# Patient Record
Sex: Male | Born: 1995 | Race: Black or African American | Marital: Single | Smoking: Never smoker
Health system: Northeastern US, Academic
[De-identification: ages and names within clinical notes are randomized; demographics above are authoritative.]

## PROBLEM LIST (undated history)

## (undated) DIAGNOSIS — K8020 Calculus of gallbladder without cholecystitis without obstruction: Secondary | ICD-10-CM

---
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
## 2016-01-17 ENCOUNTER — Other Ambulatory Visit: Payer: Self-pay

## 2016-01-17 MED ORDER — OXYCODONE HCL 10 MG PO TABS *I*
10.0000 mg | ORAL_TABLET | Freq: Two times a day (BID) | ORAL | 0 refills | Status: DC | PRN
Start: 2016-01-17 — End: 2016-02-16

## 2016-02-16 ENCOUNTER — Other Ambulatory Visit: Payer: Self-pay

## 2016-02-16 DIAGNOSIS — D571 Sickle-cell disease without crisis: Secondary | ICD-10-CM

## 2016-02-16 DIAGNOSIS — D564 Hereditary persistence of fetal hemoglobin [HPFH]: Secondary | ICD-10-CM

## 2016-02-16 MED ORDER — OXYCODONE HCL 10 MG PO TABS *I*
10.0000 mg | ORAL_TABLET | Freq: Two times a day (BID) | ORAL | 0 refills | Status: DC | PRN
Start: 2016-02-16 — End: 2016-03-14

## 2016-02-29 ENCOUNTER — Ambulatory Visit: Payer: Self-pay | Admitting: Pediatric Hematology and Oncology

## 2016-03-14 ENCOUNTER — Ambulatory Visit: Payer: Self-pay | Admitting: Pediatric Hematology and Oncology

## 2016-03-14 ENCOUNTER — Encounter: Payer: Self-pay | Admitting: Pediatric Hematology and Oncology

## 2016-03-14 VITALS — BP 116/75 | HR 60 | Temp 98.4°F | Resp 16 | Ht 73.07 in | Wt 196.4 lb

## 2016-03-14 DIAGNOSIS — D571 Sickle-cell disease without crisis: Secondary | ICD-10-CM

## 2016-03-14 MED ORDER — OXYCODONE HCL 10 MG PO TABS *I*
10.0000 mg | ORAL_TABLET | Freq: Two times a day (BID) | ORAL | 0 refills | Status: DC | PRN
Start: 2016-03-14 — End: 2016-04-11

## 2016-03-14 NOTE — Progress Notes (Addendum)
Pediatric Hematology/Oncology Clinic Visit     CC: 20 y.o. man with HbS-high F Sickle Cell Disease here for follow up of disease    HPI: Samwise III [name] is a 20 y.o. male with SCD (HbS-high F disease) who underwent cholecystectomy in 2014. He continues to have intermittent knee pain after activities for which that he has been taking ibuprofen and using hot packs first to see if it helps relieve the pain then resorts to oxycodone if those interventions do not work. He has been trying this new pain regimen the last 3 months and he states that it is working and helping his intermittent knee pain.  No constipation, fevers, or priapism.    ROS:  Const- no fevers  HEENT- no URI symptoms.   CV- no chest pain  Pulm- no cough or SOB  GI- No nausea, vomiting, diarrhea. 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  He just graduated from [employer]. He wants become an Art gallery managerengineer although he is not fond of math. Family history notable for brothers and sister with sickle cell disease.     Medication Sig Start Date   ibuprofen (ADVIL,MOTRIN) 600 MG tablet Take 1 tablet (600 mg total) by mouth 4 times daily as needed for Pain 04/18/15   oxyCODONE (ROXICODONE) 10 MG immediate release tablet Take 1 tablet (10 mg total) by mouth every 12 hours as needed for Pain   Max daily dose: 20 mg 04/18/15   folic acid (FOLVITE) 1 MG tablet Take 1 tablet (1 mg total) by mouth daily 03/17/15   clindamycin-benzoyl peroxide (BENZACLIN) gel Apply topically 2 times daily 08/16/14     Objective:  Vitals:   BP 116/75  Pulse 60  Temp 36.9 C (98.4 F) (Temporal)   Resp 16  Ht 185.6 cm  (6' 1.07")  Wt 89.1 kg (196 lb 6.9 oz)  SpO2 100%  BMI 25.87 kg/m2    ZOX:WRUEBMI:Body mass index is 25.87 kg/(m^2).  BMI Percentile:84 %ile (Z= 0.97) based on CDC 2-20 Years BMI-for-age data using vitals from 03/14/2016.  Weight Percentile: 92 %ile (Z= 1.42) based on CDC 2-20 Years weight-for-age data using vitals from 03/14/2016.  GENERAL: well appearing in no acute distress  HEENT: Normocephalic, atraumatic, extraocular movements intact, anicteric sclera, mucous membranes moist, no oral lesions  Neck: supple  CV: S1, S2 regular rate and rhythm without murmur  Resp: clear to auscultation bilaterally without wheezes, rales, rhonchi.  GI: Soft, nontender, nondistended, without organomegaly or masses.  GU: Tanner V male with descended testes bilaterally  Skin: no rash  Extremities warm, well perfused  MSK: normal bulk and tone    Laboratory Results: none today    Assessment/Plan: 20 y.o. boy with HbSS with high F with stable frequency of pain episodes. Pain may or may not be exacerbated by sickle cell disease. [place] to visit family.     1. Recurrent pain: has been using more NSAIDs for his knee pain which is helping with the  pain. He states he has been applying warm packs to his knee as well. Only uses the oxycodone if other two interventions do not work.     2. Sickle cell maintenance:   Received flu shot in September 2016 and immunizations are otherwise up to date.   Needs ophthalmology evaluation. Mom is agreeable to him being seen August/ September 2017.  Stressed that sickle retinopathy can lead to blindness unless caught early.    Discussed with mom for signs and symptoms to call hem/onc for.     Follow up in clinic in 3 months or sooner as needed.     Godfrey Pick, NP   Pediatric Hematology Oncology     Attending addendum:  I saw and examined the patient.  I agree with the findings as documented above.  I discussed the proposed plan with the FNP and the  family.   My additions are as follows: Raun is an 20yo man with HbSS with high fetal hemoglobin who presents in follow up. Mild symptoms with oxycodone use no more than 1 per day. Trip for [place] this summer after graduation. Hasn't decided where he wants to go for college. Still hasn't seen ophthalmology. Exam is reassuring. Discussed need for ophtho appt. Also discussed some differences in patient care in ED in pediatrics versus adult medicine. Mom to make appt for ophtho after their return from [place]. Refill for oxycodone provided. Stressed that if he goes to college out of town, I'd like to help him know where to access medical care if necessary. RTC 3 months at which time labs will be due.    Vista Deck, MD

## 2016-04-11 ENCOUNTER — Other Ambulatory Visit: Payer: Self-pay

## 2016-04-11 MED ORDER — OXYCODONE HCL 10 MG PO TABS *I*
10.0000 mg | ORAL_TABLET | Freq: Two times a day (BID) | ORAL | 0 refills | Status: DC | PRN
Start: 2016-04-11 — End: 2016-05-09

## 2016-04-18 ENCOUNTER — Ambulatory Visit: Payer: Self-pay | Admitting: Ophthalmology

## 2016-04-30 ENCOUNTER — Encounter: Payer: Self-pay | Admitting: Pediatric Endocrinology

## 2016-04-30 ENCOUNTER — Emergency Department
Admission: EM | Admit: 2016-04-30 | Disposition: A | Discharge status: Home / Self Care | Payer: Self-pay | Source: Ambulatory Visit | Attending: Pediatrics | Admitting: Pediatrics

## 2016-04-30 LAB — BASIC METABOLIC PANEL
Anion Gap: 13 (ref 7–16)
CO2: 25 mmol/L (ref 20–28)
Calcium: 9.4 mg/dL (ref 9.3–10.5)
Chloride: 101 mmol/L (ref 96–108)
Creatinine: 0.87 mg/dL (ref 0.50–1.00)
GFR,Black: 145 *
GFR,Caucasian: 125 *
Glucose: 105 mg/dL — ABNORMAL HIGH (ref 60–99)
Lab: 9 mg/dL (ref 6–20)
Potassium: 4 mmol/L (ref 3.3–5.1)
Sodium: 139 mmol/L (ref 133–145)

## 2016-04-30 LAB — CBC AND DIFFERENTIAL
Baso # K/uL: 0.1 10*3/uL (ref 0.0–0.1)
Basophil %: 0.7 %
Eos # K/uL: 0.3 10*3/uL (ref 0.0–0.5)
Eosinophil %: 3.5 %
Hematocrit: 31 % — ABNORMAL LOW (ref 40–51)
Hemoglobin: 10.9 g/dL — ABNORMAL LOW (ref 13.7–17.5)
IMM Granulocytes #: 0 10*3/uL (ref 0.0–0.1)
IMM Granulocytes: 0.2 %
Lymph # K/uL: 2.4 10*3/uL (ref 1.3–3.6)
Lymphocyte %: 28.5 %
MCH: 31 pg/cell (ref 26–32)
MCHC: 36 g/dL (ref 32–37)
MCV: 88 fL (ref 79–92)
Mono # K/uL: 0.7 10*3/uL (ref 0.3–0.8)
Monocyte %: 7.8 %
Neut # K/uL: 5 10*3/uL (ref 1.8–5.4)
Nucl RBC # K/uL: 0 10*3/uL (ref 0.0–0.0)
Nucl RBC %: 0 /100 WBC (ref 0.0–0.2)
Platelets: 233 10*3/uL (ref 150–330)
RBC: 3.5 MIL/uL — ABNORMAL LOW (ref 4.6–6.1)
RDW: 16.1 % — ABNORMAL HIGH (ref 11.6–14.4)
Seg Neut %: 59.3 %
WBC: 8.4 10*3/uL (ref 4.2–9.1)

## 2016-04-30 LAB — RUQ PANEL (ED ONLY)
ALT: 38 U/L (ref 0–50)
AST: 38 U/L (ref 0–50)
Albumin: 4.3 g/dL (ref 3.5–5.2)
Alk Phos: 75 U/L (ref 65–260)
Amylase: 91 U/L (ref 28–100)
Bili,Indirect: 1.8 mg/dL — ABNORMAL HIGH (ref 0.1–1.0)
Bilirubin,Direct: 0.4 mg/dL — ABNORMAL HIGH (ref 0.0–0.3)
Bilirubin,Total: 2.2 mg/dL — ABNORMAL HIGH (ref 0.0–1.2)
Lipase: 17 U/L (ref 13–60)
Total Protein: 8 g/dL — ABNORMAL HIGH (ref 6.3–7.7)

## 2016-04-30 LAB — RBC MORPHOLOGY

## 2016-04-30 LAB — BLOOD BANK HOLD LAVENDER

## 2016-04-30 LAB — RETICULOCYTES
Retic %: 5.5 % — ABNORMAL HIGH (ref 0.9–1.5)
Retic Abs: 193 10*3/uL — ABNORMAL HIGH (ref 41.6–65.1)

## 2016-04-30 LAB — BLOOD BANK HOLD RED

## 2016-04-30 MED ORDER — IBUPROFEN 200 MG PO TABS *I*
ORAL_TABLET | ORAL | Status: AC
Start: 2016-04-30 — End: 2016-04-30
  Administered 2016-04-30: 600 mg via ORAL
  Filled 2016-04-30: qty 3

## 2016-04-30 MED ORDER — IBUPROFEN 200 MG PO TABS *I*
600.0000 mg | ORAL_TABLET | Freq: Once | ORAL | Status: AC
Start: 2016-04-30 — End: 2016-04-30

## 2016-04-30 MED ORDER — HYDROMORPHONE HCL PF 1 MG/ML IJ SOLN *WRAPPED*
INTRAMUSCULAR | Status: AC
Start: 2016-04-30 — End: 2016-04-30
  Administered 2016-04-30: 1 mg via INTRAVENOUS
  Filled 2016-04-30: qty 1

## 2016-04-30 MED ORDER — SODIUM CHLORIDE 0.9 % IV BOLUS *I*
1000.0000 mL | Freq: Once | Status: AC
Start: 2016-04-30 — End: 2016-04-30
  Administered 2016-04-30: 1000 mL via INTRAVENOUS

## 2016-04-30 MED ORDER — D5W & 0.45% NACL IV SOLN *I*
100.0000 mL/h | INTRAVENOUS | Status: DC
Start: 2016-04-30 — End: 2016-04-30
  Administered 2016-04-30: 100 mL/h via INTRAVENOUS

## 2016-04-30 MED ORDER — HYDROMORPHONE HCL PF 1 MG/ML IJ SOLN *WRAPPED*
1.0000 mg | INTRAMUSCULAR | Status: DC | PRN
Start: 2016-04-30 — End: 2016-04-30
  Administered 2016-04-30: 1 mg via INTRAVENOUS
  Filled 2016-04-30: qty 1

## 2016-04-30 NOTE — ED Triage Notes (Signed)
Patient arrived to ED with mom with complaints of chest pain for past couple hours.Patient has PMH of sickle cell per mom.Patient took 10mg  oxycodone without relief.Patient remains tolerating PO well at home. Patient states he may have had fever but has not taken temperature. Patient in 8/10chest pain currently.  Patient alert and oriented x4, holding chest with chest pain, VSS, MMM  Patient placed on full cardiac monitoring-NSR  SPO2- 99%RA       Triage Note   Flonnie OvermanNicholas Saahas Hidrogo, RN

## 2016-04-30 NOTE — Discharge Instructions (Signed)
Your child was seen in the emergency department for chest pain. His pain was controlled with IV pain medication and his labs were reassuring that he did not require hospitalization. He was discharged to home.    Please contact his hematology/oncology clinic to inquire about whether he needs to be seen for a follow up appointment.    He can take his oxycodone as prescribed if his pain returns. You can also take ibuprofen  every 6 hours.    Return to the ED for evaluation if your pain is not controlled by the above medications, if you have fever, or if you have any shortness of breath.

## 2016-04-30 NOTE — ED Provider Notes (Addendum)
History     Chief Complaint   Patient presents with    Chest Pain    Sickle Cell Pain Crisis     HPI Comments: Cameron [name] is a 20 y.o. male with history of sickle cell disease with HPFH, who presents with chest pain.  He states that he has had increasing chest pain over the past 2 days, which is currently 8/10 in severity, sharp in character, located over his mid chest and present also in the middle of his back.  His pain is increased with moving and twisting, he has taken oxycodone 10 mg at home without improvement.  He denies shortness of breath, lightheadedness, abdominal pain.  He denies recent fever, cough, nasal congestion, rhinorrhea, sore throat, ear pain, headache, shortness of breath, nausea, vomiting, diarrhea, new rash.  He has been sedentary yesterday and today due to his pain, previously he was not engaged in any particularly strenuous activity and has been eating and drinking normally.       History provided by:  Patient, parent and medical records  Language interpreter used: No    Is this ED visit related to civilian activity for income:  Not work related    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        Past Surgical History:   Procedure Laterality Date    CHOLECYSTECTOMY       Family History   Problem Relation Age of Onset    Other Mother     Sickle cell anemia Sister     Sickle cell anemia Brother     Sickle cell anemia Brother        Social History    reports that he has never smoked. He has never used smokeless tobacco. He reports that he does not drink alcohol or use illicit drugs. His sexual activity history is not on file.    Living Situation     Questions Responses    Patient lives with Family    Homeless No    Caregiver for other family member     External Services     Employment     Domestic Violence Risk           Problem List     Patient Active Problem List   Diagnosis Code    Sickle cell disease with HPFH  D57.80    Back pain M54.9       Review of Systems   Review of Systems   Constitutional: Negative for fever.   HENT: Negative for congestion and sore throat.    Eyes: Negative for visual disturbance.   Respiratory: Negative for cough and shortness of breath.    Cardiovascular: Positive for chest pain.   Gastrointestinal: Negative for abdominal pain, diarrhea, nausea and vomiting.   Genitourinary: Negative for dysuria and hematuria.   Musculoskeletal: Negative for neck pain and neck stiffness.   Skin: Negative for rash.   Allergic/Immunologic: Positive for immunocompromised state (sickle cell disease).   Neurological: Negative for syncope.       Physical Exam     ED Triage Vitals   BP Heart Rate Heart Rate (via Pulse Ox) Resp Temp Temp src SpO2 O2 Device O2 Flow Rate   04/30/16 0018 04/30/16 0018 04/30/16 0040 04/30/16 0018 04/30/16 0018 04/30/16 0018 04/30/16 0018 04/30/16 0018 --   133/76 87 83 18 36.5 C (97.7 F) Oral 99 % None (Room  air)       Weight           04/30/16 0018           92.8 kg (204 lb 9.4 oz)                    Physical Exam   Constitutional: He is oriented to person, place, and time. He appears well-developed and well-nourished. No distress.   HENT:   Head: Normocephalic.   Mouth/Throat: Oropharynx is clear and moist.   Eyes: Conjunctivae are normal. No scleral icterus.   Neck: Normal range of motion.   Cardiovascular: Normal rate, regular rhythm, normal heart sounds and intact distal pulses.  Exam reveals no gallop and no friction rub.    No murmur heard.  Pulmonary/Chest: Effort normal and breath sounds normal. No respiratory distress. He has no wheezes. He has no rales. He exhibits tenderness (diffuse).   Abdominal: Soft. Bowel sounds are normal. There is no tenderness.   Musculoskeletal: Normal range of motion.   Neurological: He is alert and oriented to person, place, and time.   Skin: Skin is warm and dry. He is not diaphoretic.   Psychiatric: He has a normal mood and affect. His behavior  is normal.   Nursing note and vitals reviewed.      Medical Decision Making        Initial Evaluation:  ED First Provider Contact     Date/Time Event User Comments    04/30/16 0022 ED Provider First Contact Raj Janus Initial Face to Face Provider Contact          Patient seen by me on arrival date of 04/30/2016 at at time of arrival  12:10 AM.  Initial face to face evaluation time noted above may be discrepant due to patient acuity and delay in documentation.    Assessment:  19 y.o.male comes to the ED with chest pain. He  is afebrile, hemodynamically stable and in no acute distress. He is not hypoxic, has no dyspnea. Likelihood of acute chest syndrome is very low. His pain reproduces with palpation of the chest wall and is likely due to vaso-occlusion. He has no identifiable precipitating cause. We will obtain labs and CXR, and treat his pain. Disposition will be determined depending on his laboratory and imaging workup as well as his response to therapy.    Differential Diagnosis includes vaso-occlusive crisis, acute chest syndrome, pneumonia, pneumothorax, musculoskeletal pain, trauma    Plan:   Orders Placed This Encounter   Procedures    *Chest standard frontal and lateral views    CBC and differential    Reticulocytes    Blood bank hold red    Blood bank hold lavender    Basic metabolic panel    RUQ panel (ED only)    RBC morphology    [facility] telemetry continuous until discontinued    Insert peripheral IV   Dispo pending results of workup and response to therapy    Henderson Baltimore, MD    Henderson Baltimore, MD  Resident  04/30/16 0246  Resident Attestation:     Patient seen by me today, 04/30/2016 at 00:29am.    History:   I reviewed this patient, reviewed the resident's note and agree.  Exam:   I examined this patient, reviewed the resident's note and agree.    Decision Making:   I discussed with the resident his/her documented decision making  and agree.    Will provide IV  hydration, IV pain  control, also Ibuprofen.        Author Markus DaftElizabeth Melyna Huron, DO       Markus DaftMurray, Jadien Lehigh, [place]DO  04/30/16 [phone]

## 2016-04-30 NOTE — ED Notes (Addendum)
Pt presents to ED with mom for chest pain that began around 1500. Pt has history of sickle cell and was last hospitalized 2 years ago. Pt states when the pain began he took a nap and woke up and the pain was worse. Pt mother gave him 10mg  of oxycodone with no effect he was able to eat and drink regularly. Pt states he has headache and had 8/10 chest pain before the dilaudid, and now has chest pain of 5/10. Pt denies any difficulty breathing, nausea or vomiting. Pt appears to be in pain and fatigued while sitting in bed. Lung sounds are CTA and abdomen is flat, non-distended and non-tender. See nursing assessment for full review of systems.     I, Vladimir CroftsAlyssa Marshella Tello RN, agree with the above documentation, pt on continuous telemetry and oxymetry.   Plan: medications, labs, imaging per orders, education as needed, comfort measures, writer will continue to treat per provider orders.

## 2016-04-30 NOTE — ED Provider Progress Notes (Signed)
ED Provider Progress Note    ED Course   Comment By Time   Patient reassessed, endorses no respiratory problems.  He states that his pain had improved after his first Dilaudid dose, but has since increased back to 8/10.  We will provide another dose of Dilaudid and provide ibuprofen as well, reassess. Henderson BaltimoreJesson, Raylyn Speckman Ronald, MD 08/07 0247   Pain currently 4/10, no dyspnea. Will start maintenance fluids and observe, allow patient to rest. Henderson BaltimoreJesson, Fredrika Canby Ronald, MD 08/07 [phone]     Patient signed out to overnight team pending reassessment.       Henderson BaltimoreAndrew Ronald Taelyn Nemes, MD, 04/30/2016, 2:46 AM         Henderson BaltimoreJesson, Karley Pho Ronald, MD  Resident  04/30/16 [phone]

## 2016-05-09 ENCOUNTER — Other Ambulatory Visit: Payer: Self-pay

## 2016-05-09 MED ORDER — OXYCODONE HCL 10 MG PO TABS *I*
10.0000 mg | ORAL_TABLET | Freq: Two times a day (BID) | ORAL | 0 refills | Status: DC | PRN
Start: 2016-05-09 — End: 2016-06-08

## 2016-05-10 ENCOUNTER — Other Ambulatory Visit: Payer: Self-pay

## 2016-05-10 MED ORDER — IBUPROFEN 600 MG PO TABS *I*
600.0000 mg | ORAL_TABLET | Freq: Four times a day (QID) | ORAL | 3 refills | Status: DC | PRN
Start: 2016-05-10 — End: 2017-01-02

## 2016-06-08 ENCOUNTER — Other Ambulatory Visit: Payer: Self-pay

## 2016-06-08 MED ORDER — OXYCODONE HCL 10 MG PO TABS *I*
10.0000 mg | ORAL_TABLET | Freq: Two times a day (BID) | ORAL | 0 refills | Status: DC | PRN
Start: 2016-06-08 — End: 2016-07-11

## 2016-06-13 ENCOUNTER — Ambulatory Visit: Payer: Self-pay | Admitting: Pediatric Hematology and Oncology

## 2016-07-11 ENCOUNTER — Other Ambulatory Visit: Payer: Self-pay

## 2016-07-11 MED ORDER — OXYCODONE HCL 10 MG PO TABS *I*
10.0000 mg | ORAL_TABLET | Freq: Two times a day (BID) | ORAL | 0 refills | Status: DC | PRN
Start: 2016-07-11 — End: 2016-08-03

## 2016-07-25 ENCOUNTER — Ambulatory Visit: Payer: Self-pay | Admitting: Pediatric Hematology and Oncology

## 2016-08-03 ENCOUNTER — Other Ambulatory Visit: Payer: Self-pay

## 2016-08-03 MED ORDER — OXYCODONE HCL 10 MG PO TABS *I*
10.0000 mg | ORAL_TABLET | Freq: Two times a day (BID) | ORAL | 0 refills | Status: DC | PRN
Start: 2016-08-03 — End: 2016-09-07

## 2016-08-08 ENCOUNTER — Ambulatory Visit: Payer: Self-pay | Admitting: Pediatric Hematology and Oncology

## 2016-09-07 ENCOUNTER — Encounter: Payer: Self-pay | Admitting: Pediatric Hematology and Oncology

## 2016-09-07 ENCOUNTER — Ambulatory Visit: Payer: Self-pay | Admitting: Pediatric Hematology and Oncology

## 2016-09-07 VITALS — BP 129/81 | HR 112 | Temp 97.9°F | Resp 18 | Ht 72.52 in | Wt 186.9 lb

## 2016-09-07 DIAGNOSIS — D571 Sickle-cell disease without crisis: Secondary | ICD-10-CM

## 2016-09-07 DIAGNOSIS — D564 Hereditary persistence of fetal hemoglobin [HPFH]: Secondary | ICD-10-CM

## 2016-09-07 MED ORDER — OXYCODONE HCL 10 MG PO TABS *I*
10.0000 mg | ORAL_TABLET | Freq: Two times a day (BID) | ORAL | 0 refills | Status: DC | PRN
Start: 2016-09-07 — End: 2016-10-02

## 2016-09-07 MED ORDER — DIGITAL THERMOMETER MISC
0 refills | Status: DC
Start: 2016-09-07 — End: 2018-04-29

## 2016-09-07 MED ORDER — FOLIC ACID 1 MG PO TABS *I*
1.0000 mg | ORAL_TABLET | Freq: Every day | ORAL | 5 refills | Status: DC
Start: 2016-09-07 — End: 2017-02-11

## 2016-09-07 MED ORDER — DEXTROMETHORPHAN-GUAIFENESIN 30-600 MG PO TB12 *I*
1.0000 | ORAL_TABLET | Freq: Two times a day (BID) | ORAL | 0 refills | Status: DC | PRN
Start: 2016-09-07 — End: 2017-02-11

## 2016-09-07 NOTE — Progress Notes (Signed)
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 male with SCD (HbS-high F disease) who underwent cholecystectomy in 2014. He is recovering from a febrile illness with cough, vomiting, diarrhea. He no longer has fever or GI symptoms but has a lingering cough. He is drinking water and ginger ale. No recent pain crises.    ROS:  Const- no fevers  HEENT- no URI symptoms.   CV- no chest pain  Pulm- +cough but no SOB   GI- No nausea, vomiting, diarrhea. No abd pain at this time  GU- no change in UOP or dark urine, no priapism  Skin- no rashes or lesions  Msk- no muscle pain  Neuro- no headaches  Heme- no bruising or bleeding    Updated PMHx:  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     Past medical and surgical history, family and social history were reviewed and updated.  Currently working at [employer]. Family history notable for brothers and sister with sickle cell disease.     Medication Sig Start Date   folic acid (FOLVITE) 1 MG tablet Take 1 tablet (1 mg total) by mouth daily 09/07/16   oxyCODONE (ROXICODONE) 10 MG immediate release tablet Take 1 tablet (10 mg total) by mouth every 12 hours as needed for Pain   Max daily dose: 20 mg 09/07/16   ibuprofen (ADVIL,MOTRIN) 600 MG tablet Take 1 tablet (600 mg total) by mouth 4 times daily as needed for Pain 05/10/16   clindamycin-benzoyl peroxide (BENZACLIN) gel Apply topically 2 times daily 06/09/15       Objective:  Vitals:   BP 129/81   Pulse (!) 112   Temp 36.6 C (97.9 F) (Temporal)    Resp 18   Ht 184.2 cm (6' 0.52")   Wt 84.8 kg (186 lb 15.2 oz)   SpO2 100%   BMI 24.99 kg/m2    VQQ:VZDG mass index is 24.99 kg/(m^2).  BMI Percentile:75 %ile (Z= 0.69) based on CDC 2-20 Years BMI-for-age data using vitals from 09/07/2016.  Weight Percentile: 87 %ile (Z= 1.13) based on CDC  2-20 Years weight-for-age data using vitals from 09/07/2016.  GENERAL: well appearing in no acute distress  HEENT: Normocephalic, atraumatic, extraocular movements intact, anicteric sclera, mucous membranes moist, no oral lesions  Neck: supple  CV: S1, S2 regular rate and rhythm without murmur  Resp: clear to auscultation bilaterally without wheezes, rales, rhonchi.  GI: Soft, nontender, nondistended, without organomegaly or masses.  Skin: no rash  Extremities warm, well perfused  MSK: normal bulk and tone    Laboratory Results:       Lab results: 04/30/16  0041   WBC 8.4   Hemoglobin 10.9*   Hematocrit 31*   RBC 3.5*   Platelets 233   Seg Neut % 59.3   Lymphocyte % 28.5   Monocyte % 7.8   Eosinophil % 3.5   Basophil % 0.7     Lab Results   Component Value Date    RETAP 5.5 (H) 04/30/2016         Lab results: 04/30/16  0041   Sodium 139   Potassium 4.0   Chloride 101   CO2 25   UN 9   Creatinine 0.87   GFR,Caucasian 125  GFR,Black 145   Glucose 105*   Calcium 9.4   Total Protein 8.0*   Albumin 4.3   ALT 38   AST 38   Alk Phos 75   Bilirubin,Total 2.2*     Assessment/Plan: 20 y.o. boy with HbSS with high F here in follow up. URI. Tachycardia.  1. Intermittent VOC: Refilled oxycodone. Encouraged use of ibuprofen first for mild pain.  2. Transition: Discussed sickle cell inheritance, reviewed his baseline hemoglobin and type of sickle cell disease he has, as well as reviewed differences between adult and pediatric ED sickle cell care. He feels ready to transition to [facility] and will await a call from [facility] staff. In the interim, I will refill his meds and deal with acute sickle cell issues as they arise.  3. Sickle cell: Refilled folic acid. Very overdue for ophthalmology exam. [facility] staff may have better success with coordinating ophthalmology than I have. Flu shot given today.   4. Viral illness: May have mild dehydration from illness earlier this week - recommended fluids. Prescribed Mucinex but cautioned  him to call us if he has fevers. Prescribed thermometer as well.     Orlena Sheldon, MD

## 2016-09-19 ENCOUNTER — Telehealth: Payer: Self-pay | Admitting: Primary Care

## 2016-09-19 NOTE — Telephone Encounter (Signed)
Please call Jagjit to schedule a NPV in the next few months - transitioninng from peds heme onc - with me around spring

## 2016-09-25 NOTE — Telephone Encounter (Signed)
Patient scheduled on 4/10 at 2:30

## 2016-10-02 ENCOUNTER — Other Ambulatory Visit: Payer: Self-pay

## 2016-10-02 MED ORDER — OXYCODONE HCL 10 MG PO TABS *I*
10.0000 mg | ORAL_TABLET | Freq: Two times a day (BID) | ORAL | 0 refills | Status: DC | PRN
Start: 2016-10-02 — End: 2016-11-05

## 2016-10-05 ENCOUNTER — Telehealth: Payer: Self-pay | Admitting: Pediatrics

## 2016-10-05 NOTE — Telephone Encounter (Signed)
Pt scheduled for NPV with Dr. Concha NorwayPulcino on 4/10 at [facility].

## 2016-11-05 ENCOUNTER — Other Ambulatory Visit: Payer: Self-pay

## 2016-11-05 MED ORDER — OXYCODONE HCL 10 MG PO TABS *I*
10.0000 mg | ORAL_TABLET | Freq: Two times a day (BID) | ORAL | 0 refills | Status: DC | PRN
Start: 2016-11-05 — End: 2016-12-04

## 2016-12-04 ENCOUNTER — Other Ambulatory Visit: Payer: Self-pay

## 2016-12-04 MED ORDER — OXYCODONE HCL 10 MG PO TABS *I*
10.0000 mg | ORAL_TABLET | Freq: Two times a day (BID) | ORAL | 0 refills | Status: DC | PRN
Start: 2016-12-04 — End: 2017-01-01

## 2016-12-04 NOTE — Telephone Encounter (Signed)
This report was requested by: Lenell AntuJennifer Leigh Riki Gehring   Reference #: [id number]   Others' Prescriptions  Patient Name: Cameron BachelorLucius Inocencio Birth Date: 03-31-1996   Address: [address] [place], [place] [id number] Sex: Male   Rx Written Rx Dispensed Drug Quantity Days Supply Prescriber Name Payment Method Dispenser   11/05/2016 11/06/2016 oxycodone hcl 10 mg tablet  30 15 Diannia RuderNoronha, Suzie A (MD) [facility]   10/02/2016 10/02/2016 oxycodone hcl 10 mg tablet  30 15 Diannia RuderNoronha, Suzie A (MD) [facility]   09/07/2016 09/09/2016 oxycodone hcl 10 mg tablet  30 15 Diannia RuderNoronha, Suzie A (MD) [facility]

## 2016-12-07 ENCOUNTER — Ambulatory Visit: Payer: Self-pay | Admitting: Pediatric Hematology and Oncology

## 2017-01-01 ENCOUNTER — Other Ambulatory Visit: Payer: Self-pay

## 2017-01-01 ENCOUNTER — Ambulatory Visit: Payer: Medicaid Other | Attending: Primary Care | Admitting: Primary Care

## 2017-01-01 ENCOUNTER — Encounter: Payer: Self-pay | Admitting: Primary Care

## 2017-01-01 ENCOUNTER — Telehealth: Payer: Self-pay

## 2017-01-01 VITALS — BP 120/78 | HR 107 | Ht 72.52 in | Wt 190.3 lb

## 2017-01-01 DIAGNOSIS — D571 Sickle-cell disease without crisis: Secondary | ICD-10-CM

## 2017-01-01 DIAGNOSIS — M549 Dorsalgia, unspecified: Secondary | ICD-10-CM

## 2017-01-01 MED ORDER — OXYCODONE HCL 10 MG PO TABS *I*
10.0000 mg | ORAL_TABLET | Freq: Every day | ORAL | 0 refills | Status: DC
Start: 2017-01-01 — End: 2017-01-23

## 2017-01-01 NOTE — Progress Notes (Signed)
Family:  3 brothers 20 yo ([place]), 7 yo Salah Nakamura), 20 yo

## 2017-01-01 NOTE — Telephone Encounter (Signed)
Writer set-up transportation for patient via [employer].   Invoice #[id number].   Patient advised of pick up scheduled for 4/10 at 1:30 PM.

## 2017-01-02 ENCOUNTER — Telehealth: Payer: Self-pay | Admitting: Primary Care

## 2017-01-02 ENCOUNTER — Other Ambulatory Visit: Payer: Self-pay

## 2017-01-02 ENCOUNTER — Telehealth: Payer: Self-pay

## 2017-01-02 MED ORDER — IBUPROFEN 600 MG PO TABS *I*
600.0000 mg | ORAL_TABLET | Freq: Four times a day (QID) | ORAL | 3 refills | Status: DC | PRN
Start: 2017-01-02 — End: 2017-02-11

## 2017-01-02 MED ORDER — CLINDAMYCIN PHOS-BENZOYL PEROX 1-5 % EX GEL *A*
Freq: Two times a day (BID) | CUTANEOUS | 5 refills | Status: DC
Start: 2017-01-02 — End: 2017-04-30

## 2017-01-02 NOTE — Telephone Encounter (Signed)
PA for Oxycodone 10 mg tablets approved beginning 01/02/2017 through 04/03/2017. Pharmacy notified and script run successfully/ with a $0 co-pay.

## 2017-01-02 NOTE — Telephone Encounter (Signed)
Writer called and spoke with pt to let him know that he will need to have Dr Concha Norway order his medications from now on per Dr Valla Leaver. Pt mom called and requested medication refill yesterday from Dr Valla Leaver but pt saw Dr Concha Norway yesterday.      Pete Pelt, RN

## 2017-01-06 ENCOUNTER — Encounter: Payer: Self-pay | Admitting: Primary Care

## 2017-01-06 NOTE — Progress Notes (Signed)
[facility]  [address]  [place] [address]  Phone: [phone]  Fax: [phone]    REASON FOR VISIT   Sickle Cell Health Maintenance Visit     PRIMARY DIAGNOSIS   Sickle Cell disease, type HgSS    SUBJECTIVE   Current concerns:  None - Cameron [name] describes himself as generally healthy with 1-2 admissions per year and most recently underwent cholecystectomy   He is taking oxy IR for management of back and leg pain although this has not bothered him in some time    He has never been on HU and has not heard of the medication  Enjoys time with his borthers and family in free time - likes being with them     Social Determinants of Health:  Cameron [name] lives with his mother, sister and brother (nephew age 70 (sister's son) and 30 yo and 64 yo bothers ) in single family home. When feeling sick, he relies on his  Mother and brother for support. [name] accesses his pharmacy by walking and transport. 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      Current home pain management plan:  Non pharmacologic interventions: ibuprofen he identifies as taking prn for headaches only   Non opioid pain modifying medications: none  Short acting Opioid pain medications: oxy IR 10 mg Q 12 hr - he reports that was what he was told to take but has had no pain in back or legs in several weeks - feels he is taking them routinely "just in case"  Long acting Opioid pain medications: none    Current Pain score:   Pain    01/01/17 1426   PainSc:   0 - No pain     Emergency Room visits in last 6 month: 0  Hospitalizations in last 6 mo: 0    Current disease modifying therapy:none - high HgF witout medication tx    Medications Reviewed and changes     Current Outpatient  Prescriptions:     oxyCODONE (ROXICODONE) 10 MG immediate release tablet, Take 1 tablet (10 mg total) by mouth daily   Max daily dose: 10 mg, Disp: 14 tablet, Rfl: 0    folic acid (FOLVITE) 1 MG tablet, Take 1 tablet (1 mg total) by mouth daily, Disp: 30 tablet, Rfl: 5    dextromethorphan-guaifenesin (MUCINEX DM) 30-600 MG per 12 hr tablet, Take 1 tablet by mouth 2 times daily as needed for Congestion or Cough, Disp: 15 tablet, Rfl: 0    Electronic Thermometer (DIGITAL THERMOMETER) MISC, Use as directed, Disp: 1 each, Rfl: 0    ibuprofen (ADVIL,MOTRIN) 600 MG tablet, Take 1 tablet (600 mg total) by mouth 4 times daily as needed for Pain, Disp: 100 tablet, Rfl: 3    clindamycin-benzoyl peroxide (BENZACLIN) gel, Apply topically 2 times daily, Disp: 25 g, Rfl: 5  Review of Systems   Constitutional: Negative for activity change, appetite change, fatigue and fever.   Respiratory: Negative for cough, chest tightness and shortness of breath.    Gastrointestinal: Negative for constipation, nausea and vomiting.   Neurological: Negative for dizziness.   Psychiatric/Behavioral: Negative for dysphoric mood and sleep disturbance.     OBJECTIVE     BP 120/78  Pulse 107   Ht 1.842 m (6' 0.52")   Wt 86.3 kg (190 lb 4.8 oz)   SpO2 100%   BMI 25.44 kg/m2  Physical Exam   Constitutional: He appears well-developed and well-nourished. No distress.   Psychiatric: He has a normal mood and affect. His behavior is normal. Judgment and thought content normal.     ASSESSMENT / DIAGNOSIS   Augusta was seen today for new patient visit. To establish care with underlying dx of sickle cell disease with unique genetics of Hg SS with high HgF likely resulting in his fairly mild clinical course to date.  Good social supports and engaged in school, sports, and employment    Disease modifying therapy:  No inidcation for HU given underlying elevated HgF at 31% IN 2014.  Reviewed this with Elon and explained the impact of this elevation on  clinical sx     Optho:  Never has had eye screening, referral placed and epxained impact of SCD on eye    Iron overload: no history of blood transfusion in lifetime  start monitoring if required blood transfusion    AVN:  Current joint sx: none, some intermittant joinnt pain but denies hip, knee shoulder pain - discussed need for screening with x ray if finding recurrent crisis pain in these areas and discussed what AVN is and implications    Renal screening:  Last urine protein: No results found for: UTPR    Pulmonary screening: none, asymtpomatic     Cardiac Screening:  Current symptoms present of CHF: no  Last Echocardiogram:  none    Infection management plan:  - patient is functionally asplenic yes  -patient reminded of risk of infection given asplenia  -reminded to seek medical assistance immediately for fever (>38.3C or 100.39F )  -advised to call our clinic, PCP or other urgent medical care  -should be evaluated within 4 hours of fever    Immunizations:   Sickle Cell Immunizations     Name Date    MENINGOCOCCAL (Bexsero) Oct 26, 16    MENINGOCOCCAL (Bexsero) Sep 15, 16    MENINGOCOCCAL [facility]) Aug 19, 11 12:00 AM    MENINGOCOCCAL [facility]) Aug 12, 10 12:00 AM    MENINGOCOCCAL CONJUGATE MCV4P Jan 29, 16    MENINGOCOCCAL Polysaccharide Jul 25, 01 12:00 AM    PNEUMOCOCCAL 13(PREVNAR 13) Nov 23, 15    PNEUMOCOCCAL(PNEUMOVAX) Sep 15, 16    PNEUMOCOCCAL(PNEUMOVAX) Nov 28, 01          Oral Health:  Dental Exam every 6 months: last exam  Wisdom teeth status:    Ocular screening:  Last eye exam:   Findings:    Reproductive health:  Male:  HIV screening age 16-65   HIV 1&2 ANTIGEN/ANTIBODY   Date/Time Value Ref Range Status   06/09/2015 04:18 PM Nonreactive  Final     Comment:     Test Method: CMIA     Last CT screen:   Chlamydia Plasmid DNA Amplification   Date/Time Value Ref Range Status   06/09/2015 04:18 PM .  Final   No results found for: CLAMYDCU  Last GC screen: No results found for: G      Mental  Health:  Current BH screening:  Recent Review Flowsheet Data     PHQ-2/9 Scores 01/01/2017    PHQ9 Calculated Score 0        Referral made to behavioral health: No: no inidcation at this point, reviewed availability at [facility]    Pain  management plan of care:  Benefit outweighs Risk of opioid pain medications: no    Opportunity to wean opioid: yes  Agreed upon Treatment goals include:  Utilize when having pain only to avoid toleratence as able, taper Oxy IR to daily and then off and continue prn only      Urine Toxicology most recently collected on: none    No Patient Care Coordination Note on file.        Return in about 2 weeks (around 01/15/2017) for f/u pain med taper .    Electronically signed by Benay Pillow, MD 01/01/2017 1:57 PM  [facility], Phone: [phone]

## 2017-01-11 ENCOUNTER — Telehealth: Payer: Self-pay

## 2017-01-11 NOTE — Telephone Encounter (Signed)
[facility] has left two vm for pt to call them to schedule an appt.    [facility] [phone].

## 2017-01-23 ENCOUNTER — Telehealth: Payer: Self-pay | Admitting: Primary Care

## 2017-01-23 ENCOUNTER — Encounter: Payer: Self-pay | Admitting: Primary Care

## 2017-01-23 ENCOUNTER — Ambulatory Visit: Payer: Medicaid Other | Attending: Primary Care | Admitting: Primary Care

## 2017-01-23 VITALS — BP 132/66 | HR 120 | Ht 72.52 in | Wt 190.3 lb

## 2017-01-23 DIAGNOSIS — D564 Hereditary persistence of fetal hemoglobin [HPFH]: Secondary | ICD-10-CM

## 2017-01-23 DIAGNOSIS — D5700 Hb-SS disease with crisis, unspecified: Secondary | ICD-10-CM

## 2017-01-23 DIAGNOSIS — M549 Dorsalgia, unspecified: Secondary | ICD-10-CM

## 2017-01-23 MED ORDER — NALOXONE HCL 4 MG/0.1ML NA LIQD *I*
NASAL | 5 refills | Status: DC
Start: 2017-01-23 — End: 2023-10-07

## 2017-01-23 MED ORDER — OXYCODONE HCL 10 MG PO TABS *I*
10.0000 mg | ORAL_TABLET | ORAL | 0 refills | Status: DC | PRN
Start: 2017-01-23 — End: 2017-02-22

## 2017-01-23 NOTE — Progress Notes (Deleted)
[place]  [place] [address]  Phone: [phone]  Fax: [phone]    REASON FOR VISIT   Sickle Cell Health Maintenance Visit     PRIMARY DIAGNOSIS   Sickle Cell disease, type HgSS    SUBJECTIVE   Current concerns:  Last week was taking twice a day   Social Determinants of Health:  Social Determinants of Care    Current home pain management plan:  Non pharmacologic interventions: ***   Non opioid pain modifying medications: *** mg  Q *** hr  Short acting Opioid pain medications: oxy IR 10 mg QAM  Long acting Opioid pain medications: *** mg Q *** hr    PEG:  Prior (PEG) Pain Score:   Current Pain score:   Pain    01/23/17 1350   PainSc:   0 - No pain     Pain on average the last week: {gen number 0-27:253664}  Degree of interference with Enjoyment of life in last week: {gen number 4-03:474259}  How has pain interfered with your General activity in the last week: {gen number 5-63:875643}  PEG SCORE ***    Emergency Room visits since last visit: {gen number 3-29:518841}  Hospitalizations since last visit: {gen number 6-60:630160}    Current disease modifying therapy:{CCC Sickle Cell Dis Mod Therapy:28945}    Medications Reviewed and changes     Current Outpatient Prescriptions:     ibuprofen (ADVIL,MOTRIN) 600 MG tablet, Take 1 tablet (600 mg total) by mouth 4 times daily as needed for Pain, Disp: 100 tablet, Rfl: 3    clindamycin-benzoyl peroxide (BENZACLIN) gel, Apply topically 2 times daily, Disp: 25 g, Rfl: 5    oxyCODONE (ROXICODONE) 10 MG immediate release tablet, Take 1 tablet (10 mg total) by mouth daily   Max daily dose: 10 mg, Disp: 14 tablet, Rfl: 0    folic acid (FOLVITE) 1 MG tablet, Take 1 tablet (1 mg total) by mouth daily, Disp: 30 tablet, Rfl: 5    dextromethorphan-guaifenesin (MUCINEX DM) 30-600 MG per 12 hr tablet, Take 1 tablet by mouth 2 times daily as needed for Congestion or Cough, Disp: 15 tablet, Rfl: 0    Electronic Thermometer (DIGITAL  THERMOMETER) MISC, Use as directed, Disp: 1 each, Rfl: 0  Review of Systems  OBJECTIVE     BP 132/66   Pulse (!) 120   Ht 1.842 m (6' 0.52")   Wt 86.3 kg (190 lb 4.8 oz)   SpO2 93%   BMI 25.44 kg/m2  Physical Exam  ASSESSMENT / DIAGNOSIS   There are no diagnoses linked to this encounter.  Disease modifying therapy:  {CCC Sickle Cell Disease Modifying Therapy:29087}  {Hydroxyurea Discontinuation:29519}    Iron overload:       {CCC SC Iron/Ferriton Monitoring:29088}  Last echo:    Last LFT:    Lab results: 04/30/16  0041   Total Protein 8.0*   Albumin 4.3   ALT 38   AST 38   Alk Phos 75   Bilirubin,Total 2.2*   Bilirubin,Direct 0.4*       Last hepatic MRI: No procedure found.    AVN:  Current joint sx:  Joint involvement on imaging:{Location; Avascular Necrosis:29510}  Last imaging evaluation of involved joints:    Renal screening:  Last urine protein: No results found for: UTPR    Pulmonary screening:     Supplemental O2?  SpO2: 93 %  Heart Rate: (!) 120  Baseline FEV1:  Baseline FVC:    Current spirometry results:   Current respiratory status:     Cardiac Screening:  Current symptoms present of CHF: {yes no:314532}  Last Echocardiogram:   Findings:{DESC; Sickle Cell Cardiac:29516}      Infection management plan:  - patient is functionally asplenic {yes no:314532}  -patient reminded of risk of infection given asplenia  -reminded to seek medical assistance immediately for fever (>38.3C or 100.43F )  -advised to call our clinic, PCP or other urgent medical care  -should be evaluated within 4 hours of fever    Immunizations:   Sickle Cell Immunizations     Name Date    MENINGOCOCCAL (Bexsero) Oct 26, 16    MENINGOCOCCAL (Bexsero) Sep 15, 16    MENINGOCOCCAL [facility]) Aug 19, 11 12:00 AM    MENINGOCOCCAL [facility]) Aug 12, 10 12:00 AM    MENINGOCOCCAL CONJUGATE MCV4P Jan 29, 16    MENINGOCOCCAL Polysaccharide Jul 25, 01 12:00 AM    PNEUMOCOCCAL 13(PREVNAR 13) Nov 23, 15    PNEUMOCOCCAL(PNEUMOVAX) Sep 15, 16     PNEUMOCOCCAL(PNEUMOVAX) Nov 28, 01          Oral Health:  Dental Exam every 6 months: last exam  Wisdom teeth status:    Ocular screening:  Last eye exam:   Findings:    Reproductive health:  Male:  HIV screening age 63-65   HIV 1&2 ANTIGEN/ANTIBODY   Date/Time Value Ref Range Status   06/09/2015 04:18 PM Nonreactive  Final     Comment:     Test Method: CMIA     Last CT screen:   Chlamydia Plasmid DNA Amplification   Date/Time Value Ref Range Status   06/09/2015 04:18 PM .  Final   No results found for: CLAMYDCU  Last GC screen: No results found for: G      Mental Health:  Current BH screening:  Recent Review Flowsheet Data     PHQ-2/9 Scores 01/01/2017    PHQ9 Calculated Score 0        _0 @  Referral made to behavioral health: {yes no:315493::"Yes"}    Pain management plan of care:  Benefit outweighs Risk of opioid pain medications: ***   Opportunity to wean opioid: ***  Agreed upon Treatment goals include:  ***     Urine Toxicology most recently collected on: ***  Urine toxicology results:      Comment:  ***  Collected today: {yes no:314532}    No Patient Care Coordination Note on file.      Patient Instructions    Find a pharmacy in [place] close to school when you go back in July     Make contact with the office for people with disabilities at [facility] to let them know about your sickle cell disease and pick up forms for me to fill out     Bring a physical form for [facility] to have Dr. Wilhelmenia Blase fill out at your next appointment     Take you medication with you out of the house in a perscription bottle     Buy a lock box for your dorm room at [facility] for mediction     Take your Oxy IR only if you need it for pain     Your Eye appointment is May 25 at 8:30 am at [place] (stand alone white building)        No Follow-up on file.    Electronically signed by Maree Erie, MD 01/23/2017 2:35 PM  [place], Phone: [phone]

## 2017-01-23 NOTE — Telephone Encounter (Signed)
Writer set-up transportation for patient via [employer] production [employer].   Invoice #[phone].   Patient advised of pick up scheduled for 5/2 at 1 PM.

## 2017-01-23 NOTE — Patient Instructions (Addendum)
·   Find a pharmacy in [place] close to school when you go back in July     Make contact with the office for people with disabilities at [facility] to let them know about your sickle cell disease and pick up forms for me to fill out     Bring a physical form for [facility] to have Dr. Concha [place] fill out at your next appointment     Take you medication with you out of the house in a perscription bottle     Buy a lock box for your dorm room at [facility] for mediction     Take your Oxy IR only if you need it for pain     Your Eye appointment is May 25 at 8:30 am at [address] [place] (stand alone white building)

## 2017-01-23 NOTE — Progress Notes (Signed)
Cm met with Dashun and discussed the health home program. Dorr lives with his mother and siblings. He will be starting a new job at [employer] on Monday- working in one of [employer], in [employer]. Tildon is currently  enrolled at [facility]  in a 2+2 program; he will be transitioning to [employer] in the Fall- he was awarded a Patent attorney and has accessed financial aid to cover the expenses associated with school. He reported he has his own car. Care management was declined. Saylor was informed to call the office if a need should arise.

## 2017-02-07 ENCOUNTER — Inpatient Hospital Stay
Admission: EM | Admit: 2017-02-07 | Discharge: 2017-02-11 | DRG: 241 | Disposition: A | Discharge status: Home / Self Care | Payer: Medicaid Other | Source: Ambulatory Visit | Attending: Internal Medicine | Admitting: Internal Medicine

## 2017-02-07 ENCOUNTER — Encounter: Payer: Self-pay | Admitting: Emergency Medicine

## 2017-02-07 ENCOUNTER — Emergency Department: Payer: Medicaid Other

## 2017-02-07 DIAGNOSIS — K625 Hemorrhage of anus and rectum: Secondary | ICD-10-CM

## 2017-02-07 DIAGNOSIS — Z9889 Other specified postprocedural states: Secondary | ICD-10-CM

## 2017-02-07 DIAGNOSIS — R195 Other fecal abnormalities: Secondary | ICD-10-CM

## 2017-02-07 DIAGNOSIS — X58XXXA Exposure to other specified factors, initial encounter: Secondary | ICD-10-CM | POA: Diagnosis present

## 2017-02-07 DIAGNOSIS — D649 Anemia, unspecified: Secondary | ICD-10-CM | POA: Diagnosis present

## 2017-02-07 DIAGNOSIS — R109 Unspecified abdominal pain: Secondary | ICD-10-CM

## 2017-02-07 DIAGNOSIS — K2961 Other gastritis with bleeding: Principal | ICD-10-CM | POA: Diagnosis present

## 2017-02-07 DIAGNOSIS — D571 Sickle-cell disease without crisis: Secondary | ICD-10-CM

## 2017-02-07 DIAGNOSIS — T39395A Adverse effect of other nonsteroidal anti-inflammatory drugs [NSAID], initial encounter: Secondary | ICD-10-CM | POA: Diagnosis present

## 2017-02-07 DIAGNOSIS — K838 Other specified diseases of biliary tract: Secondary | ICD-10-CM

## 2017-02-07 DIAGNOSIS — Y929 Unspecified place or not applicable: Secondary | ICD-10-CM

## 2017-02-07 DIAGNOSIS — Z9049 Acquired absence of other specified parts of digestive tract: Secondary | ICD-10-CM

## 2017-02-07 DIAGNOSIS — D6489 Other specified anemias: Secondary | ICD-10-CM | POA: Diagnosis present

## 2017-02-07 DIAGNOSIS — K922 Gastrointestinal hemorrhage, unspecified: Secondary | ICD-10-CM

## 2017-02-07 DIAGNOSIS — R1084 Generalized abdominal pain: Secondary | ICD-10-CM

## 2017-02-07 DIAGNOSIS — M549 Dorsalgia, unspecified: Secondary | ICD-10-CM | POA: Diagnosis present

## 2017-02-07 DIAGNOSIS — K648 Other hemorrhoids: Secondary | ICD-10-CM | POA: Diagnosis present

## 2017-02-07 LAB — CBC AND DIFFERENTIAL
Baso # K/uL: 0 10*3/uL (ref 0.0–0.1)
Basophil %: 0.1 %
Eos # K/uL: 0.2 10*3/uL (ref 0.0–0.5)
Eosinophil %: 3.2 %
Hematocrit: 20 % — ABNORMAL LOW (ref 40–51)
Hemoglobin: 5.9 g/dL — ABNORMAL LOW (ref 13.7–17.5)
IMM Granulocytes #: 0 10*3/uL (ref 0.0–0.1)
IMM Granulocytes: 0.1 %
Lymph # K/uL: 2.1 10*3/uL (ref 1.3–3.6)
Lymphocyte %: 31 %
MCH: 17 pg/cell — ABNORMAL LOW (ref 26–32)
MCHC: 29 g/dL — ABNORMAL LOW (ref 32–37)
MCV: 59 fL — ABNORMAL LOW (ref 79–92)
Mono # K/uL: 0.5 10*3/uL (ref 0.3–0.8)
Monocyte %: 7.6 %
Neut # K/uL: 4 10*3/uL (ref 1.8–5.4)
Nucl RBC # K/uL: 0 10*3/uL (ref 0.0–0.0)
Nucl RBC %: 0.1 /100 WBC (ref 0.0–0.2)
Platelets: 418 10*3/uL — ABNORMAL HIGH (ref 150–330)
RBC: 3.5 MIL/uL — ABNORMAL LOW (ref 4.6–6.1)
RDW: 20 % — ABNORMAL HIGH (ref 11.6–14.4)
Seg Neut %: 58 %
WBC: 6.8 10*3/uL (ref 4.2–9.1)

## 2017-02-07 LAB — RUQ PANEL (ED ONLY)
ALT: 21 U/L (ref 0–50)
AST: 13 U/L (ref 0–50)
Albumin: 3.7 g/dL (ref 3.5–5.2)
Alk Phos: 91 U/L (ref 65–260)
Amylase: 80 U/L (ref 28–100)
Bilirubin,Direct: <0.2 mg/dL (ref 0.0–0.3)
Bilirubin,Total: 0.7 mg/dL (ref 0.0–1.2)
Lipase: 15 U/L (ref 13–60)
Total Protein: 8.3 g/dL — ABNORMAL HIGH (ref 6.3–7.7)

## 2017-02-07 LAB — PLASMA PROF 7 (ED ONLY)
Anion Gap,PL: 10 (ref 7–16)
CO2,Plasma: 24 mmol/L (ref 20–28)
Chloride,Plasma: 97 mmol/L (ref 96–108)
Creatinine: 0.87 mg/dL (ref 0.50–1.00)
GFR,Black: 144 *
GFR,Caucasian: 125 *
Glucose,Plasma: 88 mg/dL (ref 60–99)
Potassium,Plasma: 3.9 mmol/L (ref 3.4–4.7)
Sodium,Plasma: 131 mmol/L — ABNORMAL LOW (ref 133–145)
UN,Plasma: 7 mg/dL (ref 6–20)

## 2017-02-07 LAB — LACTATE DEHYDROGENASE: LD: 322 U/L — ABNORMAL HIGH (ref 118–225)

## 2017-02-07 LAB — TYPE AND SCREEN
ABO RH Blood Type: O POS
Antibody Screen: NEGATIVE

## 2017-02-07 LAB — RBC MORPHOLOGY

## 2017-02-07 LAB — HOLD BLUE

## 2017-02-07 LAB — RETICULOCYTES
Retic %: 1.8 % (ref 0.7–2.3)
Retic Abs: 60.4 10*3/uL (ref 33.8–124.0)

## 2017-02-07 LAB — CRP: CRP: 13 mg/L — ABNORMAL HIGH (ref 0–10)

## 2017-02-07 LAB — SEDIMENTATION RATE, AUTOMATED: Sedimentation Rate: 49 mm/hr — ABNORMAL HIGH (ref 0–15)

## 2017-02-07 MED ORDER — STERILE WATER FOR IRRIGATION IR SOLN *I*
900.0000 mL | Freq: Once | Status: AC
Start: 2017-02-07 — End: 2017-02-07
  Administered 2017-02-07: 900 mL via ORAL

## 2017-02-07 MED ORDER — IOHEXOL 350 MG/ML (OMNIPAQUE) IV SOLN *I*
100.1000 mL | Freq: Once | INTRAVENOUS | Status: AC
Start: 2017-02-07 — End: 2017-02-07
  Administered 2017-02-07: 131 mL via INTRAVENOUS

## 2017-02-07 MED ORDER — SODIUM CHLORIDE 0.9 % IV BOLUS *I*
1000.0000 mL | Freq: Once | Status: AC
Start: 2017-02-07 — End: 2017-02-07
  Administered 2017-02-07: 1000 mL via INTRAVENOUS

## 2017-02-07 MED ORDER — SODIUM CHLORIDE 0.9 % IV SOLN WRAPPED *I*
3.0000 mL/h | Status: DC
Start: 2017-02-07 — End: 2017-02-08
  Administered 2017-02-07: 3 mL/h via INTRAVENOUS

## 2017-02-07 NOTE — H&P (Addendum)
Department of Medicine History and Physical    Chief Complaint: Hematochezia    HPI: Cameron [name] is a 21 y.o. male with a PMH significant for sickle cell disease presenting for concern of abdominal pain and bloody stools.  Patient reports that has experienced mid-epigastric abdominal pain for the last month. It comes in waves and is more associated with eating. He denies any nausea or vomiting.  States the pain is sharp and without radiation.  He has been having about 3 bowel movements per day that are loose. States will have some streaks of red and blood clots in addition to dark stools. Denies any new contacts or new foods.  Has been taking his ibuprofen daily. No history of having this happen before. No history of IBD.     In the ED, hb of 5.9 and given one unit of PRBC. CTAP performed which did not show evidence of bleeding or IBD and extrahepatic duct in segment 2 of the liver that is new compared to prior MRCP.       Review of Systems:     Review of Systems   Constitutional: Negative for chills, fever and weight loss.   HENT: Negative for congestion and sinus pain.    Respiratory: Negative for cough and hemoptysis.    Cardiovascular: Negative for chest pain and leg swelling.   Gastrointestinal: Positive for abdominal pain, blood in stool, diarrhea and melena. Negative for nausea and vomiting.   Genitourinary: Negative for dysuria.   Musculoskeletal: Negative for back pain.       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       Past Surgical History:  Past Surgical History:   Procedure Laterality Date    CHOLECYSTECTOMY  2014       Allergies:  No Known Allergies (drug, envir, food or latex)    Physical Exam:    Intake/Output Summary (Last 24 hours) at 02/08/17 0041  Last data filed at 02/07/17 2244   Gross per 24  hour   Intake             1600 ml   Output                0 ml   Net             1600 ml     Vitals:    02/07/17 1531 02/07/17 1754 02/07/17 2237 02/07/17 2300   BP: 135/78 105/65 126/57 125/58   BP Location:  Right arm Right arm    Pulse: 101 88 88 82   Resp: 20  20 16    Temp: 37.1 C (98.8 F) 37.1 C (98.8 F) 36.8 C (98.2 F) 36.7 C (98.1 F)   TempSrc: Temporal Temporal Oral Oral   SpO2: 100% 100% 100% 99%   Weight: 87.1 kg (192 lb)      Height: 1.854 m (6\' 1" )        General: NAD, seated in bed  HEENT: MMM  Cardiac: Normal rate. Regular rhythm, S1S2, no M/G/R.  Lungs: Clear to auscultation bilaterally.  No wheezes.  Abdomen: soft, nontender, nondistended, normal bowel sounds  Extremities: no LEE, good distal pulses  Neuro: Moving all extremities.  No focal deficits. Speech fluent and appropriate  Labs:   No results for input(s): NA, K, CL, CO2, GLU, MG in the last 72 hours.    No components found with this basename: BUN, CREATININE, CALCIUM, PHOS   Recent Labs      02/07/17   2014   WBC  6.8   Hemoglobin  5.9*   Hematocrit  20*   MCV  59*   Platelets  418*      Recent Labs      02/07/17   2014   AST  13   ALT  21   Albumin  3.7     No components found with this basename: ALKPHOS, BILITOT, BILIDIR, PROT   No results for input(s): INR, PTT in the last 72 hours.    No components found with this basename: PROTIME   No results for input(s): GLU in the last 72 hours.      Radiology: Ct Abdomen And Pelvis Without And With Contrast    Result Date: 02/07/2017  1. No CT evidence of active GI bleeding. No findings to suggest inflammatory bowel disease. 2. Status post cholecystectomy. Focally dilated duct in segment 2 of the liver, new compared to the prior MRCP. Findings may relate to stricture. The extrahepatic duct is normal in caliber and note is made of normal serum bilirubin. 3. Decreased attenuation of the cardiac blood pool compatible with anemia. END OF IMPRESSION I have personally reviewed the image(s) and  the resident's interpretation and agree with or edited the findings. [facility] submits this DICOM format image data and final report to the [facility], an independent secure electronic health information exchange, on a reciprocally searchable basis (with patient authorization) for a minimum of 12 months after exam date.       Assessment and Plan:Cameron [name] is a 21 y.o. male with a PMH significant for sickle cell disease presenting for blood in stools and abdominal pain.     Abdominal Pain: Given dark stools and NSAID use, concern for NSAID gastritis. Will start IV PPI. Possible LGIB as well, but no findings on CTAP to suggest IBD.  His CBC could be dilutional as many cell lines are very different from his priors.   -CBC daily  -H/H every 6 for now  -PPI BID IV  -NPO for now. GI in the AM  -Every 72 T/S    Focal Duct Dilation: No elevation in LFTs. Will trend. May need MRCP tomorrow. Will hold for now.     SSD  -Hold NSAID  -Home folic acid  -Oxycodone as need for pain    Fluids: NS 150 cc/hour  Electrolytes: Daily  Nutrition: NPO  DVT Prophylaxis: IPC given bleed  Med Rec: Completed    Code status: Full Code    Pricilla RiffleBrian Malavika Lira, MD  PGY 3 - Internal Medicine  Pager: [phone]

## 2017-02-07 NOTE — ED Triage Notes (Signed)
C/o abd pain and BRBPR x3 weeks. Hx of SCC, reports last crisis was over 3 years ago.       Triage Note   Jamse Belfastachel Timoth Schara, RN

## 2017-02-07 NOTE — Progress Notes (Signed)
Patient DOB:  [birth year]    Patient Address:  [address]  [place] [address]    Patient Phone:  [phone] (home)     Patient Insurance:      Patient PCP:  Corinne Ports, MD    Patient Appointment Calendar  _0 @    Intervention Status:  DSRIP Intervention status: Initial intervention  DSRIP Patient?: Yes  Patient Barriers: coaching needed  Patient address confirmation: Yes  Patient phone confirmation: Yes  Patient insurance confirmation: Yes  Patient FCM referred?: No  Appt type: Follow up appt-PCP  Appointment center/address:: [place], [place]  Patient followup provider: Corinne Ports, MD  DSRIP Patient followup appointment date: 02/26/17  Patient followup appointment time: 1330  Patient transport plan: self  [facility] Patient application complete?: No  PAM completed?: Yes  PAM level: 4  Time Spent with Patient (min): 28        CHW met with pt who reports has an established PCP and a future appointment already in place.  Please see above flow sheet for details.          Chw met with pt pt stated he would like a sooner appt if possible chw explained to pt we can try to set up a new appt according to pcp availability.        FOLLOW UP PLAN:  Preferred Provider:[name], Senaida Lange, MD  Preferred date/time:Monday/10am  Preferred Transport:self  Referrals [place] Worker  [phone]

## 2017-02-07 NOTE — ED Provider Notes (Addendum)
History     Chief Complaint   Patient presents with    Abdominal Pain     HPI Comments: 21 year old male with a history of sickle cell disease presents for abdominal pain.  He states that he has had diffuse abdominal pain for the last month.  It is cramping in nature.  Denies nausea, vomiting, fevers, urinary symptoms.  States that he has had intermittent diarrhea.  It has had blood in it.  States he also has passed clots.  States today, he had an episode where he could not hold the diarrhea, and had stool incontinence.  Hasn't taken anything prior to arrival.  Has never had this problem prior to one month ago.      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 He has never used smokeless tobacco. He reports that he does not drink alcohol or use illicit drugs. His sexual activity history is not on file.    Living Situation     Questions Responses    Patient lives with Family    Homeless No    Caregiver for other family member     External Services     Employment     Domestic Violence Risk           Problem List     Patient Active Problem List   Diagnosis Code    Sickle cell disease with HPFH D57.1, D56.4    Back pain M54.9       Review of Systems   Review of Systems   Constitutional: Negative for fever.   HENT: Negative for trouble swallowing.    Eyes: Negative for visual disturbance.   Respiratory: Negative for shortness of breath.    Cardiovascular: Negative for chest pain.   Gastrointestinal: Positive for abdominal pain and blood in stool.   Genitourinary: Negative for dysuria.   Musculoskeletal: Negative for gait  problem.   Skin: Negative for color change.   Neurological: Negative for headaches.   Hematological: Does not bruise/bleed easily.   Psychiatric/Behavioral: Negative for agitation.       Physical Exam     ED Triage Vitals   BP Heart Rate Heart Rate (via Pulse Ox) Resp Temp Temp src SpO2 (Retired) O2 Device O2 Flow Rate   02/07/17 1531 02/07/17 1531 -- 02/07/17 1531 02/07/17 1531 02/07/17 1531 02/07/17 1531 -- --   135/78 101  20 37.1 C (98.8 F) TEMPORAL 100 %        Weight           02/07/17 1531           87.1 kg (192 lb)                    Physical Exam   Constitutional: He is oriented to person, place, and time. He appears well-developed and well-nourished.   Does not appear acutely toxic   HENT:  Head: Normocephalic and atraumatic.   Mouth/Throat: Oropharynx is clear and moist.   Eyes: EOM are normal.   Neck: Neck supple. No JVD present.   Cardiovascular: Normal rate and regular rhythm.    Pulmonary/Chest: Effort normal and breath sounds normal. No stridor. No respiratory distress. He has no wheezes. He has no rales.   Abdominal: Soft. Bowel sounds are normal. He exhibits no distension. There is no tenderness. There is no guarding.   Genitourinary: Rectal exam shows guaiac negative stool.   Musculoskeletal: He exhibits no edema.   Neurological: He is alert and oriented to person, place, and time.   Skin: Skin is warm and dry. He is not diaphoretic.   Psychiatric: He has a normal mood and affect. His behavior is normal. Thought content normal.   Nursing note and vitals reviewed.      Medical Decision Making        Initial Evaluation:  ED First Provider Contact     Date/Time Event User Comments    02/07/17 1542 ED First Provider Contact Roselie Skinner Initial Face to Face Provider Contact          Patient seen by me on arrival date of 02/07/2017 at 1923    Assessment:  21 y.o.male comes to the ED with diffuse abdominal pain and bright red blood per rectum in a patient with sickle cell disease, concerning for  lower GI bleeding from angiodysplasia vs new inflammatory bowel disease. Will obtain labs including a hemoglobin/hematocrit and type and screen, as well as a CT.  He is well-appearing and hemodynamically stable with a benign abdomen.    Differential Diagnosis includes GI bleed, angiodysplasia, inflammatory bowel disease, colon mass, malignancy, colitis, anemia                      Plan: CBC, Chem 7, RUQ panel, ESR, CRP, LDH, Retic count, T&S  CT abd/pel  IVF  Toy Care, MD    Update: Labs remarkable for hemoglobin 5.9 and hematocrit of 20.  CRP is 13 and ESR is 49.  His reticulocyte is not appropriately elevated, as it is only 1.8%.  Patient is pending CT scan.  1 unit PRBC ordered to be transfused for anemia.  Toy Care, MD  9:50 PM         Toy Care, MD  Resident  02/07/17 2150  Resident Attestation:     Patient seen by me today, 02/07/2017 at 1950    History:   I reviewed this patient, reviewed the resident's note and agree.  Exam:   I examined this patient, reviewed the resident's note and agree, with edits as above.    Decision Making:   I discussed with the resident his/her documented decision making  and agree.      Author Nandan Willems Hiram Gash, MD       Mckinley Jewel, MD  02/07/17 2215

## 2017-02-07 NOTE — ED Provider Progress Notes (Signed)
ED Provider Progress Note      Labs were remarkable for an acute drop in hematocrit from a baseline of 31 to 20.  Hemoglobin is 5.9.  Patient received 1 unit of packed red blood cells.  CT scan is unremarkable.  Admitted to hospital medicine for further care.     Dario GuardianAngela Author Hatlestad, MD, 02/07/2017, 11:03 PM         Dario GuardianBarskaya, Bates Collington, MD  Resident  02/07/17 [phone]

## 2017-02-07 NOTE — First Provider Contact (Signed)
ED First Provider Contact Note    Initial provider evaluation performed by   ED First Provider Contact     Date/Time Event User Comments    02/07/17 1542 ED First Provider Contact Vincenza HewsGOTIE, Jaidon Sponsel Initial Face to Face Provider Contact          Vital signs reviewed.    21yo male p/w low abdominal pain with BRBPR x 1 week. He has noted BRB with every other BM. He has noted clots with bleeding. Hx Sickle cell disease, has no chest pain or SOB at this time.     Orders placed:  LABS     Patient requires further evaluation.     Azucena FallenAMANDA R Pryor Guettler, NP, 02/07/2017, 3:46 PM    Collaborating physician Dr. Danie BinderBryce Yerman was immediately available     Azucena FallenGotie, Lynda Capistran R, NP  02/07/17 1546

## 2017-02-08 ENCOUNTER — Encounter: Payer: Medicaid Other | Admitting: Gastroenterology

## 2017-02-08 DIAGNOSIS — D6189 Other specified aplastic anemias and other bone marrow failure syndromes: Secondary | ICD-10-CM

## 2017-02-08 LAB — CBC AND DIFFERENTIAL
Baso # K/uL: 0 10*3/uL (ref 0.0–0.1)
Basophil %: 0.3 %
Eos # K/uL: 0.2 10*3/uL (ref 0.0–0.5)
Eosinophil %: 2.4 %
Hematocrit: 23 % — ABNORMAL LOW (ref 40–51)
Hemoglobin: 6.9 g/dL — ABNORMAL LOW (ref 13.7–17.5)
IMM Granulocytes #: 0 10*3/uL (ref 0.0–0.1)
IMM Granulocytes: 0.3 %
Lymph # K/uL: 1.2 10*3/uL — ABNORMAL LOW (ref 1.3–3.6)
Lymphocyte %: 15.9 %
MCH: 18 pg/cell — ABNORMAL LOW (ref 26–32)
MCHC: 30 g/dL — ABNORMAL LOW (ref 32–37)
MCV: 61 fL — ABNORMAL LOW (ref 79–92)
Mono # K/uL: 0.6 10*3/uL (ref 0.3–0.8)
Monocyte %: 8.3 %
Neut # K/uL: 5.5 10*3/uL — ABNORMAL HIGH (ref 1.8–5.4)
Nucl RBC # K/uL: 0 10*3/uL (ref 0.0–0.0)
Nucl RBC %: 0 /100 WBC (ref 0.0–0.2)
Platelets: 389 10*3/uL — ABNORMAL HIGH (ref 150–330)
RBC: 3.8 MIL/uL — ABNORMAL LOW (ref 4.6–6.1)
RDW: 22 % — ABNORMAL HIGH (ref 11.6–14.4)
Seg Neut %: 72.8 %
WBC: 7.6 10*3/uL (ref 4.2–9.1)

## 2017-02-08 LAB — S. PNEUMONIAE ANTIGEN: S. pneumoniae Antigen: 0

## 2017-02-08 LAB — RBC MORPHOLOGY

## 2017-02-08 LAB — BASIC METABOLIC PANEL
Anion Gap: 12 (ref 7–16)
CO2: 23 mmol/L (ref 20–28)
Calcium: 8.8 mg/dL — ABNORMAL LOW (ref 9.3–10.5)
Chloride: 101 mmol/L (ref 96–108)
Creatinine: 0.87 mg/dL (ref 0.50–1.00)
GFR,Black: 144 *
GFR,Caucasian: 125 *
Glucose: 84 mg/dL (ref 60–99)
Lab: 5 mg/dL — ABNORMAL LOW (ref 6–20)
Potassium: 4.1 mmol/L (ref 3.3–5.1)
Sodium: 136 mmol/L (ref 133–145)

## 2017-02-08 LAB — HAPTOGLOBIN: Haptoglobin: 57 mg/dL (ref 30–200)

## 2017-02-08 LAB — FOLATE: Folate: 3.7 ng/mL — AB (ref >=4.6)

## 2017-02-08 LAB — HCT AND HGB
Hematocrit: 19 % — CL (ref 40–51)
Hematocrit: 21 % — ABNORMAL LOW (ref 40–51)
Hematocrit: 24 % — ABNORMAL LOW (ref 40–51)
Hemoglobin: 5.6 g/dL — ABNORMAL LOW (ref 13.7–17.5)
Hemoglobin: 6.4 g/dL — ABNORMAL LOW (ref 13.7–17.5)
Hemoglobin: 7.1 g/dL — ABNORMAL LOW (ref 13.7–17.5)

## 2017-02-08 LAB — TIBC
Iron: 77 ug/dL (ref 45–170)
TIBC: 329 ug/dL (ref 250–450)
Transferrin Saturation: 23 % (ref 20–55)

## 2017-02-08 LAB — MCHC
MCHC: 30 g/dL — ABNORMAL LOW (ref 32–37)
MCHC: 31 g/dL — ABNORMAL LOW (ref 32–37)
MCHC: 30 g/dL — ABNORMAL LOW (ref 32–37)

## 2017-02-08 LAB — RETICULOCYTES
Retic %: 1.4 % (ref 0.7–2.3)
Retic Abs: 44.2 10*3/uL (ref 33.8–124.0)

## 2017-02-08 LAB — DUPLICATE SLIDE: Slide Sent: 9:10 {titer}

## 2017-02-08 LAB — VITAMIN B12: Vitamin B12: 317 pg/mL (ref 232–1245)

## 2017-02-08 LAB — TRANSFERRIN: Transferrin: 254 mg/dL (ref 200–360)

## 2017-02-08 MED ORDER — SODIUM CHLORIDE 0.9 % IV SOLN WRAPPED *I*
100.0000 mL/h | Status: AC
Start: 2017-02-08 — End: 2017-02-09
  Administered 2017-02-08 – 2017-02-09 (×4): 100 mL/h via INTRAVENOUS

## 2017-02-08 MED ORDER — FENTANYL CITRATE 50 MCG/ML IJ SOLN *WRAPPED*
INTRAMUSCULAR | Status: AC | PRN
Start: 2017-02-08 — End: 2017-02-08
  Administered 2017-02-08 (×4): 25 ug via INTRAVENOUS

## 2017-02-08 MED ORDER — SODIUM CHLORIDE 0.9 % IV SOLN WRAPPED *I*
3.0000 mL/h | Status: DC
Start: 2017-02-08 — End: 2017-02-08
  Administered 2017-02-08: 3 mL/h via INTRAVENOUS

## 2017-02-08 MED ORDER — FOLIC ACID 1 MG PO TABS *I*
1.0000 mg | ORAL_TABLET | Freq: Every day | ORAL | Status: DC
Start: 2017-02-08 — End: 2017-02-08
  Administered 2017-02-08: 1 mg via ORAL
  Filled 2017-02-08: qty 1

## 2017-02-08 MED ORDER — KCL IN DEXTROSE-NACL 10-5-0.45 MEQ/L-%-% IV SOLN *I*
100.0000 mL/h | INTRAVENOUS | Status: DC
Start: 2017-02-08 — End: 2017-02-08
  Administered 2017-02-08 (×2): 100 mL/h via INTRAVENOUS

## 2017-02-08 MED ORDER — SODIUM CHLORIDE 0.9 % IV SOLN WRAPPED *I*
150.0000 mL/h | Status: DC
Start: 2017-02-08 — End: 2017-02-08
  Administered 2017-02-08 (×7): 150 mL/h via INTRAVENOUS

## 2017-02-08 MED ORDER — FENTANYL CITRATE 50 MCG/ML IJ SOLN *WRAPPED*
INTRAMUSCULAR | Status: AC
Start: 2017-02-08 — End: 2017-02-08
  Filled 2017-02-08: qty 4

## 2017-02-08 MED ORDER — PANTOPRAZOLE SODIUM 40 MG IV SOLR *I*
40.0000 mg | Freq: Two times a day (BID) | INTRAVENOUS | Status: DC
Start: 2017-02-08 — End: 2017-02-11
  Administered 2017-02-08 – 2017-02-11 (×7): 40 mg via INTRAVENOUS
  Filled 2017-02-08 (×7): qty 10

## 2017-02-08 MED ORDER — FOLIC ACID 5 MG/ML IJ SOLN *I*
1.0000 mg | Freq: Every day | INTRAMUSCULAR | Status: DC
Start: 2017-02-08 — End: 2017-02-11
  Administered 2017-02-08 – 2017-02-11 (×4): 1 mg via INTRAVENOUS
  Filled 2017-02-08 (×4): qty 0.2

## 2017-02-08 MED ORDER — MIDAZOLAM HCL 5 MG/5ML IJ SOLN *I*
INTRAMUSCULAR | Status: AC | PRN
Start: 2017-02-08 — End: 2017-02-08
  Administered 2017-02-08 (×4): 2 mg via INTRAVENOUS

## 2017-02-08 MED ORDER — MIDAZOLAM HCL 5 MG/5ML IJ SOLN *I*
INTRAMUSCULAR | Status: AC
Start: 2017-02-08 — End: 2017-02-08
  Filled 2017-02-08: qty 15

## 2017-02-08 MED ORDER — OXYCODONE HCL 5 MG PO TABS *I*
10.0000 mg | ORAL_TABLET | ORAL | Status: DC | PRN
Start: 2017-02-08 — End: 2017-02-11

## 2017-02-08 NOTE — Progress Notes (Signed)
02/08/17 1500   [facility] Patient Class Review   Patient Class Review Inpatient       Patient class effective:  02/07/2017       Rosalee KaufmanAlyssa Odai Wimmer, RN, BSN  Utilization Management  Phone:  [phone]

## 2017-02-08 NOTE — Preop H&P (Signed)
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     Past Surgical History:   Procedure Laterality Date    CHOLECYSTECTOMY  2014     Family History   Problem Relation Age of Onset    Other Mother     Sickle cell anemia Sister     Sickle cell anemia Brother     Sickle cell anemia Brother      Social History     Social History    Marital status: Single     Spouse name: N/A    Number of children: N/A    Years of education: N/A     Social History Main Topics    Smoking status: Never Smoker    Smokeless tobacco: Never Used    Alcohol use No    Drug use: No    Sexual activity: Not Asked     Other Topics Concern    None     Social History Narrative    06/09/15- Lives at home with mom, brother (32 yrs, 21 yrs), sister (58 yrs) and nephew (4 yrs).        Allergies:  No Known Allergies (drug, envir, food or latex)    Medications:    (Not in a hospital admission)   Current Facility-Administered Medications   Medication Dose Route Frequency    pantoprazole (PROTONIX) 4 mg/ml injection 40 mg  40 mg Intravenous Q12H    sodium chloride 0.9 % IV  150 mL/hr Intravenous Continuous    oxyCODONE (ROXICODONE) IR tablet 10 mg  10 mg Oral Q4H PRN    sodium chloride 0.9 % IV  3 mL/hr Intravenous Continuous    folic acid injection 1 mg  1 mg Intravenous Daily    sodium chloride 0.9 % IV  100 mL/hr Intravenous Continuous     Current Outpatient Prescriptions   Medication    oxyCODONE (ROXICODONE) 10 MG immediate release tablet    ibuprofen (ADVIL,MOTRIN) 884 MG tablet    folic acid (FOLVITE) 1 MG tablet    naloxone (NARCAN) 4 mg/0.1 mL nasal spray    clindamycin-benzoyl peroxide (BENZACLIN) gel    dextromethorphan-guaifenesin (MUCINEX DM) 30-600 MG per 12 hr tablet    Electronic Thermometer (DIGITAL THERMOMETER) MISC     Vitals:    02/08/17 1626    BP: 129/64   Pulse: 77   Resp: 17   Temp: 37.1 C (98.8 F)   Weight:    Height:        ROS:  GI:See HPI.    Physical Examination:  Head/Nose/Throat:negative  Lungs:Lungs clear  Cardiovascular:normal S1 and S2  Abdomen: abdomen soft, non-tender, nondistended, normal active bowel sounds, no masses or organomegaly      Lab Results:   All labs in the last 24 hours:   Recent Results (from the past 24 hour(s))   CBC and differential    Collection Time: 02/07/17  8:14 PM   Result Value Ref Range    WBC 6.8 4.2 - 9.1 THOU/uL    RBC 3.5 (L) 4.6 - 6.1 MIL/uL    Hemoglobin 5.9 (L) 13.7 - 17.5 g/dL    Hematocrit 20 (L) 40 - 51 %    MCV 59 (L) 79 - 92 fL    MCH 17 (L)  26 - 32 pg/cell    MCHC 29 (L) 32 - 37 g/dL    RDW 20.0 (H) 11.6 - 14.4 %    Platelets 418 (H) 150 - 330 THOU/uL    Seg Neut % 58.0 %    Lymphocyte % 31.0 %    Monocyte % 7.6 %    Eosinophil % 3.2 %    Basophil % 0.1 %    Neut # K/uL 4.0 1.8 - 5.4 THOU/uL    Lymph # K/uL 2.1 1.3 - 3.6 THOU/uL    Mono # K/uL 0.5 0.3 - 0.8 THOU/uL    Eos # K/uL 0.2 0.0 - 0.5 THOU/uL    Baso # K/uL 0.0 0.0 - 0.1 THOU/uL    Nucl RBC % 0.1 0.0 - 0.2 /100 WBC    Nucl RBC # K/uL 0.0 0.0 - 0.0 THOU/uL    IMM Granulocytes # 0.0 0.0 - 0.1 THOU/uL    IMM Granulocytes 0.1 %   Plasma profile 7 [facility] ED only)    Collection Time: 02/07/17  8:14 PM   Result Value Ref Range    Chloride,Plasma 97 96 - 108 mmol/L    CO2,Plasma 24 20 - 28 mmol/L    Potassium,Plasma 3.9 3.4 - 4.7 mmol/L    Sodium,Plasma 131 (L) 133 - 145 mmol/L    Anion Gap,PL 10 7 - 16    UN,Plasma 7 6 - 20 mg/dL    Creatinine 0.87 0.50 - 1.00 mg/dL    GFR,Caucasian 125 *    GFR,Black 144 *    Glucose,Plasma 88 60 - 99 mg/dL   Type and screen    Collection Time: 02/07/17  8:14 PM   Result Value Ref Range    ABO RH Blood Type O RH POS     Antibody Screen Negative    Hold blue    Collection Time: 02/07/17  8:14 PM   Result Value Ref Range    Hold Blue HOLD TUBE    Reticulocytes    Collection Time: 02/07/17  8:14 PM   Result Value Ref  Range    Retic % 1.8 0.7 - 2.3 %    Retic Abs 60.4 33.8 - 124.0 THOU/uL   Lactate dehydrogenase    Collection Time: 02/07/17  8:14 PM   Result Value Ref Range    LD 322 (H) 118 - 225 U/L   Sedimentation rate, automated    Collection Time: 02/07/17  8:14 PM   Result Value Ref Range    Sedimentation Rate 49 (H) 0 - 15 mm/hr   C reactive protein    Collection Time: 02/07/17  8:14 PM   Result Value Ref Range    CRP 13 (H) 0 - 10 mg/L   RUQ panel (ED only)    Collection Time: 02/07/17  8:14 PM   Result Value Ref Range    Amylase 80 28 - 100 U/L    Lipase 15 13 - 60 U/L    Total Protein 8.3 (H) 6.3 - 7.7 g/dL    Albumin 3.7 3.5 - 5.2 g/dL    Bilirubin,Total 0.7 0.0 - 1.2 mg/dL    Bilirubin,Direct <0.2 0.0 - 0.3 mg/dL    Alk Phos 91 65 - 260 U/L    AST 13 0 - 50 U/L    ALT 21 0 - 50 U/L   RBC morphology    Collection Time: 02/07/17  8:14 PM   Result Value Ref Range    RBC Morphology RESULTS  Anisocytosis Present (!)     Microcytes Present (!)     Hypochromia Present (!)     Schistocytes Present (!)     Ovalocytes Present (!)     Target Cells Present (!)    Haptoglobin    Collection Time: 02/07/17  8:14 PM   Result Value Ref Range    Haptoglobin 57 30 - 200 mg/dL   Red blood cells    Collection Time: 02/07/17  9:16 PM   Result Value Ref Range    Blood product type Red Blood Cells     Unit Number [id number]     Dispense status Issued     Product code [id number]     Component blood type O Pos     Coding system [id number]     Coded Blood type 5100    HCT and Hgb    Collection Time: 02/08/17  2:45 AM   Result Value Ref Range    Hemoglobin 5.6 (L) 13.7 - 17.5 g/dL    Hematocrit 19 (LL) 40 - 51 %   MCHC    Collection Time: 02/08/17  2:45 AM   Result Value Ref Range    MCHC 30 (L) 32 - 37 g/dL   Duplicate slide    Collection Time: 02/08/17  2:45 AM   Result Value Ref Range    Slide Sent 9:10     Slide Sent To 7-14    Reticulocytes    Collection Time: 02/08/17  2:45 AM   Result Value Ref Range    Retic % 1.4 0.7 - 2.3 %    Retic  Abs 44.2 33.8 - 124.0 THOU/uL   Red blood cells    Collection Time: 02/08/17  3:31 AM   Result Value Ref Range    Blood product type Red Blood Cells     Unit Number [id number]     Dispense status Issued     Product code [id number]     Component blood type O Neg     Coding system [id number]     Coded Blood type 4132    Basic metabolic panel    Collection Time: 02/08/17  7:49 AM   Result Value Ref Range    Glucose 84 60 - 99 mg/dL    Sodium 136 133 - 145 mmol/L    Potassium 4.1 3.3 - 5.1 mmol/L    Chloride 101 96 - 108 mmol/L    CO2 23 20 - 28 mmol/L    Anion Gap 12 7 - 16    UN 5 (L) 6 - 20 mg/dL    Creatinine 0.87 0.50 - 1.00 mg/dL    GFR,Caucasian 125 *    GFR,Black 144 *    Calcium 8.8 (L) 9.3 - 10.5 mg/dL   CBC and differential    Collection Time: 02/08/17  7:49 AM   Result Value Ref Range    WBC 7.6 4.2 - 9.1 THOU/uL    RBC 3.8 (L) 4.6 - 6.1 MIL/uL    Hemoglobin 6.9 (L) 13.7 - 17.5 g/dL    Hematocrit 23 (L) 40 - 51 %    MCV 61 (L) 79 - 92 fL    MCH 18 (L) 26 - 32 pg/cell    MCHC 30 (L) 32 - 37 g/dL    RDW 22.0 (H) 11.6 - 14.4 %    Platelets 389 (H) 150 - 330 THOU/uL    Seg Neut % 72.8 %    Lymphocyte % 15.9 %  Monocyte % 8.3 %    Eosinophil % 2.4 %    Basophil % 0.3 %    Neut # K/uL 5.5 (H) 1.8 - 5.4 THOU/uL    Lymph # K/uL 1.2 (L) 1.3 - 3.6 THOU/uL    Mono # K/uL 0.6 0.3 - 0.8 THOU/uL    Eos # K/uL 0.2 0.0 - 0.5 THOU/uL    Baso # K/uL 0.0 0.0 - 0.1 THOU/uL    Nucl RBC % 0.0 0.0 - 0.2 /100 WBC    Nucl RBC # K/uL 0.0 0.0 - 0.0 THOU/uL    IMM Granulocytes # 0.0 0.0 - 0.1 THOU/uL    IMM Granulocytes 0.3 %     *Note: Due to a large number of results and/or encounters for the requested time period, some results have not been displayed. A complete set of results can be found in Results Review.       Radiology impressions (last 30 days):  Ct Abdomen And Pelvis Without And With Contrast    Result Date: 02/07/2017  02/07/2017 10:16 PM  CT ABDOMEN AND PELVIS WITHOUT AND WITH CONTRAST REASON FOR STUDY:  Brbpr, Abd Pain.  Sickle Cell Disease. ? New Inflammatory Bowel Disease ;  DEMOGRAPHICS:  Cameron [name] is a 21 years Male. ADDITIONAL CLINICAL INFORMATION:   None.  COMPARISON:  MRCP dated 09/26/2012. Right upper quadrant ultrasound dated 10/22/2012. PROCEDURE:  Contiguous images were obtained through the abdomen and pelvis without and with intravenous contrast.  Automated exposure control, adjustment of the mA and/or kV according to patient size, and/or iterative reconstruction techniques were utilized for radiation dose optimization.  Amount and type of contrast that was injected and/or discarded is recorded in the electronic medical record. FINDINGS ABDOMEN: Chest Base: Diffuse decreased attenuation of the cardiac blood pool compatible with anemia. Liver/Biliary Tract: No focal liver lesions. Status post cholecystectomy. Periportal edema. Focally dilated duct in segment 2. Pancreas: Unremarkable. Spleen: Borderline splenomegaly. Adrenals: Unremarkable. Kidneys and Collecting Systems: Unremarkable. Lymph Nodes: There are several mildly prominent mesenteric lymph nodes. For example anterior mesenteric lymph node measures 1.0 cm on series 202 image 82. Vessels: Unremarkable. Abdominal GI Tract/Mesentery and Peritoneal Cavity: No bowel wall thickening or abnormal distention. No free air or free fluid. Normal appendix. Soft Tissues/Musculoskeletal: No acute skeletal disease. FINDINGS PELVIS: Prostate Glands/Seminal Vesicles: Unremarkable. Bladder: Unremarkable. Lymph Nodes: Unremarkable. Vessels: Unremarkable. Pelvic GI Tract/Mesentery and Peritoneal Cavity: No bowel wall thickening or abnormal distention. No free fluid in the pelvis. Normal appendix. No evidence of GI bleeding. Soft Tissues/Musculoskeletal: No acute soft tissue or osseous abnormalities.     1. No CT evidence of active GI bleeding. No findings to suggest inflammatory bowel disease. 2. Status post cholecystectomy. Focally dilated duct in segment 2 of the liver, new  compared to the prior MRCP. Findings may relate to stricture. The extrahepatic duct is normal in caliber and note is made of normal serum bilirubin. 3. Decreased attenuation of the cardiac blood pool compatible with anemia. END OF IMPRESSION I have personally reviewed the image(s) and the resident's interpretation and agree with or edited the findings. [facility] submits this DICOM format image data and final report to the [facility], an independent secure electronic health information exchange, on a reciprocally searchable basis (with patient authorization) for a minimum of 12 months after exam date.       Currently Active Problems:  Patient Active Problem List   Diagnosis Code    Sickle cell disease with HPFH D57.1, D56.4  Back pain M54.9    GI bleeding K92.2            UPDATES TO PATIENT'S CONDITION on the DAY OF SURGERY/PROCEDURE    I. Updates to Patient's Condition (to be completed by a provider privileged to complete a H&P, following reassessment of the patient by the provider):    Full H&P done today; no updates needed.    II. Procedure Readiness   I have reviewed the patient's H&P and updated condition. By completing and signing this form, I attest that this patient is ready for surgery/procedure.      III. Attestation   I have reviewed the updated information regarding the patient's condition and it is appropriate to proceed with the planned surgery/procedure.    Izora Gala, MD as of 4:42 PM 02/08/2017

## 2017-02-08 NOTE — ED Notes (Signed)
Pt at GI clinic. Will assume care upon return to the department.

## 2017-02-08 NOTE — Progress Notes (Signed)
Brief GI Fellow Note    EGD without explanation for GI bleeding.     - Continue to monitor CBC, transfuse per primary team  - Please give Bisacodyl 20 mg po at 2PM on Sunday followed by Golytely 4 liters at 4 PM  - Clear diet on Sunday. NPO after MN on Sunday.  - Colonoscopy on Monday to evaluate for lower GI bleeding source    Angelica Ranaitlin Foor-Pessin, MD  Gastroenterology Fellow

## 2017-02-08 NOTE — ED Notes (Signed)
ED RN INTERN ATTESTATION       I Corliss Skainsess Daivik Overley, RN (RN) reviewed the following charting information by the RN intern: Cameron MaterJacob Reid, RN    Nursing Assessments  Medications  Plan of Care  Teaching   Notes    In the chart of Cameron [name] [age] y.o. male) and attest to the charting being accurate.

## 2017-02-08 NOTE — ED Notes (Signed)
[facility] Not ordered for tele monitoring at this time. Blood infusing, tolerating well. VSS. Pt A+Ox3, denies any pain/discomfort.

## 2017-02-08 NOTE — Consults (Addendum)
Division of Gastroenterology and Hepatology Initial Consult    Admit Date:  02/07/2017  Attending Provider:  Chinita Greenland, MD                                  Admitting Diagnosis: GI bleed  Primary Care Physician:  Corinne Ports, MD     Consult reason: GI bleed    Subjective: Chart reviewed. History obtained from patient and chart review    Cameron [name] is a 21 y.o. male with a past medical history notable for sickle cell, who is admitted with hematochezia and abdominal pain.    Cameron [name]. Pain is worse with eating. Associated with looser than normal stools 2-3 times per day. Occasional blood mixed with stool and on toilet paper with wiping. No nausea/vomiting. He had larger episode of red bloody stool on day prior to presentation. No bowel movement since admission. He has been using Ibuprofen 400 mg daily. No GI symptoms at baseline. No prior GI bleed or endoscopies. No prior blood transfusions.     On arrival to ED, VS: T 37.1, HR 101, BP 135/78. Initial labs notable for Hct 20 (compared to 31 in 04/2016), reticulocyte index 0.43, CRP 13, normal LFTs. Patient was given 1 unit with follow-up Hct 19. Additional 1 unit given with follow-up Hct 23 this morning. Repeat Hct 21 and 3rd unit of blood has been ordered. A CT abdomen/pelvis did not demonstrate any active bleeding or evidence of IBD. Note was made of focally dilated duct in segment 2 of liver (not seen on prior MRCP). 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 GI consulted given concern for possible concurrent GI bleeding.    Review of Systems  Constitutional: No fevers/chills, weight loss.  CV: No chest pain, palpitations, orthopnea, PND, edema.  Pulm: No shortness of breath, cough.  GI: See HPI.  GU: No dysuria or hematuria.  Skin: No jaundice or rashes.  Hem: No easy bruising or  bleeding.  Neuro: No numbness/tingling, weakness, headaches.  MSK: No joint pains or muscle pains.  Endo: No polyuria, polydipsia, polyphagia  Psych: No depression or anxiety.    Medications:  Home Medications:  Prior to Admission medications    Medication Sig Start Date End Date Taking? Authorizing Provider   oxyCODONE (ROXICODONE) 10 MG immediate release tablet Take 1 tablet (10 mg total) by mouth every 4 hours as needed for Pain   Max daily dose: 60 mg 01/23/17  Yes [name], Senaida Lange, MD   ibuprofen (ADVIL,MOTRIN) 600 MG tablet Take 1 tablet (600 mg total) by mouth 4 times daily as needed for Pain 01/02/17  Yes [name], Senaida Lange, MD   folic acid (FOLVITE) 1 MG tablet Take 1 tablet (1 mg total) by mouth daily 09/07/16  Yes Orlena Sheldon, MD   naloxone [facility]) 4 mg/0.1 mL nasal spray Instill 1 spray in 1 nostril once for opioid reversal. Repeat in alternating nostrils with new package every 2-3 minutes until response 01/23/17   [name], Senaida Lange, MD   clindamycin-benzoyl peroxide [facility]) gel Apply topically 2 times daily 01/02/17   [name], Senaida Lange, MD   dextromethorphan-guaifenesin [facility] DM) 30-600 MG per 12 hr tablet Take 1 tablet by mouth 2 times daily as needed for Congestion or Cough 09/07/16  Orlena Sheldon, MD   Electronic Thermometer (DIGITAL THERMOMETER) MISC Use as directed 09/07/16   Orlena Sheldon, MD     Scheduled Meds   pantoprazole  40 mg Intravenous K16W    folic acid  1 mg Oral Daily     Continuous Infusions   sodium chloride 150 mL/hr (02/08/17 0951)    sodium chloride Stopped (02/08/17 0700)     PRN Meds  oxyCODONE    Allergies/Sensitivities:   Allergies as of 02/07/2017    (No Known Allergies (drug, envir, food or latex))     Past Medical Hx:   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 He has never used smokeless tobacco.   reports that he does not drink alcohol.   reports that he does not use illicit drugs.    Family Hx: family history includes Other in his mother; Sickle cell anemia in his brother, brother, and sister.    PHYSICAL EXAM:  Patient Vitals for the past 72 hrs:   BP Temp Temp src Pulse Resp SpO2 Height Weight   02/08/17 0957 121/61 36.9 C (98.4 F) Oral - 16 100 % - -   02/08/17 0713 123/68 36.7 C (98.1 F) Oral - 16 100 % - -   02/08/17 0630 119/76 36.8 C (98.2 F) Oral - - 100 % - -   02/08/17 0432 115/68 36.5 C (97.7 F) Oral - 16 99 % - -   02/08/17 0417 110/55 36.6 C (97.8 F) - 83 16 - - -   02/08/17 0223 124/54 36.4 C (97.5 F) TEMPORAL 72 16 100 % - -   02/08/17 0048 117/71 36.6 C (97.9 F) Oral 89 19 100 % - -   02/07/17 2300 125/58 36.7 C (98.1 F) Oral 82 16 99 % - -   02/07/17 2237 126/57 36.8 C (98.2 F) Oral 88 20 100 % - -   02/07/17 1754 105/65 37.1 C (98.8 F) TEMPORAL 88 - 100 % - -   02/07/17 1531 135/78 37.1 C (98.8 F) TEMPORAL 101 20 100 % 185.4 cm ('6\' 1"' ) 87.1 kg (192 lb)     Wt Readings from Last 3 Encounters:   02/07/17 87.1 kg (192 lb) (89 %, Z= 1.22)*   02/07/17 88.5 kg (195 lb) (90 %, Z= 1.30)*   01/23/17 86.3 kg (190 lb 4.8 oz) (88 %, Z= 1.18)*     * Growth percentiles are based on CDC 2-20 Years data.     I/O last 3 completed shifts:  05/17 0700 - 05/18 0659  In: 1600 (18.4 mL/kg) [I.V.:1000 (0.5 mL/kg/hr); Other:600]  Out: - (0 mL/kg)   Net: 1600  Weight: 87.1 kg     General appearance: Young well-appearing African American gentleman. NAD.  Eye: anicteric   Ears, Nose, Mouth and throat: MMM  Cardiovascular: regular rate and rhythm, S1, S2 normal  Respiratory: clear to auscultation bilaterally.  No c/w   Gastrointestinal: +BS. Soft, non-distended. Epigastric tenderness.  Skin: no jaundice  Neurological: grossly normal, no asterixis  Psychiatric: normal affect   Extremities: extremities warm and dry. No edema    LAB  DATA:      Lab results: 02/08/17  1093 02/08/17  0245  02/07/17  2014 04/30/16  0041   WBC 7.6  --  6.8 8.4   Hemoglobin 6.9* 5.6* 5.9* 10.9*   Hematocrit 23* 19* 20* 31*   RBC 3.8*  --  3.5* 3.5*   Platelets 389*  --  418* 233         Lab results: 02/08/17  0749 02/07/17  2014   Sodium 136  --    Potassium 4.1  --    Chloride 101  --    CO2 23  --    UN 5*  --    Creatinine 0.87 0.87   GFR,Caucasian 125  --    GFR,Black 144  --    Glucose 84  --    Calcium 8.8*  --    Total Protein  --  8.3*   Albumin  --  3.7   ALT  --  21   AST  --  13   Alk Phos  --  91   Bilirubin,Total  --  0.7     Iron studies  Iron 77  Tsat 23%    IMAGING:  CT Abdomen/pelvis, 02/08/2017  1. No CT evidence of active GI bleeding. No findings to suggest inflammatory bowel disease.  2. Status post cholecystectomy. Focally dilated duct in segment 2 of the liver, new compared to the prior MRCP. Findings may relate to stricture. The extrahepatic duct is normal in caliber and note is made of normal serum bilirubin.  3. Decreased attenuation of the cardiac blood pool compatible with anemia.    Assessment/Recommendations  Cameron Cameron [name] is a 21 y.o. male with a past medical history notable for sickle cell, who is admitted with hematochezia and abdominal pain x 1 [name].    Patient reports epigastric abdominal pain worse after eating. He also reports increase in loose stools with some mixed blood. No further bowel movements since admission. CT abdomen/pelvis without GI bleeding explanation. Labs demonstrate acute anemia (nadir 19), hemolysis, and low retic index. Patient is receiving 3rd unit of blood today.     Presentation raises concern for hematologic condition related to sickle cell +/- GI bleed. If significant GI bleeding is present, it is not entirely clear if it is from an upper or lower GI tract in origin. Favor upper source given concurrent abdominal pain in setting of NSAID use (although relatively low dose Ibuprofen 400 mg/day).  However, patient is describing more dark red blood than melena, which is more typical of lower source. At this point, we would recommend beginning with an upper endoscopy to evaluate for source of bleeding. Can discuss colonoscopy depending on EGD findings and clinical presentation going forward.    - Continue to monitor CBC, transfuse per primary team  - NPO  - IV PPI BID  - Plan for EGD today.  - Avoid NSAIDs    Case discussed with consult attending.    Thank you for asking the GI Consult Service to participate in your patient's care. Please update [place] with any change in clinical status. We will continue to follow along with you.    Biagio Borg, MD  PGY-6, Gastroenterology & Hepatology Fellow      I saw and evaluated the patient. I agree with the resident's/fellow's findings and plan of care as documented above.    Izora Gala, MD

## 2017-02-08 NOTE — ED Notes (Signed)
[facility] to be [phone]. Pt will go from AC4 to the floor after procedure.

## 2017-02-08 NOTE — Progress Notes (Signed)
Hospital Medicine Progress Note  for Cameron [name]  02/08/2017   LOS: 1 day     Interval History     - 2 units of PRBC given     Subjective     Patient seen and examined this AM. Per patient his pain is under control. He had one BM today which was not  Blood stained. Provider talked to the patient's mother over the phone and updated her    Objective     Current Vitals Vitals Range (24 Hours)   BP 123/68 (BP Location: Right arm)   Pulse 83   Temp 36.7 C (98.1 F) (Oral)    Resp 16   Ht 1.854 m (6\' 1" )   Wt 87.1 kg (192 lb)   SpO2 100%   BMI 25.33 kg/m2 BP: (105-135)/(54-78)   Temp:  [36.4 C (97.5 F)-37.1 C (98.8 F)]   Temp src: Oral (05/18 0713)  Heart Rate:  [72-101]   Resp:  [16-20]   SpO2:  [99 %-100 %]   Height:  [185.4 cm (6\' 1" )]   Weight:  [87.1 kg (192 lb)-88.5 kg (195 lb)]      Physical Exam  General: Awake, NAD  Cardiac: RRR, no m/r/g  Resp: CTAB, no wheezes, ronchi, or rales  Abd: Soft, epigastric tenderness, non-distended. Normoactive BS.  Ext: Warm and well-perfused. No edema  Skin: No rashes noted  Neuro: CN II-XII grossly intact, no focal deficit      Intake/Output Summary (Last 24 hours) at 02/08/17 0727  Last data filed at 02/07/17 2244   Gross per 24 hour   Intake             1600 ml   Output                0 ml   Net             1600 ml       Labs  Reviewed, and notable for:  LD 322 [high]  Na 131  LFT - WNL  CRP 13, ESR49  Hb 5.9-->5.6  HCT 20 --> 19  Platelets 418   retic- 1.8--> 1.4   Absolute retic count of 0.7 and retic index of 25  Anisocytosis, schistocytes and target cells +    Radiology:  Ct Abdomen And Pelvis Without And With Contrast    Result Date: 02/07/2017  1. No CT evidence of active GI bleeding. No findings to suggest inflammatory bowel disease. 2. Status post cholecystectomy. Focally dilated duct in segment 2 of the liver, new compared to the prior MRCP. Findings may relate to stricture. The extrahepatic duct is normal in caliber and note is made of normal serum  bilirubin. 3. Decreased attenuation of the cardiac blood pool compatible with anemia. END OF IMPRESSION I have personally reviewed the image(s) and the resident's interpretation and agree with or edited the findings. [facility] submits this DICOM format image data and final report to the [facility], an independent secure electronic health information exchange, on a reciprocally searchable basis (with patient authorization) for a minimum of 12 months after exam date.         Assessment/Plan   Cameron [name] is a 21 y.o. 21 y.o. male with a PMH significant for sickle cell disease presenting for blood in stools and abdominal pain.     NSAID gastritis/upper GI bleed: Given dark stools and NSAID use, concern for NSAID gastritis. Marland Kitchen Possible LGIB as well, but no findings on CTAP  to suggest IBD.   - GI consulted, will follow  -CBC daily  -H/H every 6 for now  -PPI BID IV  -NPO for now.    Acute Anemia with inadequate retic response: D/t chronic GI bleed as proven by history and presentation, however would like to rule out hemolysis and aplastic crisis as well. Inadequate retic response as evidenced by poor retic index is concerning.  - curbsided heme, if not improvement till Monday--> formal consuly  - follow up parvovirus virus results to rule out aplastic crisis  - follow up Bili fractionation, retic count, haptoglobin and blood slide examination   - follow iron and B123 studies as well  - would also work up EBV and strep, as they can precipitate aplastic crisis  - recheck retic index tomorrow  - H/H q6h    Focal Duct Dilation: hold off further work up for now   No elevation in LFTs. Will trend.     SSD  -Hold NSAID  -Home folic acid  -Oxycodone as need for pain    Fluids: NS 150 cc/hour  Electrolytes: Daily  Nutrition: NPO  DVT Prophylaxis: IPC given bleed  Med Rec: Completed    Code Status: Full Code     Dispo:pending further work up and improvement    Signed by Burnett KanarisSonia Yoseph Haile, MBBS, PGY-1 PM&R on 02/08/2017 at  7:27 AM  Please page [phone] with any questions

## 2017-02-08 NOTE — Procedures (Signed)
Procedure Report    EGD Procedure Note   Date of Procedure: 02/08/2017    Referring Physician: No ref. provider found   Primary Care Provider: Delrae AlfredPulcino, Tiffany Lynn, MD   Attending Physician: Hurshel KeysShivangi Jaeden Messer, MD  Fellow: None  Indication(s): Blood in stool, anemia, h/o sickle cell anemia     Medications: Fentanyl 100 mcg IV and Midazolam 8 mg IV were administered incrementally over the course of the procedure to achieve an adequate level of moderate sedation.   Moderate Sedation Face Times  Start Time: 1645  End Time: 1657  Duration (minutes): 12 Minutes        Endoscope: [id number]  Accessories:None    Procedure Description: Full disclosure of risks were reviewed with patient as detailed on the consent form. The patient was placed in the left lateral decubitus position and monitored with continuous pulse oximetry, interval blood pressure monitoring and direct observations.   After adequate sedation, the endoscope was carefully introduced into the oropharynx and passed in to the esophagus under direct visualization. The esophagus and GE junction were carefully examined, including a measurement of the Z-line at 41 cm, GEJ at 41 cm and hiatus at 41 cm from the incisors. After advancement of the endoscope into the stomach, a careful examination was performed, including views of the antrum, incisura angularis, corpus and retroflexed views of the cardia and fundus.   The pylorus was then intubated without any difficulty and the endoscope was advanced to the second portion of the duodenum. Careful examination of the second portion of the duodenum and the bulb was then performed.  The stomach was then decompressed and the endoscope withdrawn.   Findings and intervention(s) are detailed below. The patient was recovered in the GI recovery area    Findings:     Esophagus:   Normal      Stomach:   Normal      Duodenum/small bowel:   Normal    Normal major ampulla      Intervention(s):   None    Complication(s):   none    Impression(s):  1. Normal examination   2. No ulcers, erosions or bleeding stigmata seen   3. Bile seen in the duodenum    Recommendation(s):   Follow-up with PCP    Monitor H/H and transfuse as needed.    Plan for colonoscopy on Monday. Prep on Sunday.    Ok to give clear liquid diet     Histopathologic Diagnosis:   none    Hurshel KeysShivangi Errik Mitchelle, MD     Moderate Sedation Face Times  Start Time: 1645  End Time: 1657  Duration (minutes): 12 Minutes

## 2017-02-08 NOTE — Student Note (Signed)
HOSPITAL MEDICINE H&P    CC: Patient is a 21 year old male with a history of sickle cell disease SS presenting to the hospital with a 36-monthhistory of abdominal pain and bloody stools.     HPI: Patient started noticing some crampy abdominal pain about a month ago. He had never experienced this sort of pain before. He tried taking tylenol and advil for the pain with no effect. During this time he started experiencing intermittent diarrhea with blood and clots in his stool. He denies any fatigue during this time. He has been active, playing sports, etc and attests to not feeling out of the ordinary other than the stomach pain. He does attest to a history of lightheadedness, most notably when standing up too quickly. After about a month of pain and bloody stools with no improvement, patient presented to the ED. He was noted to be severely anemic and received two units of PRBCs.     Review of systems:   General: Negative for fevers, chills, night sweats  Cardiac: Negative for chest pain.  Pulmonary: Negative for shortness of breath, cough  Abdomen: Positive for abdominal pain, hematochezia   Endocrine: Negative for heat/cold intolerance  GU: Negative for dysuria, hematuria    Vitals:  BP: (105-135)/(54-78)   Temp:  [36.4 C (97.5 F)-37.3 C (99.1 F)]   Heart Rate:  [71-101]   Resp:  [16-20]   SpO2:  [99 %-100 %] on room air  Height:  [185.4 cm ('6\' 1"' )]   Weight:  [87.1 kg (192 lb)-88.5 kg (195 lb)]     Exam:  General: NAD, pleasant  Cardiac: RRR, S1 + S2 observed, no murmurs  Pulmonary: Lungs bilaterally clear to auscultation  Abdomen: Soft, non-tender, normoactive bowel sounds, no pain on palpation in all four quadrants, no hepatomegaly or splenomegaly  HEENT: Mucous membranes moist but pale, inner eyelids pale.   Extremities: No edema  Skin: No bruising/discoloration  Neuro: Alert + oriented x3    Labs: Notable for severe anemia, normal WBC, platelets, elevated ESR, CRP    Lab 02/08/17  0749   Sodium 136    Potassium 4.1   Chloride 101   CO2 23   UN 5*   Creatinine 0.87   Glucose 84   Calcium 8.8*     Lab 02/08/17  1251 02/08/17  0749 02/08/17  0245 02/07/17  2014   WBC  --  7.6  --  6.8   Hemoglobin 6.4* 6.9* 5.6* 5.9*   Hematocrit 21* 23* 19* 20*   Platelets  --  389*  --  418*     Lab 02/07/17  2014   AST 13   ALT 21   Bilirubin,Total 0.7   Bilirubin,Direct <0.2   Alk Phos 91   Total Protein 8.3*   Albumin 3.7     Lab 02/07/17  2014   LD 322*     Lab 02/07/17  2014   Amylase 80   Lipase 15     Lab 02/07/17  2014   CRP 13*   Sedimentation Rate 49*     Lab 02/08/17  0749   GFR,Caucasian 125      Ref. Range 04/30/2016 00:41 02/07/2017 20:14 02/08/2017 02:45   Retic % Latest Ref Range: 0.7 - 2.3 % 5.5 (H) 1.8 1.4       Meds:   pantoprazole  40 mg Intravenous QV40J   folic acid  1 mg Intravenous Daily     Continuous Infusions:  sodium chloride 150 mL/hr (02/08/17 1403)    sodium chloride 3 mL/hr (02/08/17 1422)    sodium chloride 100 mL/hr (02/08/17 1527)     PRN Meds:.oxyCODONE    Imaging:  5.17.18 CT Abdomen/Pelvis: No evidence of active GI bleeding. No evidence of IBD. Focally dilated duct in segment 2 of the liver.      Assessment/Plan: Patient is a 21 year old male with a history of sickle cell disease SS presenting to the hospital with severe anemia secondary to a 101-monthhistory of abdominal pain and bloody stools.     GI Bleed: Patient attests to a 114-monthistory of blood and clots in stool. He has been taking ibuprofen for abdominal pain during this time. CT scan showed no active bleeding and no evidence of IBD. Patient is now s/p 3 units of PRBCs.   DDx: Gastritis caused by NSAIDs vs IBD vs infectious colitis, most likely NSAID-provoked gastritis. Infectious colitis would likely be visualized on CT scan and there would be more systemic symptoms. Likewise it would be unlikely for a first presentation of IBD with no real changes in bowel habits.   Plan:   - Daily CBCs, H/H Q6H.   - 40 mg pantoprazole Q12H  -  HOLD NSAIDs   - NS 150 ml/hr  - Transfuse 1 unit PRBCs for H/H < 7/21   - consult GI for upper endoscopy to localize source of bleeding, potentially colonoscopy if EGD finds nothing    Sickle cell disease: Patient has a high % of fetal hemoglobin and attests to no pain crises in the last 3 years. He is functionally asplenic. His reticulocyte count is much lower than would be expected in the context of hemolytic anemia and an active bleed. Suspicion would be for aplastic crisis secondary to sickle cell disease.   - test for parvovirus B19  - test for strep. pneumo antigen, EBV  - continue blood transfusions as needed until blood counts recover, treat symptomatically  - oxycodone 10 mg Q4H PRN in case of pain crisis    This is a meCareers information officerote. Please refer to resident/attending note for the complete plan.    JuCarmie End

## 2017-02-08 NOTE — ED Notes (Signed)
ED RN INTERN ATTESTATION       I Cameron Dettore, RN (RN) reviewed the following charting information by the RN intern: Cameron Reid, RN    Nursing Assessments  Medications  Plan of Care  Teaching   Notes    In the chart of Cameron [name] (20 y.o. male) and attest to the charting being accurate.

## 2017-02-08 NOTE — Progress Notes (Signed)
Pt. Admitted to unit at 1740, VSS. Oriented to unit and call bell.   2 RN Skin assessment completed with Nate, RN notable for Hives around the elbows bilaterally. Provider paged. Patient asymptomatic.   Dahlia BailiffMegan Armeda Plumb, RN

## 2017-02-08 NOTE — ED Notes (Signed)
Report Given To  Meg, RN      Descriptive Sentence / Reason for Admission   21 yo male with hx of sickle cell anemia presents with about a month of blood in stool      Active Issues / Relevant Events   GI bleed  Hematocrit 20-->19-->23-->21  Hemoglobin 5.9-->5.6-->6.9-->6.4  AOx4  Ambulatory  3 transfusion complete      To Do List  Vitals per policy  Assess  NPO  Full code  Q6 H&H      Anticipatory Guidance / Discharge Planning  Admit for GI bleed

## 2017-02-08 NOTE — ED Notes (Signed)
Report Given To  Gerilyn PilgrimJacob RN      Descriptive Sentence / Reason for Admission   21 yo male with hx of sickle cell anemia presents with about a month of blood in stool      Active Issues / Relevant Events   GI bleed  Hematocrit 20-->19  Hemoglobin 5.9-->5.6  AOx4  Ambulatory  1 transfusion complete, on second      To Do List  Vitals per policy  Assess  NPO  Full code  Q6 H&H      Anticipatory Guidance / Discharge Planning  Admit for GI bleed

## 2017-02-09 DIAGNOSIS — D5700 Hb-SS disease with crisis, unspecified: Secondary | ICD-10-CM

## 2017-02-09 DIAGNOSIS — D564 Hereditary persistence of fetal hemoglobin [HPFH]: Secondary | ICD-10-CM

## 2017-02-09 DIAGNOSIS — K922 Gastrointestinal hemorrhage, unspecified: Secondary | ICD-10-CM

## 2017-02-09 LAB — BASIC METABOLIC PANEL
Anion Gap: 11 (ref 7–16)
CO2: 23 mmol/L (ref 20–28)
Calcium: 8.5 mg/dL — ABNORMAL LOW (ref 9.3–10.5)
Chloride: 106 mmol/L (ref 96–108)
Creatinine: 0.84 mg/dL (ref 0.50–1.00)
GFR,Black: 146 *
GFR,Caucasian: 126 *
Glucose: 71 mg/dL (ref 60–99)
Lab: 5 mg/dL — ABNORMAL LOW (ref 6–20)
Potassium: 4 mmol/L (ref 3.3–5.1)
Sodium: 140 mmol/L (ref 133–145)

## 2017-02-09 LAB — RED BLOOD CELLS
Coded Blood type: 5100
Coded Blood type: 9500
Component blood type: O POS
Component blood type: O NEG
Dispense status: TRANSFUSED
Dispense status: TRANSFUSED

## 2017-02-09 LAB — CBC AND DIFFERENTIAL
Baso # K/uL: 0 10*3/uL (ref 0.0–0.1)
Basophil %: 0.5 %
Eos # K/uL: 0.3 10*3/uL (ref 0.0–0.5)
Eosinophil %: 4 %
Hematocrit: 23 % — ABNORMAL LOW (ref 40–51)
Hemoglobin: 7 g/dL — ABNORMAL LOW (ref 13.7–17.5)
IMM Granulocytes #: 0 10*3/uL (ref 0.0–0.1)
IMM Granulocytes: 0.2 %
Lymph # K/uL: 1.5 10*3/uL (ref 1.3–3.6)
Lymphocyte %: 24.6 %
MCH: 19 pg/cell — ABNORMAL LOW (ref 26–32)
MCHC: 30 g/dL — ABNORMAL LOW (ref 32–37)
MCV: 64 fL — ABNORMAL LOW (ref 79–92)
Mono # K/uL: 0.7 10*3/uL (ref 0.3–0.8)
Monocyte %: 10.5 %
Neut # K/uL: 3.7 10*3/uL (ref 1.8–5.4)
Nucl RBC # K/uL: 0 10*3/uL (ref 0.0–0.0)
Nucl RBC %: 0 /100 WBC (ref 0.0–0.2)
Platelets: 302 10*3/uL (ref 150–330)
RBC: 3.7 MIL/uL — ABNORMAL LOW (ref 4.6–6.1)
RDW: 23.9 % — ABNORMAL HIGH (ref 11.6–14.4)
Seg Neut %: 60.2 %
WBC: 6.2 10*3/uL (ref 4.2–9.1)

## 2017-02-09 LAB — RETICULOCYTES
Retic %: 1.3 % (ref 0.7–2.3)
Retic Abs: 45.8 10*3/uL (ref 33.8–124.0)

## 2017-02-09 LAB — HCT AND HGB
Hematocrit: 23 % — ABNORMAL LOW (ref 40–51)
Hematocrit: 27 % — ABNORMAL LOW (ref 40–51)
Hemoglobin: 7 g/dL — ABNORMAL LOW (ref 13.7–17.5)
Hemoglobin: 7.8 g/dL — ABNORMAL LOW (ref 13.7–17.5)

## 2017-02-09 LAB — MCHC
MCHC: 30 g/dL — ABNORMAL LOW (ref 32–37)
MCHC: 29 g/dL — ABNORMAL LOW (ref 32–37)

## 2017-02-09 MED ORDER — BISACODYL EC 5 MG PO TBEC *I*
20.0000 mg | DELAYED_RELEASE_TABLET | Freq: Once | ORAL | Status: AC
Start: 2017-02-10 — End: 2017-02-10
  Administered 2017-02-10: 20 mg via ORAL
  Filled 2017-02-09: qty 4

## 2017-02-09 MED ORDER — PEG 3350-KCL-NABCB-NACL-NASULF 236 GM PO SOLR *I*
4000.0000 mL | Freq: Once | ORAL | Status: AC
Start: 2017-02-10 — End: 2017-02-10
  Administered 2017-02-10: 4000 mL via ORAL
  Filled 2017-02-09: qty 4000

## 2017-02-09 NOTE — Progress Notes (Addendum)
Medicine Progress Note  Patient Name: Cameron [name]   Admission date: 02/07/2017  Length of stay: 2    Interval History     H/H is stable.  He feels OK.  A little weak, still having abdominal pain.  He had dark bloody bowel movements.    No dizziness, weakness.  He had EGD yesterday, tolerated well.  He is eating clear liquid today and tomorrow, plan for C scope Monday.    Objective      Physical Exam  Recent vital signs reviewed and notable for:  Afebrile, normotensive. Nontachy.      General: NAD  HEENT: MMM, sclera anicteric, no oropharyngeal lesions  Neck: No JVD  Cardiac: RRR, S1S2, no murmurs, gallops, or rubs  Lungs: clear to auscultation bilaterally  Abdomen: soft, diffusely tender, non-distended  Extremities: no LEE, W/WP  Neurologic: Grossly intact     Relevant Lab, Micro, and Imaging Studies   Reviewed, H/H stable.  BMP wnl.    EGD  Impression(s):  1. Normal examination   2. No ulcers, erosions or bleeding stigmata seen   3. Bile seen in the duodenum      Assessment     Cameron [name] is a 21 y.o. male with PMH significant for sickle cell disease who was admitted on 02/07/17 with 2 months of diffuse abdominal pain and BRBPR that started after taking Advil and Tylenol. 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 S/p 3 units pRBC. H/H is stable following transfusion    He is s/p normal EGD and will have colonoscopy planned for Mon.    Plan       Dark, bloody stools, abdominal pain.  Now s/p EGD without evidence of ulcer, gastritis in upper GI tract.   -Space out H/H to BID  - continue PPI for now  - transfuse for Hgb / Hct < 7 / 21  -Per GI:    - Bisacodyl 20 mg po at 2PM on Sunday followed by Golytely 4 liters at 4 PM   - Clear diet on Sunday. NPO after MN on Sunday.   - Colonoscopy on Monday to evaluate for lower GI bleeding source         Hgb SS disease   - IV folate  given low folate on serum studies   - iron studies, b12 normal   - S pneumo negative. pB19 in process. EBV was never collected, will reorder   - Heme aware of patient and will see if retics remain depressed over weekend   - retics today and every 48hr      DVT Ppx: IPCs given bleeding    Discharge barriers: medical stability    Full Code    Hermenia BersMatthew Harrison Taylor, MD  Internal Medicine, PGY-3  02/09/2017 at [facility]6:45 AM     HOSPITAL MEDICINE ATTENDING ADDENDUM    I have seen and evaluated the patient, I have reviewed the medical history as well as the lab, imaging, and micro data.  I agree with the findings and plan of care as documented by Dr. Ladona Ridgelaylor.     Will follow reticulocyte count.  Very low UN and low folate, consider ordering PT-INR and prealbumin as I suspect poor baseline nutritional status.  Has had some elevation in INR in the past.     Kristopher GleeMary E Wladyslaw Henrichs, MD  1:59 PM  02/09/2017

## 2017-02-09 NOTE — Student Note (Signed)
HOSPITAL MEDICINE PROGRESS NOTE:    CC: Patient is a 21 year old male with a history of sickle cell disease SS presenting to the hospital with severe anemia and reticulocytopenia secondary to 49-monthhistory of abdominal pain and bloody stools.     O/N: Got EGD, no events, no transfusions.     Subjective: Patient is feeling well this morning. He denies any chest pain, abdominal pain, pain in hands/feet/upper/lower extremities. He has not been ambulating around the unit. He attests to 1x bowel movement last night at 3 AM with dark blood mixed in with the stool. He denies SOB, nausea, vomiting, fatigue, lightheadedness.     Objective:    Vitals: all stable  BP: (116-136)/(55-101)   Temp:  [36.1 C (97 F)-37.7 C (99.9 F)]   Heart Rate:  [65-103]   Resp:  [16-18]   SpO2:  [97 %-100 %]     Exam:  General: NAD, pleasant  Cardiac: RRR, S1 + S2 observed, no murmur  Pulmonary: Lungs bilaterally clear to auscultation  Abdomen: Soft + non-distended, normoactive bowel sounds, no pain on palpation in all four quadrants  Skin: No bruising/discoloration  HEENT: pale mucous membranes    Labs: Notable for severe anemia with reticulocytopenia. Elevated ESR/CRP    Lab 02/09/17  0047 02/08/17  0749   Sodium 140 136   Potassium 4.0 4.1   Chloride 106 101   CO2 23 23   UN 5* 5*   Creatinine 0.84 0.87   Glucose 71 84   Calcium 8.5* 8.8*     Lab 02/09/17  0610 02/09/17  0049 02/08/17  1845 02/07/17  2014   WBC  --  6.2  --  6.8   Hemoglobin 7.0* 7.0* 7.1* 5.9*   Hematocrit 23* 23* 24* 20*   Platelets  --  302  --  418*     Lab 02/07/17  2014   CRP 13*   Sedimentation Rate 49*     Lab 02/09/17  0047 02/08/17  0749   GFR,Black 146 144      Ref. Range 04/30/2016 00:41 02/07/2017 20:14 02/08/2017 02:45 02/09/2017 06:10   Retic % Latest Ref Range: 0.7 - 2.3 % 5.5 (H) 1.8 1.4 1.3     Strep. pneumo urine antigen: negative    Meds:   [START ON 02/10/2017] bisacodyl  20 mg Oral Once    [START ON 02/10/2017] polyethylene glycol  4,000 mL Oral Once     pantoprazole  40 mg Intravenous QU93A   folic acid  1 mg Intravenous Daily     Continuous Infusions:   sodium chloride 100 mL/hr (02/09/17 0608)     PRN Meds:.oxyCODONE    Imaging: 5.18.18 EGD:  Impression(s):  1. Normal examination   2. No ulcers, erosions or bleeding stigmata seen   3. Bile seen in the duodenum    Assessment/Plan:  Patient is a 21year old male with a history of sickle cell disease SS presenting to the hospital with severe anemia and reticulocytopenia secondary to 164-monthistory of abdominal pain and bloody stools.     Anemia: Presumably a GI bleed superimposed on sickle cell disease. EGD showed no evidence of an UGIB. Patient attests to 1x bloody stool yesterday, however, H/H has been relatively stable. Reticulocyte counts remain massively depressed, suggesting aplastic anemia, although we have no evidence yet about an infection that preciptated this. CT showed no evidence of IBD, however, this could only be definitely diagnosed or ruled out based on colonoscopy.  Colonoscopy would also identify an LGIB with potential for intervention.   - colonscopy Monday, clears Sunday, NPO at midnight before the procedure  - 20 mg bisacodyl + 4L golytely Sunday   - H/Hs every 12 hours, CBC + retic count every day to assess for bone marrow recovery    This is a Careers information officer note. Please refer to resident/attending note for the complete plan.    Carmie End

## 2017-02-10 LAB — CBC AND DIFFERENTIAL
Baso # K/uL: 0 10*3/uL (ref 0.0–0.1)
Basophil %: 0.2 %
Eos # K/uL: 0.2 10*3/uL (ref 0.0–0.5)
Eosinophil %: 4.6 %
Hematocrit: 23 % — ABNORMAL LOW (ref 40–51)
Hemoglobin: 7.1 g/dL — ABNORMAL LOW (ref 13.7–17.5)
IMM Granulocytes #: 0 10*3/uL (ref 0.0–0.1)
IMM Granulocytes: 0.2 %
Lymph # K/uL: 1.3 10*3/uL (ref 1.3–3.6)
Lymphocyte %: 24.8 %
MCH: 19 pg/cell — ABNORMAL LOW (ref 26–32)
MCHC: 31 g/dL — ABNORMAL LOW (ref 32–37)
MCV: 63 fL — ABNORMAL LOW (ref 79–92)
Mono # K/uL: 0.5 10*3/uL (ref 0.3–0.8)
Monocyte %: 9.1 %
Neut # K/uL: 3.1 10*3/uL (ref 1.8–5.4)
Nucl RBC # K/uL: 0 10*3/uL (ref 0.0–0.0)
Nucl RBC %: 0 /100 WBC (ref 0.0–0.2)
Platelets: 317 10*3/uL (ref 150–330)
RBC: 3.7 MIL/uL — ABNORMAL LOW (ref 4.6–6.1)
RDW: 24.5 % — ABNORMAL HIGH (ref 11.6–14.4)
Seg Neut %: 61.1 %
WBC: 5 10*3/uL (ref 4.2–9.1)

## 2017-02-10 LAB — HCT AND HGB
Hematocrit: 27 % — ABNORMAL LOW (ref 40–51)
Hemoglobin: 8 g/dL — ABNORMAL LOW (ref 13.7–17.5)

## 2017-02-10 LAB — RED BLOOD CELLS
Coded Blood type: 9500
Component blood type: O NEG
Dispense status: TRANSFUSED

## 2017-02-10 LAB — BASIC METABOLIC PANEL
Anion Gap: 10 (ref 7–16)
CO2: 25 mmol/L (ref 20–28)
Calcium: 8.8 mg/dL — ABNORMAL LOW (ref 9.3–10.5)
Chloride: 102 mmol/L (ref 96–108)
Creatinine: 0.93 mg/dL (ref 0.50–1.00)
GFR,Black: 136 *
GFR,Caucasian: 118 *
Glucose: 80 mg/dL (ref 60–99)
Lab: 4 mg/dL — ABNORMAL LOW (ref 6–20)
Potassium: 3.7 mmol/L (ref 3.3–5.1)
Sodium: 137 mmol/L (ref 133–145)

## 2017-02-10 LAB — TYPE AND SCREEN
ABO RH Blood Type: O POS
Antibody Screen: NEGATIVE

## 2017-02-10 LAB — PROTIME-INR
INR: 1.3 — ABNORMAL HIGH (ref 0.9–1.1)
Protime: 15.5 s — ABNORMAL HIGH (ref 10.0–12.9)

## 2017-02-10 LAB — MCHC: MCHC: 30 g/dL — ABNORMAL LOW (ref 32–37)

## 2017-02-10 NOTE — Progress Notes (Signed)
Medicine Progress Note  Patient Name: Cameron [name]   Admission date: 02/07/2017  Length of stay: 3    Interval History     H/H is stable.  Pt He feels OK, no epigastric or abdominal pain. Had one dark, loose BM without fresh blood last night.     On clear liquid diet today , plan for C scope Monday.    Objective      Physical Exam  Recent vital signs reviewed and notable for:  Afebrile, normotensive. Nontachy.      General: NAD  HEENT: MMM, sclera anicteric, no oropharyngeal lesions  Neck: No JVD  Cardiac: RRR, S1S2, no murmurs, gallops, or rubs  Lungs: clear to auscultation bilaterally  Abdomen: soft, diffusely tender, non-distended  Extremities: no LEE, W/WP  Neurologic: Grossly intact     Relevant Lab, Micro, and Imaging Studies   Reviewed, H/H stable.  BMP wnl.  Hb 7.1 HCT 23  R abs 45.8   retic 5/19 1.3  INR 1.3  EBV in process  PB19, IG G and M, in process   Haptoglobin wnl    EGD  Impression(s):  1. Normal examination   2. No ulcers, erosions or bleeding stigmata seen   3. Bile seen in the duodenum      Assessment     Cameron [name] is a 21 y.o. male with PMH significant for sickle cell disease who was admitted on 02/07/17 with 2 months of diffuse abdominal pain and BRBPR that started after taking Advil and Tylenol. 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 S/p 3 units pRBC. H/H is stable following transfusion    He is s/p normal EGD and will have colonoscopy planned for Mon.    Plan       Dark, bloody stools, abdominal pain.  Now s/p EGD without evidence of ulcer, gastritis in upper GI tract.   -cont H/H to BID  - continue PPI for now  - transfuse for Hgb / Hct < 7 / 21  -Per GI:    - [facility]yl [facility] followed by Golytely 4 liters at 4 PM   - Clear diet on Sunday. NPO after MN today.   - Colonoscopy on Monday to evaluate for lower GI bleeding source           Hgb SS disease   - IV folate given low folate on serum studies   - iron studies, b12 normal   - S pneumo negative. pB19 in process. EBV in process as well   - Heme aware of patient and will see if retics remain depressed over weekend   - retics today and every 48hr      DVT Ppx: IPCs given bleeding    Discharge barriers: medical stability    Full Code    Burnett KanarisSonia Ethal Gotay, MBBS  PM&R PGY1  02/10/2017 at 5:55 AM

## 2017-02-11 ENCOUNTER — Encounter: Payer: Self-pay | Admitting: Gastroenterology

## 2017-02-11 ENCOUNTER — Inpatient Hospital Stay: Payer: Medicaid Other | Admitting: Gastroenterology

## 2017-02-11 ENCOUNTER — Telehealth: Payer: Self-pay

## 2017-02-11 ENCOUNTER — Other Ambulatory Visit: Payer: Self-pay | Admitting: Hematology and Oncology

## 2017-02-11 DIAGNOSIS — D649 Anemia, unspecified: Secondary | ICD-10-CM

## 2017-02-11 DIAGNOSIS — Z9889 Other specified postprocedural states: Secondary | ICD-10-CM

## 2017-02-11 LAB — BASIC METABOLIC PANEL
Anion Gap: 15 (ref 7–16)
CO2: 22 mmol/L (ref 20–28)
Calcium: 9.4 mg/dL (ref 9.3–10.5)
Chloride: 100 mmol/L (ref 96–108)
Creatinine: 0.91 mg/dL (ref 0.50–1.00)
GFR,Black: 140 *
GFR,Caucasian: 121 *
Glucose: 73 mg/dL (ref 60–99)
Lab: 6 mg/dL (ref 6–20)
Potassium: 3.8 mmol/L (ref 3.3–5.1)
Sodium: 137 mmol/L (ref 133–145)

## 2017-02-11 LAB — CBC AND DIFFERENTIAL
Baso # K/uL: 0 10*3/uL (ref 0.0–0.1)
Basophil %: 0.3 %
Eos # K/uL: 0.3 10*3/uL (ref 0.0–0.5)
Eosinophil %: 4.7 %
Hematocrit: 28 % — ABNORMAL LOW (ref 40–51)
Hemoglobin: 8.4 g/dL — ABNORMAL LOW (ref 13.7–17.5)
IMM Granulocytes #: 0 10*3/uL (ref 0.0–0.1)
IMM Granulocytes: 0.5 %
Lymph # K/uL: 1.5 10*3/uL (ref 1.3–3.6)
Lymphocyte %: 24.5 %
MCH: 19 pg/cell — ABNORMAL LOW (ref 26–32)
MCHC: 30 g/dL — ABNORMAL LOW (ref 32–37)
MCV: 65 fL — ABNORMAL LOW (ref 79–92)
Mono # K/uL: 0.6 10*3/uL (ref 0.3–0.8)
Monocyte %: 9.1 %
Neut # K/uL: 3.8 10*3/uL (ref 1.8–5.4)
Nucl RBC # K/uL: 0 10*3/uL (ref 0.0–0.0)
Nucl RBC %: 0 /100 WBC (ref 0.0–0.2)
Platelets: 341 10*3/uL — ABNORMAL HIGH (ref 150–330)
RBC: 4.4 MIL/uL — ABNORMAL LOW (ref 4.6–6.1)
RDW: 25.3 % — ABNORMAL HIGH (ref 11.6–14.4)
Seg Neut %: 60.9 %
WBC: 6.2 10*3/uL (ref 4.2–9.1)

## 2017-02-11 LAB — DUPLICATE SLIDE: Slide Sent To: 114

## 2017-02-11 LAB — EPSTEIN-BARR VIRUS IGG&IGM
EBV IgG: POSITIVE
EBV IgM: NEGATIVE

## 2017-02-11 LAB — RETICULOCYTES
Retic %: 0.7 % (ref 0.7–2.3)
Retic Abs: 32.5 10*3/uL — ABNORMAL LOW (ref 33.8–124.0)

## 2017-02-11 MED ORDER — MIDAZOLAM HCL 5 MG/5ML IJ SOLN *I*
INTRAMUSCULAR | Status: AC | PRN
Start: 2017-02-11 — End: 2017-02-11
  Administered 2017-02-11 (×3): 2 mg via INTRAVENOUS

## 2017-02-11 MED ORDER — LACTATED RINGERS IV SOLN *I*
100.0000 mL/h | INTRAVENOUS | Status: DC
Start: 2017-02-11 — End: 2017-02-11

## 2017-02-11 MED ORDER — DIPHENHYDRAMINE HCL 50 MG/ML IJ SOLN *I*
INTRAMUSCULAR | Status: AC | PRN
Start: 2017-02-11 — End: 2017-02-11
  Administered 2017-02-11: 50 mg via INTRAVENOUS

## 2017-02-11 MED ORDER — FENTANYL CITRATE 50 MCG/ML IJ SOLN *WRAPPED*
INTRAMUSCULAR | Status: AC | PRN
Start: 2017-02-11 — End: 2017-02-11
  Administered 2017-02-11: 25 ug via INTRAVENOUS
  Administered 2017-02-11: 50 ug via INTRAVENOUS
  Administered 2017-02-11: 25 ug via INTRAVENOUS

## 2017-02-11 MED ORDER — MIDAZOLAM HCL 5 MG/5ML IJ SOLN *I*
INTRAMUSCULAR | Status: AC
Start: 2017-02-11 — End: 2017-02-11
  Filled 2017-02-11: qty 10

## 2017-02-11 MED ORDER — FENTANYL CITRATE 50 MCG/ML IJ SOLN *WRAPPED*
INTRAMUSCULAR | Status: AC
Start: 2017-02-11 — End: 2017-02-11
  Filled 2017-02-11: qty 4

## 2017-02-11 MED ORDER — DIPHENHYDRAMINE HCL 50 MG/ML IJ SOLN *I*
INTRAMUSCULAR | Status: AC
Start: 2017-02-11 — End: 2017-02-11
  Filled 2017-02-11: qty 1

## 2017-02-11 MED ORDER — FOLIC ACID 1 MG PO TABS *I*
1.0000 mg | ORAL_TABLET | Freq: Every day | ORAL | 2 refills | Status: AC
Start: 2017-02-11 — End: 2017-05-12

## 2017-02-11 NOTE — Progress Notes (Signed)
AVS reviewed with pt., verbalized understanding. Meds delivered to pt., all belongings with pt. At bedside awaiting mother to come pick him up. Pt. Aware to f/u with hematology and GI per instructions.

## 2017-02-11 NOTE — Plan of Care (Signed)
Mobility    • Patient's functional status is maintained or improved Maintaining        Pain/Comfort    • Patient's pain or discomfort is manageable Maintaining          Nutrition    • Patient's nutritional status is maintained or improved Progressing towards goal

## 2017-02-11 NOTE — Consults (Addendum)
Hematology Consult Note    Consult attending: Dr. Hermelinda Dellen    Reason for consult: sickle cell patient with anemia, low reticulocyte count    History of Present Illness:  HPI  Cameron [name] is a 21 y.o. 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    He has a history of sickle cell disease.  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  He takes minimal opiates and NSAIDs, mostly for some back pain.  He denies having a pain crisis out side of when he had gallstones, for which he underwent cholecystectomy.      Hgb electropheresis in 2011 and 2015 show ~64% hgbS and 34% HgbF; he is not on hydroxyurea.      He reports he has two parents with sickle cell trait.  He has 3 siblings; one older sister does have trouble with pain crises requiring hospitalization.  He denies personal history of stroke or other sickle cell complication.  He and his immediate family are all born in the [place]; he denies ancestors from the [place], [place], or [place] areas.      On admission he was quite anemic, h/h 5.9/20, down from prior baseline around 10.5/31 (last checked 04/30/16).  His reticulocyte count was also inappropriately normal, and actually has become low during this hospitalization.  (again, baseline is a high retic count as generally seen in sickle cell disease).  Iron, b12, folate with rather low folate level.  He has received 3u pRBC with improvement and stability in H/H.  He was on IV PPI BID.  He has undergone and EGD colonoscopy without clear lesions.  His pain has improved and he has not experienced further bleeding.    Retics remain low, but H/H is good.  He would like to go  home.        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       diphenhydrAMINE (BENADRYL) 50 mg/mL injection     Date Action Dose Route User    Discharged on 02/11/2017    02/11/2017 1607 Given 50 mg Intravenous Gigi Gin, RN      fentaNYL (SUBLIMAZE) 50 mcg/mL injection     Date Action Dose Route User    Discharged on 02/11/2017    02/11/2017 3710 Given 25 mcg Intravenous Gigi Gin, RN    02/11/2017 6269 Given 25 mcg Intravenous Gigi Gin, RN    02/11/2017 4854 Given 50 mcg Intravenous Gigi Gin, RN      folic acid injection 1 mg     Date Action Dose Route User    Discharged on 02/11/2017    02/11/2017 0757 Given 1 mg Intravenous Lavera Guise, RN      midazolam (VERSED) injection     Date Action Dose Route User    Discharged on 02/11/2017    02/11/2017 0928 Given 2 mg Intravenous Gigi Gin, RN    02/11/2017 6270 Given 2 mg Intravenous Gigi Gin, RN    02/11/2017 3500 Given 2 mg Intravenous Gigi Gin, RN  pantoprazole (PROTONIX) 4 mg/ml injection 40 mg     Date Action Dose Route User    Discharged on 02/11/2017    02/11/2017 0255 Given 40 mg Intravenous Kostakis, Margreta Journey, RN          Review of Systems:  General: denies fevers/chills, fatigue, night sweats, unexpected weight change,infections  HEENT: denies vision/hearing changes, mouth sores  Pulm: denies cough, wheezing, shortness of breath  CV: denies chest pain, palpitations  GI: +abdominal pain, blood in stool prior to admission.  No vomiting.  Extremities: denies swelling, muscle pain, joint pain +occ back pain   Heme: denies easy bruising, bleeding, lumps/lymphandeopathy  Skin: denies rashes, changes in hair/skin/nails    Physical Examination:  Vitals:    02/11/17 1202   BP: 129/79   Pulse: 91   Resp: 16   Temp: 36.2 C (97.2 F)   Weight:    Height:      Gen: No acute distress, nontoxic appearance, alert and oriented   HEENT: normocephalic, no scleral  icterus, extraocular movements intact   CV: regular rate and rhythm, no murmurs/gallops/rubs  Pulm: clear to ascultation bilaterally, unlabored.  No wheezes or ronchi.  GI: Abdomen soft, nontender, nondistended, bowel sounds positive.  No organomegally.  Extremities: no lower extremity edema, no cyanosis or clubbing  Onc: no lymphadenoapthy, no bruising  Skin: no rashes    Lab Results:         Lab results: 02/11/17  0400 02/10/17  1621 02/10/17  0435  02/09/17  0049   WBC 6.2  --  5.0  --  6.2   Hemoglobin 8.4* 8.0* 7.1*  < > 7.0*   Hematocrit 28* 27* 23*  < > 23*   RBC 4.4*  --  3.7*  --  3.7*   Platelets 341*  --  317  --  302   Neut # K/uL 3.8  --  3.1  --  3.7   Lymph # K/uL 1.5  --  1.3  --  1.5   Mono # K/uL 0.6  --  0.5  --  0.7   Eos # K/uL 0.3  --  0.2  --  0.3   Baso # K/uL 0.0  --  0.0  --  0.0   Seg Neut % 60.9  --  61.1  --  60.2   Lymphocyte % 24.5  --  24.8  --  24.6   Monocyte % 9.1  --  9.1  --  10.5   Eosinophil % 4.7  --  4.6  --  4.0   Basophil % 0.3  --  0.2  --  0.5   < > = values in this interval not displayed.              Lab results: 02/11/17  0400 02/10/17  0435 02/09/17  0047  02/07/17  2014 04/30/16  0041   Sodium 137 137 140  < >  --  139   Potassium 3.8 3.7 4.0  < >  --  4.0   Chloride 100 102 106  < >  --  101   CO2 '22 25 23  ' < >  --  25   UN 6 4* 5*  < >  --  9   Creatinine 0.91 0.93 0.84  < > 0.87 0.87   GFR,Caucasian 121 118 126  < >  --  125   GFR,Black 140 136 146  < >  --  145   Glucose 73  80 71  < >  --  105*   Calcium 9.4 8.8* 8.5*  < >  --  9.4   Total Protein  --   --   --   --  8.3* 8.0*   Albumin  --   --   --   --  3.7 4.3   ALT  --   --   --   --  21 38   AST  --   --   --   --  13 38   Alk Phos  --   --   --   --  91 75   Bilirubin,Total  --   --   --   --  0.7 2.2*   < > = values in this interval not displayed.          Lab results: 02/10/17  0435   Protime 15.5*   INR 1.3*         Fibrinogen   Date Value Ref Range Status   11/19/2012 362 172 - 409 mg/dL Final              Lab results: 02/11/17  0400 02/09/17  0610 02/08/17  0749 02/08/17  0245 02/07/17  2014 04/30/16  0041   Iron  --   --  77  --   --   --    TIBC  --   --  329  --   --   --    Transferrin Saturation  --   --  23  --   --   --    Transferrin  --   --  254  --   --   --    Folate  --   --  3.7*  --   --   --    Vitamin B12  --   --  317  --   --   --    Retic Abs 32.5* 45.8  --  44.2 60.4 193.0*         Imaging:  Ct Abdomen And Pelvis Without And With Contrast    Result Date: 02/07/2017  1. No CT evidence of active GI bleeding. No findings to suggest inflammatory bowel disease. 2. Status post cholecystectomy. Focally dilated duct in segment 2 of the liver, new compared to the prior MRCP. Findings may relate to stricture. The extrahepatic duct is normal in caliber and note is made of normal serum bilirubin. 3. Decreased attenuation of the cardiac blood pool compatible with anemia. END OF IMPRESSION I have personally reviewed the image(s) and the resident's interpretation and agree with or edited the findings. [facility] submits this DICOM format image data and final report to the [facility], an independent secure electronic health information exchange, on a reciprocally searchable basis (with patient authorization) for a minimum of 12 months after exam date.     Pathology:   Peripheral smear 02/11/17  WBC: normogranular neutrophils, small blue lymphocytes, segmented eosinophils, one basophil.  Few monocytes  RBC: remarkable for numerous targets, size and shape variability.  Several sickled cells.    Platelets: No clumping.  Normal size and staining.    Impression: +sickle cells, rather high amount of targets and fewer sickled cells, perhaps due to protective effects of hgbF      Assessment:   Cameron [name] is a 21 y.o. 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  Interestingly his  smear has a lot of target cells which raises a question of a concomitant alpha thalassemia.  Regardless, his acute issue is anemia (presumably due to GI bleeding) with inappropriately normal or even low reticulocytes; at baseline his reticulocytes count is high.  His Hgb has come up nicely though and his H/H is stable.  He would like to go home and we agree this is reasonable but will have to follow up counts closely to see if retics come back up.  He did have low folic acid and has started on replacement, though it is unclear how much this is contributing.  EBV negative, parvovirus pending.    He has close follow up with his PCP, who will check his blood counts on Wed.  He should have CBC and retic counts twice a week until both start to come up.  We will request hematology follow up in the next 1-2 weeks as well.    Recommendations:  -- OK to discharge with close outpatient follow up  -- agree with folic acid replacement  -- check CBC, retic count twice a week until improving  -- placed referral for hematology clinic in case retics don't recover rapidly      Thank you for this consult, we will continue to follow.  Please page with any questions.    The patient was seen and discussed in detail with my attending, Dr. Kathee Polite, MD [facility] [phone]  Hematology/Oncology Fellow

## 2017-02-11 NOTE — H&P (View-Only) (Signed)
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     Past Surgical History:   Procedure Laterality Date    CHOLECYSTECTOMY  2014     Family History   Problem Relation Age of Onset    Other Mother     Sickle cell anemia Sister     Sickle cell anemia Brother     Sickle cell anemia Brother      Social History     Social History    Marital status: Single     Spouse name: N/A    Number of children: N/A    Years of education: N/A     Social History Main Topics    Smoking status: Never Smoker    Smokeless tobacco: Never Used    Alcohol use No    Drug use: No    Sexual activity: Not Asked     Other Topics Concern    None     Social History Narrative    06/09/15- Lives at home with mom, brother (24 yrs, 78 yrs), sister (71 yrs) and nephew (4 yrs).        Allergies:  No Known Allergies (drug, envir, food or latex)    Medications:    (Not in a hospital admission)   Current Facility-Administered Medications   Medication Dose Route Frequency    pantoprazole (PROTONIX) 4 mg/ml injection 40 mg  40 mg Intravenous Q12H    sodium chloride 0.9 % IV  150 mL/hr Intravenous Continuous    oxyCODONE (ROXICODONE) IR tablet 10 mg  10 mg Oral Q4H PRN    sodium chloride 0.9 % IV  3 mL/hr Intravenous Continuous    folic acid injection 1 mg  1 mg Intravenous Daily    sodium chloride 0.9 % IV  100 mL/hr Intravenous Continuous     Current Outpatient Prescriptions   Medication    oxyCODONE (ROXICODONE) 10 MG immediate release tablet    ibuprofen (ADVIL,MOTRIN) 086 MG tablet    folic acid (FOLVITE) 1 MG tablet    naloxone (NARCAN) 4 mg/0.1 mL nasal spray    clindamycin-benzoyl peroxide (BENZACLIN) gel    dextromethorphan-guaifenesin (MUCINEX DM) 30-600 MG per 12 hr tablet    Electronic Thermometer (DIGITAL THERMOMETER) MISC     Vitals:    02/08/17 1626    BP: 129/64   Pulse: 77   Resp: 17   Temp: 37.1 C (98.8 F)   Weight:    Height:        ROS:  GI:See HPI.    Physical Examination:  Head/Nose/Throat:negative  Lungs:Lungs clear  Cardiovascular:normal S1 and S2  Abdomen: abdomen soft, non-tender, nondistended, normal active bowel sounds, no masses or organomegaly      Lab Results:   All labs in the last 24 hours:   Recent Results (from the past 24 hour(s))   CBC and differential    Collection Time: 02/07/17  8:14 PM   Result Value Ref Range    WBC 6.8 4.2 - 9.1 THOU/uL    RBC 3.5 (L) 4.6 - 6.1 MIL/uL    Hemoglobin 5.9 (L) 13.7 - 17.5 g/dL    Hematocrit 20 (L) 40 - 51 %    MCV 59 (L) 79 - 92 fL    MCH 17 (L)  26 - 32 pg/cell    MCHC 29 (L) 32 - 37 g/dL    RDW 20.0 (H) 11.6 - 14.4 %    Platelets 418 (H) 150 - 330 THOU/uL    Seg Neut % 58.0 %    Lymphocyte % 31.0 %    Monocyte % 7.6 %    Eosinophil % 3.2 %    Basophil % 0.1 %    Neut # K/uL 4.0 1.8 - 5.4 THOU/uL    Lymph # K/uL 2.1 1.3 - 3.6 THOU/uL    Mono # K/uL 0.5 0.3 - 0.8 THOU/uL    Eos # K/uL 0.2 0.0 - 0.5 THOU/uL    Baso # K/uL 0.0 0.0 - 0.1 THOU/uL    Nucl RBC % 0.1 0.0 - 0.2 /100 WBC    Nucl RBC # K/uL 0.0 0.0 - 0.0 THOU/uL    IMM Granulocytes # 0.0 0.0 - 0.1 THOU/uL    IMM Granulocytes 0.1 %   Plasma profile 7 [facility] ED only)    Collection Time: 02/07/17  8:14 PM   Result Value Ref Range    Chloride,Plasma 97 96 - 108 mmol/L    CO2,Plasma 24 20 - 28 mmol/L    Potassium,Plasma 3.9 3.4 - 4.7 mmol/L    Sodium,Plasma 131 (L) 133 - 145 mmol/L    Anion Gap,PL 10 7 - 16    UN,Plasma 7 6 - 20 mg/dL    Creatinine 0.87 0.50 - 1.00 mg/dL    GFR,Caucasian 125 *    GFR,Black 144 *    Glucose,Plasma 88 60 - 99 mg/dL   Type and screen    Collection Time: 02/07/17  8:14 PM   Result Value Ref Range    ABO RH Blood Type O RH POS     Antibody Screen Negative    Hold blue    Collection Time: 02/07/17  8:14 PM   Result Value Ref Range    Hold Blue HOLD TUBE    Reticulocytes    Collection Time: 02/07/17  8:14 PM   Result Value Ref  Range    Retic % 1.8 0.7 - 2.3 %    Retic Abs 60.4 33.8 - 124.0 THOU/uL   Lactate dehydrogenase    Collection Time: 02/07/17  8:14 PM   Result Value Ref Range    LD 322 (H) 118 - 225 U/L   Sedimentation rate, automated    Collection Time: 02/07/17  8:14 PM   Result Value Ref Range    Sedimentation Rate 49 (H) 0 - 15 mm/hr   C reactive protein    Collection Time: 02/07/17  8:14 PM   Result Value Ref Range    CRP 13 (H) 0 - 10 mg/L   RUQ panel (ED only)    Collection Time: 02/07/17  8:14 PM   Result Value Ref Range    Amylase 80 28 - 100 U/L    Lipase 15 13 - 60 U/L    Total Protein 8.3 (H) 6.3 - 7.7 g/dL    Albumin 3.7 3.5 - 5.2 g/dL    Bilirubin,Total 0.7 0.0 - 1.2 mg/dL    Bilirubin,Direct <0.2 0.0 - 0.3 mg/dL    Alk Phos 91 65 - 260 U/L    AST 13 0 - 50 U/L    ALT 21 0 - 50 U/L   RBC morphology    Collection Time: 02/07/17  8:14 PM   Result Value Ref Range    RBC Morphology RESULTS  Anisocytosis Present (!)     Microcytes Present (!)     Hypochromia Present (!)     Schistocytes Present (!)     Ovalocytes Present (!)     Target Cells Present (!)    Haptoglobin    Collection Time: 02/07/17  8:14 PM   Result Value Ref Range    Haptoglobin 57 30 - 200 mg/dL   Red blood cells    Collection Time: 02/07/17  9:16 PM   Result Value Ref Range    Blood product type Red Blood Cells     Unit Number [id number]     Dispense status Issued     Product code [id number]     Component blood type O Pos     Coding system [id number]     Coded Blood type 5100    HCT and Hgb    Collection Time: 02/08/17  2:45 AM   Result Value Ref Range    Hemoglobin 5.6 (L) 13.7 - 17.5 g/dL    Hematocrit 19 (LL) 40 - 51 %   MCHC    Collection Time: 02/08/17  2:45 AM   Result Value Ref Range    MCHC 30 (L) 32 - 37 g/dL   Duplicate slide    Collection Time: 02/08/17  2:45 AM   Result Value Ref Range    Slide Sent 9:10     Slide Sent To 7-14    Reticulocytes    Collection Time: 02/08/17  2:45 AM   Result Value Ref Range    Retic % 1.4 0.7 - 2.3 %    Retic  Abs 44.2 33.8 - 124.0 THOU/uL   Red blood cells    Collection Time: 02/08/17  3:31 AM   Result Value Ref Range    Blood product type Red Blood Cells     Unit Number [id number]     Dispense status Issued     Product code [id number]     Component blood type O Neg     Coding system [id number]     Coded Blood type 8546    Basic metabolic panel    Collection Time: 02/08/17  7:49 AM   Result Value Ref Range    Glucose 84 60 - 99 mg/dL    Sodium 136 133 - 145 mmol/L    Potassium 4.1 3.3 - 5.1 mmol/L    Chloride 101 96 - 108 mmol/L    CO2 23 20 - 28 mmol/L    Anion Gap 12 7 - 16    UN 5 (L) 6 - 20 mg/dL    Creatinine 0.87 0.50 - 1.00 mg/dL    GFR,Caucasian 125 *    GFR,Black 144 *    Calcium 8.8 (L) 9.3 - 10.5 mg/dL   CBC and differential    Collection Time: 02/08/17  7:49 AM   Result Value Ref Range    WBC 7.6 4.2 - 9.1 THOU/uL    RBC 3.8 (L) 4.6 - 6.1 MIL/uL    Hemoglobin 6.9 (L) 13.7 - 17.5 g/dL    Hematocrit 23 (L) 40 - 51 %    MCV 61 (L) 79 - 92 fL    MCH 18 (L) 26 - 32 pg/cell    MCHC 30 (L) 32 - 37 g/dL    RDW 22.0 (H) 11.6 - 14.4 %    Platelets 389 (H) 150 - 330 THOU/uL    Seg Neut % 72.8 %    Lymphocyte % 15.9 %  Monocyte % 8.3 %    Eosinophil % 2.4 %    Basophil % 0.3 %    Neut # K/uL 5.5 (H) 1.8 - 5.4 THOU/uL    Lymph # K/uL 1.2 (L) 1.3 - 3.6 THOU/uL    Mono # K/uL 0.6 0.3 - 0.8 THOU/uL    Eos # K/uL 0.2 0.0 - 0.5 THOU/uL    Baso # K/uL 0.0 0.0 - 0.1 THOU/uL    Nucl RBC % 0.0 0.0 - 0.2 /100 WBC    Nucl RBC # K/uL 0.0 0.0 - 0.0 THOU/uL    IMM Granulocytes # 0.0 0.0 - 0.1 THOU/uL    IMM Granulocytes 0.3 %     *Note: Due to a large number of results and/or encounters for the requested time period, some results have not been displayed. A complete set of results can be found in Results Review.       Radiology impressions (last 30 days):  Ct Abdomen And Pelvis Without And With Contrast    Result Date: 02/07/2017  02/07/2017 10:16 PM  CT ABDOMEN AND PELVIS WITHOUT AND WITH CONTRAST REASON FOR STUDY:  Brbpr, Abd Pain.  Sickle Cell Disease. ? New Inflammatory Bowel Disease ;  DEMOGRAPHICS:  Cameron [name] is a 21 years Male. ADDITIONAL CLINICAL INFORMATION:   None.  COMPARISON:  MRCP dated 09/26/2012. Right upper quadrant ultrasound dated 10/22/2012. PROCEDURE:  Contiguous images were obtained through the abdomen and pelvis without and with intravenous contrast.  Automated exposure control, adjustment of the mA and/or kV according to patient size, and/or iterative reconstruction techniques were utilized for radiation dose optimization.  Amount and type of contrast that was injected and/or discarded is recorded in the electronic medical record. FINDINGS ABDOMEN: Chest Base: Diffuse decreased attenuation of the cardiac blood pool compatible with anemia. Liver/Biliary Tract: No focal liver lesions. Status post cholecystectomy. Periportal edema. Focally dilated duct in segment 2. Pancreas: Unremarkable. Spleen: Borderline splenomegaly. Adrenals: Unremarkable. Kidneys and Collecting Systems: Unremarkable. Lymph Nodes: There are several mildly prominent mesenteric lymph nodes. For example anterior mesenteric lymph node measures 1.0 cm on series 202 image 82. Vessels: Unremarkable. Abdominal GI Tract/Mesentery and Peritoneal Cavity: No bowel wall thickening or abnormal distention. No free air or free fluid. Normal appendix. Soft Tissues/Musculoskeletal: No acute skeletal disease. FINDINGS PELVIS: Prostate Glands/Seminal Vesicles: Unremarkable. Bladder: Unremarkable. Lymph Nodes: Unremarkable. Vessels: Unremarkable. Pelvic GI Tract/Mesentery and Peritoneal Cavity: No bowel wall thickening or abnormal distention. No free fluid in the pelvis. Normal appendix. No evidence of GI bleeding. Soft Tissues/Musculoskeletal: No acute soft tissue or osseous abnormalities.     1. No CT evidence of active GI bleeding. No findings to suggest inflammatory bowel disease. 2. Status post cholecystectomy. Focally dilated duct in segment 2 of the liver, new  compared to the prior MRCP. Findings may relate to stricture. The extrahepatic duct is normal in caliber and note is made of normal serum bilirubin. 3. Decreased attenuation of the cardiac blood pool compatible with anemia. END OF IMPRESSION I have personally reviewed the image(s) and the resident's interpretation and agree with or edited the findings. [facility] submits this DICOM format image data and final report to the [facility], an independent secure electronic health information exchange, on a reciprocally searchable basis (with patient authorization) for a minimum of 12 months after exam date.       Currently Active Problems:  Patient Active Problem List   Diagnosis Code    Sickle cell disease with HPFH D57.1, D56.4  Back pain M54.9    GI bleeding K92.2            UPDATES TO PATIENT'S CONDITION on the DAY OF SURGERY/PROCEDURE    I. Updates to Patient's Condition (to be completed by a provider privileged to complete a H&P, following reassessment of the patient by the provider):    Full H&P done today; no updates needed.    II. Procedure Readiness   I have reviewed the patient's H&P and updated condition. By completing and signing this form, I attest that this patient is ready for surgery/procedure.      III. Attestation   I have reviewed the updated information regarding the patient's condition and it is appropriate to proceed with the planned surgery/procedure.    Izora Gala, MD as of 4:42 PM 02/08/2017

## 2017-02-11 NOTE — Discharge Summary (Signed)
Name: Cameron [name] MRN: [id number] DOB: 1995/12/17     Admit Date: 02/07/2017   Date of Discharge: 02/11/2017    Patient was accepted for discharge to   Home or Self Care [1]           Discharge Attending Physician: Raiford NobleMAHLER, STEWART      Hospitalization Summary    CONCISE NARRATIVE:   Cameron [name] is a 21 year old man with HgB SS, who was [facility] for anemia in the setting of 1 month of abdominal pain and bloody stools.  He was found to be profoundly anemic with HgB of 5.6 on presentation.  He was transfused, and did have appropriate incrementation and his counts remained stable.  He underwent upper and lower endoscopy which did not reveal a source of bleed.    Of note, he had a low reticulocyte count at admission.  He had serologies sent out for EBV and parvovirus B19, both of which are pending as of time of writing.  Of note, his folate was low in house. He was given IV folate and advised to continue on his oral folate.  He was evaluated in house by hematology, who will follow up with him outpatient and monitor blood counts twice weekly.    He has follow up with PCP on Wednesday.  Heme will make appointment for him.        ENDOSCOPY:   Colonoscopy 5/21:     Mild edema/inflammation from sigmoid to cecum. This is unlikely   the source of bleeding   Rectum and terminal ileum appeared normal.    EGD 5/18:    Impression(s):  1. Normal examination   2. No ulcers, erosions or bleeding stigmata seen   3. Bile seen in the duodenum    CT RESULTS:   1. No CT evidence of active GI bleeding. No findings to suggest inflammatory bowel disease.    2. Status post cholecystectomy. Focally dilated duct in segment 2 of the liver, new compared to the prior MRCP. Findings may relate to stricture. The extrahepatic duct is normal in caliber and note is made of normal serum bilirubin.    3. Decreased attenuation of the cardiac blood pool compatible with anemia.    PENDING BLOOD RESULTS: EBV and Parvo serologies  PENDING  PATHOLOGY RESULTS: Bx from endoscopy pending.  SIGNIFICANT MED CHANGES: None     CONSULTANT SERVICE   [place]                  Signed: Hermenia BersMatthew Harrison Khayri Kargbo, MD  On: 02/11/2017  at: 4:42 PM

## 2017-02-11 NOTE — Telephone Encounter (Signed)
Jill AlexandersJustin, medical resident, called requesting to speak with PCP regarding patient discharge.   Jill AlexandersJustin is requesting a call back to "team phone" [phone].  Writer paged NP.  Patient scheduled Wed 5/23 @ 3pm.

## 2017-02-11 NOTE — Discharge Instructions (Signed)
Brief Summary of Your Hospital Course (including key procedures and diagnostic test results):  [facility] after you noted 1 month of abdominal pain and dark bowel movements with fresh blood. Due to your history of ibuprofen use and anemia, we suspected gastritis caused by NSAIDS. Your red blood cell levels were very low when you arrived in the hospital and you were ultimately transfused 3 times with red blood cells to bring your counts back up.  We had gastroenterology on board to rule out a bleed either from your upper or lower gastric tract. They did imaging studies of both your upper and lower GI tract.   The upper endoscopy [EGD] did not show any ulcers or active bleed.   The colonoscopy likewise did not show any active bleeding, although it noted some inflammation that was unlikely to be the source of the bleeding, as well as some small internal hemorrhoids.   We also had hematology involved, due to the concern for your bone marrow mounting a poor response to anemia. The reticulocyte[a type of young red blood cell] count was lower than it should have been. Hematology suggested that you follow up with them out patient.    Your instructions:  Follow up with Dr Gaylyn [facility]erseinhardt at [facility] Wednesday (5/23, 3 PM).   Have non fasting lab work performed beforehand to test your blood count on 5/23  Do not use ibuprofen/aspirin/aleve/other NSAIDs in the future for aches/pains/fever (can use tylenol)   Follow up with [facility] for colonoscopy biopsy results. Call them for an appointment in 3 months.  Hematology will call you for an appointment to discuss your bone marrow function.     Recommended diet: Regular - No restrictions    Recommended activity: activity as tolerated. Do not drive today, as you had your colonoscopy.     If you experience any of these symptoms within the first 24 hours after discharge:Fever of 101 F. or greater, Poor feeding, Poor urinary output, Vomiting,  Nausea, Blood in stool, Loss of consciousness or Weakness  please follow up with the discharge attending Dr. Raiford NobleStewart Mahler at phone-number: [phone]    If you experience any of these symptoms 24 hours or more after discharge:Poor feeding, Poor urinary output, Vomiting, Nausea, Blood in stool, Loss of consciousness or Weakness  please follow up with your PCP:  Delrae AlfredPulcino, Tiffany Lynn, MD [phone]

## 2017-02-11 NOTE — Progress Notes (Signed)
Patient was discharged, peripheral IV was pulled, education and care plan were completed. Paper work was completed and patient had no further questions. He ambulated to the main lobby where he was being picked up by his mother.   Irish EldersMartika Taisia Fantini, RN

## 2017-02-11 NOTE — Student Note (Signed)
HOSPITAL MEDICINE PROGRESS NOTE:    CC: Patient is a 21 year old male with a history of sickle cell disease SS presenting to the hospital with severe anemia and reticulocytopenia secondary to 62-month history of abdominal pain and bloody stools.     O/N: No events    Subjective: Patient is doing well this morning. He denies any abdominal pain. He denies any chest pain, SOB, fever, chills. He attests to regular bowel movements with no blood. He has been ambulating regularly around the unit with no lightheadedness on getting out of bed. He denies nausea, vomiting, confusion, pain in his extremities    Objective:    Vitals: all stable  BP: (114-136)/(55-90)   Temp:  [36 C (96.8 F)-36.7 C (98.1 F)]   Heart Rate:  [60-87]   Resp:  [16-18]   SpO2:  [100 %] on room air    Exam:   General: NAD, pleasant  Cardiac: RRR, S1 + S2 observed, no murmur  Pulmonary: Lungs bilaterally clear to auscultation  Abdomen: soft + not distended, normoactive bowel sounds, no pain on palpation in all four quadrants  Extremities: No swelling/discoloration    Labs: Notable for uptrending H/H despite ongoing severe microcytic anemia. Still severely reticulocytopenic. Still no result on EBV (never collected) or Parvovirus B19 (in process).    Lab 02/11/17  0400 02/10/17  0435 02/09/17  0047   Sodium 137 137 140   Potassium 3.8 3.7 4.0   Chloride 100 102 106   CO2 22 25 23    UN 6 4* 5*   Creatinine 0.91 0.93 0.84   Glucose 73 80 71   Calcium 9.4 8.8* 8.5*     Lab 02/11/17  0400 02/10/17  1621 02/10/17  0435 02/09/17  0049   WBC 6.2  --  5.0 6.2   Hemoglobin 8.4* 8.0* 7.1* 7.0*   Hematocrit 28* 27* 23* 23*   Platelets 341*  --  317 302     Lab 02/10/17  0435   INR 1.3*     Lab 02/11/17  0400 02/10/17  0435 02/09/17  0047   GFR,Black 140 136 146      Meds:   pantoprazole  40 mg Intravenous Q12H    folic acid  1 mg Intravenous Daily     Continuous Infusions:   lactated ringers       PRN Meds:.oxyCODONE    Assessment/Plan:   Patient is a 21 year  old male with a history of sickle cell disease SS presenting to the hospital with severe anemia and reticulocytopenia secondary to 29-month history of abdominal pain and bloody stools.     Anemia: Presumed secondary to GI bleed based on history of hematochezia. Patient's H/H have been trending upwards and he did not require a transfusion over the weekend, leaning towards stabilization of the bleed, however he remains profoundly reticulocytopenic. WBC/platelets are normal. We still do not have a result for EBV or Parvovirus 19 antigen, two common causes of aplastic crisis. He had no findings on EGD.   Plan:  - colonoscopy today to assess for LGIB source, patient is NPO, bowel prep performed on Sunday per GI's recs  - consider consult to heme in setting of consistently depressed retic count   - consider dropping H/H assessment, moving to only daily CBCs  - transfuse for H/H < 7/21    Sickle cell SS disease: Patient has not had a pain crisis for 3 years. No chest pain, fever, SOB, or any clinical information  concerning for acute chest syndrome  - oxycodone for severe pain PRN    This is a Psychologist, occupationalmedical student note. Please refer to resident/attending note for the complete plan.    Cameron [name]

## 2017-02-11 NOTE — Procedures (Addendum)
Colonoscopy Procedure Note   Date of Procedure: 02/11/2017   Referring Physician: No ref. provider found  Primary Physician: Delrae AlfredPulcino, Tiffany Lynn, MD        Attending Physician: Wilhemena Durieanielle Shaniquia Brafford, MD  Fellow:   Corey Haroldaren Taylor, MD  Indications:  Hematochezia/bright red blood per rectum  Previous colonoscopy: No  Medications: Fentanyl 100 mcg IV, Midazolam 6 mg IV and Diphenhydramine 50 mg IV were administered incrementally over the course of the procedure to achieve an adequate level of moderate sedation.    Moderate Sedation Face Times  Start Time: 0921  End Time: 0948  Duration (minutes): 27 Minutes        Scopes: [id number]    Procedure Details: Full disclosures of risks were reviewed with patient as detailed on the consent form. The patient was placed in the left lateral decubitus position and monitored with continuous pulse oximetry, interval blood pressure monitoring and direct observations.   After anorectal examination was performed, the colonoscope was inserted into the rectum and advanced under direct vision to the terminal ileum and cecum, which was identified by  the ileocecal valve and the appendiceal orifice. The cecum was fully inflated, allowing a complete view, including the medial wall between the IC valve and appendiceal orifice. The procedure was considered not difficult.  During withdrawal examination, the final quality of the prep was [facility] Bowel Prep Scale:  Right Colon: Grade3- (entire mucosa of colon segment seen well, with no residual staining, small fragments of stool, or opaque liquid)  Transverse Colon: Grade 3- (entire mucosa of colon segment seen well, with no residual staining, small fragments of stool, or opaque liquid)  Left Colon: Grade 3- (entire mucosa of colon segment seen well, with no residual staining, small fragments of stool, or opaque liquid)  A careful inspection was made as the colonoscope was withdrawn. A retroflexed view of the rectum was performed; findings and  interventions are described below. The patient was recovered in the GI recovery area.     Scope Times:     Insertion Time: (S) 0930  Cecum Time: (S) 0934  Exit Time: [id number]      Findings:    Anorectal:   Normal tone, no masses  Terminal Ileum:   Normal ileal mucosa  Cecum:   Mild edema, loss of vascular markings, and punctate areas of white exudate.  Ascending Colon:   Mild edema, loss of vascular markings, and punctate areas of white exudate.  Transverse Colon:   Mild edema and loss of vascular markings.  Descending Colon:   Mild edema and loss of vascular markings.  Sigmoid:   Mild edema and loss of vascular markings.    Rectum:   Normal rectum throughout    Small internal hemorrhoids      Intervention(s):      Biopsies taken throughout the colon.      Complication(s):     none    Impression(s):     Mild edema/inflammation from sigmoid to cecum. This is unlikely the source of bleeding   Rectum and terminal ileum appeared normal.    Recommendation(s):     Await pathology.   Resume diet   He should f/u in GI clinic in a few months as outpatient. We will arrange. CBC should be followed by his primary doctor.     Corey Haroldaren Taylor, MD       GI ATTENDING    I was present for the entire viewing portion of the exam and participated as needed throughout the procedure,  and I concur with the findings and intervention descriptions as outlined in this edited note.    I provided moderate sedation.  During this time I was face-to-face with the patient and supervising the RN; who monitored the patient's level of consciousness and physiological status.   Sheppard Coil, MD

## 2017-02-11 NOTE — Interval H&P Note (Signed)
UPDATES TO PATIENT'S CONDITION on the DAY OF SURGERY/PROCEDURE    I. Updates to Patient's Condition (to be completed by a provider privileged to complete a H&P, following reassessment of the patient by the provider):    Day of Surgery/Procedure Update:  History  History reviewed and significant changes noted: EGD without source of bleeding    Physical  Physical exam updated and no change    Pre-Procedure Assessment  Airway Visibility: Uvula  Lungs: Clear  Heart sounds rhythm: Regular Rate Rhythm  ASA Physical status rating: Class III: Severe systemic disease       II. Procedure Readiness   I have reviewed the patient's H&P and updated condition. By completing and signing this form, I attest that this patient is ready for surgery/procedure.    III. Attestation   I have reviewed the updated information regarding the patient's condition and it is appropriate to proceed with the planned surgery/procedure.    Wilhemena DurieANIELLE Lindey Renzulli, MD as of 8:25 AM 02/11/2017

## 2017-02-11 NOTE — Progress Notes (Signed)
Medicine Progress Note  Patient Name: Keenan BachelorLucius Granieri III   Admission date: 02/07/2017  Length of stay: 4    Interval History     H/H is stable  No longer having dark BM or epigastric pain. Colonoscopy today.  Retic count down trended.     Objective      Physical Exam  Recent vital signs reviewed and notable for:  Afebrile, normotensive. Nontachy.      General: NAD  HEENT: MMM, sclera anicteric, no oropharyngeal lesions  Neck: No JVD  Cardiac: RRR, S1S2, no murmurs, gallops, or rubs  Lungs: clear to auscultation bilaterally  Abdomen: soft, diffusely tender, non-distended  Extremities: no LEE, W/WP  Neurologic: Grossly intact     Relevant Lab, Micro, and Imaging Studies   Reviewed, H/H stable.  BMP wnl.  Hb 8.4[7.1] HCT 28[23]  R abs 45.8   retic 5/19 1.3             5/21 0.7  INR 1.3  EBV in process  PB19, IG G and M, in process   Haptoglobin wnl    EGD  Impression(s):  1. Normal examination   2. No ulcers, erosions or bleeding stigmata seen   3. Bile seen in the duodenum      Assessment     Zayvion [name] III is a 21 y.o. male with PMH significant for sickle cell disease who was admitted on 02/07/17 with 2 months of diffuse abdominal pain and BRBPR that started after taking Advil and Tylenol. 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 S/p 3 units pRBC. H/H is stable following transfusion    He is s/p normal EGD and will have colonoscopy planned for Mon.    Plan       Dark, bloody stools, abdominal pain.  Now s/p EGD without evidence of ulcer, gastritis in upper GI tract.   -cont H/H to BID  - continue PPI for now  - transfuse for Hgb / Hct < 7 / 21  -Per GI:    - Colonoscopy today to evaluate for lower GI bleeding source         Hgb SS disease   - IV folate given low folate on serum studies   - iron studies, b12 normal   - S pneumo negative. pB19 in process. EBV in process as well   -  Heme aware of patient and will see if retics remain depressed over weekend   - retics today and every 48hr  - retic down trending further    DVT Ppx: IPCs given bleeding    Discharge barriers: medical stability    Full Code    Burnett KanarisSonia Brinlee Gambrell, MBBS  PM&R PGY1  02/11/2017 at 6:10 AM

## 2017-02-12 LAB — SURGICAL PATHOLOGY

## 2017-02-13 ENCOUNTER — Ambulatory Visit: Payer: Medicaid Other | Admitting: Internal Medicine

## 2017-02-13 ENCOUNTER — Telehealth: Payer: Self-pay | Admitting: Primary Care

## 2017-02-13 ENCOUNTER — Telehealth: Payer: Self-pay

## 2017-02-13 LAB — PARVOVIRUS B19 ANTIBODY, IGG AND IGM
Parvovirus B19 IgG: POSITIVE
Parvovirus B19 IgM: NEGATIVE

## 2017-02-13 NOTE — Telephone Encounter (Signed)
Fantastic thank you for that coordination

## 2017-02-13 NOTE — Telephone Encounter (Signed)
-----   Message from Maryla Morrowaitlin Foorpessin, MD sent at 02/12/2017  7:05 AM EDT -----  Regarding: follow-up appt  Can you please schedule patient for follow-up in 8 weeks with fellow taking over for me?    Thank you!  caitlin

## 2017-02-13 NOTE — Telephone Encounter (Signed)
Called patient. Scheduled 8 wk fuv with dr. Elvera Mariacheong for 7/26 at 2pm on ac5. Patient confirmed.

## 2017-02-13 NOTE — Telephone Encounter (Signed)
Patient was referred to adult hematology during inpatient stay but is still followed by pediatric hematology pending college.  Please call adult heme and cancel referral and then notify peds hematology that patient was admitted for gastrointestinal bleed and inquire if they would like to see him in f/u

## 2017-02-13 NOTE — Telephone Encounter (Signed)
Patient is scheduled to [facility] on Friday 02/22/17 at 9:00am.    I spoke with Lorene Dyhristie at Adult Hematology and canceled referral placed by inpatient team.    Peds Hematology [phone]

## 2017-02-14 NOTE — Telephone Encounter (Signed)
I spoke with pt and he rescheduled his missed appt on Wed 02/13/17 to Wed 02/20/17 with Dr. Concha NorwayPulcino.    I also notified patient he is scheduled to [facility] Friday 02/22/17 at 9:00am.

## 2017-02-15 ENCOUNTER — Ambulatory Visit: Payer: Medicaid Other | Admitting: Ophthalmology

## 2017-02-15 ENCOUNTER — Telehealth: Payer: Self-pay | Admitting: Gastroenterology

## 2017-02-15 MED ORDER — MESALAMINE 800 MG PO TBEC *I*
2400.0000 mg | DELAYED_RELEASE_TABLET | Freq: Two times a day (BID) | ORAL | 2 refills | Status: DC
Start: 2017-02-15 — End: 2017-04-30

## 2017-02-15 NOTE — Telephone Encounter (Signed)
Called patient. Doing better symptomatically. Reviewed with him that colon biopsies demonstrate mild chronic colitis. Presentation consistent with UC. Will start patient on Asacol 4.8 grams/day and schedule follow-up in GI clinic to discuss further. Patient in agreement with this plan.

## 2017-02-20 ENCOUNTER — Ambulatory Visit: Payer: Medicaid Other | Attending: Primary Care | Admitting: Primary Care

## 2017-02-20 ENCOUNTER — Encounter: Payer: Self-pay | Admitting: Primary Care

## 2017-02-20 VITALS — BP 124/66 | HR 117 | Ht 72.99 in | Wt 182.4 lb

## 2017-02-20 DIAGNOSIS — D571 Sickle-cell disease without crisis: Secondary | ICD-10-CM | POA: Insufficient documentation

## 2017-02-20 DIAGNOSIS — K529 Noninfective gastroenteritis and colitis, unspecified: Secondary | ICD-10-CM | POA: Insufficient documentation

## 2017-02-20 DIAGNOSIS — K5190 Ulcerative colitis, unspecified, without complications: Secondary | ICD-10-CM | POA: Insufficient documentation

## 2017-02-20 LAB — CBC AND DIFFERENTIAL
Baso # K/uL: 0 10*3/uL (ref 0.0–0.1)
Basophil %: 0.4 %
Eos # K/uL: 0.2 10*3/uL (ref 0.0–0.5)
Eosinophil %: 2.3 %
Hematocrit: 28 % — ABNORMAL LOW (ref 40–51)
Hemoglobin: 8.1 g/dL — ABNORMAL LOW (ref 13.7–17.5)
IMM Granulocytes #: 0 10*3/uL (ref 0.0–0.1)
IMM Granulocytes: 0.1 %
Lymph # K/uL: 1.5 10*3/uL (ref 1.3–3.6)
Lymphocyte %: 17.6 %
MCH: 19 pg/cell — ABNORMAL LOW (ref 26–32)
MCHC: 30 g/dL — ABNORMAL LOW (ref 32–37)
MCV: 66 fL — ABNORMAL LOW (ref 79–92)
Mono # K/uL: 0.8 10*3/uL (ref 0.3–0.8)
Monocyte %: 8.9 %
Neut # K/uL: 5.9 10*3/uL — ABNORMAL HIGH (ref 1.8–5.4)
Nucl RBC # K/uL: 0 10*3/uL (ref 0.0–0.0)
Nucl RBC %: 0 /100 WBC (ref 0.0–0.2)
Platelets: 367 10*3/uL — ABNORMAL HIGH (ref 150–330)
RBC: 4.2 MIL/uL — ABNORMAL LOW (ref 4.6–6.1)
RDW: 28 % — ABNORMAL HIGH (ref 11.6–14.4)
Seg Neut %: 70.7 %
WBC: 8.4 10*3/uL (ref 4.2–9.1)

## 2017-02-20 LAB — RBC MORPHOLOGY

## 2017-02-20 LAB — SEDIMENTATION RATE, AUTOMATED: Sedimentation Rate: 71 mm/hr — ABNORMAL HIGH (ref 0–15)

## 2017-02-20 LAB — CRP: CRP: 8 mg/L (ref 0–10)

## 2017-02-20 LAB — RETICULOCYTES
Retic %: 0.5 % — ABNORMAL LOW (ref 0.7–2.3)
Retic Abs: 20.5 10*3/uL — ABNORMAL LOW (ref 33.8–124.0)

## 2017-02-20 NOTE — Progress Notes (Signed)
[facility]  [address]  [place] [id number]  Phone: [phone]  Fax: [phone]    REASON FOR VISIT     Sickle Cell Pain Crisis      PRIMARY DIAGNOSIS       SUBJECTIVE     Sudden  Onset of crmaping abdominal pain at work at stooled blood   Has been having abdominal pain for about a month  Still having a lot boody stool and received  No treatment to   Still having intermittant abdominal pain and increased stool output but not bloody    Using pain medication about once a day    Medications Reviewed and changes     Current Outpatient Prescriptions:     mesalamine (ASACOL HD) 800 MG tablet, Take 3 tablets (2,400 mg total) by mouth 2 times daily, Disp: 180 tablet, Rfl: 2    folic acid (FOLVITE) 1 MG tablet, Take 1 tablet (1 mg total) by mouth daily, Disp: 30 tablet, Rfl: 2    oxyCODONE (ROXICODONE) 10 MG immediate release tablet, Take 1 tablet (10 mg total) by mouth every 4 hours as needed for Pain   Max daily dose: 60 mg, Disp: 40 tablet, Rfl: 0    naloxone (NARCAN) 4 mg/0.1 mL nasal spray, Instill 1 spray in 1 nostril once for opioid reversal. Repeat in alternating nostrils with new package every 2-3 minutes until response, Disp: 2 each, Rfl: 5    clindamycin-benzoyl peroxide (BENZACLIN) gel, Apply topically 2 times daily, Disp: 25 g, Rfl: 5    Electronic Thermometer (DIGITAL THERMOMETER) MISC, Use as directed, Disp: 1 each, Rfl: 0  Review of Systems  OBJECTIVE     BP 124/66   Pulse (!) 117   Ht 1.854 m (6' 0.99")   Wt 82.7 kg (182 lb 6.4 oz)   SpO2 100%   BMI 24.07 kg/m2  Physical Exam  ASSESSMENT / DIAGNOSIS     There are no diagnoses linked to this encounter.    There are no Patient Instructions on file for this visit.  --Patient instructed to call if symptoms are not improving or worsening  --Follow-up arranged    Electronically signed by Benay PillowIFFANY L Lilinoe Acklin, MD 02/20/2017 11:26 AM  [facility], Phone: [phone]

## 2017-02-21 ENCOUNTER — Telehealth: Payer: Self-pay | Admitting: Gastroenterology

## 2017-02-21 DIAGNOSIS — K529 Noninfective gastroenteritis and colitis, unspecified: Secondary | ICD-10-CM

## 2017-02-21 NOTE — Telephone Encounter (Signed)
Contacted by patient's PCP. Following inbox message reply sent. Will place order for stool studies.

## 2017-02-22 ENCOUNTER — Ambulatory Visit: Payer: Medicaid Other | Attending: Pediatric Hematology and Oncology | Admitting: Pediatric Hematology and Oncology

## 2017-02-22 ENCOUNTER — Ambulatory Visit: Payer: Medicaid Other | Admitting: Ophthalmology

## 2017-02-22 ENCOUNTER — Other Ambulatory Visit: Payer: Self-pay | Admitting: Primary Care

## 2017-02-22 ENCOUNTER — Encounter: Payer: Self-pay | Admitting: Pediatric Hematology and Oncology

## 2017-02-22 VITALS — BP 123/77 | HR 92 | Temp 98.1°F | Resp 20 | Ht 74.0 in | Wt 178.8 lb

## 2017-02-22 DIAGNOSIS — D571 Sickle-cell disease without crisis: Secondary | ICD-10-CM | POA: Insufficient documentation

## 2017-02-22 DIAGNOSIS — K5190 Ulcerative colitis, unspecified, without complications: Secondary | ICD-10-CM

## 2017-02-22 DIAGNOSIS — D564 Hereditary persistence of fetal hemoglobin [HPFH]: Secondary | ICD-10-CM

## 2017-02-22 DIAGNOSIS — K529 Noninfective gastroenteritis and colitis, unspecified: Secondary | ICD-10-CM

## 2017-02-22 DIAGNOSIS — D509 Iron deficiency anemia, unspecified: Secondary | ICD-10-CM | POA: Insufficient documentation

## 2017-02-22 MED ORDER — FERROUS SULFATE 325 (65 FE) MG PO TABS *WRAPPED* *I*
325.0000 mg | ORAL_TABLET | Freq: Two times a day (BID) | ORAL | 3 refills | Status: DC
Start: 2017-02-22 — End: 2017-04-30

## 2017-02-22 MED ORDER — OXYCODONE HCL 10 MG PO TABS *I*
10.0000 mg | ORAL_TABLET | ORAL | 0 refills | Status: DC | PRN
Start: 2017-02-22 — End: 2017-03-29

## 2017-02-22 NOTE — Telephone Encounter (Signed)
Search Terms: Cameron [name] Fiebig, 18-May-1996   Search Date: 02/22/2017 12:04:41 PM   Searching on behalf of: [id number] - Tiffany L [name]   The Drug Utilization Report below displays all of the controlled substance prescriptions, if any, that your patient has filled in the last twelve months. The information displayed on this report is compiled from pharmacy submissions to the Department, and accurately reflects the information as submitted by the pharmacies.  This report was requested by: Denton MeekKristie Jolly Carlini   Reference #: [id number]   Others' Prescriptions  Patient Name: Cameron [name] Birth Date: 18-May-1996   Address: [address] [place], [place] [id number] Sex: Male   Rx Written Rx Dispensed Drug Quantity Days Supply Prescriber Name Payment Method Dispenser   01/23/2017 01/23/2017 oxycodone hcl 10 mg tablet  40 7 [name], Gwenith Spitziffany L MD [facility]   01/01/2017 01/02/2017 oxycodone hcl 10 mg tablet  14 14 [name], Gwenith Spitziffany L MD Insurance [facility]   12/04/2016 12/05/2016 oxycodone hcl 10 mg tablet  30 15 Diannia RuderNoronha, Suzie A (MD) [facility]   11/05/2016 11/06/2016 oxycodone hcl 10 mg tablet  30 15 Diannia RuderNoronha, Suzie A (MD) [facility]   10/02/2016 10/02/2016 oxycodone hcl 10 mg tablet  30 15 Diannia RuderNoronha, Suzie A (MD) [facility]   09/07/2016 09/09/2016 oxycodone hcl 10 mg tablet  30 15 Diannia RuderNoronha, Suzie A (MD) [facility]   08/03/2016 08/03/2016 oxycodone hcl 10 mg tablet  30 15 Diannia RuderNoronha, Suzie A (MD) Insurance [facility]   07/11/2016 07/12/2016 oxycodone hcl 10 mg tablet  30 15 Diannia RuderNoronha, Suzie A (MD) [facility]   06/08/2016 06/08/2016 oxycodone hcl 10 mg tablet  30 15 Diannia RuderNoronha, Suzie A (MD) [facility]   05/09/2016 05/10/2016 oxycodone hcl 10 mg tablet  30 15 Diannia RuderNoronha, Suzie A (MD) [facility]   04/11/2016 04/11/2016  oxycodone hcl 10 mg tablet  30 15 Diannia RuderNoronha, Suzie A (MD) Insurance [facility]   03/14/2016 03/14/2016 oxycodone hcl 10 mg tablet  30 15 Godfrey PickVargas, [name] M Insurance [facility]

## 2017-02-22 NOTE — Progress Notes (Signed)
This encounter was created in error - please disregard.

## 2017-02-22 NOTE — Progress Notes (Addendum)
Pediatric Hematology/Oncology Clinic Visit     CC: 21 y.o. man with HbS-high F Sickle Cell Disease and newly diagnosed ulcerative colitis here for follow up of disease    Inteveral history:  He was admitted to the internal medicine service in setting of a month long hx of bloody diarrhea and a hemoglobin of 5.6. He was transfused 3x and also had an upper and lower endoscopy which showed no source of bleed. Biopsies confirmed chronic colitis concerning for ulcerative colitis. He was started on folate as his levels were noted to be low. After discharge, adult HO recommended checking a CBC and retic count twice a week until improving. [name] says that he has had weight loss (since the 30th, ~4 pounds) and fatigue. Doesn't feel like he is about to pass out or have shortness of breath. He eats more fruits and veggies but does not eat as much meats.     Was seen in the HO clinic on the 30th for follow up after discharged and was noted to have intermittent belly pain at that point, but none since his visit. His stool has still been watery having 2-3 epsiodes of non bloody diarrhea, notices that it occasionally smells bad. No longer having bloody stools. Denies recent ibuprofen use.     Per chart review, yesterday GI wanted to have bacterial PCRs done on his stool which has not been done yet. His follow up with GI will be in July.     Has not seen an ophthalmologist this year, but has an upcoming appointment.   Most recent dental visit: 2 mo ago.    No recent, congestion, cough, fevers, runny nose, pain crisis, neuropathies, or weakness.     HPI: Cameron [name] is a 21 y.o. male with SCD (HbS-high F disease) and recently diagnosed with ulcerative colitis. He is s/p cholecystectomy in 2014.     ROS:  Const- no fevers. Positive for fatigue.   HEENT- no URI symptoms.   CV- no chest pain  Pulm- no cough but no SOB   GI- No nausea, vomiting. Positive for diarrhea. No abdominal pain. No abd pain at this time.   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  Currently working at [employer]. Family history notable for brothers and sister with sickle cell disease.     Medication Sig Start Date   folic acid (FOLVITE) 1 MG tablet Take 1 tablet (1 mg total) by mouth daily 09/07/16   oxyCODONE (ROXICODONE) 10 MG immediate release tablet Take 1 tablet (10 mg total) by mouth every 12 hours as needed for Pain   Max daily dose: 20 mg 09/07/16   ibuprofen (ADVIL,MOTRIN) 600 MG tablet Take 1 tablet (600 mg total) by mouth 4 times daily as needed for Pain 05/10/16   clindamycin-benzoyl peroxide (BENZACLIN) gel Apply topically 2 times daily 06/09/15       Objective:  Vitals:   BP 123/77   Pulse 92   Temp 36.7 C (98.1 F) (Temporal)    Resp 20   Ht 188 cm ('6\' 2"'$ )   Wt 81.1  kg (178 lb 12.7 oz)   SpO2 99%   BMI 22.96 kg/m2    JKK:XFGH mass index is 22.96 kg/(m^2).  BMI Percentile:51 %ile (Z= 0.03) based on CDC 2-20 Years BMI-for-age data using vitals from 02/22/2017.  Weight Percentile: 80 %ile (Z= 0.84) based on CDC 2-20 Years weight-for-age data using vitals from 02/22/2017.  GENERAL: well appearing in no acute distress  HEENT: Normocephalic, atraumatic, extraocular movements intact, anicteric sclera, mucous membranes moist, no oral lesions  Neck: supple  CV: S1, S2 regular rate and rhythm without murmur  Resp: clear to auscultation bilaterally without wheezes, rales, rhonchi.  GI: Soft, non-tender currently upon palpation, nondistended, without organomegaly or masses.   Skin: no rash  Extremities warm, well perfused  MSK: normal bulk and tone    Laboratory Results:       Lab results: 02/20/17  1914   WBC 8.4   Hemoglobin 8.1*    Hematocrit 28*   RBC 4.2*   Platelets 367*   Seg Neut % 70.7   Lymphocyte % 17.6   Monocyte % 8.9   Eosinophil % 2.3   Basophil % 0.4     Lab Results   Component Value Date    RETAP 0.5 (L) 02/20/2017         Lab results: 02/11/17  0400  02/07/17  2014   Sodium 137  < >  --    Potassium 3.8  < >  --    Chloride 100  < >  --    CO2 22  < >  --    UN 6  < >  --    Creatinine 0.91  < > 0.87   GFR,Caucasian 121  < >  --    GFR,Black 140  < >  --    Glucose 73  < >  --    Calcium 9.4  < >  --    Total Protein  --   --  8.3*   Albumin  --   --  3.7   ALT  --   --  21   AST  --   --  13   Alk Phos  --   --  91   Bilirubin,Total  --   --  0.7   < > = values in this interval not displayed.     Assessment/Plan: 21 y.o. boy with HbSS with high F with newly diagnosed ulcerative colitis with most recent labs concerning for microcytic anemia in setting of recent hospitalization for hematochezia. His microcytic anemia and reticulopenia is likely due to iron deficiency. Slight fatigue though not overtly symptomatic with anemia.     1. Intermittent VOC though no recent episodes: Continue oxycodone 10 mg q 4 hr PRN for pain.   2. Transition: He has been seen by adult hematology as well as the complex care center. Will continue to see patient per request. Discussed with Xzayvion regarding need for GI PCR labs and gave him a hat, sample cup. Attempting to stool in clinic. Samples obtained and sent to lab.   3. Sickle cell: Continue 1 mg folic acid daily. Reviewed ways to increase iron intake via diet. Given handout from [employer] regarding iron rich foods.   4. Microcytic anemia: Lab shows microcytic anemia with hemoglobin down from baseline noted around 10 and reticulopenia.Start iron supplements-- 325 mg (65 FE) BID with meals.      Follow up in a month in clinic.     Carin Hock,  MD  Pediatrics, PGY-3  [phone]  02/22/17 at 10:30 AM     Attending addendum:  I saw and examined the patient.  I agree with the findings as documented  above.  I discussed the proposed plan with the resident and the patient.   My additions are as follows: Osby is a 21yo man with HbSS, high F who was recently diagnosed with ulcerative colitis. Still having watery stools with less blood. In the hospital, he was noted to have microcytic anemia with reticulocytopenia. Received PRBC transfusion. Inpatient heme/onc consultation found folate deficiency but recommended follow up of CBC, retic. His exam today is reassuring. Stool remains watery and blood tinged - sent for microbio studies. His history of GI bleeding and microcytic anemia with reticulocytopenia seems much more consistent with iron deficiency anemia than anything else. Normal serum iron does not rule out iron deficiency since that is merely a snapshot in time (iron content of last meal). Low ferritin is a better marker of iron deficiency, but I would expect it to be high in the setting of inflammatory bowel disease, so it would not be helpful in his case.     I recommended starting on ferrous sulfate 381m BID, iron-rich foods. The problem is if he is having ongoing GI losses, we may have more difficulty correcting his iron deficiency. Hopefully, his GI meds will help. I will continue to see him until this hematologic problem stabilizes, since I know him and his medical history well. Once it is corrected, future hematologic problems can be addressed by adult hematology. RTC one month and will repeat labs then.     SOrlena Sheldon MD

## 2017-02-23 LAB — ENTAMOEBA HISTOLYTICA PCR: E. histolytica PCR: 0

## 2017-02-23 LAB — GIARDIA PCR: Giardia PCR: 0

## 2017-02-23 LAB — SHIGELLA PCR: Shigella PCR: 0

## 2017-02-23 LAB — SALMONELLA PCR: Salmonella PCR: 0

## 2017-02-23 LAB — CRYPTOSPORIDIUM PCR: Cryptosporidium PCR: 0

## 2017-02-23 LAB — SHIGA TOXIN PCR: Shiga toxin PCR: 0

## 2017-02-23 LAB — CAMPYLOBACTER PCR: Campylobacter PCR: 0

## 2017-02-25 NOTE — Telephone Encounter (Signed)
Search Terms: Cameron BachelorLucius Coote, 06/02/1996   Search Date: 02/25/2017 08:01:07 AM   Searching on behalf of: [id number] - Tiffany L [name]   The Drug Utilization Report below displays all of the controlled substance prescriptions, if any, that your patient has filled in the last twelve months. The information displayed on this report is compiled from pharmacy submissions to the Department, and accurately reflects the information as submitted by the pharmacies.  This report was requested by: Denton MeekKristie Camillia Marcy   Reference #: [id number]   Others' Prescriptions  Patient Name: Cameron [name] Birth Date: 06/02/1996   Address[place] [address] [place], [place] [id number] Sex: Male   Rx Written Rx Dispensed Drug Quantity Days Supply Prescriber Name Payment Method Dispenser   02/22/2017 02/23/2017 oxycodone hcl 10 mg tablet  40 7 [name], Gwenith Spitziffany L MD [facility]   01/23/2017 01/23/2017 oxycodone hcl 10 mg tablet  40 7 [name], Gwenith Spitziffany L MD [facility]   01/01/2017 01/02/2017 oxycodone hcl 10 mg tablet  14 14 [name], [name] [facility]   12/04/2016 12/05/2016 oxycodone hcl 10 mg tablet  30 15 Diannia RuderNoronha, Suzie A (MD) [facility]   11/05/2016 11/06/2016 oxycodone hcl 10 mg tablet  30 15 Diannia RuderNoronha, Suzie A (MD) [facility]   10/02/2016 10/02/2016 oxycodone hcl 10 mg tablet  30 15 Diannia RuderNoronha, Suzie A (MD) [facility]   09/07/2016 09/09/2016 oxycodone hcl 10 mg tablet  30 15 Diannia RuderNoronha, Suzie A (MD) [facility]   08/03/2016 08/03/2016 oxycodone hcl 10 mg tablet  30 15 Diannia RuderNoronha, Suzie A (MD) [facility]   07/11/2016 07/12/2016 oxycodone hcl 10 mg tablet  30 15 Diannia RuderNoronha, Suzie A (MD) [facility]   06/08/2016 06/08/2016 oxycodone hcl 10 mg tablet  30 15 Diannia RuderNoronha, Suzie A (MD) [facility]   05/09/2016  05/10/2016 oxycodone hcl 10 mg tablet  30 15 Diannia RuderNoronha, Suzie A (MD) [facility]   04/11/2016 04/11/2016 oxycodone hcl 10 mg tablet  30 15 Diannia RuderNoronha, Suzie A (MD) [facility]   03/14/2016 03/14/2016 oxycodone hcl 10 mg tablet  30 15 Godfrey PickVargas, [name] M [facility]

## 2017-02-26 ENCOUNTER — Ambulatory Visit: Payer: Medicaid Other | Admitting: Primary Care

## 2017-02-26 NOTE — Telephone Encounter (Signed)
Patient seen by Dr. Concha NorwayPulcino on Wed 02/20/17.  Patient seen by Dr. Valla LeaverNoronha at [facility] on Friday 02/22/17.

## 2017-03-01 ENCOUNTER — Telehealth: Payer: Self-pay | Admitting: Primary Care

## 2017-03-01 ENCOUNTER — Telehealth: Payer: Self-pay

## 2017-03-01 NOTE — Telephone Encounter (Signed)
A release of information was sent to the front without where it is supposed to go.     Called the pt to see where the release should be sent and the pt states that he was unaware of where the release needed to be sent.    Release in your FYI box.  Suggestions?

## 2017-03-01 NOTE — Telephone Encounter (Signed)
Pt had episode of emesis x' 1 this morning after taking ferrous sulfate on empty stomach. Advised per instructions provided with the bottle of medication, pt should take this medication with meals twice daily. Mom expressed understanding, will follow up with [facility] if this issue continues. No other medical concerns today.      Message sent to provider via this telephone encounter.

## 2017-03-01 NOTE — Telephone Encounter (Signed)
Yes this is a release for me to have his pathology slides form his colonoscopy sent to [facility] for a second opinion and assessment before starting medicaitons - can you obtain for me contact information fro [facility] pathology (GI) so I can call and find out how to facilitate thsi? Thank you

## 2017-03-04 NOTE — Telephone Encounter (Signed)
[facility]  [address] [place], [place] [address]   Phone:  [phone]    [phone]    Fax:  [phone]     Do you want this written on the release for you and then to medical records?

## 2017-03-07 NOTE — Telephone Encounter (Signed)
Added the required information and the release was faxed to medical records

## 2017-03-25 NOTE — Progress Notes (Signed)
[facility]  [address]  [place] [address]  Phone: [phone]  Fax: [phone]    REASON FOR VISIT     Sickle Cell VOC    PRIMARY DIAGNOSIS   Sickle Cell disease, type  HgSS    SUBJECTIVE     Onset of change in pain: intermittant with training  Location of pain: back   Inciting events: location of intermittant pain thinks at time during training or hard day of work  Interventions at home: has been taking oxy IR only prn since last visit after tapering down dosing from standing BID dosing     Current home management:  Non pharmacologic interventions: rest, heat, fluids   Non opioid pain modifying medications: ibuprofent 600 mg  Q 6 hr  Short acting Opioid pain medications: oxy IR 1o mg Q 4 hr prn  Long acting Opioid pain medications: none      Current Pain score:   Pain    01/23/17 1350   PainSc:   0 - No pain     Emergency Room visits for this event: 0  Hospitalizations for this event: 0    Current disease modifying therapy: none - not indicated    Medications Reviewed and changes     Current Outpatient Prescriptions:     clindamycin-benzoyl peroxide (BENZACLIN) gel, Apply topically 2 times daily, Disp: 25 g, Rfl: 5    Electronic Thermometer (DIGITAL THERMOMETER) MISC, Use as directed, Disp: 1 each, Rfl: 0    ferrous sulfate 325 (65 FE) MG tablet, Take 1 tablet (325 mg total) by mouth 2 times daily (with meals), Disp: 100 tablet, Rfl: 3    oxyCODONE (ROXICODONE) 10 MG immediate release tablet, Take 1 tablet (10 mg total) by mouth every 4 hours as needed for Pain   Max daily dose: 60 mg, Disp: 40 tablet, Rfl: 0    mesalamine (ASACOL HD) 800 MG tablet, Take 3 tablets (2,400 mg total) by mouth 2 times daily, Disp: 180 tablet, Rfl: 2    folic acid (FOLVITE) 1 MG tablet, Take 1 tablet (1 mg total) by mouth daily, Disp: 30 tablet, Rfl: 2    naloxone (NARCAN) 4 mg/0.1 mL nasal spray, Instill 1 spray in 1 nostril once for opioid reversal. Repeat in alternating nostrils with new  package every 2-3 minutes until response, Disp: 2 each, Rfl: 5  Review of Systems   Constitutional: Negative for activity change, appetite change, fatigue and fever.   Respiratory: Negative for cough, chest tightness and shortness of breath.    Gastrointestinal: Negative for constipation, nausea and vomiting.   Musculoskeletal: Positive for back pain.   Neurological: Negative for dizziness.   Psychiatric/Behavioral: Negative for decreased concentration, dysphoric mood and sleep disturbance.     OBJECTIVE     BP 132/66   Pulse (!) 120   Ht 1.842 m (6' 0.52")   Wt 86.3 kg (190 lb 4.8 oz)   SpO2 93%   BMI 25.44 kg/m2  Physical Exam   Constitutional: He appears well-developed and well-nourished. No distress.   Musculoskeletal:   Full flexion and extension of back without discomfort, no tenderness to paplation along midline   Psychiatric: He has a normal mood and affect. His behavior is normal. Judgment and thought content normal.       ASSESSMENT / DIAGNOSIS     Timm was seen today for sickle cell disease follow up Hg SS with high Hg F and rare VOC.  Spent majority of visit discussing  home bassed management of VOC rlated pain.  Based on Tan' description of back pain and overall fitness level I suspect this is VOC related pain.  .    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  PHysical  paperwork completed for school.    Other orders  -     Discontinue: oxyCODONE (ROXICODONE) 10 MG immediate release tablet; Take 1 tablet (10 mg total) by mouth every 4 hours as needed for Pain   Max daily dose: 60 mg  -     naloxone (NARCAN) 4 mg/0.1 mL nasal spray; Instill 1 spray in 1 nostril once for opioid reversal. Repeat in alternating nostrils with new package every 2-3 minutes until response      Patient Instructions    [place] a [place] when you go back in July     Make contact with the office for people with disabilities at [facility] to let them know about your sickle cell disease and pick up forms for me to fill out     Bring a physical form for [facility] to have Dr. Concha NorwayPulcino fill out at your next appointment     Take you medication with you out of the house in a perscription bottle     Buy a lock box for your [facility] at [employer] [facility]iction     Take your Oxy IR only if you need it for pain     Your Eye appointment is May 25 at 8:30 am [address] (stand alone white building)        Return in about 2 months (around 03/25/2017) for SCD f/u.  --Patient instructed to call if symptoms are not improving or worsening      Electronically signed by Benay PillowIFFANY L Arielys Wandersee, MD 01/23/2017   7:21 PM  [facility], Phone: [phone]

## 2017-03-25 NOTE — Progress Notes (Signed)
[facility]  [address]  [place] [address]  Phone: [phone]  Fax: [phone]      REASON FOR VISIT   Hospital follow up    SUBJECTIVE   Here for hospital follow up from acute abdominal pain and LGI bleed  Sickle cell disease type HgSS     Admitted to 5/17 from 5/21 at [facility] course:   Per Cameron [name] he went to the hospital emergency room after sudden onset of crmaping abdominal pain at work at stooled blood   Has been having abdominal pain for about a month lower diffuse and had not been able to relate to foods or activities  Still having a lot boody stool and received 3 U pRBC with nadire of Hgb at 5.6 day following admission  GI consulted during hospitalization after CT abd showed no evidence of IBD including no bowel wall thickening only several midly prominent LN that did not meet size criteria   Colonoscopy showed normal ileal mucosa and mild edema throughout colon with punctate areas of white exudate but no ulcerations along with small internal hemorhoids  Pathology showed only chronic colitis, mildly active, nonspecific      Using pain medication about once a day for leg and back pain    Pain protocol followed: N/A  Received transfusion: yes 3 U PRBC    Medication changes at discharge: none  Prescriptions provided at discharge: oxy IR 10 mg prn   Discharged with services: Not Applicable      Interval history: Still having intermittant abdominal pain and increased stool output but not bloody and reports he received a call from GI fellow recommending start of asacol 4.8 g/day a few days ago but has not started pending visit today    Non pharmacologic interventions: rest, heat, fluids   Non opioid pain modifying medications: ibuprrofen 600 mg  Q 6 hr  Short acting Opioid pain medications: oxy IR 10 mg Q 4 hr  Long acting Opioid pain medications: none    Current Pain score:   Pain    02/20/17 1119   PainSc:   0 - No pain       Current disease modifying therapy:  none, high HgF limited VOC    Medications Reviewed and changes     Current Outpatient Prescriptions:     mesalamine (ASACOL HD) 800 MG tablet, Take 3 tablets (2,400 mg total) by mouth 2 times daily, Disp: 180 tablet, Rfl: 2    folic acid (FOLVITE) 1 MG tablet, Take 1 tablet (1 mg total) by mouth daily, Disp: 30 tablet, Rfl: 2    naloxone (NARCAN) 4 mg/0.1 mL nasal spray, Instill 1 spray in 1 nostril once for opioid reversal. Repeat in alternating nostrils with new package every 2-3 minutes until response, Disp: 2 each, Rfl: 5    clindamycin-benzoyl peroxide (BENZACLIN) gel, Apply topically 2 times daily, Disp: 25 g, Rfl: 5    Electronic Thermometer (DIGITAL THERMOMETER) MISC, Use as directed, Disp: 1 each, Rfl: 0    ferrous sulfate 325 (65 FE) MG tablet, Take 1 tablet (325 mg total) by mouth 2 times daily (with meals), Disp: 100 tablet, Rfl: 3    oxyCODONE (ROXICODONE) 10 MG immediate release tablet, Take 1 tablet (10 mg total) by mouth every 4 hours as needed for Pain   Max daily dose: 60 mg, Disp: 40 tablet, Rfl: 0  Review of Systems          Medications  reviewed at today's visit    OBJECTIVE   PHYSICAL EXAM    Vitals:    02/20/17 1119   BP: 124/66   Pulse: (!) 117   Weight: 82.7 kg (182 lb 6.4 oz)   Height: 1.854 m (6' 0.99")         Physical Exam   Constitutional: He is well-developed, well-nourished, and in no distress.   HENT:   Mouth/Throat: Oropharynx is clear and moist. No oropharyngeal exudate.   No oral lesions     Abdominal: Soft. Bowel sounds are normal. He exhibits no distension and no mass. There is no tenderness. There is no rebound and no guarding.   Skin: Skin is warm and dry. No rash noted.           Lab results: 02/20/17  1914   WBC 8.4   Hemoglobin 8.1*   Hematocrit 28*   RBC 4.2*   Platelets 367*       ASSESSMENT/Plan     Cameron [name] was seen today for follow up of hospitalization for Acute LGI bleed.  We discussed the presumed dx of Ulcerative colitis given colonoscopy, path results and  isolated event. Cameron [name] understandably is concerned about a new chronic condition dx and I am also concerned that there is no definative findings consistent with pathology of UC given the impact of this diagnosis on this young man about to leave for college.  .  The gross colonoscopy findings were consistent with loss of vascular markings and exudates but path did not show crypt abscesses, crypt branching, shortening and disarray, and crypt atrophy.   Will contact GI to discuss and received consent from Cameron that should it be required path can be [facility] for second opinion of dx  Will also send inflammatory marker as negative results off antiinflammatories would be fiarly specific given recent flare  Infectious work up does not appear to have been completed will also ask GI about this as this may have been a self limited infectious colitis - unclear what in path made it considered "chronic"     Patient referred to f/u with Adult Heme after hospitalization but was able to reschedule with peds heme and is aware of appt - will continue to f/u with peds heme pending trnaisition to college    Diagnoses and all orders for this visit:    Sickle cell disease  -     CBC and differential; Future  -     Reticulocytes; Future  -     C reactive protein; Future  -     Sedimentation rate, automated; Future  -     CBC and differential  -     Reticulocytes  -     C reactive protein  -     Sedimentation rate, automated    Colitis  -     CBC and differential; Future  -     Reticulocytes; Future  -     C reactive protein; Future  -     Sedimentation rate, automated; Future  -     CBC and differential  -     Reticulocytes  -     C reactive protein  -     Sedimentation rate, automated    Other orders  -     RBC morphology    Return in about 4 weeks (around 03/20/2017) for SCD f/u.    Electronically signed by Benay PillowIFFANY L Kenidy Crossland, MD 02/20/2017 9:31 AM  [facility], Phone: [phone]

## 2017-03-29 ENCOUNTER — Ambulatory Visit: Payer: Medicaid Other | Attending: Primary Care | Admitting: Primary Care

## 2017-03-29 ENCOUNTER — Ambulatory Visit: Payer: Medicaid Other | Admitting: Pediatric Hematology and Oncology

## 2017-03-29 ENCOUNTER — Encounter: Payer: Self-pay | Admitting: Primary Care

## 2017-03-29 VITALS — BP 122/60 | HR 102 | Ht 74.02 in | Wt 178.0 lb

## 2017-03-29 DIAGNOSIS — D564 Hereditary persistence of fetal hemoglobin [HPFH]: Secondary | ICD-10-CM

## 2017-03-29 DIAGNOSIS — K5190 Ulcerative colitis, unspecified, without complications: Secondary | ICD-10-CM

## 2017-03-29 DIAGNOSIS — D571 Sickle-cell disease without crisis: Secondary | ICD-10-CM

## 2017-03-29 MED ORDER — OXYCODONE HCL 10 MG PO TABS *I*
10.0000 mg | ORAL_TABLET | ORAL | 0 refills | Status: DC | PRN
Start: 2017-03-29 — End: 2017-04-30

## 2017-04-18 ENCOUNTER — Ambulatory Visit: Payer: Medicaid Other | Admitting: Internal Medicine

## 2017-04-25 NOTE — Progress Notes (Signed)
[facility]  [address]  [place] [id number]  Phone: [phone]  Fax: [phone]    REASON FOR VISIT   F/U dx of UC disease and sickle cell HCM    PRIMARY DIAGNOSIS   Sickle Cell disease, type HgSS    SUBJECTIVE   Current concerns:  Addie reports he has been doing well with no GI upset or abdominal pain since his hospitalization  Has started asacol since being notified that his stool testing for infection were negative  Tolerating medication well without side effects and has been having no trouble remembering to take it  Has had no diarrhea, cramping, decreased appetite    Elvyn lives with his mother, sister and brother in single family home. When feeling sick, he relies on his  Mother for support. Eivin accesses his pharmacy by transport. His main income comes from full time employment (started working at [employer].  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    SIckle Cell disease:  Rare issues with back and leg pain, no longer taking medication ATC for pain  Has been predominantly been able to manage with ibuprofen  Trying to drink lots of water    Current home management:  Non pharmacologic interventions: rest, heat, fluids   Non opioid pain modifying medications: ibuprofent 600 mg  Q 6 hr  Short acting Opioid pain medications: oxy IR 1o mg Q 4 hr prn  Long acting Opioid pain medications: none    Current Pain score:   Pain    03/29/17 1113   PainSc:   0 - No pain     Emergency Room visits since last visit: 0  Hospitalizations since last visit: 0    Current disease modifying therapy:not indicated - high native HgF    Medications Reviewed and changes     Current Outpatient Prescriptions:     oxyCODONE (ROXICODONE) 10 MG immediate release tablet, Take 1 tablet (10 mg total) by mouth every 4 hours as needed for Pain   Max daily dose: 60 mg, Disp: 40 tablet, Rfl: 0    ferrous sulfate 325 (65 FE) MG tablet, Take 1 tablet (325 mg total) by  mouth 2 times daily (with meals), Disp: 100 tablet, Rfl: 3    mesalamine (ASACOL HD) 800 MG tablet, Take 3 tablets (2,400 mg total) by mouth 2 times daily, Disp: 180 tablet, Rfl: 2    folic acid (FOLVITE) 1 MG tablet, Take 1 tablet (1 mg total) by mouth daily, Disp: 30 tablet, Rfl: 2    naloxone (NARCAN) 4 mg/0.1 mL nasal spray, Instill 1 spray in 1 nostril once for opioid reversal. Repeat in alternating nostrils with new package every 2-3 minutes until response, Disp: 2 each, Rfl: 5    clindamycin-benzoyl peroxide (BENZACLIN) gel, Apply topically 2 times daily, Disp: 25 g, Rfl: 5    Electronic Thermometer (DIGITAL THERMOMETER) MISC, Use as directed, Disp: 1 each, Rfl: 0  Review of Systems   Constitutional: Negative for activity change, appetite change, fatigue and fever.   HENT: Negative for mouth sores.    Respiratory: Negative for cough, chest tightness and shortness of breath.    Gastrointestinal: Negative for abdominal distention, abdominal pain, blood in stool, constipation, diarrhea, nausea and vomiting.   Neurological: Negative for dizziness.   Psychiatric/Behavioral: Negative for sleep disturbance.     OBJECTIVE     BP 122/60   Pulse 102   Ht 1.88 m (6' 2.02")  Wt 80.7 kg (178 lb)   SpO2 100%   BMI 22.84 kg/m2  Physical Exam   Constitutional: He appears well-developed and well-nourished. No distress.   Psychiatric: He has a normal mood and affect. His behavior is normal. Judgment and thought content normal.     ASSESSMENT / DIAGNOSIS   Lanson was seen today for follow up of new diagnosis of UC based on admission for acute LGI bleed and diffuse colitis on imaging and pathology and f/u of sicke cell disease as he prepares for transition to college.   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  Will need labs prior to leaving for school to identify if plans to continue - plan to f/u with Dr. Valla LeaverNoronha with transition to adult hematology later as need arises.  May benefit from assessment of stool guaic testing prior to leaving for school to identify if there are ongoing GI blood losses to support Dr. Aline AugustNoronha's decision regarding continuation of iron therapy    Continue oxy IR 10 mg prn for VOC pain - doing well off routine dosing  Discussed ways to manage start of training for football and heat of August  revieweing S/S of dehydration and resources if required will be identified in [place] by [facility] nursing    Ulcerative colitis  Started on asacol  Awaiting feedback from pathology of [facility] on dx of UC as no ulcerations noted in colonoscopy   Appt for GI f/u scheduled prior to August start of college    Plan for f/u with [facility] afer specialty visits prior to starting school this fall to ensure able to collate complete health summary for Zorion and send to college health center and ensure my chart access established for him.    Other orders  -     oxyCODONE (ROXICODONE) 10 MG immediate release tablet; Take 1 tablet (10 mg total) by mouth every 4 hours as needed for Pain   Max daily dose: 60 mg    Immunizations:   Sickle Cell Immunizations     Name Date    MENINGOCOCCAL (Bexsero) Oct 26, 16    MENINGOCOCCAL (Bexsero) Sep 15, 16    MENINGOCOCCAL [facility](MENACTRA) Aug 19, 11 12:00 AM    MENINGOCOCCAL [facility](MENACTRA) Aug 12, 10 12:00 AM    MENINGOCOCCAL CONJUGATE MCV4P Jan 29, 16    MENINGOCOCCAL Polysaccharide Jul 25, 01 12:00 AM    PNEUMOCOCCAL 13(PREVNAR 13) Nov 23, 15    PNEUMOCOCCAL(PNEUMOVAX) Sep 15, 16    PNEUMOCOCCAL(PNEUMOVAX) Nov 28, 01            Mental Health:  Current BH screening:  Recent Review Flowsheet Data     PHQ-2/9 Scores 01/01/2017    PHQ9 Calculated Score 0          Pain management plan of care:  Benefit outweighs Risk of opioid pain medications: yes    Opportunity to wean opioid: no  Agreed upon Treatment goals include:  Prevention of hospitalization     Urine Toxicology most recently collected on: 9/16  Urine toxicology results: appropriate    No Patient Care Coordination Note on file.          Return in about 1 month (around 04/29/2017) for SCD f/u.  Electronically signed by Benay Pillow, MD 03/29/2017 7:44 PM  [facility], Phone: [phone]

## 2017-04-30 ENCOUNTER — Encounter: Payer: Self-pay | Admitting: Social Worker

## 2017-04-30 ENCOUNTER — Encounter: Payer: Self-pay | Admitting: Primary Care

## 2017-04-30 ENCOUNTER — Ambulatory Visit: Payer: Medicaid Other | Attending: Primary Care | Admitting: Primary Care

## 2017-04-30 VITALS — BP 118/58 | HR 108 | Ht 74.02 in | Wt 180.2 lb

## 2017-04-30 DIAGNOSIS — E611 Iron deficiency: Secondary | ICD-10-CM | POA: Insufficient documentation

## 2017-04-30 DIAGNOSIS — K5190 Ulcerative colitis, unspecified, without complications: Secondary | ICD-10-CM

## 2017-04-30 LAB — COMPREHENSIVE METABOLIC PANEL
ALT: 23 U/L (ref 0–50)
AST: 17 U/L (ref 0–50)
Albumin: 3.8 g/dL (ref 3.5–5.2)
Alk Phos: 89 U/L (ref 65–260)
Anion Gap: 10 (ref 7–16)
Bilirubin,Total: 0.5 mg/dL (ref 0.0–1.2)
CO2: 26 mmol/L (ref 20–28)
Calcium: 8.7 mg/dL — ABNORMAL LOW (ref 9.3–10.5)
Chloride: 102 mmol/L (ref 96–108)
Creatinine: 0.7 mg/dL (ref 0.50–1.00)
GFR,Black: 157 *
GFR,Caucasian: 136 *
Glucose: 67 mg/dL (ref 60–99)
Lab: 6 mg/dL (ref 6–20)
Potassium: 4.1 mmol/L (ref 3.3–5.1)
Sodium: 138 mmol/L (ref 133–145)
Total Protein: 8.1 g/dL — ABNORMAL HIGH (ref 6.3–7.7)

## 2017-04-30 LAB — CBC AND DIFFERENTIAL
Baso # K/uL: 0 10*3/uL (ref 0.0–0.1)
Basophil %: 0.3 %
Eos # K/uL: 0.2 10*3/uL (ref 0.0–0.5)
Eosinophil %: 3.3 %
Hematocrit: 20 % — ABNORMAL LOW (ref 40–51)
Hemoglobin: 5.6 g/dL — ABNORMAL LOW (ref 13.7–17.5)
IMM Granulocytes #: 0 10*3/uL (ref 0.0–0.1)
IMM Granulocytes: 0.2 %
Lymph # K/uL: 1.4 10*3/uL (ref 1.3–3.6)
Lymphocyte %: 22 %
MCH: 18 pg/cell — ABNORMAL LOW (ref 26–32)
MCHC: 28 g/dL — ABNORMAL LOW (ref 32–37)
MCV: 64 fL — ABNORMAL LOW (ref 79–92)
Mono # K/uL: 0.6 10*3/uL (ref 0.3–0.8)
Monocyte %: 9.8 %
Neut # K/uL: 4.2 10*3/uL (ref 1.8–5.4)
Nucl RBC # K/uL: 0 10*3/uL (ref 0.0–0.0)
Nucl RBC %: 0 /100 WBC (ref 0.0–0.2)
Platelets: 352 10*3/uL — ABNORMAL HIGH (ref 150–330)
RBC: 3.1 MIL/uL — ABNORMAL LOW (ref 4.6–6.1)
RDW: 19.5 % — ABNORMAL HIGH (ref 11.6–14.4)
Seg Neut %: 64.4 %
WBC: 6.5 10*3/uL (ref 4.2–9.1)

## 2017-04-30 LAB — FERRITIN: Ferritin: 5 ng/mL — ABNORMAL LOW (ref 20–250)

## 2017-04-30 LAB — TIBC
Iron: 14 ug/dL — ABNORMAL LOW (ref 45–170)
TIBC: 350 ug/dL (ref 250–450)
Transferrin Saturation: 4 % — ABNORMAL LOW (ref 20–55)

## 2017-04-30 LAB — SEDIMENTATION RATE, AUTOMATED: Sedimentation Rate: 85 mm/hr — ABNORMAL HIGH (ref 0–15)

## 2017-04-30 MED ORDER — OXYCODONE HCL 10 MG PO TABS *I*
10.0000 mg | ORAL_TABLET | ORAL | 0 refills | Status: DC | PRN
Start: 2017-04-30 — End: 2017-06-04

## 2017-04-30 MED ORDER — CLINDAMYCIN PHOS-BENZOYL PEROX 1-5 % EX GEL *A*
Freq: Two times a day (BID) | CUTANEOUS | 5 refills | Status: DC
Start: 2017-04-30 — End: 2017-07-02

## 2017-04-30 MED ORDER — FERROUS SULFATE 325 (65 FE) MG PO TABS *WRAPPED* *I*
325.0000 mg | ORAL_TABLET | Freq: Two times a day (BID) | ORAL | 3 refills | Status: DC
Start: 2017-04-30 — End: 2017-07-02

## 2017-04-30 MED ORDER — MESALAMINE 800 MG PO TBEC *I*
2400.0000 mg | DELAYED_RELEASE_TABLET | Freq: Two times a day (BID) | ORAL | 2 refills | Status: DC
Start: 2017-04-30 — End: 2017-07-02

## 2017-04-30 NOTE — Progress Notes (Signed)
LCSW met patient this afternoon and introduced self and role. Writer encouraged patient to share his thoughts about his change of college plans. Writer explored patient's interest in accommodations for UC when he enters [employer]. Writer reviewed the accommodations that are typically requested for UC and provided patient with the information for how to request accommodations. Letter written to [facility] with specific accommodations requested and patient advised to call writer if he has any questions or requires anything further. LCSW remains available.

## 2017-05-08 ENCOUNTER — Ambulatory Visit: Payer: Medicaid Other | Admitting: Hematology and Oncology

## 2017-06-04 ENCOUNTER — Ambulatory Visit: Payer: Medicaid Other | Admitting: Primary Care

## 2017-06-04 ENCOUNTER — Other Ambulatory Visit: Payer: Self-pay | Admitting: Primary Care

## 2017-06-04 MED ORDER — OXYCODONE HCL 10 MG PO TABS *I*
10.0000 mg | ORAL_TABLET | ORAL | 0 refills | Status: DC | PRN
Start: 2017-06-04 — End: 2017-07-02

## 2017-06-04 NOTE — Telephone Encounter (Signed)
Patient Name: Cameron [name] Birth Date: 1996-07-03   Address: [address] [place], [place] [id number] Sex: Male   Rx Written Rx Dispensed Drug Quantity Days Supply Prescriber Name Payment Method Dispenser   04/30/2017 04/30/2017 oxycodone hcl 10 mg tablet  40 7 [name], Gwenith Spitziffany L MD Insurance [facility]   03/29/2017 03/29/2017 oxycodone hcl 10 mg tablet  40 7 [name], Gwenith Spitziffany L MD Insurance [facility]   02/22/2017 02/23/2017 oxycodone hcl 10 mg tablet  40 7 [name], Gwenith Spitziffany L MD [facility]   01/23/2017 01/23/2017 oxycodone hcl 10 mg tablet  40 7 [name], Gwenith Spitziffany L MD [facility]   01/01/2017 01/02/2017 oxycodone hcl 10 mg tablet  14 14 [name], [name] [facility]   12/04/2016 12/05/2016 oxycodone hcl 10 mg tablet  30 15 Diannia RuderNoronha, Suzie A (MD) [facility]   11/05/2016 11/06/2016 oxycodone hcl 10 mg tablet  30 15 Diannia RuderNoronha, Suzie A (MD) [facility]   10/02/2016 10/02/2016 oxycodone hcl 10 mg tablet  30 15 Diannia RuderNoronha, Suzie A (MD) [facility]   09/07/2016 09/09/2016 oxycodone hcl 10 mg tablet  30 15 Diannia RuderNoronha, Suzie A (MD) [facility]   08/03/2016 08/03/2016 oxycodone hcl 10 mg tablet  30 15 Diannia RuderNoronha, Suzie A (MD) [facility]   07/11/2016 07/12/2016 oxycodone hcl 10 mg tablet  30 15 Diannia RuderNoronha, Suzie A (MD) [facility]   06/08/2016 06/08/2016 oxycodone hcl 10 mg tablet  30 15 Vista DeckNoronha, Suzie A (MD) [facility]   * - Drugs marked with an asterisk are compound drugs. If the compound drug is made up of more than one controlled substance, then each controlled substance will be a separate row in the table.

## 2017-06-04 NOTE — Telephone Encounter (Signed)
Patient is all out of medication.   Writer advised patient's mother to have patient call at least three days ahead of time in order to allow the provider sufficient time to review patient's medication request.   Patient's mother states patient was under the impression that he had an appointment today.   Writer informed patient's mother that due to a change in the provider's schedule patient's appointment was cancelled, but patient is scheduled 9/25 with PCP.   Patient's mother expressed understanding.   Patient's mother can be reached at [phone] .

## 2017-06-04 NOTE — Telephone Encounter (Signed)
Please notify patient that refill completed to listed pharmacy

## 2017-06-18 ENCOUNTER — Ambulatory Visit: Payer: Medicaid Other | Admitting: Primary Care

## 2017-06-18 ENCOUNTER — Telehealth: Payer: Self-pay | Admitting: Primary Care

## 2017-06-18 NOTE — Telephone Encounter (Signed)
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  The secondary dx of another chronic disease is likely very challenging but he is stoic and reserved in clinic.  His anemia is worsening likely due to iron deficiency from his ulcerative colitis in the setting of sickle cell disease and he will need f/u with GI and hematology to optimize his management.  Can you both meet with him today he has an appointment with me?  I do not see a health home care manager assigned but he needs engageemnt

## 2017-07-02 ENCOUNTER — Ambulatory Visit: Payer: Medicaid Other | Attending: Primary Care | Admitting: Primary Care

## 2017-07-02 ENCOUNTER — Telehealth: Payer: Self-pay | Admitting: Primary Care

## 2017-07-02 ENCOUNTER — Encounter: Payer: Self-pay | Admitting: Primary Care

## 2017-07-02 VITALS — BP 128/60 | HR 107 | Ht 74.02 in | Wt 183.9 lb

## 2017-07-02 DIAGNOSIS — Z789 Other specified health status: Secondary | ICD-10-CM

## 2017-07-02 DIAGNOSIS — K5190 Ulcerative colitis, unspecified, without complications: Secondary | ICD-10-CM | POA: Insufficient documentation

## 2017-07-02 DIAGNOSIS — Z23 Encounter for immunization: Secondary | ICD-10-CM | POA: Insufficient documentation

## 2017-07-02 DIAGNOSIS — D571 Sickle-cell disease without crisis: Secondary | ICD-10-CM | POA: Insufficient documentation

## 2017-07-02 LAB — CBC AND DIFFERENTIAL
Baso # K/uL: 0 10*3/uL (ref 0.0–0.1)
Basophil %: 0.2 %
Eos # K/uL: 0.2 10*3/uL (ref 0.0–0.5)
Eosinophil %: 3.2 %
Hematocrit: 14 % — CL (ref 40–51)
Hemoglobin: 3.8 g/dL — ABNORMAL LOW (ref 13.7–17.5)
IMM Granulocytes #: 0 10*3/uL (ref 0.0–0.1)
IMM Granulocytes: 0.2 %
Lymph # K/uL: 1.4 10*3/uL (ref 1.3–3.6)
Lymphocyte %: 24 %
MCH: 15 pg/cell — ABNORMAL LOW (ref 26–32)
MCHC: 26 g/dL — ABNORMAL LOW (ref 32–37)
MCV: 59 fL — ABNORMAL LOW (ref 79–92)
Mono # K/uL: 0.6 10*3/uL (ref 0.3–0.8)
Monocyte %: 9.2 %
Neut # K/uL: 3.8 10*3/uL (ref 1.8–5.4)
Nucl RBC # K/uL: 0 10*3/uL (ref 0.0–0.0)
Nucl RBC %: 0 /100 WBC (ref 0.0–0.2)
Platelets: 318 10*3/uL (ref 150–330)
RBC: 2.5 MIL/uL — ABNORMAL LOW (ref 4.6–6.1)
RDW: 20.1 % — ABNORMAL HIGH (ref 11.6–14.4)
Seg Neut %: 63.2 %
WBC: 6 10*3/uL (ref 4.2–9.1)

## 2017-07-02 LAB — COMPREHENSIVE METABOLIC PANEL
ALT: 28 U/L (ref 0–50)
AST: 18 U/L (ref 0–50)
Albumin: 3.5 g/dL (ref 3.5–5.2)
Alk Phos: 127 U/L (ref 40–130)
Anion Gap: 11 (ref 7–16)
Bilirubin,Total: 0.4 mg/dL (ref 0.0–1.2)
CO2: 23 mmol/L (ref 20–28)
Calcium: 8.3 mg/dL — ABNORMAL LOW (ref 9.3–10.5)
Chloride: 104 mmol/L (ref 96–108)
Creatinine: 0.65 mg/dL (ref 0.50–1.00)
GFR,Black: 162 *
GFR,Caucasian: 140 *
Glucose: 93 mg/dL (ref 60–99)
Lab: 4 mg/dL — ABNORMAL LOW (ref 6–20)
Potassium: 3.8 mmol/L (ref 3.3–5.1)
Sodium: 138 mmol/L (ref 133–145)
Total Protein: 8.1 g/dL — ABNORMAL HIGH (ref 6.3–7.7)

## 2017-07-02 LAB — TIBC
Iron: 10 ug/dL — ABNORMAL LOW (ref 45–170)
TIBC: 359 ug/dL (ref 250–450)
Transferrin Saturation: 3 % — ABNORMAL LOW (ref 20–55)

## 2017-07-02 LAB — SEDIMENTATION RATE, AUTOMATED

## 2017-07-02 MED ORDER — OXYCODONE HCL 10 MG PO TABS *I*
10.0000 mg | ORAL_TABLET | ORAL | 0 refills | Status: DC | PRN
Start: 2017-07-02 — End: 2017-08-05

## 2017-07-02 MED ORDER — FERROUS SULFATE 325 (65 FE) MG PO TABS *WRAPPED* *I*
325.0000 mg | ORAL_TABLET | Freq: Two times a day (BID) | ORAL | 3 refills | Status: DC
Start: 2017-07-02 — End: 2017-08-26

## 2017-07-02 MED ORDER — MESALAMINE 800 MG PO TBEC *I*
2400.0000 mg | DELAYED_RELEASE_TABLET | Freq: Two times a day (BID) | ORAL | 2 refills | Status: DC
Start: 2017-07-02 — End: 2017-08-26

## 2017-07-02 MED ORDER — CLINDAMYCIN PHOS-BENZOYL PEROX 1-5 % EX GEL *A*
Freq: Two times a day (BID) | CUTANEOUS | 5 refills | Status: DC
Start: 2017-07-02 — End: 2018-04-29

## 2017-07-02 NOTE — Telephone Encounter (Signed)
Cameron [name]  [address]  [place] [address]  [phone] (home)   No e-mail address on record        Diagnosis must include one or more of the following:  Serious mental illness  no  HIV/AIDS  no  Mental Health condition no  Substance abuse disorder no  Asthma no  Diabetes no  Heart Disease no  BMI>25 no  Other chronic health condition (please explain) Yes - Sickle cell disease and ulcerative colitis    Care Management Needs:  Probable Risk for Adverse Event- (describe)( )  Repeated ER/Inpatient Use, including avoidable ER use no  Lack of Inadequate Housing /Social/ Family support yes - currently homeless  Lack of Connectivity with Healthcare System yes - multiple missed appointments  Non-adherence to treatments or medications or difficulty managing medications yes  Recent release from incarceration no  Recent release from psychiatric hospitalization no  Deficits in activities of daily living no  Learning or cognitive deficits no  Financial needs no    Risk/Safety:  Suicidal ideation no  History of suicide attempt no  Homicidal ideation no  History of violence no  Active substance abuse no  Unsafe Living Environment no    Any pertinent narrative  Discussed care management as resource for increased indpendence in navigating now dx of 2 chronic conditions and engagement with health system.  Patient very interested in care management.  Visit with Dina Rich today who informed nursing team that paitent does not require care management.    Please contact patient to enage, he has class until noon tomorrow and then work starting at 5 pm.  Health home eligible and has SIGNIFICANT care management needs

## 2017-07-02 NOTE — Telephone Encounter (Signed)
Lab called with "critical" HCT of 14  Baseline 20  Was just seen today and stable - no symptoms  Will need to arrange for transfusion in AM if possible

## 2017-07-03 ENCOUNTER — Telehealth: Payer: Self-pay | Admitting: Primary Care

## 2017-07-03 ENCOUNTER — Encounter: Payer: Self-pay | Admitting: Medical Oncology

## 2017-07-03 ENCOUNTER — Other Ambulatory Visit
Admission: RE | Admit: 2017-07-03 | Discharge: 2017-07-03 | Disposition: A | Discharge status: Home / Self Care | Payer: Medicaid Other | Source: Ambulatory Visit | Attending: Primary Care | Admitting: Primary Care

## 2017-07-03 ENCOUNTER — Ambulatory Visit: Payer: Medicaid Other | Attending: Primary Care

## 2017-07-03 VITALS — BP 115/58 | HR 75 | Temp 97.9°F | Resp 18

## 2017-07-03 DIAGNOSIS — Z789 Other specified health status: Secondary | ICD-10-CM | POA: Insufficient documentation

## 2017-07-03 DIAGNOSIS — D571 Sickle-cell disease without crisis: Secondary | ICD-10-CM

## 2017-07-03 DIAGNOSIS — D564 Hereditary persistence of fetal hemoglobin [HPFH]: Secondary | ICD-10-CM | POA: Insufficient documentation

## 2017-07-03 DIAGNOSIS — K5190 Ulcerative colitis, unspecified, without complications: Secondary | ICD-10-CM | POA: Insufficient documentation

## 2017-07-03 LAB — HEMOGLOBIN ELECTROPHORESIS
HGB F: 5.6 % — ABNORMAL HIGH (ref 0.0–1.9)
Hgb A2: 3 % (ref 2.2–3.2)
Hgb S: 91.4 %
Interp,HBE: ABNORMAL

## 2017-07-03 LAB — HGB ELECT,REVIEW

## 2017-07-03 LAB — RESOLUTION

## 2017-07-03 LAB — TYPE AND SCREEN
ABO RH Blood Type: O POS
Antibody Screen: NEGATIVE

## 2017-07-03 MED ORDER — ACETAMINOPHEN 325 MG PO TABS *I*
650.0000 mg | ORAL_TABLET | Freq: Once | ORAL | Status: AC
Start: 2017-07-03 — End: 2017-07-03
  Administered 2017-07-03: 650 mg via ORAL
  Filled 2017-07-03: qty 2

## 2017-07-03 NOTE — Addendum Note (Signed)
Addended by: Kirkland Hun on: 07/03/2017 10:47 AM     Modules accepted: Orders

## 2017-07-03 NOTE — Telephone Encounter (Signed)
Notifying hematologist as well - if no infusion appointment available he will need to [facility] ED - he has class from 9-12 today

## 2017-07-03 NOTE — Telephone Encounter (Signed)
Received call back from [facility], charge nurse at infusion  She reports she is willing to squeeze pt in at 1pm, but requests he comes early to get his labs drawn  [facility] requesting provider put in orders for type & screen and transfusions     Writer spoke to [facility] and advised of critical lab result  Writer explained the need for transfusion ASAP  Pt states he will go after class to the infusion center to get his labs drawn and for transfusion appt  Writer provided pt with address and advised him to call [facility] if for some reason he is unable to make this appt  Pt agreeable to this plan     Writer also spoke to CM, [place], who will call pt around 12:15pm to ensure he is on his way to appt

## 2017-07-03 NOTE — Telephone Encounter (Signed)
Mackenzie from [facility] labs called, stating patient's test for Sedimentation Rate has been canceled.  Per  Thea Silversmith the sample was insufficient quality.  Thea Silversmith can be reached at [phone].

## 2017-07-03 NOTE — Telephone Encounter (Signed)
Routing to MD to make aware.

## 2017-07-03 NOTE — Telephone Encounter (Signed)
Orders in for blood plan - can you check with infusion to make sure they have what they need?

## 2017-07-03 NOTE — Telephone Encounter (Signed)
Spoke to infusion charge nurse, [facility]  She reports she will have to call me back once she looks at the schedule to advise when pt can be seen  Will await call back

## 2017-07-03 NOTE — Telephone Encounter (Signed)
Spoke to [facility]  Orders for lab and transfusions are in and correct  No further needs at this time

## 2017-07-03 NOTE — Progress Notes (Signed)
Pt arrived to infusion stable.  Labs reviewed; Hct 14.  Pt requiring 2 Units PRBC.  Consent written with Katheren Puller NP.  Pt tolerated treatment through PIV which drew back blood pre and post infusion.  VS stable.  AVS refused.  Pt ambulated off floor independently.

## 2017-07-03 NOTE — Progress Notes (Signed)
The benefits, risks, and alternatives of blood transfusion were discussed with the patient.      Rafay Dahan III acknowledged understanding, questions were answered and wishes to proceed. Blood transfusion consent signed and will be in patient's chart (placed in bin to scan into electronic record).     Katheren Puller, NP

## 2017-07-05 LAB — RED BLOOD CELLS
Coded Blood type: 5100
Coded Blood type: 9500
Component blood type: O POS
Component blood type: O NEG
Dispense status: TRANSFUSED
Dispense status: TRANSFUSED

## 2017-07-05 NOTE — Telephone Encounter (Signed)
Thank you - can you reach out to Breylen to have him repat labs over this weekend (order in) and ideally he does a stool guiac and move up appt to next week with me or Dr. Beaulah Corin please

## 2017-07-05 NOTE — Telephone Encounter (Signed)
Spoke to [employer] he get lab work completed this weekend  States he will go tomorrow to have them drawn  Also notified him that Dr Concha Norway would like to check his stool for blood and requested he bring a stool sample to the lab  Also told him he can ask the lab tomorrow for equipment to assist him in stool collection  Pt verbalized understanding  Scheduled him with Dr Concha Norway for Wed 10/17 at 2:30

## 2017-07-05 NOTE — Addendum Note (Signed)
Addended byJannet Mantis on: 07/05/2017 04:18 PM     Modules accepted: Orders

## 2017-07-07 NOTE — Progress Notes (Signed)
[place]  [address]  [place] [address]  Phone: [phone]  Fax: [phone]    REASON FOR VISIT   F/U dx of UC disease and sickle cell HCM    PRIMARY DIAGNOSIS   Sickle Cell disease, type HgSS    SUBJECTIVE   Current concerns:  Jyren reports he has been doing "ok" working full time 6-7 days a week - has discovered that more frequent shorter shifts are better for his body and he experiences less back and leg pain with 4-5 hr shifts than longer  Reports he has had to take ibuprofen about 3-4 times per week and oxy IR 1-2 times per week over recent months  Has had trouble engaging with specialty services and aware he has not followed up with GI and hematology as he has been also taking a full class loas at [employer] so attending school evyer morning and working afternoon/evenings - he has also recently had to help his mother downsize their family to stay with their aunt since the house they were going to rent fell through and tthey are looking for a new one    SIckle Cell disease:  Rare issues with back and leg pain,   Has been predominantly been able to manage with ibuprofen  Trying to drink lots of water    Current home management:  Non pharmacologic interventions: rest, heat, fluids   Non opioid pain modifying medications: ibuprofent 600 mg  Q 6 hr  Short acting Opioid pain medications: oxy IR 1o mg Q 4 hr prn  Long acting Opioid pain medications: none    Current disease modifying therapy:not indicated - high native HgF      Ulcerative Colitis:  Has been doing better with taking mesalamine and iron more consistently - denies any GI sx, no longer having abdominal pain or cramping  Reports 1-2 stools per day, not formed but has not noted blood, mucous, change in odor  apppetite is good - has not modified diet    Social Determinants:    Bryceson lives with his mother, sister and brother in. When feeling sick, he relies on his mother for support. Blanca accesses his pharmacy by transport.  His main income comes from full time employment (started working at [employer]. attending [employer] FT to finish associates before transfer to [employer] in the fall 2019).Housing type: aunt's home with her family     Current Pain score:   Pain    07/02/17 1549   PainSc:   0 - No pain     Emergency Room visits since last visit: 0  Hospitalizations since last visit: 0    Medications Reviewed and changes     Current Outpatient Prescriptions:     clindamycin-benzoyl peroxide (BENZACLIN) gel, Apply topically 2 times daily, Disp: 25 g, Rfl: 5    mesalamine (ASACOL HD) 800 MG tablet, Take 3 tablets (2,400 mg total) by mouth 2 times daily, Disp: 180 tablet, Rfl: 2    ferrous sulfate 325 (65 FE) MG tablet, Take 1 tablet (325 mg total) by mouth 2 times daily (with meals), Disp: 100 tablet, Rfl: 3    oxyCODONE (ROXICODONE) 10 MG immediate release tablet, Take 1 tablet (10 mg total) by mouth every 4 hours as needed for Pain   Max daily dose: 60 mg, Disp: 40 tablet, Rfl: 0    naloxone (NARCAN) 4 mg/0.1 mL nasal spray, Instill 1 spray in 1 nostril once for opioid reversal. Repeat in alternating nostrils  with new package every 2-3 minutes until response, Disp: 2 each, Rfl: 5    Electronic Thermometer (DIGITAL THERMOMETER) MISC, Use as directed, Disp: 1 each, Rfl: 0  Review of Systems   Constitutional: Negative for activity change, appetite change, fatigue and fever.   HENT: Negative for mouth sores.    Respiratory: Negative for cough, chest tightness and shortness of breath.    Gastrointestinal: Negative for abdominal distention, abdominal pain, blood in stool, constipation, diarrhea, nausea and vomiting.   Neurological: Negative for dizziness.   Psychiatric/Behavioral: Negative for sleep disturbance.     OBJECTIVE     BP 128/60   Pulse 107   Ht 1.88 m (6' 2.02")   Wt 83.4 kg (183 lb 14.4 oz)   SpO2 100%   BMI 23.6 kg/m2  Physical Exam   Constitutional: He appears well-developed and well-nourished. No distress.    Pulmonary/Chest: Effort normal and breath sounds normal. No respiratory distress.   Abdominal: Soft. Bowel sounds are normal. He exhibits no distension. There is no tenderness. There is no rebound and no guarding.   Psychiatric: He has a normal mood and affect. His behavior is normal. Judgment and thought content normal.     ASSESSMENT / DIAGNOSIS   Rondale was seen today for follow up of new diagnosis of UC based on admission for acute LGI bleed and diffuse colitis on imaging and pathology and f/u of sicke cell disease.  Has continued on mesalamine  2400 mg since hospital discharge in May and missed GI f/u in July.    CM met with Noach today as well after examener to assess for needs or barriers to care prior to examiner visit and identified that Aris did not require CM.  In discussion with examener Amron outlined that he needs support in better understanding how to engage with specialty care, how to refill medications and to communicate with his schools about his medical conditions.  He is worried about living on his own without his family support and having two chronic illness.  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 Hessie Dibble to transition from pediatric hematology and the care of Dr. Marsa Aris - will consult with Dr. Marsa Aris on today's results and continue to re engage with adult  heme    Continue oxy IR 10 mg prn for VOC pain - doing well off routine dosing    No Patient Care Coordination Note on file.          Return in about 1 month (around 08/02/2017) for f/u.    Electronically signed by Maree Erie, MD 07/02/2017 3:31 PM  [place], Phone: [phone]

## 2017-07-07 NOTE — Progress Notes (Signed)
Cameron [name]  [address]  [place] [address]  Phone: [phone]  Fax: [phone]    REASON FOR VISIT   F/U dx of UC disease and sickle cell HCM    PRIMARY DIAGNOSIS   Sickle Cell disease, type HgSS    SUBJECTIVE   Current concerns:  Cameron [name] reports he has been doing "ok" has been trying to remember to take his asacol every day BID and his iron supplements - has been able to reliably take daily but sometimes missing second dosing  Has f/u scheudle with GI  No melanotic stools, stooling about twice a day with occasional upset stomach    Cameron [name] lives with his mother, sister and brother in single family home. When feeling sick, he relies on his  Mother for support. Cameron [name] accesses his pharmacy by transport. His main income comes from full time employment (started working at [employer]. 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    SIckle Cell disease:  Rare issues with back and leg pain,   Has been predominantly been able to manage with ibuprofen  Trying to drink lots of water    Current home management:  Non pharmacologic interventions: rest, heat, fluids   Non opioid pain modifying medications: ibuprofent 600 mg  Q 6 hr  Short acting Opioid pain medications: oxy IR 1o mg Q 4 hr prn  Long acting Opioid pain medications: none    Current Pain score:   Pain    04/30/17 1348   PainSc:   0 - No pain     Emergency Room visits since last visit: 0  Hospitalizations since last visit: 0    Current disease modifying therapy:not indicated - high native HgF    Medications Reviewed and changes     Current Outpatient Prescriptions:     naloxone (NARCAN) 4 mg/0.1 mL nasal spray, Instill 1 spray in 1 nostril once for opioid reversal. Repeat in alternating nostrils with new package every 2-3 minutes until response, Disp: 2 each, Rfl: 5    clindamycin-benzoyl peroxide (BENZACLIN) gel, Apply topically 2 times daily, Disp: 25 g,  Rfl: 5    mesalamine (ASACOL HD) 800 MG tablet, Take 3 tablets (2,400 mg total) by mouth 2 times daily, Disp: 180 tablet, Rfl: 2    ferrous sulfate 325 (65 FE) MG tablet, Take 1 tablet (325 mg total) by mouth 2 times daily (with meals), Disp: 100 tablet, Rfl: 3    oxyCODONE (ROXICODONE) 10 MG immediate release tablet, Take 1 tablet (10 mg total) by mouth every 4 hours as needed for Pain   Max daily dose: 60 mg, Disp: 40 tablet, Rfl: 0    Electronic Thermometer (DIGITAL THERMOMETER) MISC, Use as directed, Disp: 1 each, Rfl: 0  Review of Systems   Constitutional: Negative for activity change, appetite change, fatigue and fever.   HENT: Negative for mouth sores.    Respiratory: Negative for cough, chest tightness and shortness of breath.    Gastrointestinal: Negative for abdominal distention, abdominal pain, blood in stool, constipation, diarrhea, nausea and vomiting.   Neurological: Negative for dizziness.   Psychiatric/Behavioral: Negative for sleep disturbance.     OBJECTIVE     BP 118/58   Pulse 108   Ht 1.88 m (6' 2.02")   Wt 81.7 kg (180 lb 3.2 oz)   SpO2 99%   BMI 23.13 kg/m2  Physical Exam   Constitutional:  He appears well-developed and well-nourished. No distress.   Pulmonary/Chest: Effort normal and breath sounds normal. No respiratory distress.   Abdominal: Soft. Bowel sounds are normal. He exhibits no distension. There is no tenderness. There is no rebound and no guarding.   Psychiatric: He has a normal mood and affect. His behavior is normal. Judgment and thought content normal.     ASSESSMENT / DIAGNOSIS   Cameron [name] was seen today for follow up of new diagnosis of UC based on admission for acute LGI bleed and diffuse colitis on imaging and pathology and f/u of sicke cell disease.  Has continued on mesalamine  2400 mg since hospital discharge in May and missed GI f/u in July.  Major life transition has motivated Cameron [name] to defer college until next year.  Concerned about Cameron [name]' mood today given he has made  this major decision without disucssing per the patient, with his family or friends.  Visit completed by [facility] LCSW to engage him in discussion and assess for mental health needs given this new burden    CM met with Cameron [name] today as well after examener to assess for needs or barriers to care.    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:  Continue iron therapy and assess labs  Continue oxy IR 10 mg prn for VOC pain - doing well off routine dosing    Immunizations:   Sickle Cell Immunizations     Name Date    MENINGOCOCCAL (Bexsero) Oct 26, 16    MENINGOCOCCAL (Bexsero) Sep 15, 16    MENINGOCOCCAL [facility]) Aug 19, 11 12:00 AM    MENINGOCOCCAL [facility]) Aug 12, 10 12:00 AM    MENINGOCOCCAL CONJUGATE MCV4P Jan 29, 16    MENINGOCOCCAL Polysaccharide Jul 25, 01 12:00 AM    PNEUMOCOCCAL 13(PREVNAR 13) Nov 23, 15    PNEUMOCOCCAL(PNEUMOVAX) Sep 15, 16    PNEUMOCOCCAL(PNEUMOVAX) Nov 28, 01            Mental Health:  Current BH screening:  Recent Review Flowsheet Data     PHQ-2/9 Scores 01/01/2017    PHQ9 Calculated Score 0          Pain management plan of care:  Benefit outweighs Risk of opioid pain medications: yes   Opportunity to wean opioid: no  Agreed upon Treatment goals include:  Prevention of hospitalization     Urine Toxicology most recently collected on: 9/16  Urine toxicology results: appropriate    No Patient Care Coordination Note on file.          Return for SCD f/u.    Electronically signed by Maree Erie, MD 04/30/2017 3:17 PM  [place], Phone: [phone]

## 2017-07-09 NOTE — Telephone Encounter (Signed)
Left message on pt voicemail that we need lab work completed, remind him of appointment tomorrow at 2:30 and labs can be obtained at that time  Instructed him to call [facility] if he can not make appointment tomorrow

## 2017-07-09 NOTE — Telephone Encounter (Signed)
Patient has not had blood work done - can you have him come in between classess tomorrow for [place] to see him and draw labs

## 2017-07-10 ENCOUNTER — Ambulatory Visit: Payer: Medicaid Other | Admitting: Primary Care

## 2017-07-12 NOTE — Telephone Encounter (Signed)
Attempted to reach Cameron [name], no answer. Left voicemail to call back asap for an appointment. Number provided.

## 2017-07-12 NOTE — Telephone Encounter (Signed)
[facility] care manager, Rene KocherRegina, able to get in contact Cameron  [name] unable to come in today for an office visit and follow up lab work  We have asked him to go to a lab this weekend to have labs re-drawn  Pt scheduled at 11am with Dr. Beaulah Corinoyne on Tuesday for follow up

## 2017-07-16 ENCOUNTER — Ambulatory Visit: Payer: Medicaid Other | Admitting: Primary Care

## 2017-08-05 ENCOUNTER — Other Ambulatory Visit: Payer: Self-pay | Admitting: Primary Care

## 2017-08-05 NOTE — Telephone Encounter (Signed)
p 

## 2017-08-05 NOTE — Telephone Encounter (Signed)
Please notify patient that refill completed to listed pharmacy

## 2017-08-05 NOTE — Telephone Encounter (Signed)
Confidential Drug Report  Search Terms: Cameron BachelorLucius Rennie, 1996/06/19   Search Date: 08/05/2017 11:40:49 AM   Searching on behalf of: [id number] - Tiffany L [name]   The Drug Utilization Report below displays all of the controlled substance prescriptions, if any, that your patient has filled in the last twelve months. The information displayed on this report is compiled from pharmacy submissions to the Department, and accurately reflects the information as submitted by the pharmacies.  This report was requested by: Denton MeekKristie Orel Hord   Reference #: [id number]   Others' Prescriptions  Patient Name: Cameron [name] Birth Date: 1996/06/19   Address: [address] [place], [place] [id number] Sex: Male   Rx Written Rx Dispensed Drug Quantity Days Supply Prescriber Name Payment Method Dispenser   07/02/2017 07/02/2017 oxycodone hcl 10 mg tablet  40 7 [name], Gwenith Spitziffany L MD [facility]   06/04/2017 06/04/2017 oxycodone hcl 10 mg tablet  40 7 [name], Gwenith Spitziffany L MD [facility]   04/30/2017 04/30/2017 oxycodone hcl 10 mg tablet  40 7 [name], Gwenith Spitziffany L MD [facility] [employer]   03/29/2017 03/29/2017 oxycodone hcl 10 mg tablet  40 7 [name], Gwenith Spitziffany L MD [facility]   02/22/2017 02/23/2017 oxycodone hcl 10 mg tablet  40 7 [name], Gwenith Spitziffany L MD [facility]   01/23/2017 01/23/2017 oxycodone hcl 10 mg tablet  40 7 [name], Gwenith Spitziffany L MD [facility]   01/01/2017 01/02/2017 oxycodone hcl 10 mg tablet  14 14 [name], Gwenith Spitziffany L MD [facility]   12/04/2016 12/05/2016 oxycodone hcl 10 mg tablet  30 15 Diannia RuderNoronha, Suzie A (MD) [facility]   11/05/2016 11/06/2016 oxycodone hcl 10 mg tablet  30 15 Diannia RuderNoronha, Suzie A (MD) [facility]   10/02/2016 10/02/2016 oxycodone hcl 10 mg tablet  30 15 Diannia RuderNoronha, Suzie A (MD) [facility]   09/07/2016 09/09/2016 oxycodone hcl 10 mg tablet  30 15 Diannia RuderNoronha, Suzie A (MD) [facility]

## 2017-08-05 NOTE — Telephone Encounter (Signed)
Per patients mom, calling again for meds.

## 2017-08-06 ENCOUNTER — Ambulatory Visit: Payer: Medicaid Other | Admitting: Primary Care

## 2017-08-06 NOTE — Telephone Encounter (Signed)
Tried calling the patient and someone picks up the phone and then hangs up

## 2017-08-07 ENCOUNTER — Ambulatory Visit: Payer: Medicaid Other | Admitting: Primary Care

## 2017-08-09 ENCOUNTER — Other Ambulatory Visit: Payer: Self-pay | Admitting: Primary Care

## 2017-08-09 MED ORDER — OXYCODONE HCL 10 MG PO TABS *I*
ORAL_TABLET | ORAL | 0 refills | Status: DC
Start: 2017-08-09 — End: 2017-08-26

## 2017-08-09 NOTE — Telephone Encounter (Signed)
Cameron [name] - 29- 12 Prescriptions  Confidential Drug Report  Search Terms: Cameron [name], [birth year]   Search Date: 08/09/2017 11:20:44 AM   Searching on behalf of: [id number] - Tiffany L [name]   The Drug Utilization Report below displays all of the controlled substance prescriptions, if any, that your patient has filled in the last twelve months. The information displayed on this report is compiled from pharmacy submissions to the Department, and accurately reflects the information as submitted by the pharmacies.  This report was requested by: Desmond DikeMaisha Isaic Syler   Reference #: [id number]   Others' Prescriptions  Patient Name: Cameron [name] Birth Date: [birth year]   Address: [address] [place], [place] [id number] Sex: Male   Rx Written Rx Dispensed Drug Quantity Days Supply Prescriber Name Payment Method Dispenser   08/05/2017 08/06/2017 oxycodone hcl 10 mg tablet  40 7 [name], Gwenith Spitziffany L MD [facility]   07/02/2017 07/02/2017 oxycodone hcl 10 mg tablet  40 7 [name], Gwenith Spitziffany L MD [facility]   06/04/2017 06/04/2017 oxycodone hcl 10 mg tablet  40 7 [name], Gwenith Spitziffany L MD [facility]   04/30/2017 04/30/2017 oxycodone hcl 10 mg tablet  40 7 [name], Gwenith Spitziffany L MD [facility]   03/29/2017 03/29/2017 oxycodone hcl 10 mg tablet  40 7 [name], Gwenith Spitziffany L MD [facility]   02/22/2017 02/23/2017 oxycodone hcl 10 mg tablet  40 7 [name], Gwenith Spitziffany L MD [facility]   01/23/2017 01/23/2017 oxycodone hcl 10 mg tablet  40 7 [name], Gwenith Spitziffany L MD [facility]   01/01/2017 01/02/2017 oxycodone hcl 10 mg tablet  14 14 [name], [name] [facility]   12/04/2016 12/05/2016 oxycodone hcl 10 mg tablet  30 15 Diannia RuderNoronha, Suzie A (MD) [facility]   11/05/2016 11/06/2016 oxycodone hcl 10 mg tablet  30  15 Diannia RuderNoronha, Suzie A (MD) [facility]   10/02/2016 10/02/2016 oxycodone hcl 10 mg tablet  30 15 Diannia RuderNoronha, Suzie A (MD) [facility]   09/07/2016 09/09/2016 oxycodone hcl 10 mg tablet  30 15 Vista DeckNoronha, Suzie A (MD) [facility]   * - Drugs marked with an asterisk are compound drugs. If the compound drug is made up of more than one controlled substance, then each controlled substance will be a separate row in the table.      Report Suspicious Activity   Send Questions/Comments   Substance Abuse Treatment Information     Click the Report Suspicious Activity button to report information related to controlled substance suspicious activity to the [employer] Enforcement.   Click the Send Questions/Comments button to send questions about this report to the [facility], or call [phone].   Click the Substance Abuse Treatment Information button to go to the Office of Alcoholism and Substance Abuse Services website, www.[place].[employer].[place].gov or call [phone].

## 2017-08-09 NOTE — Telephone Encounter (Signed)
I am receiving incresed frequency of reill requests from Cameron [name] and he still has not repeated his labs since his transfusion - I have sent in this refill but he needs a follow up next week with me or another provider to draw labs and re-evaluate

## 2017-08-19 ENCOUNTER — Other Ambulatory Visit: Payer: Self-pay | Admitting: Primary Care

## 2017-08-19 NOTE — Telephone Encounter (Signed)
Confidential Drug Report  Search Terms: Cameron BachelorLucius Riese, [birth year]   Search Date: 08/19/2017 12:41:35 PM   Searching on behalf of: [id number] - Tiffany L [name]   The Drug Utilization Report below displays all of the controlled substance prescriptions, if any, that your patient has filled in the last twelve months. The information displayed on this report is compiled from pharmacy submissions to the Department, and accurately reflects the information as submitted by the pharmacies.  This report was requested by: Denton MeekKristie Darrill Vreeland   Reference #: [id number]   Others' Prescriptions  Patient Name: Cameron [name] Birth Date: [birth year]   Address: [address] [place], [place] [id number] Sex: Male   Rx Written Rx Dispensed Drug Quantity Days Supply Prescriber Name Payment Method Dispenser   08/09/2017 08/13/2017 oxycodone hcl 10 mg tablet  40 7 [name], Gwenith Spitziffany L MD [facility]     Patient Name: Cameron BachelorLucius Biby Birth Date: [birth year]   Address: [address] [place], [place] [id number] Sex: Male   Rx Written Rx Dispensed Drug Quantity Days Supply Prescriber Name Payment Method Dispenser   08/05/2017 08/06/2017 oxycodone hcl 10 mg tablet  40 7 [name], Gwenith Spitziffany L MD [facility]   07/02/2017 07/02/2017 oxycodone hcl 10 mg tablet  40 7 [name], Gwenith Spitziffany L MD [facility]   06/04/2017 06/04/2017 oxycodone hcl 10 mg tablet  40 7 [name], Gwenith Spitziffany L MD [facility]   04/30/2017 04/30/2017 oxycodone hcl 10 mg tablet  40 7 [name], Gwenith Spitziffany L MD [facility]   03/29/2017 03/29/2017 oxycodone hcl 10 mg tablet  40 7 [name], Gwenith Spitziffany L MD [facility]   02/22/2017 02/23/2017 oxycodone hcl 10 mg tablet  40 7 [name], Gwenith Spitziffany L MD [facility]   01/23/2017 01/23/2017 oxycodone hcl 10 mg tablet  40 7 [name], Gwenith Spitziffany L MD [facility]    01/01/2017 01/02/2017 oxycodone hcl 10 mg tablet  14 14 [name], Gwenith Spitziffany L MD [facility]   12/04/2016 12/05/2016 oxycodone hcl 10 mg tablet  30 15 Diannia RuderNoronha, Suzie A (MD) [facility]   11/05/2016 11/06/2016 oxycodone hcl 10 mg tablet  30 15 Diannia RuderNoronha, Suzie A (MD) [facility]   10/02/2016 10/02/2016 oxycodone hcl 10 mg tablet  30 15 Diannia RuderNoronha, Suzie A (MD) [facility]   09/07/2016 09/09/2016 oxycodone hcl 10 mg tablet  30 15 Diannia RuderNoronha, Suzie A (MD) [facility]

## 2017-08-21 ENCOUNTER — Other Ambulatory Visit: Payer: Self-pay | Admitting: Primary Care

## 2017-08-21 NOTE — Telephone Encounter (Signed)
Patients mom calling, inquiring the status of patients script.        Search Terms: Cameron [name], Oct 18, 1995   Search Date: 08/21/2017 01:26:38 PM   Searching on behalf of: [id number] - Tiffany L [name]   The Drug Utilization Report below displays all of the controlled substance prescriptions, if any, that your patient has filled in the last twelve months. The information displayed on this report is compiled from pharmacy submissions to the Department, and accurately reflects the information as submitted by the pharmacies.  This report was requested by: Tonny BollmanShandell Tyrrell Stephens   Reference #: [id number]   Others' Prescriptions  Patient Name: Cameron [name] Birth Date: Oct 18, 1995   Address: [address] [place], [place] [id number] Sex: Male   Rx Written Rx Dispensed Drug Quantity Days Supply Prescriber Name Payment Method Dispenser   08/09/2017 08/13/2017 oxycodone hcl 10 mg tablet  40 7 [name], Gwenith Spitziffany L MD [facility]     Patient Name: Cameron [name] Birth Date: Oct 18, 1995   Address: [address] [place], [place] [id number] Sex: Male   Rx Written Rx Dispensed Drug Quantity Days Supply Prescriber Name Payment Method Dispenser   08/05/2017 08/06/2017 oxycodone hcl 10 mg tablet  40 7 [name], Gwenith Spitziffany L MD [facility]   07/02/2017 07/02/2017 oxycodone hcl 10 mg tablet  40 7 [name], Gwenith Spitziffany L MD [facility]   06/04/2017 06/04/2017 oxycodone hcl 10 mg tablet  40 7 [name], Gwenith Spitziffany L MD [facility]   04/30/2017 04/30/2017 oxycodone hcl 10 mg tablet  40 7 [name], Gwenith Spitziffany L MD [facility]   03/29/2017 03/29/2017 oxycodone hcl 10 mg tablet  40 7 [name], Gwenith Spitziffany L MD [facility]   02/22/2017 02/23/2017 oxycodone hcl 10 mg tablet  40 7 [name], Gwenith Spitziffany L MD [facility]   01/23/2017 01/23/2017 oxycodone hcl 10 mg tablet  40 7 [name], Gwenith Spitziffany L MD  [facility]   01/01/2017 01/02/2017 oxycodone hcl 10 mg tablet  14 14 [name], [name] [facility]   12/04/2016 12/05/2016 oxycodone hcl 10 mg tablet  30 15 Diannia RuderNoronha, Suzie A (MD) [facility]   11/05/2016 11/06/2016 oxycodone hcl 10 mg tablet  30 15 Diannia RuderNoronha, Suzie A (MD) [facility]   10/02/2016 10/02/2016 oxycodone hcl 10 mg tablet  30 15 Diannia RuderNoronha, Suzie A (MD) [facility]   09/07/2016 09/09/2016 oxycodone hcl 10 mg tablet  30 15 Vista DeckNoronha, Suzie A (MD) [facility]   * - Drugs marked with an asterisk are compound drugs. If the compound drug is made up of more than one controlled substance, then each controlled substance will be a separate row in the table.      Report Suspicious Activity   Send Questions/Comments   Substance Abuse Treatment Information     Click the Report Suspicious Activity button to report information related to controlled substance suspicious activity to the [employer].   Click the Send Questions/Comments button to send questions about this report to the [facility]arcotic Enforcement, or call [phone].   Click the Substance Abuse Treatment Information button to go to the Office of Alcoholism and Substance Abuse Services website, www.[place].[employer].[place].gov or call [phone].    2018 [facility]

## 2017-08-22 NOTE — Telephone Encounter (Signed)
I am very concerned about Cameron [name] and his recent profound anemia - multiple visits have been cancelled - I cannot refill this until he is seen - can you call to have him come in this week with Dr. Beaulah Corinoyne

## 2017-08-22 NOTE — Telephone Encounter (Signed)
Per French Anaracy, pt mother if pt has to come in tomorrow it has to be before 12 because he works.    Dr. Beaulah Corinoyne does not have a schedule tomorrow, where should pt be scheduled?    Mom wants to be called back with appt because her son is working. Mom can be reached at [phone]

## 2017-08-22 NOTE — Telephone Encounter (Signed)
Lm on vm for the patient to call the office.

## 2017-08-23 ENCOUNTER — Telehealth: Payer: Self-pay

## 2017-08-23 NOTE — Telephone Encounter (Signed)
He can see Dr. Vilma MeckelBusick at 11 or on Monday morning Dr. Lorenz CoasterLie

## 2017-08-23 NOTE — Telephone Encounter (Signed)
Patient scheduled to see Dr. Lorenz CoasterLie on Monday 08/26/17 at 9:30am.  Pt and patients mom aware of appt.

## 2017-08-23 NOTE — Telephone Encounter (Signed)
CM contacted and left a VM to remind pt [facility]/2017 at 9:30am.

## 2017-08-26 ENCOUNTER — Encounter: Payer: Self-pay | Admitting: Pediatrics

## 2017-08-26 ENCOUNTER — Ambulatory Visit: Payer: MEDICAID | Attending: Pediatrics | Admitting: Pediatrics

## 2017-08-26 ENCOUNTER — Telehealth: Payer: Self-pay | Admitting: Primary Care

## 2017-08-26 ENCOUNTER — Other Ambulatory Visit: Payer: Self-pay | Admitting: Pediatrics

## 2017-08-26 VITALS — BP 120/64 | HR 81 | Ht 74.02 in | Wt 176.5 lb

## 2017-08-26 DIAGNOSIS — K5190 Ulcerative colitis, unspecified, without complications: Secondary | ICD-10-CM

## 2017-08-26 DIAGNOSIS — D564 Hereditary persistence of fetal hemoglobin [HPFH]: Secondary | ICD-10-CM | POA: Insufficient documentation

## 2017-08-26 DIAGNOSIS — D571 Sickle-cell disease without crisis: Secondary | ICD-10-CM | POA: Insufficient documentation

## 2017-08-26 DIAGNOSIS — M549 Dorsalgia, unspecified: Secondary | ICD-10-CM | POA: Insufficient documentation

## 2017-08-26 LAB — CBC AND DIFFERENTIAL
Baso # K/uL: 0 10*3/uL (ref 0.0–0.1)
Basophil %: 0.2 %
Eos # K/uL: 0.3 10*3/uL (ref 0.0–0.5)
Eosinophil %: 6.4 %
Hematocrit: 16 % — CL (ref 40–51)
Hemoglobin: 4.2 g/dL — ABNORMAL LOW (ref 13.7–17.5)
IMM Granulocytes #: 0 10*3/uL (ref 0.0–0.1)
IMM Granulocytes: 0.2 %
Lymph # K/uL: 1.1 10*3/uL — ABNORMAL LOW (ref 1.3–3.6)
Lymphocyte %: 27.1 %
MCH: 16 pg/cell — ABNORMAL LOW (ref 26–32)
MCHC: 27 g/dL — ABNORMAL LOW (ref 32–37)
MCV: 58 fL — ABNORMAL LOW (ref 79–92)
Mono # K/uL: 0.5 10*3/uL (ref 0.3–0.8)
Monocyte %: 11.3 %
Neut # K/uL: 2.2 10*3/uL (ref 1.8–5.4)
Nucl RBC # K/uL: 0 10*3/uL (ref 0.0–0.0)
Nucl RBC %: 0 /100 WBC (ref 0.0–0.2)
Platelets: 323 10*3/uL (ref 150–330)
RBC: 2.7 MIL/uL — ABNORMAL LOW (ref 4.6–6.1)
RDW: 20.8 % — ABNORMAL HIGH (ref 11.6–14.4)
Seg Neut %: 54.8 %
WBC: 4.1 10*3/uL — ABNORMAL LOW (ref 4.2–9.1)

## 2017-08-26 LAB — PAIN CLINIC PROFILE
Amphetamine,UR: NEGATIVE
Benzodiazepinen,UR: NEGATIVE
Cocaine/Metab,UR: NEGATIVE
Opiates,UR: NEGATIVE
Oxycodone/Oxymorphone,UR: NEGATIVE
THC Metabolite,UR: NEGATIVE

## 2017-08-26 MED ORDER — MESALAMINE 800 MG PO TBEC *I*
2400.0000 mg | DELAYED_RELEASE_TABLET | Freq: Two times a day (BID) | ORAL | 2 refills | Status: DC
Start: 2017-08-26 — End: 2017-12-25

## 2017-08-26 MED ORDER — OXYCODONE HCL 10 MG PO TABS *I*
ORAL_TABLET | ORAL | 0 refills | Status: DC
Start: 2017-08-26 — End: 2017-09-02

## 2017-08-26 MED ORDER — FERROUS SULFATE 325 (65 FE) MG PO TABS *WRAPPED* *I*
325.0000 mg | ORAL_TABLET | Freq: Two times a day (BID) | ORAL | 3 refills | Status: DC
Start: 2017-08-26 — End: 2018-01-21

## 2017-08-26 NOTE — Telephone Encounter (Signed)
[facility] stating patient's has a critical Hematocrit which is 15.5.  Onalee HuaDavid can be reached at  [phone].

## 2017-08-26 NOTE — Progress Notes (Deleted)
Cameron [name]  [address]  [place] [address]  Phone: [phone]  Fax: [phone]    REASON FOR VISIT   Sickle Cell Health Maintenance Visit     PRIMARY DIAGNOSIS   Sickle Cell disease, type {CCC Sickle Cell type:28944}    SUBJECTIVE   Current concerns:  None,   Uses his oxydoeon once daily before work.  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  Uses 6 tabs per week. No constipation. Denies blood in stool or urine. denis abdominal pain, gets pain intermittently, also intermittently has diarrhea,  occas mucus  Social Determinants of Health:  Social Determinants of Care    Current home pain management plan:  Non pharmacologic interventions: ***   Non opioid pain modifying medications: *** mg  Q *** hr  Short acting Opioid pain medications: *** mg Q *** hr  Long acting Opioid pain medications: *** mg Q *** hr    PEG:  Prior (PEG) Pain Score:   Current Pain score:   Pain    08/26/17 0921   PainSc:   0 - No pain     Pain on average the last week: {gen number 9-56:387564}  Degree of interference with Enjoyment of life in last week: {gen number 3-32:951884}  How has pain interfered with your General activity in the last week: {gen number 1-66:063016}  PEG SCORE ***    Emergency Room visits since last visit: {gen number 0-10:932355}  Hospitalizations since last visit: {gen number 7-32:202542}    Current disease modifying therapy:{CCC Sickle Cell Dis Mod Therapy:28945}    Medications Reviewed and changes     Current Outpatient Prescriptions:     oxyCODONE (ROXICODONE) 10 MG immediate release tablet, Take 1 tablet (10 mg total) by mouth every 4 hours as needed for Pain.  Max daily dose: 6 tablets, Disp: 40 tablet, Rfl: 0    clindamycin-benzoyl peroxide (BENZACLIN) gel, Apply topically 2 times daily, Disp: 25 g, Rfl: 5    mesalamine (ASACOL HD) 800 MG tablet, Take 3 tablets (2,400 mg total) by mouth 2 times daily, Disp: 180 tablet, Rfl: 2    ferrous sulfate 325 (65 FE) MG tablet, Take 1 tablet (325  mg total) by mouth 2 times daily (with meals), Disp: 100 tablet, Rfl: 3    Electronic Thermometer (DIGITAL THERMOMETER) MISC, Use as directed, Disp: 1 each, Rfl: 0    naloxone (NARCAN) 4 mg/0.1 mL nasal spray, Instill 1 spray in 1 nostril once for opioid reversal. Repeat in alternating nostrils with new package every 2-3 minutes until response, Disp: 2 each, Rfl: 5  Review of Systems  OBJECTIVE     BP 120/64    Pulse 81    Ht 1.88 m (6' 2.02")    Wt 80.1 kg (176 lb 8 oz)    SpO2 100%    BMI 22.65 kg/m   Physical Exam  ASSESSMENT / DIAGNOSIS   There are no diagnoses linked to this encounter.  Disease modifying therapy:  {CCC Sickle Cell Disease Modifying Therapy:29087}  {Hydroxyurea Discontinuation:29519}    Iron overload:  Ferritin   Date Value Ref Range Status   04/30/2017 5 (L) 20 - 250 ng/mL Final       {CCC SC Iron/Ferriton Monitoring:29088}  Last echo:    Last LFT:    Lab results: 07/02/17  1719  02/07/17  2014   Total Protein 8.1*  < > 8.3*   Albumin 3.5  < > 3.7   ALT 28  < > 21  AST 18  < > 13   Alk Phos 127  < > 91   Bilirubin,Total 0.4  < > 0.7   Bilirubin,Direct  --   --  <0.2   < > = values in this interval not displayed.    Last hepatic MRI: No procedure found.    AVN:  Current joint sx:  Joint involvement on imaging:{Location; Avascular Necrosis:29510}  Last imaging evaluation of involved joints:    Renal screening:  Last urine protein: No results found for: UTPR    Pulmonary screening:     Supplemental O2?  SpO2: 100 %  Heart Rate: 81  Baseline FEV1:    Baseline FVC:    Current spirometry results:   Current respiratory status:     Cardiac Screening:  Current symptoms present of CHF: {yes no:314532}  Last Echocardiogram:   Findings:{DESC; Sickle Cell Cardiac:29516}      Infection management plan:  - patient is functionally asplenic {yes no:314532}  -patient reminded of risk of infection given asplenia  -reminded to seek medical assistance immediately for fever (>38.3C or 100.52F )  -advised to call  our clinic, PCP or other urgent medical care  -should be evaluated within 4 hours of fever    Immunizations:   Sickle Cell Immunizations     Name Date    MENINGOCOCCAL (Bexsero) Oct 26, 16    MENINGOCOCCAL (Bexsero) Sep 15, 16    MENINGOCOCCAL [facility]) Aug 19, 11 12:00 AM    MENINGOCOCCAL [facility]) Aug 12, 10 12:00 AM    MENINGOCOCCAL CONJUGATE MCV4P Jan 29, 16    MENINGOCOCCAL Polysaccharide Jul 25, 01 12:00 AM    PNEUMOCOCCAL 13(PREVNAR 13) Nov 23, 15    PNEUMOCOCCAL(PNEUMOVAX) Sep 15, 16    PNEUMOCOCCAL(PNEUMOVAX) Nov 28, 01          Oral Health:  Dental Exam every 6 months: last exam  Wisdom teeth status:    Ocular screening:  Last eye exam:   Findings:    Reproductive health:  Male:  HIV screening age 61-65   HIV 1&2 ANTIGEN/ANTIBODY   Date/Time Value Ref Range Status   06/09/2015 04:18 PM Nonreactive  Final     Comment:     Test Method: CMIA     Last CT screen:   Chlamydia Plasmid DNA Amplification   Date/Time Value Ref Range Status   06/09/2015 04:18 PM .  Final   No results found for: CLAMYDCU  Last GC screen: No results found for: G      Mental Health:  Current BH screening:  Recent Review Flowsheet Data     PHQ-2/9 Scores 01/01/2017    PHQ9 Calculated Score 0        _0 @  Referral made to behavioral health: {yes no:315493::"Yes"}    Pain management plan of care:  Benefit outweighs Risk of opioid pain medications: ***   Opportunity to wean opioid: ***  Agreed upon Treatment goals include:  ***     Urine Toxicology most recently collected on: ***  Urine toxicology results:      Comment:  ***  Collected today: {yes no:314532}    No Patient Care Coordination Note on file.      There are no Patient Instructions on file for this visit.    No Follow-up on file.    Electronically signed by Doretha Imus, MD 08/26/2017 9:36 AM  [place], Phone: [phone]

## 2017-08-26 NOTE — Patient Instructions (Addendum)
Be sure to call the GI doctor to schedule an appointment [phone].    [facility]    We will call you with the lab results.    Download MEDISAFE app to help with pill reminders.

## 2017-08-26 NOTE — Progress Notes (Signed)
This report was requested by: Laruth BouchardJennifer Asher Babilonia   Reference #: [id number]   Others' Prescriptions  Patient Name: Cameron [name] Birth Date: Dec 02, 1995   Address: [address] [place], [place] [address] Sex: Male   Rx Written Rx Dispensed Drug Quantity Days Supply Prescriber Name Payment Method Dispenser   08/09/2017 08/13/2017 oxycodone hcl 10 mg tablet  40 7 [name], Gwenith Spitziffany L MD [facility]     Patient Name: Cameron [name] Birth Date: Dec 02, 1995   Address: [address] [place], [place] [address] Sex: Male   Rx Written Rx Dispensed Drug Quantity Days Supply Prescriber Name Payment Method Dispenser   08/05/2017 08/06/2017 oxycodone hcl 10 mg tablet  40 7 [name], Gwenith Spitziffany L MD [employer] [facility] [employer]   07/02/2017 07/02/2017 oxycodone hcl 10 mg tablet  40 7 [name], Gwenith Spitziffany L MD [facility] [employer]   06/04/2017 06/04/2017 oxycodone hcl 10 mg tablet  40 7 [name], Gwenith Spitziffany L MD [facility] [employer]   04/30/2017 04/30/2017 oxycodone hcl 10 mg tablet  40 7 [name], Gwenith Spitziffany L MD [facility] [employer]   03/29/2017 03/29/2017 oxycodone hcl 10 mg tablet  40 7 [name], Gwenith Spitziffany L MD [employer] [facility] [employer]   02/22/2017 02/23/2017 oxycodone hcl 10 mg tablet  40 7 [name], Gwenith Spitziffany L MD [facility] [employer]   01/23/2017 01/23/2017 oxycodone hcl 10 mg tablet  40 7 [name], Gwenith Spitziffany L MD [facility] [employer]   01/01/2017 01/02/2017 oxycodone hcl 10 mg tablet  14 14 [name], Gwenith Spitziffany L MD [employer] [facility] [employer]   12/04/2016 12/05/2016 oxycodone hcl 10 mg tablet  30 15 Diannia RuderNoronha, Suzie A (MD) [facility] [employer]   11/05/2016 11/06/2016 oxycodone hcl 10 mg tablet  30 15 Diannia RuderNoronha, Suzie A (MD) [facility]   10/02/2016 10/02/2016 oxycodone hcl 10 mg tablet  30 15 Diannia RuderNoronha, Suzie A (MD) [employer] [facility] [employer]   09/07/2016  09/09/2016 oxycodone hcl 10 mg tablet  30 15 Diannia RuderNoronha, Suzie A (MD) [facility]

## 2017-08-26 NOTE — Progress Notes (Signed)
[facility]  [address]  [place] [address]  Phone: [phone]  Fax: [phone]    REASON FOR VISIT     Sickle Cell    PRIMARY DIAGNOSIS     Sickle cell with HPFH    SUBJECTIVE     Patient last seen 2 months ago and noted to have severe anemia Hct 14, received 2 units PRBC and has felt fine since then but has not gotten any labs rechecked.  He goes to school in the morning and then goes to work in the evenings so he has been busy.    He reports back pain which is typical for him.  He uses one oxycodone 10mg  on days that he works 6/7 days per week due to his back pain.  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 No constipation. Denies blood in stool or urine. He is only taking ferrous sulfate once daily instead of twice daily.     He also has had a recent UC diagnosis for which he has not followed up with GI.  He is only taking one tablet of asacol per day when he is supposed to be taking 6 tabs per day.  He does report intermittent abdominal pain, diarrhea and mucus in the stool but it doesn't bother him and he has not rescheduled a missed GI appointment that was in July.    There are young children in the home so he cannot leave his meds out in his room.  We discussed trying to use a reminder system and gave him name of an app that can alarm to remind him to take the evening pills.     Past Medical History, Surgical History, Social History, Family History, and Medications/allergies reviewed during this visit    Current Outpatient Prescriptions   Medication    ferrous sulfate 325 (65 FE) MG tablet    mesalamine (ASACOL HD) 800 MG tablet    oxyCODONE (ROXICODONE) 10 MG immediate release tablet    clindamycin-benzoyl peroxide (BENZACLIN) gel    Electronic Thermometer (DIGITAL THERMOMETER) MISC    naloxone (NARCAN) 4 mg/0.1 mL nasal spray     No current facility-administered medications for this visit.        Review of Systems as per HPI above      OBJECTIVE      BP 120/64    Pulse 81    Ht 1.88 m (6' 2.02")    Wt 80.1 kg (176 lb 8 oz)    SpO2 100%    BMI 22.65 kg/m       Physical Exam   Constitutional: He is oriented to person, place, and time and well-developed, well-nourished, and in no distress. No distress.   HENT:   Head: Normocephalic and atraumatic.   Right Ear: External ear normal.   Left Ear: External ear normal.   Nose: Nose normal.   Mouth/Throat: Oropharynx is clear and moist. No oropharyngeal exudate.   hypertelorism   Eyes: Pupils are equal, round, and reactive to light. Conjunctivae and EOM are normal. Right eye exhibits no discharge. Left eye exhibits no discharge. No scleral icterus.   Mild conjunctival pallor, no icterus     Neck: Normal range of motion. Neck supple.   Cardiovascular: Normal rate, regular rhythm and normal heart sounds.    Pulmonary/Chest: Effort normal and breath sounds normal.   Abdominal: Soft. Bowel sounds are normal. He exhibits no mass. There is no tenderness. There is no  rebound and no guarding.   Musculoskeletal: Normal range of motion. He exhibits no edema or tenderness.   Lymphadenopathy:     He has no cervical adenopathy.   Neurological: He is alert and oriented to person, place, and time. Gait normal.   Skin: Skin is warm and dry. He is not diaphoretic.   Psychiatric: Mood, memory, affect and judgment normal.         ASSESSMENT / DIAGNOSIS     1. Sickle cell disease  Pain clinic profile    CBC and differential    Pain clinic profile   2. 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 Back pain, unspecified back location, unspecified back pain laterality, unspecified chronicity     4. Ulcerative colitis       PLAN:  Sickle cell with Hb F persistence - patient overall is doing well with no recent VOC or hospitalizations in the last 6 months.  He is not good at remembering to take his evening meds and encouraged him to set an alarm in his phone to remind him to take iron  twice daily.  Will recheck CBC today.  Short acting opioid medicine refilled today, istop checked. Utox sent today     UC - new diagnosis after hospitalization last May, gave him number to reschedule GI appointment.  Emphasized importance of taking Asacol as prescribed, 3 tablets twice a day.  He is currently taking one tablet daily.    F/u with PCP next week as scheduled.    Orders Placed This Encounter   Procedures    Pain clinic profile     Standing Status:   Future     Number of Occurrences:   1     Standing Expiration Date:   09/25/2018     Order Specific Question:   Quant THC if positive     Answer:   UCDS ONLY     Order Specific Question:   Drug confirmations     Answer:   YES         Patient Instructions   Be sure to call the GI doctor to schedule an appointment [phone].    [facility]    We will call you with the lab results.    Download MEDISAFE app to help with pill reminders.        --Patient instructed to call if symptoms are not improving or worsening  --Follow-up arranged  No Follow-up on file.    Electronically signed by Midge MiniumARIADNE Maikel Neisler, MD , 08/26/2017 @   [facility], Phone: [phone]

## 2017-08-26 NOTE — Telephone Encounter (Signed)
Attempted to reach pt at all numbers listed in chart  Left VM on one, other two numbers have been disconnected  Will reattempt contact in the morning

## 2017-08-27 ENCOUNTER — Encounter: Payer: Self-pay | Admitting: Primary Care

## 2017-08-27 DIAGNOSIS — Z5321 Procedure and treatment not carried out due to patient leaving prior to being seen by health care provider: Secondary | ICD-10-CM

## 2017-08-27 NOTE — Telephone Encounter (Signed)
Dr.Tiffany [name] calling from patient's PCP. She's requesting missed 04/18/17 FUV with Dr.Cheon to be rescheduled for a very soon date if possible. Patient is experiencing very low blood count and other symptoms. She csn be web paged or contacted at [phone].

## 2017-08-27 NOTE — Telephone Encounter (Signed)
Called patient. Patient said he can come in on 12/18 if dr. Nedra HaiLee says that's okay.

## 2017-08-27 NOTE — Telephone Encounter (Signed)
Received call back from infusion  Able to get pt in Saturday morning at 8am  Requesting pt get labs drawn Friday for type and screen     Spoke to pt, he is agreeable with 8am on Saturday appointment  Able to get labs drawn Friday morning but requesting a reminder call Thursday evening   Postponing task for nursing to call pt Thursday afternoon  Advised [facility] to [facility] or on-call if he becomes symptomatic before his Saturday appointment - pt verbalized understanding

## 2017-08-27 NOTE — Telephone Encounter (Signed)
LM for GI to reschedule appointment with fellow - they will page me with an appointment  Could you work to reschedule appointment with Dr Lana FishQazi?

## 2017-08-27 NOTE — Telephone Encounter (Signed)
Spoke to Cameron [name]   Provided lab results from yesterday draw and explained need for blood transfusion at this time  Pt agreeable to transfusion, stating his next day off is Saturday and he is unable to go before that  D/w Dr. Lorenz CoasterLie who is ok with pt waiting until Saturday as long as he remains asymptomatic    Spoke to infusion center  They are looking at their schedule on Saturday and will call writer back with availability

## 2017-08-28 NOTE — Telephone Encounter (Signed)
Called patient. Scheduled fuv with dr. Nedra HaiLee for 12/18 at 11am on ac5. Patient confirmed.

## 2017-08-29 NOTE — Telephone Encounter (Signed)
Spoke to Cameron [name]  Reminded him to get labs drawn tomorrow morning for Saturday's infusion center appointment  Pt verbalized understanding stating he is planning to go in the am

## 2017-08-30 ENCOUNTER — Other Ambulatory Visit: Payer: Self-pay | Admitting: Pediatrics

## 2017-08-31 ENCOUNTER — Ambulatory Visit: Payer: MEDICAID | Attending: Pediatrics | Admitting: Oncology

## 2017-08-31 VITALS — BP 108/49 | HR 76 | Temp 99.1°F | Resp 16

## 2017-08-31 DIAGNOSIS — D571 Sickle-cell disease without crisis: Secondary | ICD-10-CM | POA: Insufficient documentation

## 2017-08-31 DIAGNOSIS — D649 Anemia, unspecified: Secondary | ICD-10-CM

## 2017-08-31 DIAGNOSIS — D564 Hereditary persistence of fetal hemoglobin [HPFH]: Secondary | ICD-10-CM

## 2017-08-31 DIAGNOSIS — K5190 Ulcerative colitis, unspecified, without complications: Secondary | ICD-10-CM | POA: Insufficient documentation

## 2017-08-31 LAB — CBC
Hematocrit: 14 % — CL (ref 40–51)
Hemoglobin: 3.8 g/dL — ABNORMAL LOW (ref 13.7–17.5)
MCH: 16 pg/cell — ABNORMAL LOW (ref 26–32)
MCHC: 27 g/dL — ABNORMAL LOW (ref 32–37)
MCV: 57 fL — ABNORMAL LOW (ref 79–92)
Platelets: 262 10*3/uL (ref 150–330)
RBC: 2.5 MIL/uL — ABNORMAL LOW (ref 4.6–6.1)
RDW: 20.5 % — ABNORMAL HIGH (ref 11.6–14.4)
WBC: 4.3 10*3/uL (ref 4.2–9.1)

## 2017-08-31 LAB — TYPE AND SCREEN
ABO RH Blood Type: O POS
Antibody Screen: NEGATIVE

## 2017-08-31 MED ORDER — SODIUM CHLORIDE 0.9 % IV SOLN WRAPPED *I*
30.0000 mL/h | Status: DC | PRN
Start: 2017-08-31 — End: 2017-08-31
  Administered 2017-08-31: 30 mL/h via INTRAVENOUS

## 2017-08-31 MED ORDER — ACETAMINOPHEN 325 MG PO TABS *I*
650.0000 mg | ORAL_TABLET | Freq: Once | ORAL | Status: AC
Start: 2017-08-31 — End: 2017-08-31
  Administered 2017-08-31: 650 mg via ORAL
  Filled 2017-08-31: qty 2

## 2017-08-31 NOTE — Progress Notes (Signed)
HCT 14 platelets 262 today. Patient tolerated 2 units RBC without any issues. VSS throughout infusions. PIV with + blood return before and after infusion. Next apt scheduled, discharged home in stable condition.

## 2017-09-01 LAB — RED BLOOD CELLS
Coded Blood type: 9500
Coded Blood type: 9500
Component blood type: O NEG
Component blood type: O NEG
Dispense status: TRANSFUSED
Dispense status: TRANSFUSED

## 2017-09-02 ENCOUNTER — Other Ambulatory Visit: Payer: Self-pay | Admitting: Primary Care

## 2017-09-02 MED ORDER — OXYCODONE HCL 10 MG PO TABS *I*
ORAL_TABLET | ORAL | 0 refills | Status: DC
Start: 2017-09-02 — End: 2017-09-10

## 2017-09-02 NOTE — Telephone Encounter (Signed)
Cameron [name] - 16- 14 Prescriptions  Confidential Drug Report  Search Terms: Cameron [name], 11/06/1995   Search Date: 09/02/2017 01:29:53 PM   Searching on behalf of: [id number] - Tiffany L [name]   The Drug Utilization Report below displays all of the controlled substance prescriptions, if any, that your patient has filled in the last twelve months. The information displayed on this report is compiled from pharmacy submissions to the Department, and accurately reflects the information as submitted by the pharmacies.  This report was requested by: Desmond DikeMaisha Jerred Zaremba   Reference #: [id number]   Others' Prescriptions  Patient Name: Cameron [name] Birth Date: 11/06/1995   Address: [address] [place], [place] [address] Sex: Male   Rx Written Rx Dispensed Drug Quantity Days Supply Prescriber Name Payment Method Dispenser   08/26/2017 08/27/2017 oxycodone hcl 10 mg tablet  40 7 Karle BarrLie, Ariadne G MD [employer] [facility] [employer]   08/09/2017 08/13/2017 oxycodone hcl 10 mg tablet  40 7 [name], Gwenith Spitziffany L MD [employer] [facility] [employer] [facility]     Patient Name: Cameron [name] Birth Date: 11/06/1995   Address: [address] [place], [place] [address] Sex: Male   Rx Written Rx Dispensed Drug Quantity Days Supply Prescriber Name Payment Method Dispenser   08/05/2017 08/06/2017 oxycodone hcl 10 mg tablet  40 7 [name], Gwenith Spitziffany L MD [employer] [facility] [employer] [facility]   07/02/2017 07/02/2017 oxycodone hcl 10 mg tablet  40 7 [name], Gwenith Spitziffany L MD [facility] [employer]   06/04/2017 06/04/2017 oxycodone hcl 10 mg tablet  40 7 [name], Gwenith Spitziffany L MD [facility] [employer]   04/30/2017 04/30/2017 oxycodone hcl 10 mg tablet  40 7 [name], Gwenith Spitziffany L MD [facility] [employer]   03/29/2017 03/29/2017 oxycodone hcl 10 mg tablet  40 7 [name], Gwenith Spitziffany L MD [facility] [employer]   02/22/2017 02/23/2017 oxycodone hcl 10 mg tablet  40 7 [name], Gwenith Spitziffany L MD Insurance [facility] [employer]   01/23/2017 01/23/2017 oxycodone hcl 10 mg tablet  40 7 [name], Gwenith Spitziffany L MD [facility] [employer]   01/01/2017 01/02/2017 oxycodone hcl 10 mg tablet  14 14 [name], [name] [employer] [facility] [employer]   12/04/2016 12/05/2016 oxycodone hcl 10 mg tablet  30 15 Diannia RuderNoronha, Suzie A (MD) [facility] [employer]   11/05/2016 11/06/2016 oxycodone hcl 10 mg tablet  30 15 Diannia RuderNoronha, Suzie A (MD) [facility] [employer]   10/02/2016 10/02/2016 oxycodone hcl 10 mg tablet  30 15 Diannia RuderNoronha, Suzie A (MD) [facility]   09/07/2016 09/09/2016 oxycodone hcl 10 mg tablet  30 15 Vista DeckNoronha, Suzie A (MD) [facility]   * - Drugs marked with an asterisk are compound drugs. If the compound drug is made up of more than one controlled substance, then each controlled substance will be a separate row in the table.      Report Suspicious Activity   Send Questions/Comments   Substance Abuse Treatment Information     Click the Report Suspicious Activity button to report information related to controlled substance suspicious activity to the [employer].   Click the Send Questions/Comments button to send questions about this report to the [facility] of Narcotic Enforcement, or call [phone].   Click the Substance Abuse Treatment Information button to go to the Office of Alcoholism and Substance Abuse Services  website, www.oasas.thelispenard.com or call 763-661-9319.

## 2017-09-02 NOTE — Telephone Encounter (Signed)
Please notify patient that refill completed to listed pharmacy

## 2017-09-03 ENCOUNTER — Ambulatory Visit: Payer: Self-pay | Admitting: Primary Care

## 2017-09-06 ENCOUNTER — Ambulatory Visit: Payer: MEDICAID | Attending: Primary Care | Admitting: Primary Care

## 2017-09-06 ENCOUNTER — Encounter: Payer: Self-pay | Admitting: Primary Care

## 2017-09-06 ENCOUNTER — Emergency Department: Admission: EM | Admit: 2017-09-06 | Discharge status: Against Medical Advice | Payer: MEDICAID

## 2017-09-06 ENCOUNTER — Telehealth: Payer: Self-pay | Admitting: Primary Care

## 2017-09-06 VITALS — BP 136/72 | HR 104 | Ht 74.02 in | Wt 172.4 lb

## 2017-09-06 DIAGNOSIS — K5190 Ulcerative colitis, unspecified, without complications: Secondary | ICD-10-CM

## 2017-09-06 DIAGNOSIS — D5700 Hb-SS disease with crisis, unspecified: Secondary | ICD-10-CM | POA: Insufficient documentation

## 2017-09-06 DIAGNOSIS — D564 Hereditary persistence of fetal hemoglobin [HPFH]: Secondary | ICD-10-CM

## 2017-09-06 LAB — CBC AND DIFFERENTIAL
Baso # K/uL: 0 10*3/uL (ref 0.0–0.1)
Basophil %: 0.1 %
Eos # K/uL: 0.4 10*3/uL (ref 0.0–0.5)
Eosinophil %: 4.7 %
Hematocrit: 17 % — CL (ref 40–51)
Hemoglobin: 4.6 g/dL — ABNORMAL LOW (ref 13.7–17.5)
IMM Granulocytes #: 0.1 10*3/uL (ref 0.0–0.1)
IMM Granulocytes: 0.7 %
Lymph # K/uL: 1.3 10*3/uL (ref 1.3–3.6)
Lymphocyte %: 16.4 %
MCH: 18 pg/cell — ABNORMAL LOW (ref 26–32)
MCHC: 27 g/dL — ABNORMAL LOW (ref 32–37)
MCV: 65 fL — ABNORMAL LOW (ref 79–92)
Mono # K/uL: 1 10*3/uL — ABNORMAL HIGH (ref 0.3–0.8)
Monocyte %: 12.1 %
Neut # K/uL: 5.3 10*3/uL (ref 1.8–5.4)
Nucl RBC # K/uL: 0 10*3/uL (ref 0.0–0.0)
Nucl RBC %: 0 /100 WBC (ref 0.0–0.2)
Platelets: 249 10*3/uL (ref 150–330)
RBC: 2.6 MIL/uL — ABNORMAL LOW (ref 4.6–6.1)
RDW: 28.5 % — ABNORMAL HIGH (ref 11.6–14.4)
Seg Neut %: 66 %
WBC: 8 10*3/uL (ref 4.2–9.1)

## 2017-09-06 LAB — COMPREHENSIVE METABOLIC PANEL
ALT: 51 U/L — ABNORMAL HIGH (ref 0–50)
AST: 49 U/L (ref 0–50)
Albumin: 3.5 g/dL (ref 3.5–5.2)
Alk Phos: 201 U/L — ABNORMAL HIGH (ref 40–130)
Anion Gap: 14 (ref 7–16)
Bilirubin,Total: 1.3 mg/dL — ABNORMAL HIGH (ref 0.0–1.2)
CO2: 24 mmol/L (ref 20–28)
Calcium: 8.4 mg/dL — ABNORMAL LOW (ref 9.3–10.5)
Chloride: 97 mmol/L (ref 96–108)
Creatinine: 0.84 mg/dL (ref 0.50–1.00)
GFR,Black: 145 *
GFR,Caucasian: 126 *
Glucose: 66 mg/dL (ref 60–99)
Lab: 6 mg/dL (ref 6–20)
Potassium: 4 mmol/L (ref 3.3–5.1)
Sodium: 135 mmol/L (ref 133–145)
Total Protein: 8.5 g/dL — ABNORMAL HIGH (ref 6.3–7.7)

## 2017-09-06 LAB — RBC MORPHOLOGY

## 2017-09-06 LAB — RETICULOCYTES
Retic %: 6.1 % — ABNORMAL HIGH (ref 0.7–2.3)
Retic Abs: 158 10*3/uL — ABNORMAL HIGH (ref 33.8–124.0)

## 2017-09-06 LAB — TYPE AND SCREEN
ABO RH Blood Type: O POS
Antibody Screen: POSITIVE

## 2017-09-06 MED ORDER — FOLIC ACID 1 MG PO TABS *I*
1.0000 mg | ORAL_TABLET | Freq: Every day | ORAL | 5 refills | Status: DC
Start: 2017-09-06 — End: 2017-09-10

## 2017-09-06 NOTE — Telephone Encounter (Addendum)
Called patient with results.     HCT 17 and (reassuringly compared to his most recent labs) his retic count is higher than he is before.   Only symptoms currently are his worsened back pain and episode of light headedness.     Given that he has had ongoing GI blood losses from colitis and should be transfused given his HCT well below his previous baseline of high 20s-30.     In light of this, would recommend optimization in the hospital for blood as well as GI evaluation and management of poorly controlled colitis.     [facility].     Tamala SerFrancis W Lafreda Casebeer, MD

## 2017-09-06 NOTE — ED Notes (Signed)
09/06/17 1723   Expected Call-In Information   [facility]   PCP/Service Referral Dr. Beaulah Corinoyne   Pt Info note/Reason for sending Pt with hx of SCD, recent dx of ulcerative colitis, has been having GIB, still passing bloody stools, found to have Hct 17.   Pt Coming from Home   Requested Evaluation By Adult ED   Notify GI consult   Does referring physician have admitting privileges? No   Call reported to call in note

## 2017-09-06 NOTE — Progress Notes (Signed)
[facility]  [address]  [place] [id number]  Phone: [phone]  Fax: [phone]    REASON FOR VISIT     Sickle Cell VOC    PRIMARY DIAGNOSIS   Sickle Cell disease with PFH    SUBJECTIVE     VOC    Had increased pain starting yesterday morning.    Had an acute worsening of back pain, which had been mild for the past few days.   Pain stays steady, worsens somewhat, throughout the day with increased activity.    Had to leave work yesterday due to this current crisis.     Yesterday, had an episode of orthostasis where he felt unsteady upon standing up. Otherwise no light headedness.    No dyspnea, wheeze, DOE, chest pain.    Has been staying well hydrated. Urine has been dark.    Has been moving bowels, goes approximately three times per day, watery.    Has blood in stools "not that much" ~2x per day. Stains water red.    Continues with mesalamine 3 tabs every four hours.    Misses doses occasionally when he is very busy.   Has been taking iron.     Notes abdominal pain only prior to needing to use the bathroom.    Pain is reasonably controlled with oxycodone 10mg  po q4h prn, notes he takes approximately three times daily with acceptable pain control.     Current home management:  Non pharmacologic interventions: rest   Short acting Opioid pain medications: oxycodone 10 mg Q q hr prn  Long acting Opioid pain medications: none    Current Pain score:   Pain    09/06/17 0935   PainSc:   7   PainLoc: Back     Emergency Room visits for this event: 0  Hospitalizations for this event: 0    Current disease modifying therapy: none, persistently high hemoglobin F    Medications Reviewed and changes     Current Outpatient Prescriptions:     oxyCODONE (ROXICODONE) 10 MG immediate release tablet, Take 1 tablet (10 mg total) by mouth every 4 hours as needed for Pain.  Max daily dose: 6 tablets, Disp: 40 tablet, Rfl: 0    ferrous sulfate 325 (65 FE) MG tablet, Take 1 tablet (325 mg  total) by mouth 2 times daily (with meals), Disp: 100 tablet, Rfl: 3    mesalamine (ASACOL HD) 800 MG tablet, Take 3 tablets (2,400 mg total) by mouth 2 times daily, Disp: 180 tablet, Rfl: 2    clindamycin-benzoyl peroxide (BENZACLIN) gel, Apply topically 2 times daily, Disp: 25 g, Rfl: 5    naloxone (NARCAN) 4 mg/0.1 mL nasal spray, Instill 1 spray in 1 nostril once for opioid reversal. Repeat in alternating nostrils with new package every 2-3 minutes until response, Disp: 2 each, Rfl: 5    Electronic Thermometer (DIGITAL THERMOMETER) MISC, Use as directed, Disp: 1 each, Rfl: 0  Review of Systems   Constitutional: Negative for chills and fever.   Respiratory: Negative for shortness of breath.    Cardiovascular: Negative for chest pain.   Gastrointestinal: Positive for abdominal pain, blood in stool and diarrhea (loose stool).   Neurological: Positive for light-headedness (on one occasion, per HPI).     OBJECTIVE     BP 136/72    Pulse 104    Ht 1.88 m (6' 2.02")    Wt 78.2 kg (172 lb 6.4 oz)    SpO2 100%  BMI 22.13 kg/m   Physical Exam  Gen: Sitting in chair, in no distress.   HEENT: NCAT, MMM, anicteric sclerae.   CV: RRR, normal s1s2, no m/g/r  Resp: Unlabored respirations, lungs CTAB, no w/r/c  GI: flat, soft, nontender.   Skin: Warm and dry. No rashes.   Extrem: WWP  Psych: somewhat flat affect, more engaged throughout the interview.          Lab results: 08/31/17  0838   WBC 4.3   Hemoglobin 3.8*   Hematocrit 14*   RBC 2.5*   Platelets 262     ASSESSMENT / DIAGNOSIS     Cameron [name] is a 21 year old man with a history of SCD w/ HPFH as well as recent diagnosis of UC this past may via biopsy who comes in today with worsening back pain and ongoing hematochezia. He has had ongoing blood losses and recent decline of Hb/HCT from baseline requiring multiple transfusions. Now he is currently clinically stable, but given his worsening back pain and recent worsening of anemia, he warrants further laboratory  evaluation.     VOC    Continue oxycodone at current dose as this has been working.   Check CBCd, retic, CMP and type and screen (in case transfusion is indicated).    Will call with results, normal or abnormal.    At this time feels as though he is well hydrated. Discussed the possibility of infusion center utilization with him, to use in the future if he feels dehydrated.      UC    Encouraged he see GI, as scheduled.   Ongoing blood loss is concerning in the context of SCD, as above, especially in light of recent decline in HCT.    Continue mesalamine.    Encouraged to call should he have worsening abdominal pain or bloody diarrhea.      SCD    Discussed at length the importance of folic acid, especially in the context of worsened anemia. Cameron [name] is amenable to starting 1mg  of folic acid daily again. Has had a history of folate deficiency.    Continue supplemental iron given concomitant IDA.    Cameron [name] was seen today for sickle cell pain crisis.    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  --Patient instructed to call if symptoms are not improving or worsening    Electronically signed by Tamala SerFrancis W Nayel Purdy, MD 09/06/2017   9:57 AM  [facility], Phone: [phone]

## 2017-09-08 ENCOUNTER — Other Ambulatory Visit: Payer: Self-pay | Admitting: Cardiology

## 2017-09-08 ENCOUNTER — Inpatient Hospital Stay
Admission: EM | Admit: 2017-09-08 | Discharge: 2017-09-09 | DRG: 245 | Discharge status: Against Medical Advice | Payer: MEDICAID | Source: Ambulatory Visit | Attending: Internal Medicine | Admitting: Internal Medicine

## 2017-09-08 ENCOUNTER — Encounter: Payer: Self-pay | Admitting: Student in an Organized Health Care Education/Training Program

## 2017-09-08 DIAGNOSIS — K6289 Other specified diseases of anus and rectum: Secondary | ICD-10-CM

## 2017-09-08 DIAGNOSIS — K922 Gastrointestinal hemorrhage, unspecified: Secondary | ICD-10-CM

## 2017-09-08 DIAGNOSIS — K51911 Ulcerative colitis, unspecified with rectal bleeding: Principal | ICD-10-CM | POA: Diagnosis present

## 2017-09-08 DIAGNOSIS — R000 Tachycardia, unspecified: Secondary | ICD-10-CM

## 2017-09-08 DIAGNOSIS — D649 Anemia, unspecified: Secondary | ICD-10-CM

## 2017-09-08 DIAGNOSIS — D62 Acute posthemorrhagic anemia: Secondary | ICD-10-CM | POA: Diagnosis present

## 2017-09-08 DIAGNOSIS — D571 Sickle-cell disease without crisis: Secondary | ICD-10-CM | POA: Diagnosis present

## 2017-09-08 DIAGNOSIS — K5190 Ulcerative colitis, unspecified, without complications: Secondary | ICD-10-CM

## 2017-09-08 LAB — CBC AND DIFFERENTIAL
Baso # K/uL: 0 10*3/uL (ref 0.0–0.1)
Basophil %: 0.1 %
Eos # K/uL: 0.3 10*3/uL (ref 0.0–0.5)
Eosinophil %: 3.4 %
Hematocrit: 18 % — CL (ref 40–51)
Hemoglobin: 4.9 g/dL — ABNORMAL LOW (ref 13.7–17.5)
IMM Granulocytes #: 0 10*3/uL (ref 0.0–0.1)
IMM Granulocytes: 0.4 %
Lymph # K/uL: 1.8 10*3/uL (ref 1.3–3.6)
Lymphocyte %: 22.2 %
MCH: 19 pg/cell — ABNORMAL LOW (ref 26–32)
MCHC: 28 g/dL — ABNORMAL LOW (ref 32–37)
MCV: 69 fL — ABNORMAL LOW (ref 79–92)
Mono # K/uL: 0.6 10*3/uL (ref 0.3–0.8)
Monocyte %: 7.3 %
Neut # K/uL: 5.5 10*3/uL — ABNORMAL HIGH (ref 1.8–5.4)
Nucl RBC # K/uL: 0 10*3/uL (ref 0.0–0.0)
Nucl RBC %: 0.2 /100 WBC (ref 0.0–0.2)
Platelets: 336 10*3/uL — ABNORMAL HIGH (ref 150–330)
RBC: 2.6 MIL/uL — ABNORMAL LOW (ref 4.6–6.1)
RDW: 34 % — ABNORMAL HIGH (ref 11.6–14.4)
Seg Neut %: 66.6 %
WBC: 8.2 10*3/uL (ref 4.2–9.1)

## 2017-09-08 LAB — PLASMA PROF 7 (ED ONLY)
Anion Gap,PL: 10 (ref 7–16)
CO2,Plasma: 25 mmol/L (ref 20–28)
Chloride,Plasma: 100 mmol/L (ref 96–108)
Creatinine: 0.86 mg/dL (ref 0.50–1.00)
GFR,Black: 144 *
GFR,Caucasian: 125 *
Glucose,Plasma: 102 mg/dL — ABNORMAL HIGH (ref 60–99)
Potassium,Plasma: 3.9 mmol/L (ref 3.4–4.7)
Sodium,Plasma: 135 mmol/L (ref 133–145)
UN,Plasma: 5 mg/dL — ABNORMAL LOW (ref 6–20)

## 2017-09-08 LAB — RETICULOCYTES
Retic %: 6.2 % — ABNORMAL HIGH (ref 0.7–2.3)
Retic Abs: 154.3 10*3/uL — ABNORMAL HIGH (ref 33.8–124.0)

## 2017-09-08 LAB — RBC MORPHOLOGY

## 2017-09-08 LAB — PROTIME-INR
INR: 1.5 — ABNORMAL HIGH (ref 0.9–1.1)
Protime: 16.9 s — ABNORMAL HIGH (ref 10.0–12.9)

## 2017-09-08 LAB — ANTIBODY SPECIFICITY

## 2017-09-08 LAB — HOLD SST

## 2017-09-08 LAB — CRP: CRP: 23 mg/L — ABNORMAL HIGH (ref 0–10)

## 2017-09-08 LAB — TYPE AND SCREEN
ABO RH Blood Type: O POS
Antibody Screen: POSITIVE

## 2017-09-08 MED ORDER — ENOXAPARIN SODIUM 40 MG/0.4ML IJ SOSY *I*
40.0000 mg | PREFILLED_SYRINGE | Freq: Every day | INTRAMUSCULAR | Status: DC
Start: 2017-09-08 — End: 2017-09-08

## 2017-09-08 MED ORDER — FERROUS SULFATE 325 (65 FE) MG PO TABS *WRAPPED* *I*
325.0000 mg | ORAL_TABLET | Freq: Two times a day (BID) | ORAL | Status: DC
Start: 2017-09-08 — End: 2017-09-09
  Administered 2017-09-08 – 2017-09-09 (×2): 325 mg via ORAL
  Filled 2017-09-08 (×3): qty 1

## 2017-09-08 MED ORDER — SODIUM CHLORIDE 0.9 % IV BOLUS *I*
1000.0000 mL | Freq: Once | Status: AC
Start: 2017-09-08 — End: 2017-09-08
  Administered 2017-09-08: 1000 mL via INTRAVENOUS

## 2017-09-08 MED ORDER — FOLIC ACID 1 MG PO TABS *I*
1.0000 mg | ORAL_TABLET | Freq: Every day | ORAL | Status: DC
Start: 2017-09-08 — End: 2017-09-09
  Administered 2017-09-08 – 2017-09-09 (×2): 1 mg via ORAL
  Filled 2017-09-08 (×2): qty 1

## 2017-09-08 MED ORDER — MESALAMINE 800 MG PO TBEC *I*
2400.0000 mg | DELAYED_RELEASE_TABLET | Freq: Two times a day (BID) | ORAL | Status: DC
Start: 2017-09-08 — End: 2017-09-09
  Administered 2017-09-08 – 2017-09-09 (×3): 2400 mg via ORAL
  Filled 2017-09-08 (×6): qty 3

## 2017-09-08 MED ORDER — SODIUM CHLORIDE 0.9 % IV SOLN WRAPPED *I*
3.0000 mL/h | Status: DC
Start: 2017-09-08 — End: 2017-09-09
  Administered 2017-09-09: 3 mL/h via INTRAVENOUS

## 2017-09-08 MED ORDER — PLASMA-LYTE IV SOLN *WRAPPED*
75.0000 mL/h | Status: DC
Start: 2017-09-08 — End: 2017-09-09
  Administered 2017-09-08 – 2017-09-09 (×3): 75 mL/h via INTRAVENOUS

## 2017-09-08 MED ORDER — OXYCODONE HCL 5 MG PO TABS *I*
10.0000 mg | ORAL_TABLET | ORAL | Status: DC | PRN
Start: 2017-09-08 — End: 2017-09-09
  Filled 2017-09-08: qty 1

## 2017-09-08 NOTE — Telephone Encounter (Addendum)
Called Cameron [name] to check in, as I noticed he didn't seek further care in the ED Friday evening.   He has been feeling more weak, breathing is comfortable. I did discuss my concerns about his low counts with ongoing GI blood losses.   He will go into the hospital for further evaluation. He asked that I provide him a letter for work.   Will route a letter to be faxed to [employer] [phone]) on Monday Morning.  Spoke with ED communication nurse to confirm that Cameron [name] will be coming in for further evaluation.     [facility] pool as well to check in on Cameron [name] in the AM.     Tamala SerFrancis W Hernandez [place], MD

## 2017-09-08 NOTE — ED Notes (Signed)
Blood bank called and informed writer that compatible blood will be sent after a 1-2 hour delay due to difficulties finding a compatible blood match. MD aware.

## 2017-09-08 NOTE — ED Triage Notes (Signed)
See call in note. Reports having streaks of blood in his stool. Complains of feeling fatigue and light headed. Denies pain.        Triage Note   Jobe GibbonKathryn A Arinze Rivadeneira, RN

## 2017-09-08 NOTE — ED Notes (Signed)
ED RN INTERN ATTESTATION       I Orson AloeMadeline Daymeon Fischman, RN (RN) reviewed the following charting information by the RN intern: E. Richards    Nursing Assessments  Medications  Plan of Care  Teaching   Notes    In the chart of Cameron [name] [age] y.o. male) and attest to the charting being accurate.

## 2017-09-08 NOTE — First Provider Contact (Signed)
ED First Provider Contact Note    Initial provider evaluation performed by   ED First Provider Contact     Date/Time Event User Comments    09/08/17 1526 ED First Provider Contact Cameron [name] Initial Face to Face Provider Contact      21 y/o [name] with SCD with recent diagnosis UC with GI bleeding - small amount seen in stool twice today. He has no pain. Pt tachycardic in triage, no SOB, CP  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         Vital signs reviewed.    Orders placed:  LABS     Patient requires further evaluation.     Trudee Gripana Asta Corbridge, NP, 09/08/2017, 3:26 PM     Trudee Griprew, Joci Dress, NP  09/08/17 1529

## 2017-09-08 NOTE — Consults (Addendum)
Division of Gastroenterology and Hepatology Initial Consult    Admit Date:  09/08/2017  Attending Provider:  Eustaquio Maize, MD                                  Primary Care Physician:  Corinne Ports, MD   Admitting Diagnosis: Acute anemia     Consult reason: Acute anemia, GI bleed     Subjective:  Chart reviewed. History obtained from patient and chart review    Cameron [name] is a 21 y.o. male with a medical history of sickle cell disease with hereditary persistence of fetal hemoglobin, and question of ulcerative colitis (had evidence of chronic active colitis on biopsies from colonoscopy in May 2018), who presents with acute anemia.     Patient was admitted in May 2018 for loose stools mixed w/ blood and abdominal pain - at that time he was noted to have NSAID use, and underwent EGD. EGD findings were were normal. He subsequently underwent colonoscopy for further evaluation - this showed mild edema/inflammation from the sigmoid colon to the cecum. Rectum and terminal ileum appeared normal. Biopsies of the colon revealed mildly active chronic colitis in the right, transverse, and left colons, with moderately active chronic colitis in the rectum. CRP at that time was elevated to 13. CT abd/pelvis showed no evidence of active GI bleeding or findings to suggest IBD. After biopsy results, he was planned to start on Asacol 4.8 g/day.     Patient reports his current episode of GI bleeding began approximately 2 weeks ago. He reports having 4 BMs/day - they are loose/watery stool mixed with small amounts of blood. He reports lower abdominal cramping associated with his BMs, but denies tenesmus. He denies any nausea/vomiting/hematemesis. He reports being able to tolerate PO intake without difficulty or worsening pain. He denies any fevers or chills. In regards to extraintestinal manifestations - he denies any joint pains, skin changes, oral ulcers/lesions, or ocular pain/redness. He reports having a similar  episode to this about 2-3 months ago, at which time he needed blood transfusion.     In regards to medication use - he reports adherence to Asacol 4.8g/day, but reports that he has been unable to take it for the last week due to an insurance issue. He denies ever having to use steroids, and has not been on any biologics.     The remainder of the GI review of systems is negative for jaundice, pruritus, scleral icterus, abdominal pain, bloating, or cramping, dysphagia (solids and liquids), odynophagia, heartburn, dyspepsia, nausea, vomiting, hematemesis, diarrhea, constipation, hematochezia, and melena.    Review of Systems  Constitutional: No unexplained weight loss, fevers, fatigue, generalized weakness, or loss of appetite  Eyes: No vision changes, eye pain, or conjunctival injection  Ears, nose, and throat: No epistaxis, gingival bleeding, or sore throat   Cardiovascular: No chest pain, palpitations, dyspnea on exertion, lower extremity edema, or syncope  Pulmonary: No sputum production, shortness of breath at rest, wheezing, or hemoptysis   Gastrointestinal: See HPI  Genitourinary: No dysuria or hematuria  Endocrine: No heat or cold intolerance or diaphoresis  Hematologic: No easy bruising or bleeding, anemia, thrombocytopenia, or lymphadenopathy  Musculoskeletal: No joint pain, stiffness, erythema, warmth, or swelling or myalgias  Integumentary: No rashes, lesions, or jaundice  Neurologic: No headaches, loss of extremity strength or sensation, or paresthesias  Psychiatric: No anxiety, depression, or insomnia  Medications:  Home Medications:  Prior to Admission medications    Medication Sig Start Date End Date Taking? Authorizing Provider   folic acid (FOLVITE) 1 MG tablet Take 1 tablet (1 mg total) by mouth daily 09/06/17   Anda Latina, MD   oxyCODONE (ROXICODONE) 10 MG immediate release tablet Take 1 tablet (10 mg total) by mouth every 4 hours as needed for Pain.  Max daily dose: 6 tablets 09/02/17    [name], Senaida Lange, MD   ferrous sulfate 325 (65 FE) MG tablet Take 1 tablet (325 mg total) by mouth 2 times daily (with meals) 08/26/17   Wyline Mood, MD   mesalamine (ASACOL HD) 800 MG tablet Take 3 tablets (2,400 mg total) by mouth 2 times daily 08/26/17   Wyline Mood, MD   clindamycin-benzoyl peroxide [facility]) gel Apply topically 2 times daily 07/02/17   [name], Senaida Lange, MD   naloxone [facility]) 4 mg/0.1 mL nasal spray Instill 1 spray in 1 nostril once for opioid reversal. Repeat in alternating nostrils with new package every 2-3 minutes until response 01/23/17   [name], Senaida Lange, MD   Electronic Thermometer (DIGITAL THERMOMETER) MISC Use as directed 09/07/16   Orlena Sheldon, MD     Scheduled Meds   sodium chloride 0.9 % bolus  1,000 mL Intravenous Once     Continuous Infusions   sodium chloride       PRN Meds      Allergies/Sensitivities:   Allergies as of 09/08/2017    (No Known Allergies (drug, envir, food or latex))       Past Medical Hx:   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       Past Surgical Hx:   Past Surgical History:   Procedure Laterality Date    CHOLECYSTECTOMY  2014       Social Hx:   reports that he has never smoked. He has never used smokeless tobacco.   reports that he does not drink alcohol.   reports that he does not use drugs.    Family Hx: family history includes Other in his mother; Sickle cell anemia in his brother, brother, and sister.    PHYSICAL EXAM:  Patient Vitals for the past 72 hrs:   BP Temp Temp src Pulse Resp SpO2 Height Weight   09/08/17 1817 109/54 36.9 C (98.4 F) TEMPORAL 97 18 100 % - -   09/08/17 1531 128/73 36.9 C (98.4 F) TEMPORAL (!) 139 18 100 % 188 cm ('6\' 2"' ) 78 kg (172 lb)     Wt Readings from Last 3 Encounters:   09/08/17 78 kg (172 lb)   09/06/17 78.2 kg (172 lb 6.4 oz)   08/26/17 80.1 kg (176 lb 8 oz)     No intake/output data recorded.    General appearance:  alert, appears stated age and cooperative, non-toxic on examination and in no distress   Eye: EOM, anicteric, no ocular erythema   Ears, Nose, Mouth and throat: MMM, clear o/p without ulcers/lesions  Cardiovascular: regular rate and rhythm, S1, S2 normal, no murmur, rubs, or gallop.  Respiratory: clear to auscultation bilaterally.  Gastrointestinal: +bowel sounds in all 4 quadrants. Abdomen soft, non-distended, easily compressible. NTTP, no rebound/guarding/rigidity. No palpable organomegaly. No fluid wave.   Skin: no lesions or jaundice  Neurological: grossly normal, no asterixis  Psychiatric: normal affect   Extremities: extremities warm and dry. No edema  LAB DATA:      Lab results: 09/08/17  1614 09/06/17  1614 08/31/17  0838 08/26/17  1613 07/02/17  1719   WBC 8.2 8.0 4.3 4.1* 6.0   Hemoglobin 4.9* 4.6* 3.8* 4.2* 3.8*   Hematocrit 18* 17* 14* 16* 14*   RBC 2.6* 2.6* 2.5* 2.7* 2.5*   Platelets 336* 249 262 323 318           Lab results: 09/08/17  1614 09/06/17  1614   Sodium  --  135   Potassium  --  4.0   Chloride  --  97   CO2  --  24   UN  --  6   Creatinine 0.86 0.84   GFR,Caucasian  --  126   GFR,Black  --  145   Glucose  --  66   Calcium  --  8.4*   Total Protein  --  8.5*   Albumin  --  3.5   ALT  --  51*   AST  --  49   Alk Phos  --  201*   Bilirubin,Total  --  1.3*           Lab results: 09/08/17  1614   INR 1.5*     IMAGING:  CT Abd/Pelvis (02/07/17):  FINDINGS ABDOMEN:  Chest Base: Diffuse decreased attenuation of the cardiac blood pool compatible with anemia.     Liver/Biliary Tract: No focal liver lesions. Status post cholecystectomy. Periportal edema. Focally dilated duct in segment 2.    Pancreas: Unremarkable.    Spleen: Borderline splenomegaly.    Adrenals: Unremarkable.    Kidneys and Collecting Systems: Unremarkable.    Lymph Nodes: There are several mildly prominent mesenteric lymph nodes. For example anterior mesenteric lymph node measures 1.0 cm on series 202 image 82.    Vessels:  Unremarkable.    Abdominal GI Tract/Mesentery and Peritoneal Cavity: No bowel wall thickening or abnormal distention. No free air or free fluid. Normal appendix.    Soft Tissues/Musculoskeletal: No acute skeletal disease.    FINDINGS PELVIS:    Prostate Glands/Seminal Vesicles: Unremarkable.    Bladder: Unremarkable.    Lymph Nodes: Unremarkable.    Vessels: Unremarkable.    Pelvic GI Tract/Mesentery and Peritoneal Cavity: No bowel wall thickening or abnormal distention. No free fluid in the pelvis. Normal appendix. No evidence of GI bleeding.    Soft Tissues/Musculoskeletal: No acute soft tissue or osseous abnormalities.    IMPRESSION:  1. No CT evidence of active GI bleeding. No findings to suggest inflammatory bowel disease.  2. Status post cholecystectomy. Focally dilated duct in segment 2 of the liver, new compared to the prior MRCP. Findings may relate to stricture. The extrahepatic duct is normal in caliber and note is made of normal serum bilirubin.  3. Decreased attenuation of the cardiac blood pool compatible with anemia.    GI PROCEDURES:  Colonoscopy (02/11/17):  Findings:  Anorectal:   Normal tone, no masses  Terminal Ileum:   Normal ileal mucosa  Cecum:   Mild edema, loss of vascular markings, and punctate areas of   white exudate.  Ascending Colon:   Mild edema, loss of vascular markings, and punctate areas of   white exudate.  Transverse Colon:   Mild edema and loss of vascular markings.  Descending Colon:   Mild edema and loss of vascular markings.  Sigmoid:   Mild edema and loss of vascular markings.    Rectum:   Normal rectum throughout  Small internal hemorrhoids      Intervention(s):    Biopsies taken throughout the colon.      Complication(s):   none    Impression(s):   Mild edema/inflammation from sigmoid to cecum. This is unlikely   the source of bleeding   Rectum and terminal ileum appeared normal.    Recommendation(s):   Await pathology.   Resume diet   He should  f/u in GI clinic in a few months as outpatient. We   will arrange. CBC should be followed by his primary doctor.     FINAL DIAGNOSIS:   (A) Colon, right, biopsy:   - Chronic colitis, mildly active.   - No dysplasia is seen.     (B) Colon, transverse, biopsy:   - Chronic colitis, mildly active.   - No dysplasia is seen.     (C) Colon, left, biopsy:   - Chronic colitis, mildly active.   - No dysplasia is seen.     (B) Rectum, biopsy:   - Chronic colitis, moderately active.   - No dysplasia is seen.     EGD (02/08/17):    Findings:   Esophagus:   Normal    Stomach:   Normal    Duodenum/small bowel:   Normal    Normal major ampulla     Intervention(s):   None    Complication(s):   none    Impression(s):  1. Normal examination   2. No ulcers, erosions or bleeding stigmata seen   3. Bile seen in the duodenum    Recommendation(s):   Follow-up with PCP    Monitor H/H and transfuse as needed.    Plan for colonoscopy on Monday. Prep on Sunday.    Ok to give clear liquid diet     Histopathologic Diagnosis:  none    Assessment/Recommendations  21 y.o. male with sickle cell disease and ulcerative colitis (appears to be pancolitis based on biopsy results from colonoscopy in 01/2017) who is presenting with persistent anemia, likely in the setting of both sickle cell disease and active ulcerative colitis. He has both signs of GI bleeding, and given elevated reticulocytes and and indirect bilirubin, likely has hemolysis from the SCD as well. In regards to his ulcerative colitis, CRP is notably elevated, though reassuringly, has an overall benign abdominal examination.     Recommendations:  -Recommend stool studies at this time, including stool bacterial and parasite PCRs, and Clostridium difficile   -Continue with Asacol 4.8g/daily, and recommend starting patient on Prednisone 40 mg QD   -For now, favor holding off on starting bowel prep for colonoscopy given significant anemia, and periods of tachycardia.  Ideally, would like him to be transfused before starting him on prep so as not to dehydrate him further   -Will discuss with consult attending in regards to timing of endoscopy   -Follow daily CBC, CMP, and CRPs   -Pain control as per primary team - ideally, we would want to hold off on opioids in an IBD patient, but his diagnosis with SCD makes this understandably difficult. Continue to avoid NSAIDs   -Patient should receive DVT ppx, as UC is a pro-inflammatory state    Appreciate the medicine service's assistance with this patient. We will continue to follow along.     Case to be discussed with consult attending.    Conan Bowens, MD  PGY-5, Gastroenterology & Hepatology Fellow    GI ATTENDING  I saw and evaluated the patient. I agree with the fellow's  findings and plan of care as documented above. 9M with pan-UC and sickle cell disease, presents with acute on chronic anemia in the setting of hematochezia X 2 weeks, typically nocturnal.Denies pain.  Anemia is likely combination of GI blood loss and SCD. Start prednisone in addition to home mesalamine, transfuse to Hct > 21 if able.  No plan for urgent endoscopic evaluation currently given degree of anemia, though will need to consider if hematochezia persists. Long-term, I anticipate he will need biologic/immunomodulator tx. He does not have an established GI physician, we can set him up in our fellows clinic upon hospital d/c.  Clerance Lav, MD

## 2017-09-08 NOTE — ED Notes (Signed)
Pt arrived to the ED stating "my blood count is low". Pt states that he had his blood drew this past Friday, PCP told him to come to the ED for eval. Pt denies N/V, pain, dizziness, blurred vision, no complaints at this time. Pt hx of sickle cell anemia. Pt in gown. Will continue to monitor and treat per provider orders.

## 2017-09-08 NOTE — ED Provider Notes (Addendum)
History     Chief Complaint   Patient presents with    Abnormal Lab     Cameron [name] is a 21 y.o. male with past medical history notable for sickle cell disease, recent diagnosis of ulcerative colitis (in May 2018 via biopsy) who presents with GI bleeding. He was seen by his PCP Dr. Beaulah Corinoyne on 09/06/17 with bloody stools, Hct was noted to be 17 which is actually higher than previous but much lower than his prior baseline of high 20s-30s. He was called in to the ED by his PCP Dr. Beaulah Corinoyne for optimization in the hospital for blood transfusion and GI evaluation and management of poorly controlled UC, however he did not end up coming until today. He does not currently have a GI doctor.    Patient continues to have occasional bloody bowel movements, approximately 2 times/day with small amount of dark red stool and associated pain with defecation. Denies fevers, chills, chest pain, shortness of breath, headache, dizziness, nausea, vision changes, dysuria, hematuria. He reports intermittent abdominal pain since being diagnosed with colitis and recently has been having occasional lower back pain that is non-radiating and resolves with oxycodone. No obvious exacerbating factors. Neither abdominal nor back pain are present at time of interview.        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    Patient Active Problem List   Diagnosis Code    Sickle cell disease with HPFH D57.1, D56.4    Back pain M54.9    Anemia D64.9    History of esophagogastroduodenoscopy (EGD) Z98.890    S/P colonoscopy Z98.890    Ulcerative colitis K51.90      Past Surgical History:   Procedure Laterality Date    CHOLECYSTECTOMY  2014     Family History   Problem Relation Age of Onset    Other Mother     Sickle cell anemia Sister     Sickle cell anemia Brother     Sickle cell anemia Brother     Social History   Substance Use Topics     Smoking status: Never Smoker    Smokeless tobacco: Never Used    Alcohol use No     Living Situation     Questions Responses    Patient lives with Family    Homeless No    Caregiver for other family member     External Services     Employment     Domestic Violence Risk            Review of Systems   Review of Systems   Constitutional: Negative for appetite change, chills and fever.   HENT: Negative for congestion, nosebleeds, rhinorrhea and sore throat.         No gum bleeding   Respiratory: Negative for cough, chest tightness and shortness of breath.    Cardiovascular: Negative for chest pain, palpitations and leg swelling.   Gastrointestinal: Positive for abdominal pain, blood in stool and rectal pain. Negative for constipation, diarrhea, nausea and vomiting.   Genitourinary: Negative for dysuria, frequency and hematuria.   Musculoskeletal: Positive for back pain. Negative for arthralgias.   Skin: Negative for rash.   Neurological: Negative for dizziness, weakness, light-headedness and headaches.   Psychiatric/Behavioral: Negative for confusion.     Physical Exam     Triage Vitals  Triage Start: Start, (09/08/17 1526)  First Recorded BP: 128/73, Resp: 18, Temp: 36.9 C (98.4 F), Temp src: TEMPORAL Oxygen Therapy SpO2: 100 %, O2 Device: None (Room air), Heart Rate: (!) 139, (09/08/17 1531)      First Pain Reported  0-10 Scale: 0, (09/08/17 1531)     Physical Exam   Constitutional: He is oriented to person, place, and time. He appears well-developed and well-nourished. No distress.   HENT:   Head: Normocephalic and atraumatic.   Eyes: Pupils are equal, round, and reactive to light. EOM are normal.   Cardiovascular: Regular rhythm and normal heart sounds.  Tachycardia present.  Exam reveals no gallop and no friction rub.    No murmur heard.  Pulmonary/Chest: Effort normal and breath sounds normal. No respiratory distress. He has no wheezes. He has no rales.   Abdominal: Soft. Bowel sounds are normal. He exhibits  no distension. There is no tenderness. There is no guarding.   Musculoskeletal: Normal range of motion. He exhibits no edema or deformity.   Neurological: He is alert and oriented to person, place, and time. He exhibits normal muscle tone.   Skin: Skin is warm and dry. Capillary refill takes less than 2 seconds. He is not diaphoretic.   Psychiatric: He has a normal mood and affect.   Nursing note and vitals reviewed.    Medical Decision Making      Initial Evaluation:  ED First Provider Contact     Date/Time Event User Comments    09/08/17 1526 ED First Provider Contact DREW, DANA M Initial Face to Face Provider Contact        Patient seen by me on 09/08/2017.    Assessment: 21 y.o.male with sickle cell disease comes to the ED with low Hct in setting of ongoing GI bleed likely 2/2 recent diagnosis of ulcerative colitis. Tachycardic, other VSS. NAD, no pain or other symptoms at present.    Differential diagnosis includes:  Ulcerative colitis, Crohn's disease, GI bleed, diverticulitis, anal fissure, splenic sequestration, anemia    Plan:  - CBC w/ diff + reticulocytes  - Plasma 7  - PT/INR  - Stool studies (bacteria, parasite, C. Diff)  - Type & screen  - IVF  - Consent for blood -> transfuse 1 unit pRBC  - GI consult    -------------------------------------------------------------------------    ED course:  - CBC: H/H 4.9/18 (from 4.6/17 on 12/14)  - Reticulocytes 154.3/6.2% (158.0/6.1% on 09/06/17)  - Plasma 7 unremarkable  - PT/INR 16.9/1.5  - CRP 23    Disposition:  - Admit to Hospital Medicine for further management of anemia and ulcerative colitis    Hewitt ShortsKaren Ren, MD  Anesthesiology, PGY-1  [facility] [phone]    09/08/17 at 4:13 PM    Resident Attestation:    Patient seen by me on 09/08/2017.    History:  I reviewed this patient, reviewed the resident's note and agree.    Exam:  I examined this patient, reviewed the resident's note and agree.    Decision Making:  I discussed with the resident his/her documented decision  making and agree.      Author:  Levora AngelLee Malcolm Saloni Lablanc, MD       Hewitt Shortsen, Karen, MD  Resident  09/08/17 1925       Dawna PartMarks, Corie ChiquitoLee Malcolm, MD  09/08/17 [phone]

## 2017-09-08 NOTE — ED Notes (Signed)
09/08/17 1324   Expected Call-In Information   [facility]   PCP/Service Referral complex care    Pt Info note/Reason for sending hct 17 has hx of colitis, sickel cell and anemia    Pt Coming from Home   Call reported to call in note

## 2017-09-08 NOTE — Progress Notes (Signed)
09/08/17 2000   UM Patient Class Review   Patient Class Review Inpatient   Level of Care Effective 09/08/2017.  Ariyan Sinnett RN, 01-1325

## 2017-09-08 NOTE — H&P (Addendum)
Hospital Medicine Admission Note      Chief Complaint:     GIB    History of Present Illness:   Cameron [name] is a 21 y.o. male with PMH of sickle cell disease with PFH, recent diagnosis of UC presenting with hematochezia.    Reports he has had bright red blood mixed with his stools for the past 2 weeks. He has about two episodes a day. Denies any abdominal pain but has some diarrhea (3BMs daily). No nausea, vomiting, melena, dysphagia, fevers, chills. He does not endorse lightheadedness, chest pain or dizziness. No change in appetite or weight loss.    He was recently found to have chronic active colitis in May on biopsies taken from colonoscopy performed for loose stools mixed with blood. He has been on asacol and reports compliance with this medication    In the ED, he was noted to be tachycardic which responded well with IVF. Otherwise, AF and HDS. He received 1L NS and GI was consulted.         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   HENT: Negative for congestion and nosebleeds.    Eyes: Negative for blurred vision and double vision.   Respiratory: Negative for cough, hemoptysis and shortness of breath.    Cardiovascular: Negative for chest pain and palpitations.   Gastrointestinal: Positive for blood in stool. Negative for abdominal pain, melena, nausea and vomiting.   Genitourinary:  Negative for dysuria and hematuria.   Skin: Negative for rash.   Neurological: Negative for dizziness and headaches.   Psychiatric/Behavioral: Negative for substance abuse.       Home Medications:     Prior to Admission medications    Medication Sig Start Date End Date Taking? Authorizing Provider   folic acid (FOLVITE) 1 MG tablet Take 1 tablet (1 mg total) by mouth daily 09/06/17   Anda Latina, MD   oxyCODONE (ROXICODONE) 10 MG immediate release tablet Take 1 tablet (10 mg total) by mouth every 4 hours as needed for Pain.  Max daily dose: 6 tablets 09/02/17   [name], Senaida Lange, MD   ferrous sulfate 325 (65 FE) MG tablet Take 1 tablet (325 mg total) by mouth 2 times daily (with meals) 08/26/17   Wyline Mood, MD   mesalamine (ASACOL HD) 800 MG tablet Take 3 tablets (2,400 mg total) by mouth 2 times daily 08/26/17   Wyline Mood, MD   clindamycin-benzoyl peroxide [facility]) gel Apply topically 2 times daily 07/02/17   [name], Senaida Lange, MD  naloxone (NARCAN) 4 mg/0.1 mL nasal spray Instill 1 spray in 1 nostril once for opioid reversal. Repeat in alternating nostrils with new package every 2-3 minutes until response 01/23/17   [name], Senaida Lange, MD   Electronic Thermometer (DIGITAL THERMOMETER) MISC Use as directed 09/07/16   Orlena Sheldon, MD        Physical Exam:       Intake/Output Summary (Last 24 hours) at 09/08/17 2052  Last data filed at 09/08/17 2034   Gross per 24 hour   Intake             1000 ml   Output                0 ml   Net             1000 ml     Vitals:    09/08/17 1817   BP: 109/54   Pulse: 97   Resp: 18   Temp: 36.9 C (98.4 F)   Weight:    Height:      BP: (109-128)/(54-73)   Temp:  [36.9 C (98.4 F)]   Temp src: Temporal (12/16 1817)  Heart Rate:  [97-139]   Resp:  [18]   SpO2:  [100 %]   Height:  [188 cm ('6\' 2"' )]   Weight:  [78 kg (172 lb)]     General: Pleasant. Alert/interactive. NAD. Wearing pink headphones  HEENT: MMM, anicteric, oropharynx  benign  Neck: No LAD. No JVD.  Cor: RRR, no M/R/G  Pulm: Normal WOB, CTA-B  Abd: Normal appearing, ND, soft, NTTP, no organomegaly or masses  Ext: WWP, 2+ peripheral pulses, no edema  Neuro: A&O to conversation, face symmetric, speech/language WNL, moving all extremities  Skin: Benign, no rashes, ecchymoses, ulcers    Recent studies:   Personally reviewed and notable for:  Lab:   - BMP - Na 135 K 3.9, Cr 0.86, ALT 51, AST 49, ALP 201, Tbili 1.3   - CBC - WBC 8.2, H/H 4.9/18, Plts 336   - Retic 6.2%   - CRP 23      EKG/Telemetry:     Sinus tachy with LVH        Assessment:   This is a 21 y.o. male with PMH of SS and UC presenting with asymptomatic anemia 2/2 GIB. Source of bleed likely 2/2 chronic colitis. H/H 4.9/18, however patient remains asymptomatic and HDS.     Plans:   Admit to Hospital Medicine    Anemia -  2/2 GIB from colitis and some degree of hemolysis from SS dz. Currently HDS and asymptomatic despite H/H 4.9/18. Has been lower in the past, however will need transfusion as soon as possible given SS disease.   - Retic count appropriately elevated at 6.2%. T bili mildly elevated.  - GI consulted, appreciate assistance with difficult case.  - Stool studies pending   - 2 large bore IVs  - T&S - discussed with blood bank - patient alloimmunized to current supply of blood products. Nearest available donor blood is in [place] at this time but will discuss with [facility] for closer available donors in the am.   - Will follow CBCs daily at this time given patient stability and limit number of blood draws.  - CLD for now pending ?possible endoscopy in the future.    Ulcerative colitis - Newly diagnosed in 01/2017  - Continue asacol 4.32m/daily per GI  - ESR/CRP elevated - Continue to trend CRP  daily.  - CMP daily.    Sickle cell disease - No concern for sickle cell crisis at this time.   - Continue home iron and folate supplementation   - Oxycodone for pain management   - Will continue IVF for  now      F: plasmalyte 75cc/hr   E: CMP daily  N: CLD      DVT PPX: Would ideally like to start DVT ppx given history of UC, however with no available blood products and active GIB, will hold for now. If blood products available tomorrow am would start lovenox.  Bowel Regimen: none  Code Status: Full Code  Disposition: Admit to medicine    Wonda Olds, MD  Internal Medicine PGY2  09/08/17, 8:52 PM

## 2017-09-09 DIAGNOSIS — K51011 Ulcerative (chronic) pancolitis with rectal bleeding: Secondary | ICD-10-CM

## 2017-09-09 DIAGNOSIS — D62 Acute posthemorrhagic anemia: Secondary | ICD-10-CM

## 2017-09-09 DIAGNOSIS — D500 Iron deficiency anemia secondary to blood loss (chronic): Secondary | ICD-10-CM

## 2017-09-09 LAB — EKG 12-LEAD
P: 59 deg
PR: 164 ms
QRS: 42 deg
QRSD: 86 ms
QT: 304 ms
QTc: 421 ms
Rate: 115 {beats}/min
Severity: ABNORMAL
T: 26 deg

## 2017-09-09 LAB — CBC
Hematocrit: 19 % — CL (ref 40–51)
Hemoglobin: 5.4 g/dL — ABNORMAL LOW (ref 13.7–17.5)
MCH: 20 pg/cell — ABNORMAL LOW (ref 26–32)
MCHC: 28 g/dL — ABNORMAL LOW (ref 32–37)
MCV: 69 fL — ABNORMAL LOW (ref 79–92)
Platelets: 319 10*3/uL (ref 150–330)
RBC: 2.8 MIL/uL — ABNORMAL LOW (ref 4.6–6.1)
RDW: 34.4 % — ABNORMAL HIGH (ref 11.6–14.4)
WBC: 7.6 10*3/uL (ref 4.2–9.1)

## 2017-09-09 LAB — COMPREHENSIVE METABOLIC PANEL
ALT: 32 U/L (ref 0–50)
AST: 24 U/L (ref 0–50)
Albumin: 3.2 g/dL — ABNORMAL LOW (ref 3.5–5.2)
Alk Phos: 155 U/L — ABNORMAL HIGH (ref 40–130)
Anion Gap: 8 (ref 7–16)
Bilirubin,Total: 1.5 mg/dL — ABNORMAL HIGH (ref 0.0–1.2)
CO2: 24 mmol/L (ref 20–28)
Calcium: 8.4 mg/dL — ABNORMAL LOW (ref 9.3–10.5)
Chloride: 105 mmol/L (ref 96–108)
Creatinine: 0.73 mg/dL (ref 0.50–1.00)
GFR,Black: 154 *
GFR,Caucasian: 133 *
Glucose: 84 mg/dL (ref 60–99)
Lab: 5 mg/dL — ABNORMAL LOW (ref 6–20)
Potassium: 4.1 mmol/L (ref 3.3–5.1)
Sodium: 137 mmol/L (ref 133–145)
Total Protein: 7.7 g/dL (ref 6.3–7.7)

## 2017-09-09 LAB — CBC AND DIFFERENTIAL
Baso # K/uL: 0 10*3/uL (ref 0.0–0.1)
Basophil %: 0.3 %
Eos # K/uL: 0.3 10*3/uL (ref 0.0–0.5)
Eosinophil %: 3.3 %
Hematocrit: 17 % — CL (ref 40–51)
Hemoglobin: 5 g/dL — ABNORMAL LOW (ref 13.7–17.5)
IMM Granulocytes #: 0 10*3/uL (ref 0.0–0.1)
IMM Granulocytes: 0.5 %
Lymph # K/uL: 1.6 10*3/uL (ref 1.3–3.6)
Lymphocyte %: 20 %
MCH: 20 pg/cell — ABNORMAL LOW (ref 26–32)
MCHC: 29 g/dL — ABNORMAL LOW (ref 32–37)
MCV: 70 fL — ABNORMAL LOW (ref 79–92)
Mono # K/uL: 0.5 10*3/uL (ref 0.3–0.8)
Monocyte %: 6.4 %
Neut # K/uL: 5.6 10*3/uL — ABNORMAL HIGH (ref 1.8–5.4)
Nucl RBC # K/uL: 0 10*3/uL (ref 0.0–0.0)
Nucl RBC %: 0 /100 WBC (ref 0.0–0.2)
Platelets: 287 10*3/uL (ref 150–330)
RBC: 2.5 MIL/uL — ABNORMAL LOW (ref 4.6–6.1)
RDW: 35 % — ABNORMAL HIGH (ref 11.6–14.4)
Seg Neut %: 69.5 %
WBC: 8 10*3/uL (ref 4.2–9.1)

## 2017-09-09 LAB — TIBC
Iron: 42 ug/dL — ABNORMAL LOW (ref 45–170)
TIBC: 302 ug/dL (ref 250–450)
Transferrin Saturation: 14 % — ABNORMAL LOW (ref 20–55)

## 2017-09-09 LAB — BB ANTIBODY REVIEW

## 2017-09-09 LAB — ANTIBODY INTERPRETATION

## 2017-09-09 LAB — CRP: CRP: 17 mg/L — ABNORMAL HIGH (ref 0–10)

## 2017-09-09 LAB — CLOSTRIDIUM DIFFICILE EIA: C difficile Toxins A and B EIA: 0

## 2017-09-09 LAB — FERRITIN: Ferritin: 26 ng/mL (ref 20–250)

## 2017-09-09 LAB — TRANSFERRIN: Transferrin: 209 mg/dL (ref 200–360)

## 2017-09-09 MED ORDER — SODIUM CHLORIDE 0.9 % IV SOLN WRAPPED *I*
200.0000 mg | INTRAVENOUS | Status: DC
Start: 2017-09-09 — End: 2017-09-09
  Administered 2017-09-09: 200 mg via INTRAVENOUS
  Filled 2017-09-09: qty 10

## 2017-09-09 MED ORDER — ANIMAL SHAPES WITH C & FA PO CHEW *WRAPPED*
1.0000 | CHEWABLE_TABLET | Freq: Every day | ORAL | Status: DC
Start: 2017-09-09 — End: 2017-09-09
  Administered 2017-09-09: 1 via ORAL
  Filled 2017-09-09: qty 1

## 2017-09-09 MED ORDER — SODIUM CHLORIDE 0.9 % IV SOLN WRAPPED *I*
3.0000 mL/h | Status: DC
Start: 2017-09-09 — End: 2017-09-09

## 2017-09-09 MED ORDER — PREDNISONE 20 MG PO TABS *I*
40.0000 mg | ORAL_TABLET | Freq: Every day | ORAL | Status: DC
Start: 2017-09-09 — End: 2017-09-09
  Administered 2017-09-09: 40 mg via ORAL
  Filled 2017-09-09: qty 2

## 2017-09-09 NOTE — ED Notes (Signed)
Report Given To  LaTanya, RN      Descriptive Sentence / Reason for Admission   Bloody bowel movements   Blood work done this past Friday, PCP called him today to let him know his HCT was low (17) and to come to the ED.     Hx: sickle disease       Active Issues / Relevant Events   Alert and oriented x4  Ambulatory   Hct-18  C reactive protein-23  Clear liquid diet  Full code  NKA  Plasmalyte @ 5275ml/hr        To Do List  VS  Assessments  Morning labs        Anticipatory Guidance / Discharge Planning  Admitted to medicine for GI hemorrhage, unspecified

## 2017-09-09 NOTE — ED Notes (Signed)
Blood transfusion started at this time at 70 mL/hr. Patient has two IV access and informed consent is in the patients chart. Will re assess and check patients vitals in 15 minutes.

## 2017-09-09 NOTE — Discharge Summary (Signed)
Name: Cameron [name] MRN: [id number] DOB: Mar 04, 1996     Admit Date: 09/08/2017   Date of Discharge: 09/09/2017     Patient was accepted for discharge to   Left against medical advice [7]           Discharge Attending Physician: Theotis Burrowsborne, Christine, MD      Hospitalization Summary    CONCISE NARRATIVE:   Mr. Cameron BachelorLucius Stennis [name] is a 21yo M w/SCD and newly diagnosed UC who presented w/hematochezia and was found to have a Hb of 4.9, consistent w/acute blood loss anemia c/b severe iron deficiency. Hemodynamically stable and asymptomatic except for hematochezia, was supported w/2u pRBCs. GI recommended steroids (40mg  prednisone daily) for likely active UC resulting in GIB, however, hematochezia ceased on its own prior to receiving any steroids.     Unfortunately, Mr. [name] decided to leave AMA for an unclear reason, despite profound anemia and despite significant attempt at making his stay more tolerable. I reinforced at length the importance of making it to his GI appointment the following day, to determine dosing and duration of steroid course, as well as FU labwork (Hb). I also reinforced that the hospital will still be here for him should he quickly worsen.                            Signed: Viviano SimasMatthew Kihanna Kamiya, MD  On: 09/09/2017  at: 6:54 PM

## 2017-09-09 NOTE — Progress Notes (Addendum)
Hospital Medicine Daily Progress Note   Length of stay: 1 days    24-HOUR EVENTS:     Admitted for GIB, likely related to UC  SCC and alloimmunized to blood products    SUBJECTIVE:     No more bloody bowel movement. No abdominal pain, N/V, CP, SOA. Has brown BM.    Medications:  Scheduled Meds:   predniSONE  40 mg Oral Daily    iron sucrose (VENOFER) IV  200 mg Intravenous Q24H    ferrous sulfate  325 mg Oral BID WC    folic acid  1 mg Oral Daily    mesalamine  2,400 mg Oral BID     Continuous Infusions:   sodium chloride      sodium chloride 3 mL/hr (09/09/17 0253)    electrolyte-148 75 mL/hr (09/09/17 0253)     PRN Meds:oxyCODONE    Allergies:  No Known Allergies (drug, envir, food or latex)    OBJECTIVE/DATA:     Physical Exam  Temp:  [36 C (96.8 F)-37 C (98.6 F)] 37 C (98.6 F)  Heart Rate:  [80-139] 93  Resp:  [18-20] 18  BP: (105-128)/(51-73) 120/59    GEN: resting comfortably in bed, NAD.  SKIN: Warm and dry, no rash or ulcers. Non-diaphoretic.  HEENT: Anicteric, no conjunctival pallor, MMM, no oropharyngeal erythema or exudate.  CV: RRR, normal S1 and S2, no m/r/g, no JVD, peripheral pulses intact and symmetric.   PULM: Normal WOB, CTA bilaterally, no wheezes, rhonchi, or rales.   ABD: Normal BS, soft, non-tender, non-distended.   NEURO: Alert and oriented x 3, PERRL, EOMI, no facial asymmetry, normal movement in all extremities.  EXTREMITIES: WWP, no lower extremity edema.  LINES/DRAINS: 2 PIV on left    Intake/Output    Intake/Output Summary (Last 24 hours) at 09/09/17 1207  Last data filed at 09/09/17 1059   Gross per 24 hour   Intake          2126.75 ml   Output                0 ml   Net          2126.75 ml       Lab Results:    Recent Labs  Lab 09/09/17  0619 09/08/17  1614 09/06/17  1614   Sodium 137  --  135   Potassium 4.1  --  4.0   Chloride 105  --  97   CO2 24  --  24   UN 5*  --  6   Creatinine 0.73 0.86 0.84   Calcium 8.4*  --  8.4*   Albumin 3.2*  --  3.5   Total Protein 7.7  --   8.5*   Bilirubin,Total 1.5*  --  1.3*   Alk Phos 155*  --  201*   ALT 32  --  51*   AST 24  --  49   Glucose 84  --  66       Recent Labs  Lab 09/09/17  1022 09/09/17  0619 09/08/17  1614 09/06/17  1614   WBC 7.6 8.0 8.2 8.0   Hemoglobin 5.4* 5.0* 4.9* 4.6*   Hematocrit 19* 17* 18* 17*   Platelets 319 287 336* 249   Protime  --   --  16.9*  --    INR  --   --  1.5*  --      No results for input(s): UAPP, UCOL, UAGLU, KETONESU,  USG, UBLD, UAPH, UPRO, UNITR, ULEU, URBC, UWBC, UMUC, GLUCOSEUA, KETONESUA, SPECIFICGRAV, BLOODUA, PHUA, PROTEIN, NITRITEUA, LEUKESTERASE, GLUCOSESIE, KETONESIEM, PUSG, BLOODSIEMEN, PUAPH, PROTEINUA, PUNIT, LEUKOCYTESIE in the last 8760 hours.    Microbiology  CDiff negative  Shiga toxin, campylobacter, shigella, salmonella, cryptosporidium, giardia PCR pending  Entameoba histolytica pending    Radiology results   No results found.     ASSESSMENT:     This is a 21 y.o. male with PMH of SS and UC presenting with asymptomatic anemia 2/2 GIB. Source of bleed likely 2/2 chronic colitis. H/H 4.9/18, however patient remains asymptomatic and HDS.     PLAN:     Anemia -  2/2 GIB from colitis and some degree of hemolysis from SS dz. Currently HDS and asymptomatic despite H/H 4.9/18. Has been lower in the past, however will need transfusion as soon as possible given SS disease.   - Retic count appropriately elevated at 6.2%. T bili mildly elevated.  - GI consulted, appreciate assistance with difficult case.  - Stool studies pending . Negative CDiff  - iron studies ordered. Will start IV venofer 258m x 2 doses   - discussed with blood bank - patient alloimmunized to current supply of blood products. [place] [facility] for closer available donors if needed.  - s/p 1 U this morning and will administer another unit given H/H 5.4/19  - Will follow CBCs daily at this time given patient stability with H/H in the evenings.  -  Regular diet. Colonoscopy if noted more bloody bowel movements or significant decline in H/H    Ulcerative colitis - Newly diagnosed in 01/2017  - Continue asacol 4.882mdaily per GI  - ESR/CRP elevated   - Continue to trend CRP daily. 23 to 17  - CMP daily.  - start prednisone 4057maily per GI reccs  - nutrition consult    Sickle cell disease - No concern for sickle cell crisis at this time.   - Continue home folate supplementation. Hold home iron as we will be administering Venofer and blood products, as above. Can resume on discharge.   - Oxycodone 82m12mH PRN for pain management   - Will continue IVF for now    F: PO  E: CMP, CBC, and CRP daily, H/H in evening, vitals Q4H  N: Regular diet    DVT PPX: Would ideally like to start DVT ppx given history of UC, however with no available blood products and active GIB, will hold for now. Continue SCDs  Bowel Regimen: none  Code Status: Full Code    Disposition: pending stability. Possibly tomorrow. Will need work note and outpatient GI follow up.     Code Status: Full Code    NilbDerrek Monaco  Internal Medicine R1  09/09/17 12:07 PM  PIC [id number]

## 2017-09-09 NOTE — Progress Notes (Signed)
Patient left 01-1399 against medical advice at approximately 1715.     Writer explained to patient the importance of their hospitalization and repercussions of leaving the hospital against medical advice. Patient still insisted on leaving hospital.     Patient is competent in making health care decisions and is currently A&O x3. Quinn PlowmanNilbh Patel, MD notified at [phone]. Allena KatzPatel, MD came to floor at 1645 and discussed risks of leaving AMA. Dr. Sandy Salaamonahue then came to explain risks of leaving AMA to patient as well at 1700. Patient understands diagnosis and risks related to refusing treatment.       Patient's reason for leaving AMA: "I can't stay here another night"- patient will not state a specific reason why he wants to be discharged.     Patient signed SH [id number] MR form: yes.  White and yellow copy in chart: yes.  Pink copy given to patient: yes.   Patient's PIV removed prior to leaving.      Eustaquio Maizeanielle Khamryn Calderone, RN

## 2017-09-09 NOTE — ED Notes (Signed)
Report Given To  Marchelle FolksAmanda, RN      Descriptive Sentence / Reason for Admission   Bloody bowel movements   Blood work done this past Friday, PCP called him today to let him know his HCT was low (17) and to come to the ED.     Hx: sickle disease       Active Issues / Relevant Events   Alert and oriented x4  FULL CODE  Ambulatory   Hct-18  C reactive protein-23  Clear liquid diet  NKA  Receiving 1 unit of PRBC           To Do List  VS  Assessments  Plasmalyte 75 mL/hr   Morning labs        Anticipatory Guidance / Discharge Planning  Admitted to 01-1399 for GI hemorrhage, unspecified

## 2017-09-09 NOTE — ED Notes (Signed)
Bed: AC-07L  Expected date:   Expected time:   Means of arrival:   Comments:  6L

## 2017-09-09 NOTE — Progress Notes (Signed)
Cameron BachelorLucius Kundinger [name] arrived on [phone] at [id number]. Arrived on bed.     Vital signs stable.  Pain currently 0/10.     No pain meds given.  Oriented to call bell, room, unit, visiting hours, IPC, and incentive spirometer.     Current activity listed as activity as tolerated; patient acknowledged understanding of current treatment plan.  Cameron [name] is currently resting with call bell in reach.    Wound: N/A.     Dressing Change order N/A,     Four eyed skin assessment completed with Merian Capronianna Kuszlyk. Assessment findings include:   no skin breakdown.     Interventions: N/A. WOCN consulted: no.   Q2 Hour Turning/Repositioning Needed:No . Low Air Loss Ordered: No. Patient incontinent of urine: No; patient incontinent of stool: No.      Discharge lounge process explained.     Joneen RoachAmanda Kye Hedden, RN as of 09/09/2017 at 5:24 AM

## 2017-09-09 NOTE — ED Notes (Signed)
Assumed care of patient. Pt resting comfortably in the lowest locked position with call bell within reach. No acute issues noted at this time. Will continue to monitor and treat per provider orders.

## 2017-09-09 NOTE — ED Notes (Signed)
Blood transfusion completed. No transfusion reaction. Pt tolerated well.

## 2017-09-09 NOTE — Plan of Care (Signed)
Cognitive function    • Cognitive function will be maintained or return to baseline Maintaining        Mobility    • Patient's functional status is maintained or improved Maintaining        Pain/Comfort    • Patient's pain or discomfort is manageable Maintaining        Psychosocial    • Demonstrates ability to cope with illness Maintaining        Safety    • Patient will remain free of falls Maintaining    • Prevent any intentional injury Maintaining          Nutrition    • Patient's nutritional status is maintained or improved Progressing towards goal

## 2017-09-09 NOTE — Consults (Signed)
Medical Nutrition Therapy - Initial Assessment    Admit Date: 09/08/2017    Reason for consult: Eval and Treat    Patient Summary: Per chart, this is a 21 y.o.malewith PMH of SS and UCpresenting with asymptomatic anemia 2/2 GIB. Source of bleed likely 2/2 chronic colitis. H/H 4.9/18, however patient remains asymptomatic and HDS.     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 Never smoker. No EtOH use.     Pertinent Meds: reviewed; includes - Ferrous Sulfate daily, Iron Sucrose, Mesalamine, Prednisone, IV NS 1L bolus given x1 yesterday, IV Plasmalyte now d/c'd     Pertinent Labs: reviewed    Na, K, and Cl are WNL   Corrected Ca 9.04 (L - w/ low Albumin of 3.2)   UN 5 (L)   BGs WNL   RBC 2.8 (L), Hgb 5.4 (L), Hct 19 (L)    Reviewed I/O's  BM 2x so far today    Access: PIV x2    Nutrition Hx: Spoke with pt this afternoon. He reports normal appetite and intakes at home PTA. Feels that he has been eating the same over the past year since his dx but has continued to lose weight with frequent BMs. Lives home with family. Will eat a breakfast sandwich with egg and bacon in the am, then reports 'normal' lunch and whatever dinner his mom eats for dinner. Eats all foods. Some snacking, states he'd often eat 3 apples a day. Drinks water and ginger ale.     Food allergies: NKFA    Current diet: Regular  Supplements: None    Nutrition Focused Physical Exam:  Edema: None noted (per RN shift assessment)  Abdomen: WDL (per RN shift assessment)  Skin: WDL (per RN shift assessment)  Body fat stores: Appear intact.  Lean body mass stores: Appear intact.     Anthropometrics:  Height: 188 cm (6\' 2" )    Current Weight: 78 kg (172 lb); 90% IBW; 89% UBW  UBW: 87.3 kg (192 lbs) - per pt   Ideal Body Weight: 86.4 kg + 10%  BMI: 22.1 kg/(m^2) - Normal  Range   Weight Hx: Based on review of wt chart, pt with wt loss of 5.2 kg (6.2%) x 2 months. Note overall loss of 14.6 kg (15.7%) x 16 mos.    WEIGHTS Weight (metric) Weight (standard) Weight Method   09/08/2017 78.019 kg 172 lbs Stated   09/06/2017 78.2 kg 172 lbs 6 oz --   08/26/2017 80.06 kg 176 lbs 8 oz --   07/02/2017 83.416 kg 183 lbs 14 oz --   04/30/2017 81.738 kg 180 lbs 3 oz --   03/29/2017 80.74 kg 178 lbs --   02/22/2017 81.1 kg 178 lbs 13 oz --   01/23/2017 86.32 kg 190 lbs 5 oz --   01/01/2017 86.32 kg 190 lbs 5 oz --   09/07/2016 84.8 kg 186 lbs 15 oz --   04/30/2016 92.8 kg 204 lbs 9 oz Actual       Estimated Nutrient Needs: (Based on stated 78 kg)    1950 - 2340 kcal/day (25 - 30 kcal/kg)   94 - 117 g protein/day (1.2 - 1.5 g/kg)    1950 - 2340 mL fluid/day (25 -  30 mL/kg)      Nutrition Assessment and Diagnosis:     Malnutrition Status: Pt does not meet criteria for malnutrition at this time. Pt at high risk d/t unintentional wt loss of 6.2% x 2 months.     Nutrition Dx: Altered GI fx related to UC as evidenced by pt presenting with GIB and unintentional wt loss.    Visited pt this afternoon (please see nutrition hx above). Pt reporting normal intakes despite wt loss which he attributes likely to high BM frequency. Pt states appetite is okay/normal at this time. Did not eat breakfast, had take out pizza for lunch. Soda (mountain dew) noted at bedside.     Reviewed dietary recommendations for UC. Recommended pt limit sources of insoluble fiber from fruits, vegetables and whole grains. Encouraged pt to choose well-cooked fruits/veggies with skin/seeds removed, refined grains, lean protein and low-fat dairy as tolerated. Also encouraged adequate hydration w/ 10 cups/of fluid/d (low sugar;non-caffeinated) as well as high protein intakes. Handout includes complete list of recommended foods and foods to avoid. Pt verbalized understanding and asked appropriate questions. Pt not accepting of oral nutrition supplements  at this time. Provided writer's contact information and encouraged him to f/u with any questions/concerns.    Chart reviewed. Meds and labs reviewed as above - notable for hypocalcemia; other lytes WNL (no PO4 or Mg to review); BGs WNL. Anemia noted. Pt taking Ferrous Sulfate and Iron Sucrose. Also note pt on Prednisone.      Nutrition Intervention:   1. Continue current diet order, consider low residue.    -Please encourage intakes of small frequent meals with protein choices at each meal / snack.   -Please encourage adequate hydration (low sugar / non-caffeinated)  2. Recommend ordering nursing communication for recording % of meals and supplements consumed in I/Os to continue to monitor intakes. (RN assistance appreciated)  3. If pt eating <75% of meals, would encourage pt to accept supplement, recommend Ensure High Protein TID.   4. Recommend daily Chewable MVI for additional support.  5. Please check actual weight and continue to weigh at least weekly to monitor trend.  6. Monitor BMP, PO4, and Mg - replete prn.    Nutrition Monitoring/Evaluation:   1. Will monitor diet tolerance and intake, nutrition-related labs, weight trend, BM pattern, supplement acceptance.   2. Nutrition to follow up per high nutrition risk protocol.    Forest GleasonBrittany Majesti Gambrell, RD  Clinical Dietitian   [facility] # [phone]

## 2017-09-09 NOTE — ED Notes (Signed)
Patient VSS and no transfusion reaction noted. Blood increased to 150 mL/hr. Will continue to monitor and assess.

## 2017-09-09 NOTE — Telephone Encounter (Signed)
Letter faxed to [employer]

## 2017-09-10 ENCOUNTER — Other Ambulatory Visit: Payer: Self-pay | Admitting: Primary Care

## 2017-09-10 ENCOUNTER — Telehealth: Payer: Self-pay | Admitting: Primary Care

## 2017-09-10 ENCOUNTER — Telehealth: Payer: Self-pay | Admitting: Social Worker

## 2017-09-10 ENCOUNTER — Ambulatory Visit
Admission: RE | Admit: 2017-09-10 | Discharge: 2017-09-10 | Disposition: A | Discharge status: Home / Self Care | Payer: MEDICAID | Source: Ambulatory Visit | Attending: Liver Pancreas GI Surgery | Admitting: Liver Pancreas GI Surgery

## 2017-09-10 ENCOUNTER — Encounter: Payer: Self-pay | Admitting: Student in an Organized Health Care Education/Training Program

## 2017-09-10 VITALS — BP 119/59 | HR 74 | Ht 74.0 in | Wt 174.0 lb

## 2017-09-10 DIAGNOSIS — K5190 Ulcerative colitis, unspecified, without complications: Secondary | ICD-10-CM

## 2017-09-10 DIAGNOSIS — K5100 Ulcerative (chronic) pancolitis without complications: Secondary | ICD-10-CM

## 2017-09-10 LAB — SHIGELLA PCR: Shigella PCR: 0

## 2017-09-10 LAB — CAMPYLOBACTER PCR: Campylobacter PCR: 0

## 2017-09-10 LAB — SHIGA TOXIN PCR: Shiga toxin PCR: 0

## 2017-09-10 LAB — GIARDIA PCR: Giardia PCR: 0

## 2017-09-10 LAB — RED BLOOD CELLS
Coded Blood type: 5100
Component blood type: O POS
Dispense status: TRANSFUSED

## 2017-09-10 LAB — CRYPTOSPORIDIUM PCR: Cryptosporidium PCR: 0

## 2017-09-10 LAB — ENTAMOEBA HISTOLYTICA PCR: E. histolytica PCR: 0

## 2017-09-10 LAB — SALMONELLA PCR: Salmonella PCR: 0

## 2017-09-10 MED ORDER — FOLIC ACID 1 MG PO TABS *I*
1.0000 mg | ORAL_TABLET | Freq: Every day | ORAL | 5 refills | Status: DC
Start: 2017-09-10 — End: 2017-10-11

## 2017-09-10 MED ORDER — OXYCODONE HCL 10 MG PO TABS *I*
ORAL_TABLET | ORAL | 0 refills | Status: DC
Start: 2017-09-10 — End: 2017-09-18

## 2017-09-10 NOTE — Telephone Encounter (Signed)
Please notify patient that refill completed to listed pharmacy

## 2017-09-10 NOTE — Telephone Encounter (Signed)
Cameron [name] will be added to [name]'s caseload. She will assist him with getting his Medicaid active.

## 2017-09-10 NOTE — Telephone Encounter (Signed)
Per patients mom, she has some concerns regarding patients stomach problems and his rectal bleeding.  Mom is requesting to speak to Dr. Concha NorwayPulcino directly but Clinical research associatewriter did notify mom that pt should fill out a release of information so our office can speak to mom freely.  Patients mom has asked Dr. Concha NorwayPulcino to call pt first for a verbal ok for Dr. Concha NorwayPulcino to speak with patients mother.    Patients mother French Anaracy [phone]  Patient  [phone]

## 2017-09-10 NOTE — Telephone Encounter (Signed)
Patient notified

## 2017-09-10 NOTE — Telephone Encounter (Signed)
Search Terms: Cameron [name], March 14, 1996   Search Date: 09/10/2017 08:44:19 AM   Searching on behalf of: [id number] - Tiffany L [name]   The Drug Utilization Report below displays all of the controlled substance prescriptions, if any, that your patient has filled in the last twelve months. The information displayed on this report is compiled from pharmacy submissions to the Department, and accurately reflects the information as submitted by the pharmacies.  This report was requested by: Tonny BollmanShandell Kian Gamarra   Reference #: [id number]   Others' Prescriptions  Patient Name: Cameron [name] Birth Date: March 14, 1996   Address[place] [address] [place], [place] [id number] Sex: Male   Rx Written Rx Dispensed Drug Quantity Days Supply Prescriber Name Payment Method Dispenser   09/02/2017 09/04/2017 oxycodone hcl 10 mg tablet  40 7 [name], [name] [facility]   08/26/2017 08/27/2017 oxycodone hcl 10 mg tablet  40 7 Karle BarrLie, Ariadne G MD [facility]   08/09/2017 08/13/2017 oxycodone hcl 10 mg tablet  40 7 [name], Gwenith Spitziffany L MD [facility]     Patient Name: Cameron [name] Birth Date: March 14, 1996   Address: [address] [place], [place] [id number] Sex: Male   Rx Written Rx Dispensed Drug Quantity Days Supply Prescriber Name Payment Method Dispenser   08/05/2017 08/06/2017 oxycodone hcl 10 mg tablet  40 7 [name], Gwenith Spitziffany L MD [facility]   07/02/2017 07/02/2017 oxycodone hcl 10 mg tablet  40 7 [name], Gwenith Spitziffany L MD [facility]   06/04/2017 06/04/2017 oxycodone hcl 10 mg tablet  40 7 [name], Gwenith Spitziffany L MD [facility]   04/30/2017 04/30/2017 oxycodone hcl 10 mg tablet  40 7 [name], Gwenith Spitziffany L MD [facility]   03/29/2017 03/29/2017 oxycodone hcl 10 mg tablet  40 7 [name], Gwenith Spitziffany L MD [facility]   02/22/2017 02/23/2017 oxycodone hcl 10 mg  tablet  40 7 [name], Gwenith Spitziffany L MD [facility]   01/23/2017 01/23/2017 oxycodone hcl 10 mg tablet  40 7 [name], Gwenith Spitziffany L MD [facility]   01/01/2017 01/02/2017 oxycodone hcl 10 mg tablet  14 14 [name], Gwenith Spitziffany L MD [facility]   12/04/2016 12/05/2016 oxycodone hcl 10 mg tablet  30 15 Diannia RuderNoronha, Suzie A (MD) [facility]   11/05/2016 11/06/2016 oxycodone hcl 10 mg tablet  30 15 Diannia RuderNoronha, Suzie A (MD) [facility]   10/02/2016 10/02/2016 oxycodone hcl 10 mg tablet  30 15 Vista DeckNoronha, Suzie A (MD) [facility]   * - Drugs marked with an asterisk are compound drugs. If the compound drug is made up of more than one controlled substance, then each controlled substance will be a separate row in the table.      Report Suspicious Activity   Send Questions/Comments   Substance Abuse Treatment Information     Click the Report Suspicious Activity button to report information related to controlled substance suspicious activity to the [employer] of [employer]otic Enforcement.   Click the Send Questions/Comments button to send questions about this report to the [facility] of Narcotic Enforcement, or call [phone].   Click the Substance Abuse Treatment Information button to go to the Office of Alcoholism and Substance Abuse Services website, www.[place]as.[employer].huny.gov or call (640) [phone].  2018 [facility]

## 2017-09-10 NOTE — Telephone Encounter (Signed)
Thank you :)

## 2017-09-10 NOTE — Telephone Encounter (Signed)
New triage from mother - I will call her and Alaa myself thank you

## 2017-09-10 NOTE — Telephone Encounter (Signed)
Patient left AMA - I am very very concerned about Cameron [name] - routing to CM to see if a home visit can be arranged to identify what is going on?  High concern for avoidance due to the overwhelming burden of managing two chronic illnesses at the same time.  Ideally he comes to see me today or tomorrow and I can initiate steroids as recommended by GI and he can be [facility] to do full assessment

## 2017-09-10 NOTE — Telephone Encounter (Signed)
See encounter from today.

## 2017-09-10 NOTE — Telephone Encounter (Signed)
Spoke with Cameron [name]  Pt reports feeling better after getting blood  Home currently, "doing okTeacher, adult education"  Writer asked pt for verbal consent to speak with his mother, consent given   Writer booked pt with Dr Beaulah Corinoyne for hospital follow up on Friday at 10:30    Writer spoke with pts mother Cameron [name]  Mom expressing some concern regarding pts overall state of health  She states pt left AMA last night from the hospital   Mom also has noticed pt has been staggering when he gets up, thinks he has some weakness   Writer assured mom that this info will be passed on to Dr Ferd GlassingPulcino  Writer also informed mom that [facility], mom will encourage pt to come to this

## 2017-09-10 NOTE — Telephone Encounter (Signed)
LCSW received a [facility], Shanda BumpsJessica advising that the patient went to the SW office requesting assistance with paying for his medications because his insurance lapsed. A voucher was provided to for all of the patient's meds. LCSW will forward to care management and remains available to assist.

## 2017-09-11 LAB — RED BLOOD CELLS
Coded Blood type: 5100
Component blood type: O POS
Dispense status: TRANSFUSED

## 2017-09-13 ENCOUNTER — Ambulatory Visit: Payer: MEDICAID | Attending: Primary Care | Admitting: Primary Care

## 2017-09-13 VITALS — BP 130/72 | HR 100 | Ht 74.0 in | Wt 172.9 lb

## 2017-09-13 DIAGNOSIS — D5700 Hb-SS disease with crisis, unspecified: Secondary | ICD-10-CM

## 2017-09-13 DIAGNOSIS — K5190 Ulcerative colitis, unspecified, without complications: Secondary | ICD-10-CM | POA: Insufficient documentation

## 2017-09-13 DIAGNOSIS — D564 Hereditary persistence of fetal hemoglobin [HPFH]: Secondary | ICD-10-CM

## 2017-09-13 NOTE — Telephone Encounter (Signed)
A text message was sent to notify the pt of the appt below:  Dr. Lana FishQazi, Hematology at 10/30/17 at 11:00am  [facility]-1st floor, new pt check in.  [phone]

## 2017-09-13 NOTE — Telephone Encounter (Signed)
Patient scheduled to [facility] on Wed 10/30/17 at 11:00am for a NPV.    Dr. Lana FishQazi, Hematology  [facility]-1st floor, new pt check in.  [phone]

## 2017-09-13 NOTE — Progress Notes (Signed)
[facility]  [address]  [place] [id number]  Phone: [phone]  Fax: [phone]      REASON FOR VISIT   Hospital follow up    SUBJECTIVE   Here for hospital follow up from acute on chronic anemia, VOC  Sickle cell disease type HbSHPFH    [facility]    Hospital course:   Name: Cameron [name]  MRN: [id number] DOB: 1996-06-12                Admit Date: 09/08/2017        Date of Discharge: 09/09/2017     Patient was accepted for discharge to   Left against medical advice [7]        Discharge Attending Physician: Theotis Burrowsborne, Christine, MD      Hospitalization Summary    CONCISE NARRATIVE:   Mr. Cameron BachelorLucius Sia [name] is a 21yo M w/SCD and newly diagnosed UC who presented w/hematochezia and was found to have a Hb of 4.9, consistent w/acute blood loss anemia c/b severe iron deficiency. Hemodynamically stable and asymptomatic except for hematochezia, was supported w/2u pRBCs. GI recommended steroids (40mg  prednisone daily) for likely active UC resulting in GIB, however, hematochezia ceased on its own prior to receiving any steroids.     Unfortunately, Mr. Cameron RadonBradley decided to leave AMA for an unclear reason, despite profound anemia and despite significant attempt at making his stay more tolerable. I reinforced at length the importance of making it to his GI appointment the following day, to determine dosing and duration of steroid course, as well as FU labwork (Hb). I also reinforced that the hospital will still be here for him should he quickly worsen.  Signed: Viviano SimasMatthew Donahue, MD  On: 09/09/2017  at: 6:54 PM    Received transfusion: yes 2 U PRBC    Medication changes at discharge: no changes  Prescriptions provided at discharge: no     Interval history:    - Has been feeling better since leaving the hospital. Did signout AMA because he felt ready to leave the hospital.   - No bloody stools since leaving the hospital.    - Did get 2U prbc   - Is now  feeling much better. Energy level is much improved.    - No no fever, chills   - No dyspnea, DOE, CP, cough   - No light headedness   - No hematochezia, melena   - No abdominal pain.   - 2x BMs daily. Stools mushy. No mucous or blood, as above.     PEG:  Current Pain score:   Pain    09/13/17 1036   PainSc:   0 - No pain     Pain on average the last week: 0  Degree of interference with Enjoyment of life in last week: 0  How has pain interfered with your General activity in the last week: 0  PEG SCORE 0    Current disease modifying therapy: none    Medications Reviewed and changes     Current Outpatient Prescriptions:     folic acid (FOLVITE) 1 MG tablet, Take 1 tablet (1 mg total) by mouth daily, Disp: 30 tablet, Rfl: 5    oxyCODONE (ROXICODONE) 10 MG immediate release tablet, Take 1 tablet (10 mg total) by mouth every 4 hours as needed for Pain.  Max daily dose: 6 tablets, Disp: 40 tablet, Rfl: 0    ferrous sulfate 325 (65 FE)  MG tablet, Take 1 tablet (325 mg total) by mouth 2 times daily (with meals), Disp: 100 tablet, Rfl: 3    mesalamine (ASACOL HD) 800 MG tablet, Take 3 tablets (2,400 mg total) by mouth 2 times daily, Disp: 180 tablet, Rfl: 2    clindamycin-benzoyl peroxide (BENZACLIN) gel, Apply topically 2 times daily, Disp: 25 g, Rfl: 5    naloxone (NARCAN) 4 mg/0.1 mL nasal spray, Instill 1 spray in 1 nostril once for opioid reversal. Repeat in alternating nostrils with new package every 2-3 minutes until response, Disp: 2 each, Rfl: 5    Electronic Thermometer (DIGITAL THERMOMETER) MISC, Use as directed, Disp: 1 each, Rfl: 0  Review of Systems   Constitutional: Negative for chills, fatigue and fever.   Respiratory: Negative for cough and shortness of breath.    Cardiovascular: Negative for chest pain.   Gastrointestinal: Negative for abdominal pain, blood in stool and diarrhea.   Neurological: Negative for dizziness.     Medications reviewed at today's visit    OBJECTIVE   PHYSICAL EXAM    Vitals:     09/13/17 1036   BP: 130/72   Pulse: 100   Weight: 78.4 kg (172 lb 14.4 oz)   Height: 1.88 m (6\' 2" )     Physical Exam  Gen: Sitting comfortably in no distress.  HEENT: NCAT, MMM, anicteric sclerae  CV: RRR, normal S1S2, no murmur  Resp: Unlabored respirations   GI: Flat, soft, nabs. No tenderness to deep palpation  Skin: warm and dry. No rash  Extrem: WWP,no edema      Lab results: 09/09/17  1022   WBC 7.6   Hemoglobin 5.4*   Hematocrit 19*   RBC 2.8*   Platelets 319       ASSESSMENT/Plan     Acute on chronic anemia 2/2 GIB due to UC    Currently improved after 2u PRBC. His UC disease activity has slowed down some without use of steroids.    Continue mesalamine at current dose    Continue supplemental iron and folate. Will need to follow iron stores closely once his bleeding is well controlled.    Encouraged to call if he starts having increased bloody stools. At this point would start prednisone 40mg  po daily.     SCD w/ HPFH   Pain is now absent after simple transfusion.   Continue prn oxycodone as above, avoid NSAIDs due to recent GIB though can likely restart once he stabilizes.   Folic acid, as above.    Given clinical improvement and cessation of bleeding following hospitalization, will hold on repeating labs. If he has repeated blood losses, will check earlier.     Can likely repeat at next visit with Dr. Concha NorwayPulcino (~1 month out).     Cameron [name] was seen today for sickle cell pain crisis.    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 Scheduled to see Dr. Concha NorwayPulcino 10/09/17    Electronically signed by Tamala SerFrancis W Davyon Fisch, MD 09/13/2017 10:56 AM  [facility], Phone: [phone]

## 2017-09-14 NOTE — Progress Notes (Signed)
Division of Gastroenterology and Hepatology    Outpatient Progress Note     Cameron [name]  [id number]  09/14/2017    HPI  Cameron [name] is 21 y.o. 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      GI History  Patient was admitted in May 2018 for loose stools mixed w/ blood and abdominal pain - at that time he was noted to have NSAID use, and underwent EGD. EGD findings were were normal. He subsequently underwent colonoscopy for further evaluation - this showed mild edema/inflammation from the sigmoid colon to the cecum. Rectum and terminal ileum appeared normal. Biopsies of the colon revealed mildly active chronic colitis in the right, transverse, and left colons, with moderately active chronic colitis in the rectum. CRP at that time was elevated to 13. CT abd/pelvis showed no evidence of active GI bleeding or findings to suggest IBD. After biopsy results, he was started on Asacol 4.8 g/day.     Interval History  Due to insurance he has not taken any medication since August of this year. He started to have hematochezia and abdominal pain 2 weeks ago. 4 BMs/day, loose/watery stool mixed with small amounts of blood. He reported lower abdominal cramping associated with his BMs, but no tenesmus. Able to tolerate PO intake without difficulty or worsening pain. No fevers or chills. Patient was sent to [facility] where he was found to have Hct 16. Increased bilirubin to 1.3 and Reticulocyte index of 0.99. He was admitted and 2pRBC transfusion was given. Asacol was restarted and hematochezia stopped. Left AMA yesterday.    Past medical history, past surgical history, social history, and family history were all reviewed and updated as needed.      Problem List    Patient Active Problem List   Diagnosis Code   • Sickle cell disease with HPFH D57.1, D56.4   • Back pain  M54.9   • Anemia D64.9   • History of esophagogastroduodenoscopy (EGD) Z98.890   • S/P colonoscopy Z98.890   • Ulcerative colitis K51.90   • Gastrointestinal hemorrhage, unspecified gastrointestinal hemorrhage type K92.2       Medical History  Past Medical History, Surgical History and Allergies reviewed and are unchanged since last visit.     Current Outpatient Prescriptions on File Prior to Encounter   Medication Sig Dispense Refill   • ferrous sulfate 325 (65 FE) MG tablet Take 1 tablet (325 mg total) by mouth 2 times daily (with meals) 100 tablet 3   • mesalamine (ASACOL HD) 800 MG tablet Take 3 tablets (2,400 mg total) by mouth 2 times daily 180 tablet 2   • clindamycin-benzoyl peroxide (BENZACLIN) gel Apply topically 2 times daily 25 g 5   • naloxone (NARCAN) 4 mg/0.1 mL nasal spray Instill 1 spray in 1 nostril once for opioid reversal. Repeat in alternating nostrils with new package every 2-3 minutes until response 2 each 5   • Electronic Thermometer (DIGITAL THERMOMETER) MISC Use as directed 1 each 0     No current facility-administered medications on file prior to encounter.        Review of Systems  Constitutional: No unexplained weight loss, fevers, night sweats  Eyes: No vision changes, conjunctival injection, or diplopia   Ears, nose, and throat: No epistaxis, dysphagia, odynophagia, globus sensation  Cardiovascular: No chest pain, palpitations, dyspnea on exertion, orthopnea  Pulmonary:   No cough, sputum production, shortness of breath  Gastrointestinal: See HPI  Genitourinary: No dysuria, polyuria, hematuria, or urinary incontinence.   Endocrine: No heat or cold intolerance, diaphoresis  Hematologic: No easy bruising or bleeding, anemia  Musculoskeletal: No joint pain, stiffness, erythema  Integumentary: No rashes, lesions, or nodules  Neurologic: No headaches, loss of extremity strength or sensation, confusion  Psychiatric: No anxiety, depression, or insomnia     Objective  BP 119/59    Pulse 74    Ht  188 cm (6' 2")    Wt 78.9 kg (174 lb)    BMI 22.34 kg/m²   General: NAD  Skin: Warm and dry. No rashes.  HEENT: NCAT. No conjunctival injection, pallor noted. Oropharynx clear, without ulceration, lesions  Neck: No masses, LAD or thyromegaly.     Heart: RRR. S1 and S2.   Lungs: Normal respiratory effort. CTAB.  Abdomen: Normoactive bowel sounds. Soft, nontender and nondistended. No guarding or rebound. No palpable masses.  Musculoskeletal: No red, swollen, or tender joints.  Vascular: Warm extremities.  Lymphatic: No cervical lymphadenopathy.  Neurologic: A&O x3. No focal deficits.     Laboratory Data  Lab Results   Component Value Date    NA 137 09/09/2017    K 4.1 09/09/2017    CL 105 09/09/2017    CO2 24 09/09/2017    UN 5 (L) 09/09/2017    CREAT 0.73 09/09/2017     Lab Results   Component Value Date    ALK 155 (H) 09/09/2017    TB 1.5 (H) 09/09/2017    DB <0.2 02/07/2017    ALB 3.2 (L) 09/09/2017    ALT 32 09/09/2017    AST 24 09/09/2017     Lab Results   Component Value Date    WBC 7.6 09/09/2017    HGB 5.4 (L) 09/09/2017    HCT 19 (LL) 09/09/2017    MCV 69 (L) 09/09/2017    PLT 319 09/09/2017     Lab Results   Component Value Date    LIP 15 02/07/2017    AMY 80 02/07/2017      Lab Results   Component Value Date    INR 1.5 (H) 09/08/2017       Endoscopy & Pathology  Colonoscopy (02/11/17):  Findings:  Anorectal:  · Normal tone, no masses  Terminal Ileum:  · Normal ileal mucosa  Cecum:  · Mild edema, loss of vascular markings, and punctate areas of   white exudate.  Ascending Colon:  · Mild edema, loss of vascular markings, and punctate areas of   white exudate.  Transverse Colon:  · Mild edema and loss of vascular markings.  Descending Colon:  · Mild edema and loss of vascular markings.  Sigmoid:  · Mild edema and loss of vascular markings.  Rectum:  · Normal rectum throughout   · Small internal hemorrhoids    Impression(s):  · Mild edema/inflammation from sigmoid to cecum. This is unlikely   the source of  bleeding  · Rectum and terminal ileum appeared normal.     FINAL DIAGNOSIS:   (A) Colon, right, biopsy:    - Chronic colitis, mildly active.    - No dysplasia is seen.     (B) Colon, transverse, biopsy:    - Chronic colitis, mildly active.    - No dysplasia is seen.     (C) Colon, left, biopsy:    - Chronic colitis, mildly active.    - No dysplasia is seen.     (B) Rectum, biopsy:    -   Chronic colitis, moderately active.   - No dysplasia is seen.     EGD (02/08/17):  Findings:   Esophagus: Normal  Stomach: Normal  Duodenum/small bowel: Normal     Impression(s):  1. Normal examination   2. No ulcers, erosions or bleeding stigmata seen   3. Bile seen in the duodenum    Assessment and Recommendations  21 y.o. 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     1. Ulcerative pancolitis: It seems to have responded with re-initiation of therapy.  -Continue Asacol 4.8g daily. He was cautioned to let us know immediately if he can't get the prescription and avoid delaying therapy.  -Patient has pancolitis and will need colonoscopy for dysplasia surveillance in 2026, with 1-2 years repeats.  -Will obtain MMR, varicella, Hep C titers for the case of needing biologics in the future.    2. IBD health maintenance:   # Immunization:   - Asked to update influenza, pneumonia, and hepatitis A and B vaccinations. 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   # Bone health:   - Check Vit D  # Micronutrients:    - Asked to continue to take Iron and folate. Recheck with vitamin B12 prior to next visit.  # Cancer screening/surveillance/prevention:   - As above.    3. Anemia: Recent hematochezia is playing a role, but needs careful follow up with PCP and Hematology as Retic count is relatively  low and hemolysis is still happening.    I plan to see Earlin Sweeden [name] back in 3-4 months for follow up in our clinic.     It was a pleasure seeing Gaige Sebo [name] in the [facility] clinic. Please do not hesitate to contact me with any questions or concerns.    Lockie Mola MD   Fellow (PGY-4), Division of Gastroenterology and Hepatology   [facility], [place], [address]  [place], [place] [address]

## 2017-09-18 ENCOUNTER — Other Ambulatory Visit: Payer: Self-pay | Admitting: Primary Care

## 2017-09-18 MED ORDER — OXYCODONE HCL 10 MG PO TABS *I*
ORAL_TABLET | ORAL | 0 refills | Status: DC
Start: 2017-09-18 — End: 2017-09-25

## 2017-09-18 NOTE — Telephone Encounter (Signed)
Confidential Drug Report  Search Terms: Cameron BachelorLucius Delman, December 12, 1995   Search Date: 09/18/2017 08:32:47 AM   Searching on behalf of: [id number] - Tiffany L [name]   The Drug Utilization Report below displays all of the controlled substance prescriptions, if any, that your patient has filled in the last twelve months. The information displayed on this report is compiled from pharmacy submissions to the Department, and accurately reflects the information as submitted by the pharmacies.  This report was requested by: Denton MeekKristie Candida Vetter   Reference #: [id number]   Others' Prescriptions  Patient Name: Cameron [name] Birth Date: December 12, 1995   Address[place] [address] [place], [place] [id number] Sex: Male   Rx Written Rx Dispensed Drug Quantity Days Supply Prescriber Name Payment Method Dispenser   09/10/2017 09/10/2017 oxycodone hcl 10 mg tablet  40 7 [name], Gwenith Spitziffany L MD [facility]   09/02/2017 09/04/2017 oxycodone hcl 10 mg tablet  40 7 [name], [name] Cash [facility]   08/26/2017 08/27/2017 oxycodone hcl 10 mg tablet  40 7 Karle BarrLie, Ariadne G MD [facility]   08/09/2017 08/13/2017 oxycodone hcl 10 mg tablet  40 7 [name], Gwenith Spitziffany L MD [facility]     Patient Name: Cameron BachelorLucius Pember Birth Date: December 12, 1995   Address[place] [address] [place] [id number] Sex: Male   Rx Written Rx Dispensed Drug Quantity Days Supply Prescriber Name Payment Method Dispenser   08/05/2017 08/06/2017 oxycodone hcl 10 mg tablet  40 7 [name], Gwenith Spitziffany L MD [facility]   07/02/2017 07/02/2017 oxycodone hcl 10 mg tablet  40 7 [name], Gwenith Spitziffany L MD [facility]   06/04/2017 06/04/2017 oxycodone hcl 10 mg tablet  40 7 [name], Gwenith Spitziffany L MD [facility]   04/30/2017 04/30/2017 oxycodone hcl 10 mg tablet  40 7 [name], Gwenith Spitziffany L MD [facility]   03/29/2017  03/29/2017 oxycodone hcl 10 mg tablet  40 7 [name], Gwenith Spitziffany L MD [facility]   02/22/2017 02/23/2017 oxycodone hcl 10 mg tablet  40 7 [name], Gwenith Spitziffany L MD [facility]   01/23/2017 01/23/2017 oxycodone hcl 10 mg tablet  40 7 [name], Gwenith Spitziffany L MD [facility]   01/01/2017 01/02/2017 oxycodone hcl 10 mg tablet  14 14 [name], Gwenith Spitziffany L MD [facility]   12/04/2016 12/05/2016 oxycodone hcl 10 mg tablet  30 15 Diannia RuderNoronha, Suzie A (MD) [facility]   11/05/2016 11/06/2016 oxycodone hcl 10 mg tablet  30 15 Diannia RuderNoronha, Suzie A (MD) [facility]   10/02/2016 10/02/2016 oxycodone hcl 10 mg tablet  30 15 Vista DeckNoronha, Suzie A (MD) [facility]

## 2017-09-18 NOTE — Telephone Encounter (Signed)
French Anaracy and the patient are aware

## 2017-09-18 NOTE — Telephone Encounter (Signed)
Please notify patient that refill completed to listed pharmacy

## 2017-09-20 ENCOUNTER — Encounter: Payer: Self-pay | Admitting: Primary Care

## 2017-09-23 ENCOUNTER — Telehealth: Payer: Self-pay | Admitting: Primary Care

## 2017-09-23 NOTE — Telephone Encounter (Signed)
[facility]  Pt reports he is doing well  No pain currently, feels overall at baseline  Pt reports he has all of his medication currently, does not require any refills at this time  Pt made aware of f/u with Dr. Concha NorwayPulcino on 1/16 - advised pt to call if he needs anything before that time  Pt verbalized understanding    Future Appointments  Date Time Provider Department Center   10/09/2017 8:30 AM [name], Diona Browneriffany Lynn, MD [facility] None   10/30/2017 11:00 AM Arlyss QueenQazi, Raman, MD [facility] None   01/14/2018 9:00 AM Lavenia AtlasLee, Edwin Jin Su, MD [facility] None     Will call Aus[facility] next week for continued transition care management until 10/10/17    Kirkland HunAndrea E Roy, RN  09/23/17

## 2017-09-24 NOTE — Telephone Encounter (Signed)
[facility] requires multiple labs dawn prior to 1/16  He has multiple labs ordered by GI and every 2 weeks blood counts

## 2017-09-25 ENCOUNTER — Other Ambulatory Visit: Payer: Self-pay | Admitting: Primary Care

## 2017-09-25 NOTE — Telephone Encounter (Signed)
Search Terms: Cameron BachelorLucius Bolger, [birth year]   Search Date: 09/25/2017 09:27:23 AM   Searching on behalf of: [id number] - Tiffany L [name]   The Drug Utilization Report below displays all of the controlled substance prescriptions, if any, that your patient has filled in the last twelve months. The information displayed on this report is compiled from pharmacy submissions to the Department, and accurately reflects the information as submitted by the pharmacies.  This report was requested by: Tonny BollmanShandell Joshlynn Alfonzo   Reference #: [id number]   Others' Prescriptions  Patient Name: Cameron [name] Birth Date: [birth year]   Address: [address] [place], [place] [id number] Sex: Male   Rx Written Rx Dispensed Drug Quantity Days Supply Prescriber Name Payment Method Dispenser   09/18/2017 09/18/2017 oxycodone hcl 10 mg tablet  40 7 [name], [name] [facility]   09/10/2017 09/10/2017 oxycodone hcl 10 mg tablet  40 7 [name], Gwenith Spitziffany L MD [facility]   09/02/2017 09/04/2017 oxycodone hcl 10 mg tablet  40 7 [name], [name] [facility]   08/26/2017 08/27/2017 oxycodone hcl 10 mg tablet  40 7 Karle BarrLie, Ariadne G MD [facility]   08/09/2017 08/13/2017 oxycodone hcl 10 mg tablet  40 7 [name], Gwenith Spitziffany L MD [facility]   08/05/2017 08/06/2017 oxycodone hcl 10 mg tablet  40 7 [name], Gwenith Spitziffany L MD [facility]   07/02/2017 07/02/2017 oxycodone hcl 10 mg tablet  40 7 [name], Gwenith Spitziffany L MD [facility]   06/04/2017 06/04/2017 oxycodone hcl 10 mg tablet  40 7 [name], Gwenith Spitziffany L MD [facility]     Patient Name: Cameron BachelorLucius Kuroda Birth Date: [birth year]   Address: [address] [place], [place] [id number] Sex: Male   Rx Written Rx Dispensed Drug Quantity Days Supply Prescriber Name Payment Method Dispenser   04/30/2017 04/30/2017 oxycodone hcl 10 mg  tablet  40 7 [name], Gwenith Spitziffany L MD [facility]   03/29/2017 03/29/2017 oxycodone hcl 10 mg tablet  40 7 [name], Gwenith Spitziffany L MD [facility]   02/22/2017 02/23/2017 oxycodone hcl 10 mg tablet  40 7 [name], Gwenith Spitziffany L MD [facility]   01/23/2017 01/23/2017 oxycodone hcl 10 mg tablet  40 7 [name], Gwenith Spitziffany L MD [facility]   01/01/2017 01/02/2017 oxycodone hcl 10 mg tablet  14 14 [name], Gwenith Spitziffany L MD [facility]   12/04/2016 12/05/2016 oxycodone hcl 10 mg tablet  30 15 Diannia RuderNoronha, Suzie A (MD) [facility]   11/05/2016 11/06/2016 oxycodone hcl 10 mg tablet  30 15 Diannia RuderNoronha, Suzie A (MD) [facility]   10/02/2016 10/02/2016 oxycodone hcl 10 mg tablet  30 15 Vista DeckNoronha, Suzie A (MD) [facility]   * - Drugs marked with an asterisk are compound drugs. If the compound drug is made up of more than one controlled substance, then each controlled substance will be a separate row in the table.      Report Suspicious Activity   Send Questions/Comments   Substance Abuse Treatment Information     Click the Report Suspicious Activity button to report information related to controlled substance suspicious activity to the [employer] of [employer]otic Enforcement.   Click the Send Questions/Comments  button to send questions about this report to the [facility] of Narcotic Enforcement, or call [phone].   Click the Substance Abuse Treatment Information button to go to the Office of Alcoholism and Substance Abuse Services website, www.oasas.thelispenard.com or call 9030738720.

## 2017-09-25 NOTE — Telephone Encounter (Signed)
THis rx that was lasting a month in October is now only lasting a week.  I am concerned about this change especially given he told Sue Lushndrea he was having no pain at her f/u call last week.  Please contact him to have labs drawn today, 2 weeks since last assessment

## 2017-09-26 NOTE — Telephone Encounter (Signed)
Patient called inquiring on the status on his refill request.  Patient can be reached at [phone]

## 2017-09-26 NOTE — Telephone Encounter (Signed)
Attempted to reach pt to discuss pain medication usage and need for lab draw  Left VM requesting call back, office number provided

## 2017-09-26 NOTE — Telephone Encounter (Signed)
Attempted to reach pt to discuss pain medication usage and need for lab draw  Left VM requesting call back, office number provided

## 2017-09-27 NOTE — Telephone Encounter (Signed)
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

## 2017-09-27 NOTE — Telephone Encounter (Signed)
Spoke to pt  He reports he has had increased back pain lately which is why he has been requesting refills more frequently  Pt states it is worse when he works for multiple days in a row  Pt told writer that right now back was not bothering him because he was not at work  Pt reports he is otherwise doing well  Writer advised he si due for lab work, pt states he will go tomorrow morning to get labs drawn  Writer explained the need for blood draws every 2 weeks as we are keeping a closer eye on his blood counts  Pt agreeable and verbalized understanding  Routing to provider for medication refill and aware labs to be drawn tomorrow

## 2017-09-30 MED ORDER — OXYCODONE HCL 10 MG PO TABS *I*
ORAL_TABLET | ORAL | 0 refills | Status: DC
Start: 2017-09-30 — End: 2017-10-10

## 2017-09-30 NOTE — Telephone Encounter (Signed)
Happy to refill - need istop

## 2017-09-30 NOTE — Telephone Encounter (Signed)
Spoke to pt as lab results not in the system  Pt reports he forgot to get them done over the weekend  Pt states he will get them done tomorrow  Writer explained the need for blood draw and why it is important for his health  Pt verbalized understanding  No further needs or refills at this time

## 2017-09-30 NOTE — Telephone Encounter (Signed)
Please notify patient that refill completed to listed pharmacy

## 2017-10-01 ENCOUNTER — Telehealth: Payer: Self-pay | Admitting: Primary Care

## 2017-10-01 NOTE — Telephone Encounter (Signed)
PA Determination:  Approved    Start Date: 10/01/17  End Date: 11/01/17    PA# [id number]    Pharmacy aware

## 2017-10-01 NOTE — Telephone Encounter (Signed)
[facility]  Prior Authorization Request    Date of request: 10/01/2017  Time of call: 4:12 PM     Request received from: [facility] [phone]    Medication Requiring PA:  Oxycodone HCL 10 mg tabs    Insurance information: Medicaid ID# [id number]    Insurance coverage confirmed: yes   Medication Class: Analgesic, Opioid    Covered Alternative Medication: PA submitted based on dx and continuation of therapy.

## 2017-10-04 ENCOUNTER — Telehealth: Payer: Self-pay | Admitting: Primary Care

## 2017-10-04 NOTE — Telephone Encounter (Signed)
[facility] TELEPHONE TRIAGE     Name of caller: Cameron [name]  Relationship to patient: mom  Phone:  [phone]  Fax (if required):       Reason for call:   [name], the patient keeps having Fecal Incontinence and seems to be getting worse.

## 2017-10-07 NOTE — Telephone Encounter (Signed)
Spoke with pts mother regarding her phone message  Mom aware pt has an appointment on Wednesday with Dr Ferd GlassingPulcino  Writer gave mom time of appointment to remind pt   Mom comfortable addressing issue then   Will route to MD to make sure this is addressed

## 2017-10-07 NOTE — Telephone Encounter (Signed)
Please assess with mom if Cameron [name] has been taking his Asacol consistently and address any barriers  I will notify Dr. Okey RegalSprung that this is occuring

## 2017-10-07 NOTE — Telephone Encounter (Signed)
Left message for Cameron [name]  Requested a call back to the office  Number provided

## 2017-10-09 ENCOUNTER — Ambulatory Visit: Payer: MEDICAID | Attending: Primary Care | Admitting: Primary Care

## 2017-10-09 ENCOUNTER — Encounter: Payer: Self-pay | Admitting: Primary Care

## 2017-10-09 VITALS — BP 128/62 | HR 84 | Ht 74.02 in | Wt 177.2 lb

## 2017-10-09 DIAGNOSIS — D649 Anemia, unspecified: Secondary | ICD-10-CM

## 2017-10-09 DIAGNOSIS — K5100 Ulcerative (chronic) pancolitis without complications: Secondary | ICD-10-CM | POA: Insufficient documentation

## 2017-10-09 LAB — COMPREHENSIVE METABOLIC PANEL
ALT: 42 U/L (ref 0–50)
AST: 24 U/L (ref 0–50)
Albumin: 3.4 g/dL — ABNORMAL LOW (ref 3.5–5.2)
Alk Phos: 208 U/L — ABNORMAL HIGH (ref 40–130)
Anion Gap: 10 (ref 7–16)
Bilirubin,Total: 0.7 mg/dL (ref 0.0–1.2)
CO2: 26 mmol/L (ref 20–28)
Calcium: 8.5 mg/dL — ABNORMAL LOW (ref 9.3–10.5)
Chloride: 101 mmol/L (ref 96–108)
Creatinine: 0.73 mg/dL (ref 0.50–1.00)
GFR,Black: 154 *
GFR,Caucasian: 133 *
Glucose: 71 mg/dL (ref 60–99)
Lab: 6 mg/dL (ref 6–20)
Potassium: 4.6 mmol/L (ref 3.3–5.1)
Sodium: 137 mmol/L (ref 133–145)
Total Protein: 8.3 g/dL — ABNORMAL HIGH (ref 6.3–7.7)

## 2017-10-09 LAB — RETICULOCYTES
Retic %: 2.3 % (ref 0.7–2.3)
Retic Abs: 79.8 10*3/uL (ref 33.8–124.0)

## 2017-10-09 LAB — CBC AND DIFFERENTIAL
Baso # K/uL: 0 10*3/uL (ref 0.0–0.1)
Basophil %: 0.5 %
Eos # K/uL: 0.3 10*3/uL (ref 0.0–0.5)
Eosinophil %: 4.6 %
Hematocrit: 24 % — ABNORMAL LOW (ref 40–51)
Hemoglobin: 6.9 g/dL — ABNORMAL LOW (ref 13.7–17.5)
IMM Granulocytes #: 0 10*3/uL (ref 0.0–0.1)
IMM Granulocytes: 0.2 %
Lymph # K/uL: 1.5 10*3/uL (ref 1.3–3.6)
Lymphocyte %: 25 %
MCH: 20 pg/cell — ABNORMAL LOW (ref 26–32)
MCHC: 29 g/dL — ABNORMAL LOW (ref 32–37)
MCV: 69 fL — ABNORMAL LOW (ref 79–92)
Mono # K/uL: 0.6 10*3/uL (ref 0.3–0.8)
Monocyte %: 10.7 %
Neut # K/uL: 3.5 10*3/uL (ref 1.8–5.4)
Nucl RBC # K/uL: 0 10*3/uL (ref 0.0–0.0)
Nucl RBC %: 0 /100 WBC (ref 0.0–0.2)
Platelets: 381 10*3/uL — ABNORMAL HIGH (ref 150–330)
RBC: 3.4 MIL/uL — ABNORMAL LOW (ref 4.6–6.1)
RDW: 24.4 % — ABNORMAL HIGH (ref 11.6–14.4)
Seg Neut %: 59 %
WBC: 5.9 10*3/uL (ref 4.2–9.1)

## 2017-10-09 LAB — TIBC
Iron: 23 ug/dL — ABNORMAL LOW (ref 45–170)
TIBC: 358 ug/dL (ref 250–450)
Transferrin Saturation: 6 % — ABNORMAL LOW (ref 20–55)

## 2017-10-09 LAB — RBC MORPHOLOGY

## 2017-10-09 LAB — CRP: CRP: 8 mg/L (ref 0–10)

## 2017-10-09 LAB — FERRITIN: Ferritin: 10 ng/mL — ABNORMAL LOW (ref 20–250)

## 2017-10-09 LAB — VITAMIN B12: Vitamin B12: 356 pg/mL (ref 232–1245)

## 2017-10-09 LAB — FOLATE: Folate: 4.5 ng/mL — AB (ref >=4.6)

## 2017-10-09 NOTE — Progress Notes (Signed)
[facility]  [address]  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  He confirmed that this had happened earlier last week.  It has not happened in several months since his initiation of therapy with mesalamine  He reports that it occurred at work - started with intense abdominal cramping followed immeadiately but fecal incontinence.  He contacted his mother and left work.  Stool was green and lose  He continues to have 3 lose stools, not bloody or mucoid, a day.  Each are preceded by abdominal cramping which is relieved by defecation  He has felt very isolated by the sx of his UC, reporting he is going out with friends less and this was the reason for deferring engagement in college and now is worried that he will never be able to attend.  Cameron [name] is dependent on a football scholarship to afford college and he was able to defer this until the fall while he continues at [employer] this semester    Has not had labs drawn since last evaluation in mid December.  Finding it difficult to work into his schedule of home and work and school.    Medications Reviewed and changes     Current Outpatient Prescriptions:     oxyCODONE (ROXICODONE) 10 MG immediate release tablet, Take 1 tablet (10 mg total) by mouth every 4 hours as needed for Pain.  Max daily dose: 6 tablets, Disp: 40 tablet, Rfl: 0    folic acid (FOLVITE) 1 MG tablet, Take 1 tablet (1 mg total) by mouth daily, Disp: 30 tablet, Rfl: 5    ferrous sulfate 325 (65 FE) MG tablet, Take 1 tablet (325 mg total) by mouth 2 times daily (with meals), Disp: 100 tablet, Rfl: 3    mesalamine (ASACOL HD) 800 MG tablet, Take 3 tablets (2,400 mg total) by mouth 2 times daily, Disp: 180 tablet, Rfl: 2     clindamycin-benzoyl peroxide (BENZACLIN) gel, Apply topically 2 times daily, Disp: 25 g, Rfl: 5    naloxone (NARCAN) 4 mg/0.1 mL nasal spray, Instill 1 spray in 1 nostril once for opioid reversal. Repeat in alternating nostrils with new package every 2-3 minutes until response, Disp: 2 each, Rfl: 5    Electronic Thermometer (DIGITAL THERMOMETER) MISC, Use as directed, Disp: 1 each, Rfl: 0  Review of Systems   Constitutional: Positive for activity change. Negative for appetite change, diaphoresis, fatigue, fever and unexpected weight change.   Respiratory: Negative for cough, chest tightness, shortness of breath and wheezing.    Cardiovascular: Negative for chest pain, palpitations and leg swelling.   Gastrointestinal: Positive for abdominal pain and diarrhea. Negative for abdominal distention, anal bleeding and blood in stool.   Musculoskeletal: Negative for back pain and myalgias.   Skin: Negative for color change.     OBJECTIVE     BP 128/62    Pulse 84    Ht 1.88 m (6' 2.02")    Wt 80.4 kg (177 lb 3.2 oz)    SpO2 100%    BMI 22.74 kg/m   Physical Exam   Constitutional: He appears well-developed and well-nourished.   Psychiatric: Judgment and thought  content normal.   Affect blunted, mood restricted     ASSESSMENT / DIAGNOSIS     [name] was seen today for follow up of ulcerative colitis with recent event of fecal incontinence in the setting of sickle cell disease.  Cameron [name] reports that he is having severe abdominal cramping which he is managing with oxy IR and an episode of fecal incontinence in the last week.  Sx of UC are limiting his engagement in college as planned and Cameron [name] is having sx of depression.  We discussed how to access supports from [facility] and offered multiple options to ensure continued engagement and sx management.  Cameron [name] would like [facility] providers to communicate with his mother if he is not available and acknowledges that he may have her call about events if he is too embarrassed or busy with  school to call himself.  He would also liek to come in every couple of weeks until sx are controlled.  I offered [facility] of [facility] but he does not feel ready yet but would like me to keep offering at visits.  Also identified a lab [address] he would liek to use that is closer to his commute from work to home.      [facility] [address]  Will update Dr. Okey RegalSprung on GI issues for guidance when lab results from office visit received  Thi is expecting a call from [facility] or GI tomorrow (he is comfortable with us talking with his mother if he is not available) when results are back    RD referral initiated for: Ulcerative colitis, new dx    Diagnoses and all orders for this visit:    Anemia, unspecified type  -     Reticulocytes    Ulcerative pancolitis without complication  -     Hepatitis A IgG AB  -     Hepatitis B surface antigen  -     Hepatitis B surf AB  (qual/quant)  -     Hepatitis B core antibody, total  -     Rubella antibody, IgG  -     Mumps antibody, IgG  -     Measles IgG anitbody  -     Varicella zoster IgG antibody  -     Vitamin D  -     Hepatitis C antibody  -     C reactive protein  -     CBC and differential  -     Comprehensive metabolic panel  -     Iron  -     TIBC  -     Ferritin  -     Vitamin B12  -     Folate        There are no Patient Instructions on file for this visit.  --Patient instructed to call if symptoms are not improving or worsening  --Follow-up arranged    Electronically signed by Benay PillowIFFANY L Analiese Krupka, MD 10/09/2017 8:46 AM  [facility], Phone: [phone]

## 2017-10-10 ENCOUNTER — Other Ambulatory Visit: Payer: Self-pay | Admitting: Primary Care

## 2017-10-10 LAB — HEPATITIS A IGG AB: Hepatitis A IGG: POSITIVE

## 2017-10-10 LAB — RUBELLA ANTIBODY, IGG: Rubella IgG AB: POSITIVE

## 2017-10-10 LAB — HEPATITIS B SURFACE ANTIBODY
HBV S Ab Quant: 1.18 m[IU]/mL
HBV S Ab: NEGATIVE

## 2017-10-10 LAB — HEPATITIS C ANTIBODY: Hep C Ab: NEGATIVE

## 2017-10-10 LAB — MEASLES IGG AB: Measles IgG: POSITIVE

## 2017-10-10 LAB — HEPATITIS B SURFACE ANTIGEN: HBV S Ag: NEGATIVE

## 2017-10-10 LAB — HEPATITIS B CORE ANTIBODY, TOTAL: HBV Core Ab: NEGATIVE

## 2017-10-10 LAB — HEB B INT

## 2017-10-10 LAB — VARICELLA ZOSTER IGG AB: VZV IgG: NEGATIVE

## 2017-10-10 LAB — MUMPS ANTIBODY, IGG: Mumps IgG: POSITIVE

## 2017-10-10 MED ORDER — OXYCODONE HCL 10 MG PO TABS *I*
ORAL_TABLET | ORAL | 0 refills | Status: DC
Start: 2017-10-10 — End: 2017-10-11

## 2017-10-10 NOTE — Telephone Encounter (Signed)
The patient called in through the answering service and left a message.    The patient states that he needs a refill on his pain medication and his sickle cell medication.        Confidential Drug Report  Search Terms: Cameron [name], [birth year]   Search Date: 10/10/2017 02:45:02 PM   Searching on behalf of: [id number] - Tiffany L [name]   The Drug Utilization Report below displays all of the controlled substance prescriptions, if any, that your patient has filled in the last twelve months. The information displayed on this report is compiled from pharmacy submissions to the Department, and accurately reflects the information as submitted by the pharmacies.  This report was requested by: Denton MeekKristie Mickie Kozikowski   Reference #: [id number]   Others' Prescriptions  Patient Name: Cameron [name] Birth Date: [birth year]   Address: [address] [place], [place] [id number] Sex: Male   Rx Written Rx Dispensed Drug Quantity Days Supply Prescriber Name Payment Method Dispenser   09/30/2017 10/02/2017 oxycodone hcl 10 mg tablet  40 7 [name], [name] Medicaid [facility]   09/18/2017 09/18/2017 oxycodone hcl 10 mg tablet  40 7 [name], [name] Cash [facility]   09/10/2017 09/10/2017 oxycodone hcl 10 mg tablet  40 7 [name], Gwenith Spitziffany L MD Insurance [facility]   09/02/2017 09/04/2017 oxycodone hcl 10 mg tablet  40 7 [name], [name] Cash [facility]   08/26/2017 08/27/2017 oxycodone hcl 10 mg tablet  40 7 Karle BarrLie, Ariadne G MD Cash [facility]   08/09/2017 08/13/2017 oxycodone hcl 10 mg tablet  40 7 [name], Gwenith Spitziffany L MD Insurance [facility]   08/05/2017 08/06/2017 oxycodone hcl 10 mg tablet  40 7 [name], Gwenith Spitziffany L MD Insurance [facility]   07/02/2017 07/02/2017 oxycodone hcl 10 mg tablet  40 7 [name], Gwenith Spitziffany L MD Insurance [facility]   06/04/2017 06/04/2017 oxycodone hcl 10 mg tablet   40 7 [name], Gwenith Spitziffany L MD [facility]     Patient Name: Cameron [name] Birth Date: [birth year]   Address: [address] [place], [place] [id number] Sex: Male   Rx Written Rx Dispensed Drug Quantity Days Supply Prescriber Name Payment Method Dispenser   04/30/2017 04/30/2017 oxycodone hcl 10 mg tablet  40 7 [name], Gwenith Spitziffany L MD Insurance [facility]   03/29/2017 03/29/2017 oxycodone hcl 10 mg tablet  40 7 [name], Gwenith Spitziffany L MD Insurance [facility]   02/22/2017 02/23/2017 oxycodone hcl 10 mg tablet  40 7 [name], Gwenith Spitziffany L MD Insurance [facility]   01/23/2017 01/23/2017 oxycodone hcl 10 mg tablet  40 7 [name], Gwenith Spitziffany L MD Insurance [facility]   01/01/2017 01/02/2017 oxycodone hcl 10 mg tablet  14 14 [name], Gwenith Spitziffany L MD Insurance [facility]   12/04/2016 12/05/2016 oxycodone hcl 10 mg tablet  30 15 Diannia RuderNoronha, Suzie A (MD) Cash [facility]   11/05/2016 11/06/2016 oxycodone hcl 10 mg tablet  30 15 Vista DeckNoronha, Suzie A (MD) [facility]

## 2017-10-10 NOTE — Telephone Encounter (Signed)
rx sent

## 2017-10-10 NOTE — Telephone Encounter (Signed)
Discussed at appointment yesterday

## 2017-10-11 ENCOUNTER — Other Ambulatory Visit: Payer: Self-pay | Admitting: Primary Care

## 2017-10-11 ENCOUNTER — Telehealth: Payer: Self-pay

## 2017-10-11 ENCOUNTER — Telehealth: Payer: Self-pay | Admitting: Primary Care

## 2017-10-11 NOTE — Telephone Encounter (Signed)
Search Date: 10/11/2017 11:43:46 AM   Searching on behalf of: [id number] - Tiffany L [name]   The Drug Utilization Report below displays all of the controlled substance prescriptions, if any, that your patient has filled in the last twelve months. The information displayed on this report is compiled from pharmacy submissions to the Department, and accurately reflects the information as submitted by the pharmacies.  This report was requested by: Tonny BollmanShandell Arlyn Buerkle   Reference #: [id number]   Others' Prescriptions  Patient Name: Cameron BachelorLucius Lietzke Birth Date: [birth year]   Address: [address] [place], [place] [id number] Sex: Male   Rx Written Rx Dispensed Drug Quantity Days Supply Prescriber Name Payment Method Dispenser   09/30/2017 10/02/2017 oxycodone hcl 10 mg tablet  40 7 [name], [name] Medicaid [facility]   09/18/2017 09/18/2017 oxycodone hcl 10 mg tablet  40 7 [name], [name] Cash [facility]   09/10/2017 09/10/2017 oxycodone hcl 10 mg tablet  40 7 [name], Gwenith Spitziffany L MD Insurance [facility]   09/02/2017 09/04/2017 oxycodone hcl 10 mg tablet  40 7 [name], [name] Cash [facility]   08/26/2017 08/27/2017 oxycodone hcl 10 mg tablet  40 7 Karle BarrLie, Ariadne G MD Cash [facility]   08/09/2017 08/13/2017 oxycodone hcl 10 mg tablet  40 7 [name], Gwenith Spitziffany L MD Insurance [facility]   08/05/2017 08/06/2017 oxycodone hcl 10 mg tablet  40 7 [name], Gwenith Spitziffany L MD Insurance [facility]   07/02/2017 07/02/2017 oxycodone hcl 10 mg tablet  40 7 [name], Gwenith Spitziffany L MD Insurance [facility]   06/04/2017 06/04/2017 oxycodone hcl 10 mg tablet  40 7 [name], Gwenith Spitziffany L MD Insurance [facility]     Patient Name: Cameron BachelorLucius Adames Birth Date: [birth year]   Address: [address] [place], [place] [id number] Sex: Male   Rx Written Rx Dispensed Drug Quantity Days Supply Prescriber  Name Payment Method Dispenser   04/30/2017 04/30/2017 oxycodone hcl 10 mg tablet  40 7 [name], Gwenith Spitziffany L MD Insurance [facility]   03/29/2017 03/29/2017 oxycodone hcl 10 mg tablet  40 7 [name], Gwenith Spitziffany L MD Insurance [facility]   02/22/2017 02/23/2017 oxycodone hcl 10 mg tablet  40 7 [name], Gwenith Spitziffany L MD Insurance [facility]   01/23/2017 01/23/2017 oxycodone hcl 10 mg tablet  40 7 [name], Gwenith Spitziffany L MD Insurance [facility]   01/01/2017 01/02/2017 oxycodone hcl 10 mg tablet  14 14 [name], Gwenith Spitziffany L MD Insurance [facility]   12/04/2016 12/05/2016 oxycodone hcl 10 mg tablet  30 15 Diannia RuderNoronha, Suzie A (MD) Cash [facility]   11/05/2016 11/06/2016 oxycodone hcl 10 mg tablet  30 15 Vista DeckNoronha, Suzie A (MD) Cash [facility]   * - Drugs marked with an asterisk are compound drugs. If the compound drug is made up of more than one controlled substance, then each controlled substance will be a separate row in the table.      Report Suspicious Activity   Send Questions/Comments   Substance Abuse Treatment Information     Click the Report Suspicious Activity button to report information related to controlled substance suspicious activity to the [employer] Enforcement.   Click the Send Questions/Comments button to send questions about this report  to the [employer], or call [phone].   Click the Substance Abuse Treatment Information button to go to the Office of Alcoholism and Substance Abuse Services website, www.oasas.thelispenard.com or call 778-537-5366.    2019 [facility]

## 2017-10-11 NOTE — Telephone Encounter (Signed)
Called patient. Scheduled fuv with dr. Nedra HaiLee for 2/5 at 11am on ac5. Patient confirmed.

## 2017-10-11 NOTE — Telephone Encounter (Signed)
PA Determination:  Approved    Start Date: 10/11/17  End Date: 01/09/18    PA # [id number]    Pharmacy aware    Even though the script does not have refill, the PA has 2 refills

## 2017-10-11 NOTE — Telephone Encounter (Signed)
-----   Message from Quitman LivingsBrandon Sprung, MD sent at 10/11/2017  1:24 PM EST -----  I believe Dr. Nedra HaiLee is out of the office this week, but we will work to see if we can accommodate patient sooner. A simple test is to check a fecal calprotectin to see the degree of inflammation if there is concern for active colitis.    Cielle Aguila, please see if patient can be seen sooner by Dr. Nedra HaiLee.  ----- Message -----  From: Delrae AlfredPulcino, Tiffany Lynn, MD  Sent: 10/10/2017   1:12 PM  To: Quitman LivingsBrandon Sprung, MD    FYI - he is having persistent TID abdominal cramping and lose stool and an episode of fecal incontinence at work last week -  He reports good medication adherence with mesalamine.  I am going to have him f/u closely with me every 2 weeks more to keep him engaged and manage his depression and he will see my dietician.  How would you like to procede for the UC management?  Do you want to see him sooner or should I make some med changes now?  Thanks so much  Tiffany  ----- Message -----  From: Edi, [facility]  Sent: 10/09/2017   1:24 PM  To: Tiffany Rowland LatheLynn [name], MD

## 2017-10-11 NOTE — Telephone Encounter (Signed)
[facility]  Prior Authorization Request    Date of request: 10/11/2017  Time of call: 10:01 AM     Request received from: [facility] [phone]    Medication Requiring PA:  Oxycodone HCL 10 mg    Insurance information:  Medicaid ID# [id number]    Insurance coverage confirmed: yes     Medication Class: Analgesic, Opioid    Covered Alternative Medication: submitted PA based on urgency and continuation of therapy.

## 2017-10-15 LAB — VITAMIN D
25-OH VIT D2: <4 ng/mL
25-OH VIT D3: 10 ng/mL
25-OH Vit Total: 10 ng/mL — ABNORMAL LOW (ref 30–60)

## 2017-10-20 ENCOUNTER — Telehealth: Payer: Self-pay | Admitting: Student in an Organized Health Care Education/Training Program

## 2017-10-20 MED ORDER — VITAMIN D 1000 UNIT PO TABS *A*
1000.0000 [IU] | ORAL_TABLET | Freq: Every day | ORAL | 0 refills | Status: DC
Start: 2017-10-20 — End: 2017-11-12

## 2017-10-20 NOTE — Telephone Encounter (Signed)
Called patient to inform low Vit D levels, and asked to take 1000 IU daily.    Demonstrated understanding.    Bjorn PippinEdwin Morrill Bomkamp, MD  PGY-4, Gastroenterology & Hepatology Fellow

## 2017-10-22 ENCOUNTER — Other Ambulatory Visit: Payer: Self-pay | Admitting: Primary Care

## 2017-10-22 NOTE — Telephone Encounter (Signed)
Confidential Drug Report  Search Terms: Cameron BachelorLucius Pacella, July 24, 1996   Search Date: 10/22/2017 03:45:42 PM   Searching on behalf of: [id number] - Tiffany L [name]   The Drug Utilization Report below displays all of the controlled substance prescriptions, if any, that your patient has filled in the last twelve months. The information displayed on this report is compiled from pharmacy submissions to the Department, and accurately reflects the information as submitted by the pharmacies.  This report was requested by: Denton MeekKristie Kaz Auld   Reference #: [id number]   Others' Prescriptions  Patient Name: Cameron [name] Birth Date: July 24, 1996   Address: [address] [place], [place] [id number] Sex: Male   Rx Written Rx Dispensed Drug Quantity Days Supply Prescriber Name Payment Method Dispenser   10/10/2017 10/12/2017 oxycodone hcl 10 mg tablet  40 7 [name], [name] Medicaid [facility]   09/30/2017 10/02/2017 oxycodone hcl 10 mg tablet  40 7 [name], [name] Medicaid [facility]   09/18/2017 09/18/2017 oxycodone hcl 10 mg tablet  40 7 [name], [name] Cash [facility]   09/10/2017 09/10/2017 oxycodone hcl 10 mg tablet  40 7 [name], Gwenith Spitziffany L MD Insurance [facility]   09/02/2017 09/04/2017 oxycodone hcl 10 mg tablet  40 7 [name], [name] Cash [facility]   08/26/2017 08/27/2017 oxycodone hcl 10 mg tablet  40 7 Karle BarrLie, Ariadne G MD Cash [facility]   08/09/2017 08/13/2017 oxycodone hcl 10 mg tablet  40 7 [name], Gwenith Spitziffany L MD Insurance [facility]   08/05/2017 08/06/2017 oxycodone hcl 10 mg tablet  40 7 [name], Gwenith Spitziffany L MD Insurance [facility]   07/02/2017 07/02/2017 oxycodone hcl 10 mg tablet  40 7 [name], Gwenith Spitziffany L MD Insurance [facility]   06/04/2017 06/04/2017 oxycodone hcl 10 mg tablet  40 7 [name], Gwenith Spitziffany L MD Insurance [facility]     Patient Name: Cameron [name] Birth Date: July 24, 1996   Address: [address] [place], [place] [id number] Sex: Male   Rx Written Rx Dispensed Drug Quantity Days Supply Prescriber Name Payment Method Dispenser   04/30/2017 04/30/2017 oxycodone hcl 10 mg tablet  40 7 [name], Gwenith Spitziffany L MD Insurance [facility]   03/29/2017 03/29/2017 oxycodone hcl 10 mg tablet  40 7 [name], Gwenith Spitziffany L MD Insurance [facility]   02/22/2017 02/23/2017 oxycodone hcl 10 mg tablet  40 7 [name], Gwenith Spitziffany L MD Insurance [facility]   01/23/2017 01/23/2017 oxycodone hcl 10 mg tablet  40 7 [name], Gwenith Spitziffany L MD Insurance [facility]   01/01/2017 01/02/2017 oxycodone hcl 10 mg tablet  14 14 [name], Gwenith Spitziffany L MD Insurance [facility]   12/04/2016 12/05/2016 oxycodone hcl 10 mg tablet  30 15 Diannia RuderNoronha, Suzie A (MD) Cash [facility]   11/05/2016 11/06/2016 oxycodone hcl 10 mg tablet  30 15 Vista DeckNoronha, Suzie A (MD) Cash [facility]

## 2017-10-23 ENCOUNTER — Telehealth: Payer: Self-pay | Admitting: Primary Care

## 2017-10-23 ENCOUNTER — Ambulatory Visit: Payer: MEDICAID | Admitting: Primary Care

## 2017-10-23 DIAGNOSIS — K5190 Ulcerative colitis, unspecified, without complications: Secondary | ICD-10-CM

## 2017-10-23 MED ORDER — FOLIC ACID 1 MG PO TABS *I*
1.0000 mg | ORAL_TABLET | Freq: Every day | ORAL | 5 refills | Status: DC
Start: 2017-10-23 — End: 2017-12-25

## 2017-10-23 MED ORDER — OXYCODONE HCL 10 MG PO TABS *I*
ORAL_TABLET | ORAL | 0 refills | Status: DC
Start: 2017-10-23 — End: 2017-11-01

## 2017-10-23 NOTE — Telephone Encounter (Signed)
Cameron [name] left his last aptt with me with plan to come in every 1-2 weeks for f/u for visit and labs and does not look like that was scheduled - can see myself or Barkley BrunsKristine  It would also be helpful if Gedalya can provide a stool sample to see how inflammed his gut is given recent sx.  Can you gauge whether Cameron [name] feels he can do this at home or does he need to do it in the clinic? (order is in but he will need hat and collection items)

## 2017-10-23 NOTE — Telephone Encounter (Signed)
Please notify patient that refill completed to listed pharmacy

## 2017-10-23 NOTE — Telephone Encounter (Signed)
Pt has f/u scheduled today at 2pm. Will discuss stool sample at visit today.

## 2017-10-24 ENCOUNTER — Telehealth: Payer: Self-pay | Admitting: Primary Care

## 2017-10-24 NOTE — Telephone Encounter (Signed)
Received a PA request for Mesalamine 800 mg DR tabs

## 2017-10-24 NOTE — Telephone Encounter (Signed)
Attempted to reach pt to schedule another appt  Pt did not come to yesterday's appt  Left VM requesting call back - phone number provided

## 2017-10-25 NOTE — Telephone Encounter (Signed)
Thank you :)

## 2017-10-25 NOTE — Telephone Encounter (Signed)
Attempted to reach Arjuna again, no answer  Writer called patient's mom (he indicated at last office visit ok to call mom)  Mom states that patient is likely sleeping   Notified her I was calling to reschedule his appointment from this week  Mom states she has been reminding patient of his appointments, and she was unaware he had an appointment scheduled on 1/30  Mom requested he be scheduled next Tuesday as he has off  Scheduled with Dr. Concha NorwayPulcino at 4:30pm at [employer]2/5    Writer notified mom that Dr. Concha NorwayPulcino would like patient to obtain a stool sample, and that I can provide a hat and collection supplies for him to take home  Mom states she will encourage patient to come pick up these items before his next appointment  Writer will leave a bag with supplies at front desk for patient for him to pick up

## 2017-10-29 ENCOUNTER — Ambulatory Visit: Payer: Medicaid Other | Admitting: Student in an Organized Health Care Education/Training Program

## 2017-10-29 ENCOUNTER — Encounter: Payer: Self-pay | Admitting: Primary Care

## 2017-10-29 ENCOUNTER — Ambulatory Visit: Payer: Medicaid Other | Attending: Primary Care | Admitting: Primary Care

## 2017-10-29 VITALS — BP 126/84 | HR 89 | Ht 74.0 in | Wt 185.0 lb

## 2017-10-29 DIAGNOSIS — K5190 Ulcerative colitis, unspecified, without complications: Secondary | ICD-10-CM | POA: Insufficient documentation

## 2017-10-29 DIAGNOSIS — D571 Sickle-cell disease without crisis: Secondary | ICD-10-CM | POA: Insufficient documentation

## 2017-10-29 LAB — CBC AND DIFFERENTIAL
Baso # K/uL: 0 10*3/uL (ref 0.0–0.1)
Basophil %: 0.4 %
Eos # K/uL: 0.3 10*3/uL (ref 0.0–0.5)
Eosinophil %: 5.3 %
Hematocrit: 22 % — ABNORMAL LOW (ref 40–51)
Hemoglobin: 6.5 g/dL — ABNORMAL LOW (ref 13.7–17.5)
IMM Granulocytes #: 0 10*3/uL
IMM Granulocytes: 0.2 %
Lymph # K/uL: 1.5 10*3/uL (ref 1.3–3.6)
Lymphocyte %: 31 %
MCH: 19 pg/cell — ABNORMAL LOW (ref 26–32)
MCHC: 30 g/dL — ABNORMAL LOW (ref 32–37)
MCV: 65 fL — ABNORMAL LOW (ref 79–92)
Mono # K/uL: 0.8 10*3/uL (ref 0.3–0.8)
Monocyte %: 16.5 %
Neut # K/uL: 2.3 10*3/uL (ref 1.8–5.4)
Nucl RBC # K/uL: 0 10*3/uL (ref 0.0–0.0)
Nucl RBC %: 0 /100 WBC (ref 0.0–0.2)
Platelets: 308 10*3/uL (ref 150–330)
RBC: 3.4 MIL/uL — ABNORMAL LOW (ref 4.6–6.1)
RDW: 21.1 % — ABNORMAL HIGH (ref 11.6–14.4)
Seg Neut %: 46.6 %
WBC: 4.9 10*3/uL (ref 4.2–9.1)

## 2017-10-29 NOTE — Progress Notes (Signed)
This report was requested by: Laruth BouchardJennifer Yalena Colon   Reference #: [id number]   Others' Prescriptions  Patient Name: Cameron BachelorLucius Sigler Birth Date: [birth year]   Address[place] [address] [place], [place] [id number] Sex: Male   Rx Written Rx Dispensed Drug Quantity Days Supply Prescriber Name Payment Method Dispenser   10/23/2017 10/25/2017 oxycodone hcl 10 mg tablet  40 7 [name], [name] Medicaid [facility]   10/10/2017 10/12/2017 oxycodone hcl 10 mg tablet  40 7 [name], [name] Medicaid [facility]   09/30/2017 10/02/2017 oxycodone hcl 10 mg tablet  40 7 [name], [name] Medicaid [facility]   09/18/2017 09/18/2017 oxycodone hcl 10 mg tablet  40 7 [name], [name] Cash [facility]   09/10/2017 09/10/2017 oxycodone hcl 10 mg tablet  40 7 [name], Gwenith Spitziffany L MD Insurance [facility]   09/02/2017 09/04/2017 oxycodone hcl 10 mg tablet  40 7 [name], [name] Cash [facility]   08/26/2017 08/27/2017 oxycodone hcl 10 mg tablet  40 7 Karle BarrLie, Ariadne G MD Cash [facility]   08/09/2017 08/13/2017 oxycodone hcl 10 mg tablet  40 7 [name], Gwenith Spitziffany L MD Insurance [facility]   08/05/2017 08/06/2017 oxycodone hcl 10 mg tablet  40 7 [name], Gwenith Spitziffany L MD Insurance [facility]   07/02/2017 07/02/2017 oxycodone hcl 10 mg tablet  40 7 [name], Gwenith Spitziffany L MD Insurance [facility]   06/04/2017 06/04/2017 oxycodone hcl 10 mg tablet  40 7 [name], Gwenith Spitziffany L MD [facility]     Patient Name: Cameron BachelorLucius Starn Birth Date: [birth year]   Address: [address] [place], [place] [id number] Sex: Male   Rx Written Rx Dispensed Drug Quantity Days Supply Prescriber Name Payment Method Dispenser   04/30/2017 04/30/2017 oxycodone hcl 10 mg tablet  40 7 [name], Gwenith Spitziffany L MD [facility]   03/29/2017 03/29/2017  oxycodone hcl 10 mg tablet  40 7 [name], Gwenith Spitziffany L MD Insurance [facility]   02/22/2017 02/23/2017 oxycodone hcl 10 mg tablet  40 7 [name], Gwenith Spitziffany L MD Insurance [facility]   01/23/2017 01/23/2017 oxycodone hcl 10 mg tablet  40 7 [name], Gwenith Spitziffany L MD Insurance [facility]   01/01/2017 01/02/2017 oxycodone hcl 10 mg tablet  14 14 [name], Gwenith Spitziffany L MD Insurance [facility]   12/04/2016 12/05/2016 oxycodone hcl 10 mg tablet  30 15 Diannia RuderNoronha, Suzie A (MD) Cash [facility]   11/05/2016 11/06/2016 oxycodone hcl 10 mg tablet  30 15 Vista DeckNoronha, Suzie A (MD) [facility]   * - Drugs marked with an asterisk are compound drugs. If the compound drug is made up of more than one controlled substance, then each controlled substance will be a separate row in the table.

## 2017-10-30 ENCOUNTER — Ambulatory Visit: Payer: Medicaid Other | Admitting: Hematology and Oncology

## 2017-10-31 NOTE — Telephone Encounter (Signed)
PA no longer required. As of 2/1, [facility] and pharmacy able to run claim through with no PA requirement. Will inform patient that he can pick up shortly.     Guy Francoezarta Marlina Cataldi, PharmD, BCPS  [phone]

## 2017-11-01 ENCOUNTER — Other Ambulatory Visit: Payer: Self-pay | Admitting: Primary Care

## 2017-11-01 MED ORDER — OXYCODONE HCL 10 MG PO TABS *I*
ORAL_TABLET | ORAL | 0 refills | Status: DC
Start: 2017-11-01 — End: 2017-11-13

## 2017-11-01 NOTE — Telephone Encounter (Signed)
Please notify patient that refill completed to listed pharmacy

## 2017-11-01 NOTE — Telephone Encounter (Signed)
Confidential Drug Report  Search Terms: Cameron BachelorLucius Highley, 11-Sep-1996   Search Date: 11/01/2017 01:29:04 PM   Searching on behalf of: [id number] - Tiffany L [name]   The Drug Utilization Report below displays all of the controlled substance prescriptions, if any, that your patient has filled in the last twelve months. The information displayed on this report is compiled from pharmacy submissions to the Department, and accurately reflects the information as submitted by the pharmacies.  This report was requested by: Denton MeekKristie Cheyne Bungert   Reference #: [id number]   Others' Prescriptions  Patient Name: Cameron [name] Birth Date: 11-Sep-1996   Address: [address] [place], [place] [id number] Sex: Male   Rx Written Rx Dispensed Drug Quantity Days Supply Prescriber Name Payment Method Dispenser   10/23/2017 10/25/2017 oxycodone hcl 10 mg tablet  40 7 [name], [name] Medicaid [facility]   10/10/2017 10/12/2017 oxycodone hcl 10 mg tablet  40 7 [name], [name] Medicaid [facility]   09/30/2017 10/02/2017 oxycodone hcl 10 mg tablet  40 7 [name], [name] Medicaid [facility]   09/18/2017 09/18/2017 oxycodone hcl 10 mg tablet  40 7 [name], [name] Cash [facility]   09/10/2017 09/10/2017 oxycodone hcl 10 mg tablet  40 7 [name], Gwenith Spitziffany L MD Insurance [facility]   09/02/2017 09/04/2017 oxycodone hcl 10 mg tablet  40 7 [name], [name] Cash [facility]   08/26/2017 08/27/2017 oxycodone hcl 10 mg tablet  40 7 Karle BarrLie, Ariadne G MD Cash [facility]   08/09/2017 08/13/2017 oxycodone hcl 10 mg tablet  40 7 [name], Gwenith Spitziffany L MD Insurance [facility]   08/05/2017 08/06/2017 oxycodone hcl 10 mg tablet  40 7 [name], Gwenith Spitziffany L MD Insurance [facility]   07/02/2017 07/02/2017 oxycodone hcl 10 mg tablet  40 7 [name], Gwenith Spitziffany L MD Insurance [facility]   06/04/2017 06/04/2017 oxycodone hcl 10 mg tablet  40 7 [name], Gwenith Spitziffany L MD [facility]     Patient Name: Cameron BachelorLucius Fana Birth Date: 11-Sep-1996   Address: [address] [place], [place] [id number] Sex: Male   Rx Written Rx Dispensed Drug Quantity Days Supply Prescriber Name Payment Method Dispenser   04/30/2017 04/30/2017 oxycodone hcl 10 mg tablet  40 7 [name], Gwenith Spitziffany L MD [facility]   03/29/2017 03/29/2017 oxycodone hcl 10 mg tablet  40 7 [name], Gwenith Spitziffany L MD [facility]   02/22/2017 02/23/2017 oxycodone hcl 10 mg tablet  40 7 [name], Gwenith Spitziffany L MD Insurance [facility]   01/23/2017 01/23/2017 oxycodone hcl 10 mg tablet  40 7 [name], Gwenith Spitziffany L MD [facility]   01/01/2017 01/02/2017 oxycodone hcl 10 mg tablet  14 14 [name], Gwenith Spitziffany L MD Insurance [facility]   12/04/2016 12/05/2016 oxycodone hcl 10 mg tablet  30 15 Diannia RuderNoronha, Suzie A (MD) Cash [facility]   11/05/2016 11/06/2016 oxycodone hcl 10 mg tablet  30 15 Vista DeckNoronha, Suzie A (MD) [facility]

## 2017-11-02 NOTE — Progress Notes (Signed)
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    SUBJECTIVE     Kaiyan reports he has been taking his medications consistently and has not had any episodes of fecal incontinence or abdominal cramping since his last visit  He is surprised to report he is having "normal" stools - stooling daily   Has decided to continuing working 40 hr a week this semester but has not signed up for any classes this semester - 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 [employer] [place] in the fall  Has not had lab work  Completed since last visit     Aware of upcoming appointment with hematology and scheduled appointment with GI  Medications Reviewed and changes     Current Outpatient Prescriptions:     folic acid (FOLVITE) 1 MG tablet, Take 1 tablet (1 mg total) by mouth daily, Disp: 30 tablet, Rfl: 5    cholecalciferol (VITAMIN D) 1000 UNIT tablet, Take 1 tablet (1,000 Units total) by mouth daily, Disp: 180 tablet, Rfl: 0    ferrous sulfate 325 (65 FE) MG tablet, Take 1 tablet (325 mg total) by mouth 2 times daily (with meals), Disp: 100 tablet, Rfl: 3    mesalamine (ASACOL HD) 800 MG tablet, Take 3 tablets (2,400 mg total) by mouth 2 times daily, Disp: 180 tablet, Rfl: 2    clindamycin-benzoyl peroxide (BENZACLIN) gel, Apply topically 2 times daily, Disp: 25 g, Rfl: 5    naloxone (NARCAN) 4 mg/0.1 mL nasal spray, Instill 1 spray in 1 nostril once for opioid reversal. Repeat in alternating nostrils with new package every 2-3 minutes until response, Disp: 2 each, Rfl: 5    Electronic Thermometer (DIGITAL THERMOMETER) MISC, Use as directed, Disp: 1 each, Rfl: 0    oxyCODONE (ROXICODONE) 10 MG immediate release tablet, Take 1 tablet (10 mg total) by mouth every 4 hours as needed for Pain.  Max daily dose: 6 tablets, Disp: 40 tablet, Rfl: 0  Review of Systems    Constitutional: Negative for activity change, appetite change, diaphoresis, fatigue, fever and unexpected weight change.   Respiratory: Negative for cough, chest tightness, shortness of breath and wheezing.    Cardiovascular: Negative for chest pain, palpitations and leg swelling.   Gastrointestinal: Negative for abdominal distention, abdominal pain, anal bleeding, blood in stool and diarrhea.   Musculoskeletal: Negative for back pain and myalgias.   Skin: Negative for color change.     OBJECTIVE     BP 126/84    Pulse 89    Ht 1.88 m (6\' 2" )    Wt 83.9 kg (185 lb)    SpO2 100%    BMI 23.75 kg/m   Physical Exam   Constitutional: He appears well-developed and well-nourished.   Psychiatric: Judgment and thought content normal.   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    Hg SS disease:  Continue iron replacement therapy in the setting of prolonged blood loss  Reviewed iron studies with Carsten and will repeat today in anticipation of appt with  hematology  Has been taking pain meidcation when severely anemia due to uncontrolled UC - will hold on HU until UC controlled and can reassess and consider at that time    UC:  Continue mesalamine 2400 mg BID   F/u with GI as scheduled  Fecal Claprotectin ordered to reassess status of inflammation and given supplies for home collection    Labs drawn in office  Will continue to target goal of return 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  HIghly concerned about depression and offered engagement of [facility] team but Jedediah would like to just continue f/u every 2 weeks in office with provider to have support and coordination at this time      Lab rec to be sent to [facility] lab on [facility] is expecting a call from [facility] or GI tomorrow (he is  comfortable with [place] talking with his mother if he is not available) when results are back    RD referral initiated for: Ulcerative colitis, new dx    --Patient instructed to call if symptoms are not improving or worsening  --Follow-up arranged    Electronically signed by Benay Pillow, MD 10/29/2017 2:54 PM  [facility], Phone: [phone]

## 2017-11-12 ENCOUNTER — Encounter: Payer: Medicaid Other | Admitting: Primary Care

## 2017-11-12 MED ORDER — VITAMIN D 1000 UNIT PO TABS *A*
4000.0000 [IU] | ORAL_TABLET | Freq: Every day | ORAL | 5 refills | Status: AC
Start: 2017-11-12 — End: 2018-05-11

## 2017-11-13 ENCOUNTER — Other Ambulatory Visit: Payer: Self-pay | Admitting: Primary Care

## 2017-11-13 ENCOUNTER — Ambulatory Visit: Payer: Medicaid Other | Admitting: Primary Care

## 2017-11-13 NOTE — Telephone Encounter (Signed)
Patients mom is calling and states pt is completely out of pain meds and needs his script asap.

## 2017-11-13 NOTE — Telephone Encounter (Signed)
Confidential Drug Report  Search Terms: Cameron BachelorLucius Bessire, 11/25/1995   Search Date: 11/13/2017 02:41:19 PM   Searching on behalf of: [id number] - Tiffany L [name]   The Drug Utilization Report below displays all of the controlled substance prescriptions, if any, that your patient has filled in the last twelve months. The information displayed on this report is compiled from pharmacy submissions to the Department, and accurately reflects the information as submitted by the pharmacies.  This report was requested by: Denton MeekKristie Lenox Ladouceur   Reference #: [id number]   Others' Prescriptions  Patient Name: Cameron [name] Birth Date: 11/25/1995   Address: [address] [place], [place] [id number] Sex: Male   Rx Written Rx Dispensed Drug Quantity Days Supply Prescriber Name Payment Method Dispenser   11/01/2017 11/02/2017 oxycodone hcl 10 mg tablet  40 7 [name], Gwenith Spitziffany L MD [facility]   10/23/2017 10/25/2017 oxycodone hcl 10 mg tablet  40 7 [name], [name] Medicaid [facility]   10/10/2017 10/12/2017 oxycodone hcl 10 mg tablet  40 7 [name], [name] Medicaid [facility]   09/30/2017 10/02/2017 oxycodone hcl 10 mg tablet  40 7 [name], [name] Medicaid [facility]   09/18/2017 09/18/2017 oxycodone hcl 10 mg tablet  40 7 [name], [name] Cash [facility]   09/10/2017 09/10/2017 oxycodone hcl 10 mg tablet  40 7 [name], Gwenith Spitziffany L MD Insurance [facility]   09/02/2017 09/04/2017 oxycodone hcl 10 mg tablet  40 7 [name], [name] Cash [facility]   08/26/2017 08/27/2017 oxycodone hcl 10 mg tablet  40 7 Karle BarrLie, Ariadne G MD Cash [facility]   08/09/2017 08/13/2017 oxycodone hcl 10 mg tablet  40 7 [name], Gwenith Spitziffany L MD Insurance [facility]   08/05/2017 08/06/2017 oxycodone hcl 10 mg tablet  40 7 [name], Gwenith Spitziffany L MD Insurance [facility]   07/02/2017 07/02/2017 oxycodone hcl 10 mg tablet  40 7 [name], Gwenith Spitziffany L MD Insurance [facility]   06/04/2017 06/04/2017 oxycodone hcl 10 mg tablet  40 7 [name], Gwenith Spitziffany L MD Insurance [facility]     Patient Name: Cameron [name] Birth Date: 11/25/1995   Address: [address] [place], [place] [id number] Sex: Male   Rx Written Rx Dispensed Drug Quantity Days Supply Prescriber Name Payment Method Dispenser   04/30/2017 04/30/2017 oxycodone hcl 10 mg tablet  40 7 [name], Gwenith Spitziffany L MD Insurance [facility]   03/29/2017 03/29/2017 oxycodone hcl 10 mg tablet  40 7 [name], Gwenith Spitziffany L MD Insurance [facility]   02/22/2017 02/23/2017 oxycodone hcl 10 mg tablet  40 7 [name], Gwenith Spitziffany L MD Insurance [facility]   01/23/2017 01/23/2017 oxycodone hcl 10 mg tablet  40 7 [name], Gwenith Spitziffany L MD Insurance [facility]   01/01/2017 01/02/2017 oxycodone hcl 10 mg tablet  14 14 [name], Gwenith Spitziffany L MD Insurance [facility]   12/04/2016 12/05/2016 oxycodone hcl 10 mg tablet  30 15 Vista DeckNoronha, Suzie A (MD) Cash [facility]

## 2017-11-14 NOTE — Telephone Encounter (Signed)
Attempted to reach pt to check in  No answer, left VM requesting call back to discuss a few things  Office number provided

## 2017-11-14 NOTE — Telephone Encounter (Signed)
Please notify patient that refill completed to listed pharmacy.  Also routing to nursing to assess how he is doing as use has increased from last refill and this is usually a sign that UC uncontrolled - also still dont have that stool test

## 2017-11-17 NOTE — Progress Notes (Signed)
This encounter was created in error - please disregard.

## 2017-11-25 ENCOUNTER — Other Ambulatory Visit: Payer: Self-pay | Admitting: Primary Care

## 2017-11-25 MED ORDER — OXYCODONE HCL 10 MG PO TABS *I*
ORAL_TABLET | ORAL | 0 refills | Status: DC
Start: 2017-11-25 — End: 2017-12-04

## 2017-11-25 NOTE — Telephone Encounter (Signed)
Search Terms: Cameron [name] [place]bloom, 1996/08/02   Search Date: 11/25/2017 10:15:17 AM   Searching on behalf of: [id number] - Tiffany L [name]   The Drug Utilization Report below displays all of the controlled substance prescriptions, if any, that your patient has filled in the last twelve months. The information displayed on this report is compiled from pharmacy submissions to the Department, and accurately reflects the information as submitted by the pharmacies.  This report was requested by: Tonny BollmanShandell Jacqualin Shirkey   Reference #: [id number]   Others' Prescriptions  Patient Name: Cameron [name] Cieslinski Birth Date: 1996/08/02   Address[address] [place], [place] [id number] Sex: Male   Rx Written Rx Dispensed Drug Quantity Days Supply Prescriber Name Payment Method Dispenser   11/14/2017 11/14/2017 oxycodone hcl 10 mg tablet  40 7 [name], Gwenith Spitziffany L MD [facility]   11/01/2017 11/02/2017 oxycodone hcl 10 mg tablet  40 7 [name], Gwenith Spitziffany L MD Insurance [facility]   10/23/2017 10/25/2017 oxycodone hcl 10 mg tablet  40 7 [name], [name] Medicaid [facility]   10/10/2017 10/12/2017 oxycodone hcl 10 mg tablet  40 7 [name], [name] Medicaid [facility]   09/30/2017 10/02/2017 oxycodone hcl 10 mg tablet  40 7 [name], [name] Medicaid [facility]   09/18/2017 09/18/2017 oxycodone hcl 10 mg tablet  40 7 [name], [name] Cash [facility]   09/10/2017 09/10/2017 oxycodone hcl 10 mg tablet  40 7 [name], Gwenith Spitziffany L MD Insurance [facility]   09/02/2017 09/04/2017 oxycodone hcl 10 mg tablet  40 7 [name], [name] Cash [facility]   08/26/2017 08/27/2017 oxycodone hcl 10 mg tablet  40 7 Karle BarrLie, Ariadne G MD Cash [facility]   08/09/2017 08/13/2017 oxycodone hcl 10 mg tablet  40 7 [name], Gwenith Spitziffany L MD Insurance [facility]   08/05/2017  08/06/2017 oxycodone hcl 10 mg tablet  40 7 [name], Gwenith Spitziffany L MD Insurance [facility]   07/02/2017 07/02/2017 oxycodone hcl 10 mg tablet  40 7 [name], Gwenith Spitziffany L MD Insurance [facility]   06/04/2017 06/04/2017 oxycodone hcl 10 mg tablet  40 7 [name], Gwenith Spitziffany L MD Insurance [facility]     Patient Name: Cameron [name] Najjar Birth Date: 1996/08/02   Address: [address] [place], [place] [id number] Sex: Male   Rx Written Rx Dispensed Drug Quantity Days Supply Prescriber Name Payment Method Dispenser   04/30/2017 04/30/2017 oxycodone hcl 10 mg tablet  40 7 [name], Gwenith Spitziffany L MD Insurance [facility]   03/29/2017 03/29/2017 oxycodone hcl 10 mg tablet  40 7 [name], Gwenith Spitziffany L MD Insurance [facility]   02/22/2017 02/23/2017 oxycodone hcl 10 mg tablet  40 7 [name], Gwenith Spitziffany L MD Insurance [facility]   01/23/2017 01/23/2017 oxycodone hcl 10 mg tablet  40 7 [name], Gwenith Spitziffany L MD Insurance [facility]   01/01/2017 01/02/2017 oxycodone hcl 10 mg tablet  14 14 [name], Gwenith Spitziffany L MD Insurance [facility]   12/04/2016 12/05/2016 oxycodone hcl 10 mg tablet  30 15 Vista DeckNoronha, Suzie A (MD) Cash [facility]   * - Drugs marked with an asterisk are compound drugs. If the compound drug is made up of  more than one controlled substance, then each controlled substance will be a separate row in the table.      Report Suspicious Activity   Send Questions/Comments   Substance Abuse Treatment Information     Click the Report Suspicious Activity button to report information related to controlled substance suspicious activity to the [employer] of Narcotic Enforcement.   Click the Send Questions/Comments button to send questions about this report to the [facility] Division of Narcotic Enforcement, or call [phone].   Click the Substance Abuse Treatment Information button to go  to the Office of Alcoholism and Substance Abuse Services website, www.oasas.[employer].hu or call [phone].    2019 [facility]

## 2017-11-25 NOTE — Telephone Encounter (Signed)
Refill sent but patient has missed f/u with hematology and GI - needs support for scheduling new visits and needs to send stool testing

## 2017-12-04 ENCOUNTER — Other Ambulatory Visit: Payer: Self-pay | Admitting: Primary Care

## 2017-12-04 MED ORDER — OXYCODONE HCL 10 MG PO TABS *I*
ORAL_TABLET | ORAL | 0 refills | Status: DC
Start: 2017-12-04 — End: 2017-12-25

## 2017-12-04 NOTE — Telephone Encounter (Signed)
Search Terms: Jerret Mcbane, 03/11/1996   Search Date: 12/04/2017 09:31:42 AM   Searching on behalf of: [id number] - Tiffany L [name]   The Drug Utilization Report below displays all of the controlled substance prescriptions, if any, that your patient has filled in the last twelve months. The information displayed on this report is compiled from pharmacy submissions to the Department, and accurately reflects the information as submitted by the pharmacies.  This report was requested by: Tonny Bollman   Reference #: [id number]   Others' Prescriptions  Patient Name: Cameron [name] Birth Date: 01/16/1996   Address: [address] [place], [place] [address] Sex: Male   Rx Written Rx Dispensed Drug Quantity Days Supply Prescriber Name Payment Method Dispenser   11/25/2017 11/26/2017 oxycodone hcl 10 mg tablet  40 6 [name], [name] MD [facility]   11/14/2017 11/14/2017 oxycodone hcl 10 mg tablet  40 7 [name], [name] MD Insurance [facility]   11/01/2017 11/02/2017 oxycodone hcl 10 mg tablet  40 7 [name], [name] MD Insurance [facility]   10/23/2017 10/25/2017 oxycodone hcl 10 mg tablet  40 7 [name], [name] Medicaid [facility]   10/10/2017 10/12/2017 oxycodone hcl 10 mg tablet  40 7 [name], [name] Medicaid [facility]   09/30/2017 10/02/2017 oxycodone hcl 10 mg tablet  40 7 [name], [name] Medicaid [facility]   09/18/2017 09/18/2017 oxycodone hcl 10 mg tablet  40 7 [name], [name] Cash [facility]   09/10/2017 09/10/2017 oxycodone hcl 10 mg tablet  40 7 [name], [name] MD Insurance [facility]   09/02/2017 09/04/2017 oxycodone hcl 10 mg tablet  40 7 [name], [name] Cash [facility]   08/26/2017 08/27/2017 oxycodone hcl 10 mg tablet  40 7 Karle Barr MD Cash [facility]   08/09/2017  08/13/2017 oxycodone hcl 10 mg tablet  40 7 [name], [name] MD Insurance [facility]   08/05/2017 08/06/2017 oxycodone hcl 10 mg tablet  40 7 [name], [name] MD Insurance [facility]   07/02/2017 07/02/2017 oxycodone hcl 10 mg tablet  40 7 [name], [name] MD Insurance [facility]   06/04/2017 06/04/2017 oxycodone hcl 10 mg tablet  40 7 [name], [name] MD Insurance [facility]     Patient Name: Cameron [name] Birth Date: 02/24/1996   Address: [address] [place], [place] [address] Sex: Male   Rx Written Rx Dispensed Drug Quantity Days Supply Prescriber Name Payment Method Dispenser   04/30/2017 04/30/2017 oxycodone hcl 10 mg tablet  40 7 [name], [name] MD [facility]   03/29/2017 03/29/2017 oxycodone hcl 10 mg tablet  40 7 [name], [name] MD Insurance [facility]   02/22/2017 02/23/2017 oxycodone hcl 10 mg tablet  40 7 [name], [name] MD Insurance [facility]   01/23/2017 01/23/2017 oxycodone hcl 10 mg tablet  40 7 [name], [name] MD Insurance [facility]   01/01/2017 01/02/2017 oxycodone hcl 10 mg tablet  14 14 [name], [name] MD Insurance [facility]   12/04/2016 12/05/2016 oxycodone hcl 10 mg tablet  30 15 Diannia Ruder (MD) Cash [facility]   * - Drugs marked with an asterisk are compound drugs. If the compound drug is made up of more than one controlled substance, then each controlled substance will be a separate row in the table.      Report Suspicious Activity   Send Questions/Comments   Substance Abuse Treatment Information     Click the Report Suspicious Activity button to report information related to controlled substance suspicious activity to the [employer] of [employer]otic Enforcement.   Click the Send Questions/Comments button to send questions about this report to the  [facility] of Narcotic Enforcement, or call [phone].   Click the Substance Abuse Treatment Information button to go to the Office of Alcoholism and Substance Abuse Services website, www.[place].[employer].[place].gov or call [phone].    2019 [facility]

## 2017-12-04 NOTE — Telephone Encounter (Signed)
Patient has had increased frequency of pain med refill - can you check in with him?

## 2017-12-12 ENCOUNTER — Other Ambulatory Visit: Payer: Self-pay

## 2017-12-12 ENCOUNTER — Other Ambulatory Visit: Payer: Self-pay | Admitting: Primary Care

## 2017-12-12 NOTE — Telephone Encounter (Signed)
Search Terms: Cameron BachelorLucius House, 06-28-1996   Search Date: 12/12/2017 02:34:32 PM   Searching on behalf of: [id number] - Tiffany L [name]   The Drug Utilization Report below displays all of the controlled substance prescriptions, if any, that your patient has filled in the last twelve months. The information displayed on this report is compiled from pharmacy submissions to the Department, and accurately reflects the information as submitted by the pharmacies.  This report was requested by: Tonny BollmanShandell Thaddeus Evitts   Reference #: [id number]   Others' Prescriptions  Patient Name: Cameron [name] Birth Date: 06-28-1996   Address[place] [address] [place], [place] [id number] Sex: Male   Rx Written Rx Dispensed Drug Quantity Days Supply Prescriber Name Payment Method Dispenser   11/25/2017 11/26/2017 oxycodone hcl 10 mg tablet  40 6 [name], Gwenith Spitziffany L MD [facility]   11/14/2017 11/14/2017 oxycodone hcl 10 mg tablet  40 7 [name], Gwenith Spitziffany L MD [facility]   11/01/2017 11/02/2017 oxycodone hcl 10 mg tablet  40 7 [name], Gwenith Spitziffany L MD [facility]   10/23/2017 10/25/2017 oxycodone hcl 10 mg tablet  40 7 [name], [name] Medicaid [facility]   10/10/2017 10/12/2017 oxycodone hcl 10 mg tablet  40 7 [name], [name] Medicaid [facility]   09/30/2017 10/02/2017 oxycodone hcl 10 mg tablet  40 7 [name], [name] Medicaid [facility]   09/18/2017 09/18/2017 oxycodone hcl 10 mg tablet  40 7 [name], [name] Cash [facility]   09/10/2017 09/10/2017 oxycodone hcl 10 mg tablet  40 7 [name], Gwenith Spitziffany L MD Insurance [facility]   09/02/2017 09/04/2017 oxycodone hcl 10 mg tablet  40 7 [name], [name] Cash [facility]   08/26/2017 08/27/2017 oxycodone hcl 10 mg tablet  40 7 Karle BarrLie, Ariadne G MD Cash [facility]   08/09/2017  08/13/2017 oxycodone hcl 10 mg tablet  40 7 [name], Gwenith Spitziffany L MD Insurance [facility]   08/05/2017 08/06/2017 oxycodone hcl 10 mg tablet  40 7 [name], Gwenith Spitziffany L MD Insurance [facility]   07/02/2017 07/02/2017 oxycodone hcl 10 mg tablet  40 7 [name], Gwenith Spitziffany L MD [facility]   06/04/2017 06/04/2017 oxycodone hcl 10 mg tablet  40 7 [name], Gwenith Spitziffany L MD [facility]     Patient Name: Cameron [name] Birth Date: 06-28-1996   Address: [address] [place], [place] [id number] Sex: Male   Rx Written Rx Dispensed Drug Quantity Days Supply Prescriber Name Payment Method Dispenser   04/30/2017 04/30/2017 oxycodone hcl 10 mg tablet  40 7 [name], Gwenith Spitziffany L MD [facility]   03/29/2017 03/29/2017 oxycodone hcl 10 mg tablet  40 7 [name], Gwenith Spitziffany L MD [facility]   02/22/2017 02/23/2017 oxycodone hcl 10 mg tablet  40 7 [name], Gwenith Spitziffany L MD Insurance [facility]   01/23/2017 01/23/2017 oxycodone hcl 10 mg tablet  40 7 [name], Gwenith Spitziffany L MD [facility]   01/01/2017 01/02/2017 oxycodone hcl 10 mg tablet  14 14 [name], [name] [facility]   * - Drugs marked with an asterisk are compound drugs. If the compound drug is made up of  more than one controlled substance, then each controlled substance will be a separate row in the table.      Report Suspicious Activity   Send Questions/Comments   Substance Abuse Treatment Information     Click the Report Suspicious Activity button to report information related to controlled substance suspicious activity to the [employer] of Narcotic Enforcement.   Click the Send Questions/Comments button to send questions about this report to the [facility] of Narcotic Enforcement, or call [phone].   Click the Substance Abuse Treatment Information button to  go to the Office of Alcoholism and Substance Abuse Services website, www.oasas.[employer]uelStrike.hu or call ([phone].

## 2017-12-12 NOTE — Telephone Encounter (Signed)
asdfasdfasdf

## 2017-12-13 ENCOUNTER — Other Ambulatory Visit: Payer: Self-pay | Admitting: Primary Care

## 2017-12-13 NOTE — Telephone Encounter (Signed)
Confidential Drug Report  Search Terms: Cameron BachelorLucius Wellborn, 04/17/1996   Search Date: 12/13/2017 05:03:18 PM   Searching on behalf of: [id number] - Tiffany L [name]   The Drug Utilization Report below displays all of the controlled substance prescriptions, if any, that your patient has filled in the last twelve months. The information displayed on this report is compiled from pharmacy submissions to the Department, and accurately reflects the information as submitted by the pharmacies.  This report was requested by: Denton MeekKristie Raydel Hosick   Reference #: [id number]   Others' Prescriptions  Patient Name: Cameron [name] Birth Date: 04/17/1996   Address: [address] [place], [place] [id number] Sex: Male   Rx Written Rx Dispensed Drug Quantity Days Supply Prescriber Name Payment Method Dispenser   11/25/2017 11/26/2017 oxycodone hcl 10 mg tablet  40 6 [name], Gwenith Spitziffany L MD [facility]   11/14/2017 11/14/2017 oxycodone hcl 10 mg tablet  40 7 [name], Gwenith Spitziffany L MD Insurance [facility]   11/01/2017 11/02/2017 oxycodone hcl 10 mg tablet  40 7 [name], Gwenith Spitziffany L MD Insurance [facility]   10/23/2017 10/25/2017 oxycodone hcl 10 mg tablet  40 7 [name], [name] Medicaid [facility]   10/10/2017 10/12/2017 oxycodone hcl 10 mg tablet  40 7 [name], [name] Medicaid [facility]   09/30/2017 10/02/2017 oxycodone hcl 10 mg tablet  40 7 [name], [name] Medicaid [facility]   09/18/2017 09/18/2017 oxycodone hcl 10 mg tablet  40 7 [name], [name] Cash [facility]   09/10/2017 09/10/2017 oxycodone hcl 10 mg tablet  40 7 [name], Gwenith Spitziffany L MD Insurance [facility]   09/02/2017 09/04/2017 oxycodone hcl 10 mg tablet  40 7 [name], [name] Cash [facility]   08/26/2017 08/27/2017 oxycodone hcl 10 mg tablet  40 7 Karle BarrLie, Ariadne G MD Cash [facility]   08/09/2017 08/13/2017 oxycodone hcl 10 mg tablet  40 7 [name], Gwenith Spitziffany L MD Insurance [facility]   08/05/2017 08/06/2017 oxycodone hcl 10 mg tablet  40 7 [name], Gwenith Spitziffany L MD Insurance [facility]   07/02/2017 07/02/2017 oxycodone hcl 10 mg tablet  40 7 [name], Gwenith Spitziffany L MD Insurance [facility]   06/04/2017 06/04/2017 oxycodone hcl 10 mg tablet  40 7 [name], Gwenith Spitziffany L MD Insurance [facility]     Patient Name: Cameron [name] Birth Date: 04/17/1996   Address: [address] [place], [place] [id number] Sex: Male   Rx Written Rx Dispensed Drug Quantity Days Supply Prescriber Name Payment Method Dispenser   04/30/2017 04/30/2017 oxycodone hcl 10 mg tablet  40 7 [name], Gwenith Spitziffany L MD Insurance [facility]   03/29/2017 03/29/2017 oxycodone hcl 10 mg tablet  40 7 [name], Gwenith Spitziffany L MD Insurance [facility]   02/22/2017 02/23/2017 oxycodone hcl 10 mg tablet  40 7 [name], Gwenith Spitziffany L MD Insurance [facility]   01/23/2017 01/23/2017 oxycodone hcl 10 mg tablet  40 7 [name], Gwenith Spitziffany L MD Insurance [facility]   01/01/2017 01/02/2017 oxycodone hcl 10 mg tablet  14 14 [name], [name] [facility]

## 2017-12-18 ENCOUNTER — Telehealth: Payer: Self-pay | Admitting: Primary Care

## 2017-12-18 NOTE — Telephone Encounter (Signed)
Declined patient's request for refill of medication - we have attempted to reach him for more than a month to assess his barriers to care after missed appointment siwht myself, GI, and hematology.  He needs engagement from all of these providers to address his co diagnosis of SCD and ulcerative colitis   Can nursing and CM regroup and make plan to outreach most effectively?  Happy to see in office anytime  Thank you

## 2017-12-18 NOTE — Telephone Encounter (Signed)
Velna Ochsracy Jameson called to inquire about patients Oxycodone prescription that needs to be refilled as soon as possible.

## 2017-12-19 NOTE — Telephone Encounter (Signed)
Spoke to [employer] [facility] [employer]   Pre Tracy, pt is completely out of Oxycodone   Pre French Anaracy, she feels pt should get a two week supply so they wont have to call so much for a refill   Per French Anaracy, she would like the medication to be refilled as soon as possible

## 2017-12-19 NOTE — Telephone Encounter (Signed)
CM called patient, his number is disconnected. Patient listed his mother Cameron [name] on the 5055. I called his mother to inform her that patient needs to come to his appointment on 12/25/2017 to see Dr. Concha NorwayPulcino . CM informed patient's mother about the declined request for his medication, [facility]. Mother stated that she will make sure that patient gets to his appointment on 12/25/2017.

## 2017-12-19 NOTE — Telephone Encounter (Signed)
See below

## 2017-12-25 ENCOUNTER — Ambulatory Visit: Payer: Medicaid Other | Attending: Primary Care | Admitting: Primary Care

## 2017-12-25 ENCOUNTER — Encounter: Payer: Self-pay | Admitting: Primary Care

## 2017-12-25 VITALS — BP 126/84 | HR 84 | Ht 74.0 in | Wt 194.0 lb

## 2017-12-25 DIAGNOSIS — K5190 Ulcerative colitis, unspecified, without complications: Secondary | ICD-10-CM | POA: Insufficient documentation

## 2017-12-25 DIAGNOSIS — D571 Sickle-cell disease without crisis: Secondary | ICD-10-CM

## 2017-12-25 LAB — CBC AND DIFFERENTIAL
Baso # K/uL: 0 10*3/uL (ref 0.0–0.1)
Basophil %: 0.6 %
Eos # K/uL: 0.2 10*3/uL (ref 0.0–0.5)
Eosinophil %: 4.7 %
Hematocrit: 22 % — ABNORMAL LOW (ref 40–51)
Hemoglobin: 5.8 g/dL — ABNORMAL LOW (ref 13.7–17.5)
IMM Granulocytes #: 0 10*3/uL
IMM Granulocytes: 0.4 %
Lymph # K/uL: 1.6 10*3/uL (ref 1.3–3.6)
Lymphocyte %: 32.2 %
MCH: 16 pg/cell — ABNORMAL LOW (ref 26–32)
MCHC: 26 g/dL — ABNORMAL LOW (ref 32–37)
MCV: 60 fL — ABNORMAL LOW (ref 79–92)
Mono # K/uL: 0.6 10*3/uL (ref 0.3–0.8)
Monocyte %: 12.7 %
Neut # K/uL: 2.4 10*3/uL (ref 1.8–5.4)
Nucl RBC # K/uL: 0 10*3/uL (ref 0.0–0.0)
Nucl RBC %: 0 /100 WBC (ref 0.0–0.2)
Platelets: 293 10*3/uL (ref 150–330)
RBC: 3.7 MIL/uL — ABNORMAL LOW (ref 4.6–6.1)
RDW: 20.2 % — ABNORMAL HIGH (ref 11.6–14.4)
Seg Neut %: 49.4 %
WBC: 4.9 10*3/uL (ref 4.2–9.1)

## 2017-12-25 LAB — TIBC
Iron: 23 ug/dL — ABNORMAL LOW (ref 45–170)
TIBC: 404 ug/dL (ref 250–450)
Transferrin Saturation: 6 % — ABNORMAL LOW (ref 20–55)

## 2017-12-25 LAB — FERRITIN: Ferritin: 6 ng/mL — ABNORMAL LOW (ref 20–250)

## 2017-12-25 MED ORDER — FOLIC ACID 1 MG PO TABS *I*
1.0000 mg | ORAL_TABLET | Freq: Every day | ORAL | 5 refills | Status: DC
Start: 2017-12-25 — End: 2018-03-25

## 2017-12-25 MED ORDER — MESALAMINE 800 MG PO TBEC *I*
2400.0000 mg | DELAYED_RELEASE_TABLET | Freq: Two times a day (BID) | ORAL | 2 refills | Status: DC
Start: 2017-12-25 — End: 2018-02-25

## 2017-12-25 MED ORDER — OXYCODONE HCL 10 MG PO TABS *I*
ORAL_TABLET | ORAL | 0 refills | Status: DC
Start: 2017-12-25 — End: 2018-01-07

## 2017-12-25 NOTE — Progress Notes (Signed)
This report was requested by: Laruth BouchardJennifer Kyden Potash   Reference #: [id number]   Others' Prescriptions  Patient Name: Cameron [name] Birth Date: 1995-12-10   Address: [address] [place], [place] [id number] Sex: Male   Rx Written Rx Dispensed Drug Quantity Days Supply Prescriber Name Payment Method Dispenser   12/04/2017 12/05/2017 oxycodone hcl 10 mg tablet  40 7 [name], Gwenith Spitziffany L MD [facility]   11/25/2017 11/26/2017 oxycodone hcl 10 mg tablet  40 6 [name], Gwenith Spitziffany L MD Insurance [facility]   11/14/2017 11/14/2017 oxycodone hcl 10 mg tablet  40 7 [name], Gwenith Spitziffany L MD Insurance [facility]   11/01/2017 11/02/2017 oxycodone hcl 10 mg tablet  40 7 [name], Gwenith Spitziffany L MD Insurance [facility]   10/23/2017 10/25/2017 oxycodone hcl 10 mg tablet  40 7 [name], [name] Medicaid [facility]   10/10/2017 10/12/2017 oxycodone hcl 10 mg tablet  40 7 [name], [name] Medicaid [facility]   09/30/2017 10/02/2017 oxycodone hcl 10 mg tablet  40 7 [name], [name] Medicaid [facility]   09/18/2017 09/18/2017 oxycodone hcl 10 mg tablet  40 7 [name], [name] Cash [facility]   09/10/2017 09/10/2017 oxycodone hcl 10 mg tablet  40 7 [name], Gwenith Spitziffany L MD Insurance [facility]   09/02/2017 09/04/2017 oxycodone hcl 10 mg tablet  40 7 [name], [name] Cash [facility]   08/26/2017 08/27/2017 oxycodone hcl 10 mg tablet  40 7 Karle BarrLie, Ariadne G MD Cash [facility]   08/09/2017 08/13/2017 oxycodone hcl 10 mg tablet  40 7 [name], Gwenith Spitziffany L MD Insurance [facility]   08/05/2017 08/06/2017 oxycodone hcl 10 mg tablet  40 7 [name], Gwenith Spitziffany L MD Insurance [facility]   07/02/2017 07/02/2017 oxycodone hcl 10 mg tablet  40 7 [name], Gwenith Spitziffany L MD [facility]   06/04/2017 06/04/2017 oxycodone hcl 10 mg tablet  40 7 [name], Gwenith Spitziffany L MD Insurance [facility]     Patient Name: Cameron [name] Birth Date: 1995-12-10   Address: [address] [place], [place] [id number] Sex: Male   Rx Written Rx Dispensed Drug Quantity Days Supply Prescriber Name Payment Method Dispenser   04/30/2017 04/30/2017 oxycodone hcl 10 mg tablet  40 7 [name], Gwenith Spitziffany L MD [facility]   03/29/2017 03/29/2017 oxycodone hcl 10 mg tablet  40 7 [name], Gwenith Spitziffany L MD Insurance [facility]   02/22/2017 02/23/2017 oxycodone hcl 10 mg tablet  40 7 [name], Gwenith Spitziffany L MD Insurance [facility]   01/23/2017 01/23/2017 oxycodone hcl 10 mg tablet  40 7 [name], Gwenith Spitziffany L MD Insurance [facility]   01/01/2017 01/02/2017 oxycodone hcl 10 mg tablet  14 14 [name], [name] [facility]

## 2017-12-25 NOTE — Progress Notes (Signed)
Behavioral Health Consult     Service Location:  [place] provider to check in at regular appointments.     Consult Details  Patient Contact:  Yes  Medication Involved:  No  Visit Length:  5  Reason for Behavioral Health Consultation:  Check-in    Brief Assesment:         Writer met with patient and re-introduced the role of [facility] providers and services available at the [facility]. The patient stated that he is not interested in structured therapy but he is open to regular check-ins when he comes in for medical appointments.       Current Medications:  Current Outpatient Prescriptions   Medication Sig    folic acid (FOLVITE) 1 MG tablet Take 1 tablet (1 mg total) by mouth daily    mesalamine (ASACOL HD) 800 MG tablet Take 3 tablets (2,400 mg total) by mouth 2 times daily    oxyCODONE (ROXICODONE) 10 MG immediate release tablet Take 1 tablet (10 mg total) by mouth every 4 hours as needed for Pain.  Max daily dose: 6 tablets    cholecalciferol (VITAMIN D) 1000 UNIT tablet Take 4 tablets (4,000 Units total) by mouth daily    ferrous sulfate 325 (65 FE) MG tablet Take 1 tablet (325 mg total) by mouth 2 times daily (with meals)    clindamycin-benzoyl peroxide (BENZACLIN) gel Apply topically 2 times daily    Electronic Thermometer (DIGITAL THERMOMETER) MISC Use as directed    naloxone (NARCAN) 4 mg/0.1 mL nasal spray Instill 1 spray in 1 nostril once for opioid reversal. Repeat in alternating nostrils with new package every 2-3 minutes until response     No current facility-administered medications for this visit.        Allergies:  No Known Allergies (drug, envir, food or latex)    Last PHQ  PHQ-9 Total Score: 0 (01/01/2017  2:15 PM)     Last Mental Status  Mental Status Exam:   Appearance:  Appears stated age  Orientation:  Alert and oriented X 3  Attitude Toward Interviewer:  Cooperative  Motor Activity:  Normal  Eye Contact:  Limited  Speech:  Normal  Mood: euthymic.  Affect:  Full  range  Thought Process:  Logical  Thought Content:  Appropriate  Perception:  Normal  Current Suicidal Ideation:  Patient denies  Current Homicidal Ideation:  Patient denies    Working Diagnosis:    ICD-10-CM ICD-9-CM   1. Sickle cell disease D57.1 282.60   2. Ulcerative colitis K51.90 556.9     [place], [place]

## 2017-12-25 NOTE — Progress Notes (Addendum)
[facility]  [address]  [place] [address]  Phone: [phone]  Fax: [phone]    REASON FOR VISIT     Follow up for complex situation involving new ulcerative colitis diagnosis in the setting of chronic HgSS.    PRIMARY DIAGNOSIS     HgSS with HPFH, Ulcerative Colitis     SUBJECTIVE     Ulcerative Colitis  -Has not had abdominal pain, no bloody BMs  -Having only 2 BMs per day  -Still pretty soft and liquidy  -Taking mesalamine 2400mg  BID.  Never misses the morning dose, but sometimes misses the evening dose    HgSS  -Has been having b/l low back pain since switching jobs to [employer]  -Had pain this past Sunday after playing [facility] at [employer] and used oxycodone for his back pain, and it resolved  -Thinks he using oxycodone 4-5 times per week for his back pain, never more than once per day, never more than 1 tab at a time  -Energy level has been good, and he is playing basket ball several times per week ([employer])    Social  -Plans to return to [employer] in August, but has a lot of anxiety about going to [place] because he is worried about his UC flaring up    Past Medical History, Surgical History, Social History, Family History, and Medications/allergies reviewed during this visit    Current Outpatient Prescriptions   Medication    folic acid (FOLVITE) 1 MG tablet    mesalamine (ASACOL HD) 800 MG tablet    oxyCODONE (ROXICODONE) 10 MG immediate release tablet    cholecalciferol (VITAMIN D) 1000 UNIT tablet    ferrous sulfate 325 (65 FE) MG tablet    clindamycin-benzoyl peroxide (BENZACLIN) gel    Electronic Thermometer (DIGITAL THERMOMETER) MISC    naloxone (NARCAN) 4 mg/0.1 mL nasal spray     No current facility-administered medications for this visit.        Review of Systems as per HPI above      OBJECTIVE     BP 126/84    Pulse 84    Ht 1.88 m (6\' 2" )    Wt 88 kg (194 lb)    SpO2 100%    BMI 24.91 kg/m       Physical Exam    Constitutional: He is oriented to person, place, and time and well-developed, well-nourished, and in no distress.   HENT:   Head: Normocephalic.   Right Ear: External ear normal.   Left Ear: External ear normal.   Eyes: Pupils are equal, round, and reactive to light.   Neck: Normal range of motion. Neck supple.   Cardiovascular: Normal rate and normal heart sounds.    No murmur heard.  Pulmonary/Chest: Effort normal and breath sounds normal. No respiratory distress. He has no wheezes. He has no rales.   Abdominal: Soft. Bowel sounds are normal. He exhibits no distension. There is no tenderness.   Musculoskeletal: Normal range of motion. He exhibits no tenderness.   Neurological: He is alert and oriented to person, place, and time. Gait normal. Coordination normal.   Skin: Skin is warm and dry.         ASSESSMENT / DIAGNOSIS     Sickle Cell Disease  -Appears to be doing well.  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    Electronically signed by Nelwyn Salisbury, MD , 12/25/2017 @   [facility], Phone: [phone]    Attending Cosignature   I was present for patient visit and agree with  assessment and plan as outlined.  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  We worked together during the visit to sign him up for my chart to help him see appointments and downloaded the app on his phone. Labs drawn in office and stool collection materials provided.    Benay Pillow, MD  [facility]

## 2017-12-30 LAB — FECAL LACTOFERRIN ANTIGEN

## 2017-12-31 ENCOUNTER — Encounter: Payer: Self-pay | Admitting: Primary Care

## 2017-12-31 ENCOUNTER — Other Ambulatory Visit: Payer: Self-pay | Admitting: Primary Care

## 2017-12-31 ENCOUNTER — Telehealth: Payer: Self-pay | Admitting: Primary Care

## 2017-12-31 NOTE — Telephone Encounter (Signed)
Received the following mychart from pt  Will route to CM to contact pt         Goodmorning,     I just wanted to let you know that I am moving to [place] soon & I would like to meet with you as soon as possible to talk about how i could get everything transferred over such as medications, my doctors etc .

## 2018-01-01 NOTE — Telephone Encounter (Signed)
Have been trying ot reach patient in response to his my chart message about plans to move at the end of this month an dhave been unable to reach by phone to change appointment - when calling to inform of refill please assess his request for an appointment

## 2018-01-01 NOTE — Telephone Encounter (Signed)
Search Terms: Zayyan Mullen, 1996-07-19   Search Date: 01/01/2018 01:12:36 PM   Searching on behalf of: [id number] - Tiffany L [name]   The Drug Utilization Report below displays all of the controlled substance prescriptions, if any, that your patient has filled in the last twelve months. The information displayed on this report is compiled from pharmacy submissions to the Department, and accurately reflects the information as submitted by the pharmacies.  This report was requested by: Tonny Bollman   Reference #: [id number]   Others' Prescriptions  Patient Name: Cameron [name] Birth Date: 1995/12/06   Address: [address] [place], [place] [address] Sex: Male   Rx Written Rx Dispensed Drug Quantity Days Supply Prescriber Name Payment Method Dispenser   12/25/2017 12/25/2017 oxycodone hcl 10 mg tablet  40 7 [name], [name] MD Insurance [facility]   12/04/2017 12/05/2017 oxycodone hcl 10 mg tablet  40 7 [name], [name] MD Insurance [facility]   11/25/2017 11/26/2017 oxycodone hcl 10 mg tablet  40 6 [name], [name] MD Insurance [facility]   11/14/2017 11/14/2017 oxycodone hcl 10 mg tablet  40 7 [name], [name] MD Insurance [facility]   11/01/2017 11/02/2017 oxycodone hcl 10 mg tablet  40 7 [name], [name] MD Insurance [facility]   10/23/2017 10/25/2017 oxycodone hcl 10 mg tablet  40 7 [name], [name] Medicaid [facility]   10/10/2017 10/12/2017 oxycodone hcl 10 mg tablet  40 7 [name], [name] Medicaid [facility]   09/30/2017 10/02/2017 oxycodone hcl 10 mg tablet  40 7 [name], [name] Medicaid [facility]   09/18/2017 09/18/2017 oxycodone hcl 10 mg tablet  40 7 [name], [name] Cash [facility]   09/10/2017 09/10/2017 oxycodone hcl 10 mg tablet  40 7 [name], [name] MD Insurance [facility]    09/02/2017 09/04/2017 oxycodone hcl 10 mg tablet  40 7 [name], [name] Cash [facility]   08/26/2017 08/27/2017 oxycodone hcl 10 mg tablet  40 7 Karle Barr MD Cash [facility]   08/09/2017 08/13/2017 oxycodone hcl 10 mg tablet  40 7 [name], [name] MD Insurance [facility]   08/05/2017 08/06/2017 oxycodone hcl 10 mg tablet  40 7 [name], [name] MD Insurance [facility]   07/02/2017 07/02/2017 oxycodone hcl 10 mg tablet  40 7 [name], [name] MD Insurance [facility]   06/04/2017 06/04/2017 oxycodone hcl 10 mg tablet  40 7 [name], [name] MD Insurance [facility]     Patient Name: Cameron [name] Birth Date: 02-Nov-1995   Address: [address] [place], [place] [address] Sex: Male   Rx Written Rx Dispensed Drug Quantity Days Supply Prescriber Name Payment Method Dispenser   04/30/2017 04/30/2017 oxycodone hcl 10 mg tablet  40 7 [name], [name] MD Insurance [facility]   03/29/2017 03/29/2017 oxycodone hcl 10 mg tablet  40 7 [name], [name] MD Insurance [facility]   02/22/2017 02/23/2017 oxycodone hcl 10 mg tablet  40 7 [name], [name] MD Insurance [facility]   01/23/2017 01/23/2017 oxycodone hcl 10 mg tablet  40 7 [name], [name] Insurance [facility]   01/01/2017 01/02/2017 oxycodone hcl 10 mg tablet  14 14 [name], [name] Insurance [facility]   * - Drugs marked with an asterisk are compound drugs. If the compound drug is made up of more than one controlled substance, then each controlled substance will be a separate row in the table.      Report Suspicious Activity   Send Questions/Comments   Substance Abuse Treatment Information     Click the Report Suspicious Activity button to report information related to controlled substance suspicious activity to the  [employer] of Narcotic Enforcement.   Click the Send Questions/Comments button to send questions about this report to the [facility] of Narcotic Enforcement, or call [phone].   Click the Substance Abuse Treatment Information button to go to the Office of Alcoholism and Substance Abuse Services website, www.[place].[employer].huny.gov or call 864-038-23491-8135353891.    2019 [facility]

## 2018-01-02 NOTE — Telephone Encounter (Signed)
VM left for pt to call our office to schedule an appt asap.

## 2018-01-07 ENCOUNTER — Other Ambulatory Visit: Payer: Self-pay | Admitting: Primary Care

## 2018-01-07 ENCOUNTER — Ambulatory Visit: Payer: Medicaid Other | Attending: Social Worker | Admitting: Social Worker

## 2018-01-07 NOTE — Progress Notes (Signed)
Initial Assessment Note    Service Location:  [facility]     Plan  Patient is open to [facility] at his future appointments.      Reason for Behavioral Health Consultation:  Evaluate for level of care  Referral Source:  Jannet MantisULCINO, TIFFANY [facility]  Patient Concerns:  Patient was not expecting to have a [facility], as he had [facility] that he was not interested in [facility] at this time. Patient agreed, nonetheless, to remain for the assessment today. Patient stated that he had struggled with the new diagnosis of IBD, particularly adapting to the frequent trips to the bathroom and the changes in his diet. Patient stated that he is doing much better now and has no reported stressors related to his illness at this time. Patient works at [employer] part-time and is attending [employer] studying Sports Mgmt with the intention of transferring to [place] in the Fall. 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 Patient admits that he does intend to remain close enough to [place] to stay connected with his family, however. Patient talks with his mother when he needs to talk to someone, and he handles stress by napping or watching TV. Patient does not endorse feeling stress or other symptoms at this time.       Treatment History  Current Stressors: Medical Illness    Current Stressors: no financial problem, no medication problem, no employment issues, no traumatic events experienced, no family stress or anxiety and food secure household      Patient and Family History  Lives with:  With family  Functional Assessment:  Mild interference    Family and Social History  Education:  Some college  Ethnicity/Race:  African American  Employment:  Part time employment  Relationship Status: single      Warehouse managerTreatment Plan  Clinical Services: Return to care as usual with PCP      Current  Medications:  Current Outpatient Prescriptions   Medication Sig    folic acid (FOLVITE) 1 MG tablet Take 1 tablet (1 mg total) by mouth daily    mesalamine (ASACOL HD) 800 MG tablet Take 3 tablets (2,400 mg total) by mouth 2 times daily    oxyCODONE (ROXICODONE) 10 MG immediate release tablet Take 1 tablet (10 mg total) by mouth every 4 hours as needed for Pain.  Max daily dose: 6 tablets    cholecalciferol (VITAMIN D) 1000 UNIT tablet Take 4 tablets (4,000 Units total) by mouth daily    ferrous sulfate 325 (65 FE) MG tablet Take 1 tablet (325 mg total) by mouth 2 times daily (with meals)    clindamycin-benzoyl peroxide (BENZACLIN) gel Apply topically 2 times daily    naloxone (NARCAN) 4 mg/0.1 mL nasal spray Instill 1 spray in 1 nostril once for opioid reversal. Repeat in alternating nostrils with new package every 2-3 minutes until response    Electronic Thermometer (DIGITAL THERMOMETER) MISC Use as directed     No current facility-administered medications for this visit.      Allergies:  No Known Allergies (drug, envir, food or latex)    Last PHQ-9:  Recent Review Flowsheet Data     PHQ Scores 01/07/2018 01/01/2017    PSQ2 Q1 - Interest/Pleasure N -    PSQ2 Q2 - Down, Depressed, Hopeless N -    PHQ Calculated Score -  0        Last GAD-7:   GAD-7 Dates 01/07/2018   Total Score 0     Last Mental Status  Mental Status Exam:   Appearance:  Appears stated age  Orientation:  Alert and oriented X 3  Attitude Toward Interviewer:  Cooperative and friendly  Motor Activity:  Normal  Eye Contact:  Good  Speech:  Normal  Mood: euthymic.  Affect:  Flat  Thought Process:  Logical  Thought Content:  Appropriate  Perception:  Normal  Current Suicidal Ideation:  Patient denies  Current Homicidal Ideation:  Patient denies  Concentration:  Good  Memory:  Recent memory intact and remote memory intact  Fund of Knowledge:  Average  Judgment:  Good  Insight:  Good    Formulation of Diagnosis:  Patient had difficulty adjusting to a  new diagnosis several months ago but has since adapted.    Working Diagnosis:  No diagnosis found.    Ines Bloomer, LCSW

## 2018-01-07 NOTE — Telephone Encounter (Signed)
Confidential Drug Report  Search Terms: Cameron BachelorLucius Soileau, 1996-01-26   Search Date: 01/07/2018 02:42:15 PM   Searching on behalf of: [id number] - Tiffany L [name]   The Drug Utilization Report below displays all of the controlled substance prescriptions, if any, that your patient has filled in the last twelve months. The information displayed on this report is compiled from pharmacy submissions to the Department, and accurately reflects the information as submitted by the pharmacies.  This report was requested by: Denton MeekKristie Kenzly Rogoff   Reference #: [id number]   Others' Prescriptions  Patient Name: Cameron [name] Birth Date: 1996-01-26   Address: [address] [place], [place] [id number] Sex: Male   Rx Written Rx Dispensed Drug Quantity Days Supply Prescriber Name Payment Method Dispenser   12/25/2017 12/25/2017 oxycodone hcl 10 mg tablet  40 7 [name], Gwenith Spitziffany L MD [facility]   12/04/2017 12/05/2017 oxycodone hcl 10 mg tablet  40 7 [name], Gwenith Spitziffany L MD [facility]   11/25/2017 11/26/2017 oxycodone hcl 10 mg tablet  40 6 [name], Gwenith Spitziffany L MD [facility]   11/14/2017 11/14/2017 oxycodone hcl 10 mg tablet  40 7 [name], Gwenith Spitziffany L MD [facility]   11/01/2017 11/02/2017 oxycodone hcl 10 mg tablet  40 7 [name], Gwenith Spitziffany L MD Insurance [facility]   10/23/2017 10/25/2017 oxycodone hcl 10 mg tablet  40 7 [name], [name] Medicaid [facility]   10/10/2017 10/12/2017 oxycodone hcl 10 mg tablet  40 7 [name], [name] Medicaid [facility]   09/30/2017 10/02/2017 oxycodone hcl 10 mg tablet  40 7 [name], [name] Medicaid [facility]   09/18/2017 09/18/2017 oxycodone hcl 10 mg tablet  40 7 [name], [name] Cash [facility]   09/10/2017 09/10/2017 oxycodone hcl 10 mg tablet  40 7 [name], Gwenith Spitziffany L MD Insurance [facility]   09/02/2017 09/04/2017 oxycodone hcl 10 mg tablet  40 7 [name], [name] Cash [facility]   08/26/2017 08/27/2017 oxycodone hcl 10 mg tablet  40 7 Karle BarrLie, Ariadne G MD Cash [facility]   08/09/2017 08/13/2017 oxycodone hcl 10 mg tablet  40 7 [name], Gwenith Spitziffany L MD Insurance [facility]   08/05/2017 08/06/2017 oxycodone hcl 10 mg tablet  40 7 [name], Gwenith Spitziffany L MD Insurance [facility]   07/02/2017 07/02/2017 oxycodone hcl 10 mg tablet  40 7 [name], Gwenith Spitziffany L MD Insurance [facility]   06/04/2017 06/04/2017 oxycodone hcl 10 mg tablet  40 7 [name], Gwenith Spitziffany L MD [facility]     Patient Name: Cameron [name] Birth Date: 1996-01-26   Address: [address] [place], [place] [id number] Sex: Male   Rx Written Rx Dispensed Drug Quantity Days Supply Prescriber Name Payment Method Dispenser   04/30/2017 04/30/2017 oxycodone hcl 10 mg tablet  40 7 [name], Gwenith Spitziffany L MD Insurance [facility]   03/29/2017 03/29/2017 oxycodone hcl 10 mg tablet  40 7 [name], Gwenith Spitziffany L MD Insurance [facility]   02/22/2017 02/23/2017 oxycodone hcl 10 mg tablet  40 7 [name], Gwenith Spitziffany L MD [facility]   01/23/2017 01/23/2017 oxycodone hcl 10 mg tablet  40 7 [name], [name]  Insurance [facility]

## 2018-01-07 NOTE — Progress Notes (Signed)
Cameron [name] presents today with No chief complaint on file.   with Riley Nearing, LCSW.  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 - none  BARRIERS ADHERING TO TREATMENT PLAN - Patient identified no barriers to adherence, patient notes that he takes all of his morning meds daily but has difficulty remembering afternoon/evening doses. Discussed patient using a pill box that he carries with him and setting a timer on his phone to take his meds.     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   Current MH Treatment: No  Past MH Treatment: No  Current mediations:   Current Outpatient Prescriptions:     folic acid (FOLVITE) 1 MG tablet, Take 1 tablet (1 mg total) by mouth daily, Disp: 30 tablet, Rfl: 5    mesalamine (ASACOL HD) 800 MG tablet, Take 3 tablets (2,400 mg total) by mouth 2 times daily, Disp: 180  tablet, Rfl: 2    oxyCODONE (ROXICODONE) 10 MG immediate release tablet, Take 1 tablet (10 mg total) by mouth every 4 hours as needed for Pain.  Max daily dose: 6 tablets, Disp: 40 tablet, Rfl: 0    cholecalciferol (VITAMIN D) 1000 UNIT tablet, Take 4 tablets (4,000 Units total) by mouth daily, Disp: 120 tablet, Rfl: 5    ferrous sulfate 325 (65 FE) MG tablet, Take 1 tablet (325 mg total) by mouth 2 times daily (with meals), Disp: 100 tablet, Rfl: 3    clindamycin-benzoyl peroxide (BENZACLIN) gel, Apply topically 2 times daily, Disp: 25 g, Rfl: 5    naloxone (NARCAN) 4 mg/0.1 mL nasal spray, Instill 1 spray in 1 nostril once for opioid reversal. Repeat in alternating nostrils with new package every 2-3 minutes until response, Disp: 2 each, Rfl: 5    Electronic Thermometer (DIGITAL THERMOMETER) MISC, Use as directed, Disp: 1 each, Rfl: 0    Mental Status Exam:      Recent Screening Results:  PHQ-9:   PHQ 01/07/2018   Over the last two weeks, have you often been bothered by feeling down, depressed or hopeless? N   Over the last two weeks, have you often had little interest or pleasure in doing things? N   PHQ-9 Total Score -     GAD-7:GAD-7 Score: 0     Substance Use/Abuse Assessment :    Do you have any concerns about substance use/abuse today? no  Has a provider ever expressed concerns about your use of pain medications or other medications? no    Summary:  Writer saw patient via high risk screening process.  Writer met with the patient and performed thorough chart review for initial mental health assessment.  The patient/family is aware of writer's role and availability.  The patient has support from his mother who is listed as his emergency contact.  Writer explored the patient's understanding of the diagnosis and treatment.  Resources provided as follows: apps for med reminders. Writer provided emotional support and guidance to the patient/family.  Writer provided education and preventative interventions to  patient.  Writer to collaborate with multidisciplinary team to assess needs.    Recommendations:  Return to care as usual with PCP    Time Spent w/ patient: 35 min

## 2018-01-08 MED ORDER — OXYCODONE HCL 10 MG PO TABS *I*
ORAL_TABLET | ORAL | 0 refills | Status: DC
Start: 2018-01-08 — End: 2018-01-21

## 2018-01-08 NOTE — Telephone Encounter (Signed)
Rx sent in

## 2018-01-08 NOTE — Telephone Encounter (Signed)
French Anaracy calling again for pt medication   Per French Anaracy, she is unhappy on the wait for her son medication   Per French Anaracy, patient is out of medication   French Anaracy would like a call when medication is ready for pick up   French Anaracy best call back number is [phone]

## 2018-01-14 ENCOUNTER — Ambulatory Visit: Payer: Self-pay | Admitting: Student in an Organized Health Care Education/Training Program

## 2018-01-16 ENCOUNTER — Other Ambulatory Visit: Payer: Self-pay | Admitting: Primary Care

## 2018-01-16 ENCOUNTER — Other Ambulatory Visit: Payer: Self-pay | Admitting: Pediatrics

## 2018-01-16 NOTE — Telephone Encounter (Signed)
Search Terms: Cameron BachelorLucius [name], 1995-12-16   Search Date: 01/16/2018 01:58:03 PM   Searching on behalf of: [id number] - Tiffany L [name]   The Drug Utilization Report below displays all of the controlled substance prescriptions, if any, that your patient has filled in the last twelve months. The information displayed on this report is compiled from pharmacy submissions to the Department, and accurately reflects the information as submitted by the pharmacies.  This report was requested by: Milana Obeyndrea Roy   Reference #: [id number]   Others' Prescriptions  Patient Name: Cameron BachelorLucius [name] Birth Date: 1995-12-16   Address: [address] [place], [place] [id number] Sex: Male   Rx Written Rx Dispensed Drug Quantity Days Supply Prescriber Name Payment Method Dispenser   01/08/2018 01/08/2018 oxycodone hcl 10 mg tablet  40 7 Lie, Ariadne [facility]   12/25/2017 12/25/2017 oxycodone hcl 10 mg tablet  40 7 [name], Gwenith Spitziffany L MD Insurance [facility]   12/04/2017 12/05/2017 oxycodone hcl 10 mg tablet  40 7 [name], Gwenith Spitziffany L MD Insurance [facility]   11/25/2017 11/26/2017 oxycodone hcl 10 mg tablet  40 6 [name], Gwenith Spitziffany L MD Insurance [facility]   11/14/2017 11/14/2017 oxycodone hcl 10 mg tablet  40 7 [name], Gwenith Spitziffany L MD Insurance [facility]   11/01/2017 11/02/2017 oxycodone hcl 10 mg tablet  40 7 [name], Gwenith Spitziffany L MD Insurance [facility]   10/23/2017 10/25/2017 oxycodone hcl 10 mg tablet  40 7 [name], [name] Medicaid [facility]   10/10/2017 10/12/2017 oxycodone hcl 10 mg tablet  40 7 [name], [name] Medicaid [facility]   09/30/2017 10/02/2017 oxycodone hcl 10 mg tablet  40 7 [name], [name] Medicaid [facility]   09/18/2017 09/18/2017 oxycodone hcl 10 mg tablet  40 7 [name], [name] Cash [facility]   09/10/2017  09/10/2017 oxycodone hcl 10 mg tablet  40 7 [name], Gwenith Spitziffany L MD Insurance [facility]   09/02/2017 09/04/2017 oxycodone hcl 10 mg tablet  40 7 [name], [name] Cash [facility]   08/26/2017 08/27/2017 oxycodone hcl 10 mg tablet  40 7 Karle BarrLie, Ariadne G MD Cash [facility]   08/09/2017 08/13/2017 oxycodone hcl 10 mg tablet  40 7 [name], Gwenith Spitziffany L MD Insurance [facility]   08/05/2017 08/06/2017 oxycodone hcl 10 mg tablet  40 7 [name], Gwenith Spitziffany L MD Insurance [facility]   07/02/2017 07/02/2017 oxycodone hcl 10 mg tablet  40 7 [name], Gwenith Spitziffany L MD Insurance [facility]   06/04/2017 06/04/2017 oxycodone hcl 10 mg tablet  40 7 [name], Gwenith Spitziffany L MD Insurance [facility]     Patient Name: Cameron [name] Birth Date: 1995-12-16   Address: [address] [place], [place] [id number] Sex: Male   Rx Written Rx Dispensed Drug Quantity Days Supply Prescriber Name Payment Method Dispenser   04/30/2017 04/30/2017 oxycodone hcl 10 mg tablet  40 7 [name], Gwenith Spitziffany L MD Insurance [facility]   03/29/2017 03/29/2017 oxycodone hcl 10 mg tablet  40 7 [name], Gwenith Spitziffany L MD Insurance [facility]   02/22/2017 02/23/2017 oxycodone hcl 10 mg tablet  40 7 [name], [name] [facility]  01/23/2017 01/23/2017 oxycodone hcl 10 mg tablet  40 7 [name], [name] Insurance [facility] [place] [facility]   * - Drugs marked with an asterisk are compound drugs. If the compound drug is made up of more than one controlled substance, then each controlled substance will be a separate row in the table.

## 2018-01-21 ENCOUNTER — Ambulatory Visit: Payer: Medicaid Other | Attending: Primary Care | Admitting: Primary Care

## 2018-01-21 VITALS — BP 116/66 | HR 102 | Ht 74.0 in | Wt 192.5 lb

## 2018-01-21 DIAGNOSIS — K5190 Ulcerative colitis, unspecified, without complications: Secondary | ICD-10-CM | POA: Insufficient documentation

## 2018-01-21 DIAGNOSIS — D571 Sickle-cell disease without crisis: Secondary | ICD-10-CM

## 2018-01-21 LAB — CBC AND DIFFERENTIAL
Baso # K/uL: 0 10*3/uL (ref 0.0–0.1)
Basophil %: 0.4 %
Eos # K/uL: 0.2 10*3/uL (ref 0.0–0.5)
Eosinophil %: 4.1 %
Hematocrit: 21 % — ABNORMAL LOW (ref 40–51)
Hemoglobin: 5.5 g/dL — ABNORMAL LOW (ref 13.7–17.5)
IMM Granulocytes #: 0 10*3/uL
IMM Granulocytes: 0.2 %
Lymph # K/uL: 1.3 10*3/uL (ref 1.3–3.6)
Lymphocyte %: 28 %
MCH: 16 pg/cell — ABNORMAL LOW (ref 26–32)
MCHC: 26 g/dL — ABNORMAL LOW (ref 32–37)
MCV: 60 fL — ABNORMAL LOW (ref 79–92)
Mono # K/uL: 0.5 10*3/uL (ref 0.3–0.8)
Monocyte %: 11.3 %
Neut # K/uL: 2.6 10*3/uL (ref 1.8–5.4)
Nucl RBC # K/uL: 0 10*3/uL (ref 0.0–0.0)
Nucl RBC %: 0 /100 WBC (ref 0.0–0.2)
Platelets: 315 10*3/uL (ref 150–330)
RBC: 3.5 MIL/uL — ABNORMAL LOW (ref 4.6–6.1)
RDW: 20.9 % — ABNORMAL HIGH (ref 11.6–14.4)
Seg Neut %: 56 %
WBC: 4.6 10*3/uL (ref 4.2–9.1)

## 2018-01-21 LAB — OCCULT BLOOD X 1, STOOL: Occult Blood 1: POSITIVE — AB

## 2018-01-21 LAB — FECAL LACTOFERRIN ANTIGEN: Fecal lactoferrin antigen: POSITIVE — AB

## 2018-01-21 MED ORDER — FERROUS SULFATE 325 (65 FE) MG PO TABS *WRAPPED* *I*
325.0000 mg | ORAL_TABLET | Freq: Two times a day (BID) | ORAL | 3 refills | Status: DC
Start: 2018-01-21 — End: 2018-04-29

## 2018-01-21 MED ORDER — OXYCODONE HCL 10 MG PO TABS *I*
ORAL_TABLET | ORAL | 0 refills | Status: DC
Start: 2018-01-21 — End: 2018-02-12

## 2018-01-21 NOTE — Telephone Encounter (Signed)
Discussed with Pookela at f/u visit today

## 2018-01-21 NOTE — Telephone Encounter (Signed)
Will refill at visit today.

## 2018-01-21 NOTE — Progress Notes (Signed)
Patient Name: Cameron [name] Birth Date: 29-Mar-1996   Address: [address] [place], [place] [address] Sex: Male   Rx Written Rx Dispensed Drug Quantity Days Supply Prescriber Name Payment Method Dispenser   01/08/2018 01/08/2018 oxycodone hcl 10 mg tablet  40 7 Lie, Ariadne Insurance [facility]   12/25/2017 12/25/2017 oxycodone hcl 10 mg tablet  40 7 [name], [name] MD Insurance [facility]   12/04/2017 12/05/2017 oxycodone hcl 10 mg tablet  40 7 [name], [name] MD Insurance [facility]   11/25/2017 11/26/2017 oxycodone hcl 10 mg tablet  40 6 [name], [name] MD Insurance [facility]   11/14/2017 11/14/2017 oxycodone hcl 10 mg tablet  40 7 [name], [name] MD Insurance [facility]   11/01/2017 11/02/2017 oxycodone hcl 10 mg tablet  40 7 [name], [name] MD Insurance [facility]   10/23/2017 10/25/2017 oxycodone hcl 10 mg tablet  40 7 [name], [name] Medicaid [facility]   10/10/2017 10/12/2017 oxycodone hcl 10 mg tablet  40 7 [name], [name] Medicaid [facility]   09/30/2017 10/02/2017 oxycodone hcl 10 mg tablet  40 7 [name], [name] Medicaid [facility]   09/18/2017 09/18/2017 oxycodone hcl 10 mg tablet  40 7 [name], [name] Cash [facility]   09/10/2017 09/10/2017 oxycodone hcl 10 mg tablet  40 7 [name], [name] MD Insurance [facility]   09/02/2017 09/04/2017 oxycodone hcl 10 mg tablet  40 7 [name], [name] Cash [facility]   08/26/2017 08/27/2017 oxycodone hcl 10 mg tablet  40 7 Karle Barr MD Cash [facility]   08/09/2017 08/13/2017 oxycodone hcl 10 mg tablet  40 7 [name], [name] MD Insurance [facility]   08/05/2017 08/06/2017 oxycodone hcl 10 mg tablet  40 7 [name], [name] MD Insurance [facility]   07/02/2017 07/02/2017 oxycodone hcl 10 mg tablet  40 7 [name], [name] MD Insurance [facility]   06/04/2017 06/04/2017 oxycodone hcl 10 mg tablet  40 7 [name], [name] MD Insurance [facility]     Patient Name: Cameron [name] Birth Date: Jul 09, 1996   Address: [address] [place], [place] [address] Sex: Male   Rx Written Rx Dispensed Drug Quantity Days Supply Prescriber Name Payment Method Dispenser   04/30/2017 04/30/2017 oxycodone hcl 10 mg tablet  40 7 [name], [name] MD Insurance [facility]   03/29/2017 03/29/2017 oxycodone hcl 10 mg tablet  40 7 [name], [name] MD Insurance [facility]   02/22/2017 02/23/2017 oxycodone hcl 10 mg tablet  40 7 [name], [name] MD Insurance [facility]   01/23/2017 01/23/2017 oxycodone hcl 10 mg tablet  40 7 [name], [name] Insurance [facility] [place] [facility]

## 2018-01-21 NOTE — Progress Notes (Signed)
[facility]  [address]  [place] [address]  Phone: [phone]  Fax: [phone]    REASON FOR VISIT     Follow up for complex situation involving new ulcerative colitis diagnosis in the setting of chronic HgSS.    PRIMARY DIAGNOSIS     HgSS with HPFH, Ulcerative Colitis     SUBJECTIVE     Ulcerative Colitis  -Has not had abdominal pain, but has about half the week that he has about 3 lose stools over the course of the day and on "good days" about one more formed stool  - no bloody stools or mucoid  - has not had f/u with GI, did not attend last visit  - taking mesalamine only a few times a week, when prompted by his mother  - does not associate bad days with any worsening abdominal or back pain  -  HgSS  -Has been having b/l low back pain more over the last month  - feels consistent with his SCD pain and worse with increased activity  - decreased energy levels, but not as severe as when his HcT was 15, has not been able to play basketball as often  - has been taking his oxy IR at least daily increased from about twice a week at last visit  - has not had repeat labs  - has not been taking his ferrous sulfate consistently and missed f/u with hematology     Social  -Here today with his mother Ocie Doyne) because he feels he needs some help reminding him about apopintments and she expressed desire to attend to better understand why he is not receiving his pain medications and I am requiring appointments.  Mother considering move to [place] at the end of the year, Alecsander is not sure what he is going to do.    Past Medical History, Surgical History, Social History, Family History, and Medications/allergies reviewed during this visit    Current Outpatient Prescriptions   Medication    oxyCODONE (ROXICODONE) 10 MG immediate release tablet    folic acid (FOLVITE) 1 MG tablet    mesalamine (ASACOL HD) 800 MG tablet    cholecalciferol (VITAMIN D) 1000 UNIT tablet    ferrous sulfate  325 (65 FE) MG tablet    clindamycin-benzoyl peroxide (BENZACLIN) gel    naloxone (NARCAN) 4 mg/0.1 mL nasal spray    Electronic Thermometer (DIGITAL THERMOMETER) MISC     No current facility-administered medications for this visit.        Review of Systems as per HPI above      OBJECTIVE     BP 116/66    Pulse 102    Ht 1.88 m ( )    Wt 87.3 kg (192 lb 8 oz)    SpO2 100%    BMI 24.72 kg/m       Physical Exam   Constitutional: He is oriented to person, place, and time and well-developed, well-nourished, and in no distress.   HENT:   Mucosal and nail bed pallor   Abdominal: Soft. He exhibits no distension. There is no tenderness.   Hyperactive BS   Neurological: He is alert and oriented to person, place, and time.   Skin: There is pallor.         Lab results: 12/25/17  1930   WBC 4.9   Hemoglobin 5.8*   Hematocrit 22*   RBC 3.7*   Platelets 293     Ferritin   Date  Value Ref Range Status   12/25/2017 6 (L) 20 - 250 ng/mL Final             ASSESSMENT / DIAGNOSIS     22 yo with Hg SS disease with high HgF dx with UC in the last year delaying entry to college.  Here with mother today who would like more information about how Murat' care plan is occurring so she can better support him.  She is especially concerned because she will be out of town for the next 4 wks .     Spent about 50% of visit reivewing with patient and his mother care plan to address uncontrolled UC, resulting iron deficiency and worsening sx of SCD    Sickle Cell Disease  -Appears to be doing well.  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    Electronically signed by Benay Pillow, MD , 01/21/2018 @   [facility], Phone: [phone]    Attending Cosignature   I was present for patient visit and agree with assessment and plan as outlined.  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  We worked together during the visit to sign him up for my chart to help him see appointments and downloaded the app on his phone. Labs drawn in office and stool collection materials provided.    Benay Pillow, MD  [facility]

## 2018-01-28 ENCOUNTER — Ambulatory Visit: Payer: Medicaid Other | Admitting: Primary Care

## 2018-02-11 ENCOUNTER — Telehealth: Payer: Self-pay | Admitting: Primary Care

## 2018-02-11 NOTE — Telephone Encounter (Signed)
@  ZOXWRUE@  @    Reminder Phone Call    Patient ID: Cameron [name] is a 22 y.o. male.  Appointments: Future Appointments  Date Time Provider Department Center   02/12/2018 11:00 AM [name], Diona Browner, MD [facility] None     Dental:     Insurance reviewed and noted insurance is     Spoke with  and reviewed upcoming appointment at [facility].-WRITER SPOKE WITH PATIENT AND CONFIRMED APPT WITH DR. Concha Norway ON Wednesday 02/12/18 AT 11:00AM.    Patient/caregiver requested reschedule of appointment and rebooked for:    Patient/caregiver requested cancellation of appointment and team member notified was:    Reviewed location of office at [address] and patient plans on coming via      Reminded to bring all medications or list and  insurance card (dental/medical)    Patient has a dental visit:    Asked if patient had any barriers to attending appointment and the following requested:

## 2018-02-12 ENCOUNTER — Ambulatory Visit: Payer: Medicaid Other | Admitting: Primary Care

## 2018-02-12 ENCOUNTER — Telehealth: Payer: Self-pay | Admitting: Primary Care

## 2018-02-12 ENCOUNTER — Encounter: Payer: Self-pay | Admitting: Primary Care

## 2018-02-12 ENCOUNTER — Other Ambulatory Visit: Payer: Self-pay | Admitting: Primary Care

## 2018-02-12 ENCOUNTER — Ambulatory Visit
Admission: RE | Admit: 2018-02-12 | Discharge: 2018-02-12 | Disposition: A | Discharge status: Home / Self Care | Payer: Medicaid Other | Source: Ambulatory Visit | Attending: Primary Care | Admitting: Primary Care

## 2018-02-12 VITALS — BP 128/68 | HR 87 | Ht 74.0 in | Wt 194.5 lb

## 2018-02-12 DIAGNOSIS — M549 Dorsalgia, unspecified: Secondary | ICD-10-CM

## 2018-02-12 DIAGNOSIS — D571 Sickle-cell disease without crisis: Secondary | ICD-10-CM | POA: Insufficient documentation

## 2018-02-12 DIAGNOSIS — K5190 Ulcerative colitis, unspecified, without complications: Secondary | ICD-10-CM

## 2018-02-12 DIAGNOSIS — M25562 Pain in left knee: Secondary | ICD-10-CM | POA: Insufficient documentation

## 2018-02-12 LAB — CBC AND DIFFERENTIAL
Baso # K/uL: 0 10*3/uL (ref 0.0–0.1)
Basophil %: 0.4 %
Eos # K/uL: 0.2 10*3/uL (ref 0.0–0.5)
Eosinophil %: 4.6 %
Hematocrit: 19 % — CL (ref 40–51)
Hemoglobin: 5.2 g/dL — ABNORMAL LOW (ref 13.7–17.5)
IMM Granulocytes #: 0 10*3/uL
IMM Granulocytes: 0.2 %
Lymph # K/uL: 1.3 10*3/uL (ref 1.3–3.6)
Lymphocyte %: 26.5 %
MCH: 16 pg/cell — ABNORMAL LOW (ref 26–32)
MCHC: 27 g/dL — ABNORMAL LOW (ref 32–37)
MCV: 59 fL — ABNORMAL LOW (ref 79–92)
Mono # K/uL: 0.6 10*3/uL (ref 0.3–0.8)
Monocyte %: 11 %
Neut # K/uL: 2.9 10*3/uL (ref 1.8–5.4)
Nucl RBC # K/uL: 0 10*3/uL (ref 0.0–0.0)
Nucl RBC %: 0.4 /100 WBC — ABNORMAL HIGH (ref 0.0–0.2)
Platelets: 401 10*3/uL — ABNORMAL HIGH (ref 150–330)
RBC: 3.3 MIL/uL — ABNORMAL LOW (ref 4.6–6.1)
RDW: 21 % — ABNORMAL HIGH (ref 11.6–14.4)
Seg Neut %: 57.3 %
WBC: 5 10*3/uL (ref 4.2–9.1)

## 2018-02-12 NOTE — Progress Notes (Signed)
This report was requested by: Laruth Bouchard   Reference #: [id number]   Others' Prescriptions  Patient Name: Cameron [name] Birth Date: July 05, 1996   Address: [address] [place], [place] [address] Sex: Male   Rx Written Rx Dispensed Drug Quantity Days Supply Prescriber Name Payment Method Dispenser   01/21/2018 01/21/2018 oxycodone hcl 10 mg tablet  80 13 [name], [name] MD Insurance [facility]   01/08/2018 01/08/2018 oxycodone hcl 10 mg tablet  40 7 Lie, Ariadne Insurance [facility]   12/25/2017 12/25/2017 oxycodone hcl 10 mg tablet  40 7 [name], [name] MD Insurance [facility]   12/04/2017 12/05/2017 oxycodone hcl 10 mg tablet  40 7 [name], [name] MD Insurance [facility]   11/25/2017 11/26/2017 oxycodone hcl 10 mg tablet  40 6 [name], [name] MD Insurance [facility]   11/14/2017 11/14/2017 oxycodone hcl 10 mg tablet  40 7 [name], [name] MD Insurance [facility]   11/01/2017 11/02/2017 oxycodone hcl 10 mg tablet  40 7 [name], [name] MD Insurance [facility]   10/23/2017 10/25/2017 oxycodone hcl 10 mg tablet  40 7 [name], [name] Medicaid [facility]   10/10/2017 10/12/2017 oxycodone hcl 10 mg tablet  40 7 [name], [name] Medicaid [facility]   09/30/2017 10/02/2017 oxycodone hcl 10 mg tablet  40 7 [name], [name] Medicaid [facility]   09/18/2017 09/18/2017 oxycodone hcl 10 mg tablet  40 7 [name], [name] Cash [facility]   09/10/2017 09/10/2017 oxycodone hcl 10 mg tablet  40 7 [name], [name] MD Insurance [facility]   09/02/2017 09/04/2017 oxycodone hcl 10 mg tablet  40 7 [name], [name] Cash [facility]   08/26/2017 08/27/2017 oxycodone hcl 10 mg tablet  40 7 Karle Barr MD Cash [facility]    08/09/2017 08/13/2017 oxycodone hcl 10 mg tablet  40 7 [name], [name] MD Insurance [facility]   08/05/2017 08/06/2017 oxycodone hcl 10 mg tablet  40 7 [name], [name] MD Insurance [facility]   07/02/2017 07/02/2017 oxycodone hcl 10 mg tablet  40 7 [name], [name] MD Insurance [facility]   06/04/2017 06/04/2017 oxycodone hcl 10 mg tablet  40 7 [name], [name] MD Insurance [facility]     Patient Name: Cameron [name] Birth Date: [birth year]   Address: [address] [place], [place] [address] Sex: Male   Rx Written Rx Dispensed Drug Quantity Days Supply Prescriber Name Payment Method Dispenser   04/30/2017 04/30/2017 oxycodone hcl 10 mg tablet  40 7 [name], [name] MD Insurance [facility]   03/29/2017 03/29/2017 oxycodone hcl 10 mg tablet  40 7 [name], [name] MD Insurance [facility]   02/22/2017 02/23/2017 oxycodone hcl 10 mg tablet  40 7 [name], [name] Insurance [facility] [place] [facility]   * - Drugs marked with an asterisk are compound drugs. If the compound drug is made up of more than one controlled substance, then each controlled substance will be a separate row in the table.

## 2018-02-12 NOTE — Telephone Encounter (Signed)
Search Terms: Jassiel Flye, [birth year]   Search Date: 02/12/2018 01:07:25 PM   Searching on behalf of: [id number] - Tiffany L [name]   The Drug Utilization Report below displays all of the controlled substance prescriptions, if any, that your patient has filled in the last twelve months. The information displayed on this report is compiled from pharmacy submissions to the Department, and accurately reflects the information as submitted by the pharmacies.  This report was requested by: Tonny Bollman   Reference #: [id number]   Others' Prescriptions  Patient Name: Cameron [name] Birth Date: 1996/06/09   Address: [address] [place], [place] [address] Sex: Male   Rx Written Rx Dispensed Drug Quantity Days Supply Prescriber Name Payment Method Dispenser   01/21/2018 01/21/2018 oxycodone hcl 10 mg tablet  80 13 [name], [name] MD Insurance [facility]   01/08/2018 01/08/2018 oxycodone hcl 10 mg tablet  40 7 Lie, Ariadne Insurance [facility]   12/25/2017 12/25/2017 oxycodone hcl 10 mg tablet  40 7 [name], [name] MD Insurance [facility]   12/04/2017 12/05/2017 oxycodone hcl 10 mg tablet  40 7 [name], [name] MD Insurance [facility]   11/25/2017 11/26/2017 oxycodone hcl 10 mg tablet  40 6 [name], [name] MD Insurance [facility]   11/14/2017 11/14/2017 oxycodone hcl 10 mg tablet  40 7 [name], [name] MD Insurance [facility]   11/01/2017 11/02/2017 oxycodone hcl 10 mg tablet  40 7 [name], [name] MD Insurance [facility]   10/23/2017 10/25/2017 oxycodone hcl 10 mg tablet  40 7 [name], [name] Medicaid [facility]   10/10/2017 10/12/2017 oxycodone hcl 10 mg tablet  40 7 [name], [name] Medicaid [facility]   09/30/2017 10/02/2017 oxycodone hcl 10 mg tablet  40 7 [name], [name] Medicaid [facility]    09/18/2017 09/18/2017 oxycodone hcl 10 mg tablet  40 7 [name], [name] Cash [facility]   09/10/2017 09/10/2017 oxycodone hcl 10 mg tablet  40 7 [name], [name] MD Insurance [facility]   09/02/2017 09/04/2017 oxycodone hcl 10 mg tablet  40 7 [name], [name] Cash [facility]   08/26/2017 08/27/2017 oxycodone hcl 10 mg tablet  40 7 Karle Barr MD Cash [facility]   08/09/2017 08/13/2017 oxycodone hcl 10 mg tablet  40 7 [name], [name] MD Insurance [facility]   08/05/2017 08/06/2017 oxycodone hcl 10 mg tablet  40 7 [name], [name] MD Insurance [facility]   07/02/2017 07/02/2017 oxycodone hcl 10 mg tablet  40 7 [name], [name] MD Insurance [facility]   06/04/2017 06/04/2017 oxycodone hcl 10 mg tablet  40 7 [name], [name] MD Insurance [facility]     Patient Name: Cameron [name] Birth Date: Feb 15, 1996   Address: [address] [place], [place] [address] Sex: Male   Rx Written Rx Dispensed Drug Quantity Days Supply Prescriber Name Payment Method Dispenser   04/30/2017 04/30/2017 oxycodone hcl 10 mg tablet  40 7 [name], [name] MD Insurance [facility]   03/29/2017 03/29/2017 oxycodone hcl 10 mg tablet  40 7 [name], [name] Insurance [facility] [place] [facility] [place] [facility]  02/22/2017 02/23/2017 oxycodone hcl 10 mg tablet  40 7 [name], [name] Insurance [facility] [place] [facility]   * - Drugs marked with an asterisk are compound drugs. If the compound drug is made up of more than one controlled substance, then each controlled substance will be a separate row in the table.      Report Suspicious Activity   Send Questions/Comments   Substance Abuse Treatment Information     Click the Report Suspicious Activity button to report information related to controlled substance suspicious activity to the [employer] of  Narcotic Enforcement.   Click the Send Questions/Comments button to send questions about this report to the [facility] of Narcotic Enforcement, or call [phone].   Click the Substance Abuse Treatment Information button to go to the Office of Alcoholism and Substance Abuse Services website, www.[place]asas.[employer].hu or call 305-742-5469.    2019 [facility]

## 2018-02-12 NOTE — Telephone Encounter (Signed)
Spoke to [place], patient mother   Per French Ana, patient is all out of medication and would need to take one tonight

## 2018-02-12 NOTE — Telephone Encounter (Signed)
Called by lab for critical value of HCT of 19. Not too far from recent result of 21.  Called Cameron [name] regarding these results. He is currently asymptomatic. No chest pain, dyspnea, light headedness. No indication for emergent management overnight. Will forward results to PCP.

## 2018-02-12 NOTE — Telephone Encounter (Signed)
NA

## 2018-02-13 ENCOUNTER — Other Ambulatory Visit: Payer: Self-pay | Admitting: Primary Care

## 2018-02-13 MED ORDER — OXYCODONE HCL 10 MG PO TABS *I*
ORAL_TABLET | ORAL | 0 refills | Status: DC
Start: 2018-02-13 — End: 2018-02-25

## 2018-02-13 NOTE — Telephone Encounter (Signed)
Spoke to [employer] that knee xray came back normal, pt happy with this result  Advised that his blood count is declining, but pt states he remains asymptomatic at this time  Offered pt an appointment next week to get labs redrawn and to check in on the knee pain  Pt states he prefers to go to the lab and requested writer call to remind him of this next week  Will postpone task for nursing to remind pt and to check in on knee next week

## 2018-02-13 NOTE — Telephone Encounter (Signed)
Please call Cameron [name] this morning to let him know that his knee x ray also looked fine.  His blood count is declining but with his improvement in his ulcerative colitis sx I am hopeful for recovery but it may explain why he is having more pain over the last week in his knee.  I would recommend we repeat his blood count next week and if that requires a visit with [place] for him to remember we can do a visit with Cameron [name]  He was very anxious about this knee pain so he may want a visit next week

## 2018-02-13 NOTE — Telephone Encounter (Signed)
Please notify patient that refill completed to listed pharmacy

## 2018-02-19 NOTE — Addendum Note (Signed)
Addended by: Loanne Drilling on: 02/19/2018 11:05 AM     Modules accepted: Orders

## 2018-02-19 NOTE — Telephone Encounter (Signed)
Spoke to patient  States his knee continues to improve, no concerns at this time  Reminded patient that we would like lab work drawn to check his blood level  Pt prefers to have a [facility] RN draw his blood   Scheduled Thursday 5/29 at 10:30am for a nurse visit  Ordered CBC. Will route to PCP if any further lab work needed.

## 2018-02-19 NOTE — Telephone Encounter (Signed)
Thank you :)

## 2018-02-20 ENCOUNTER — Ambulatory Visit: Payer: Medicaid Other

## 2018-02-24 ENCOUNTER — Other Ambulatory Visit: Payer: Self-pay | Admitting: Primary Care

## 2018-02-24 ENCOUNTER — Telehealth: Payer: Self-pay | Admitting: Primary Care

## 2018-02-24 NOTE — Telephone Encounter (Signed)
@  [email or web address]  @[email or web address] @    Reminder Phone Call    Patient ID: Cameron [name] is a 22 y.o. male.  Appointments: Future Appointments  Date Time Provider Department Center   02/25/2018 2:00 PM [name], Diona Browneriffany Lynn, MD [facility] None     Dental:     Insurance reviewed and noted insurance is     Spoke with PATIENT and [facility]25/[facility] AT 2:00PM WITH DR. Concha NorwayPULCINO.    Patient/caregiver requested reschedule of appointment and rebooked for:    Patient/caregiver requested cancellation of appointment and team member notified was:    Reviewed location of office at [address] and patient plans on coming via      Reminded to bring all medications or list and  insurance card (dental/medical)    Patient has a dental visit:    Asked if patient had any barriers to attending appointment and the following requested:

## 2018-02-25 ENCOUNTER — Ambulatory Visit: Payer: Medicaid Other | Attending: Primary Care | Admitting: Primary Care

## 2018-02-25 ENCOUNTER — Encounter: Payer: Self-pay | Admitting: Primary Care

## 2018-02-25 DIAGNOSIS — D564 Hereditary persistence of fetal hemoglobin [HPFH]: Secondary | ICD-10-CM | POA: Insufficient documentation

## 2018-02-25 DIAGNOSIS — K5190 Ulcerative colitis, unspecified, without complications: Secondary | ICD-10-CM

## 2018-02-25 DIAGNOSIS — D571 Sickle-cell disease without crisis: Secondary | ICD-10-CM | POA: Insufficient documentation

## 2018-02-25 LAB — CBC AND DIFFERENTIAL
Baso # K/uL: 0 10*3/uL (ref 0.0–0.1)
Basophil %: 0.3 %
Eos # K/uL: 0.4 10*3/uL (ref 0.0–0.5)
Eosinophil %: 5.7 %
Hematocrit: 21 % — ABNORMAL LOW (ref 40–51)
Hemoglobin: 5.7 g/dL — ABNORMAL LOW (ref 13.7–17.5)
IMM Granulocytes #: 0 10*3/uL
IMM Granulocytes: 0.3 %
Lymph # K/uL: 1.3 10*3/uL (ref 1.3–3.6)
Lymphocyte %: 19 %
MCH: 17 pg/cell — ABNORMAL LOW (ref 26–32)
MCHC: 28 g/dL — ABNORMAL LOW (ref 32–37)
MCV: 61 fL — ABNORMAL LOW (ref 79–92)
Mono # K/uL: 0.4 10*3/uL (ref 0.3–0.8)
Monocyte %: 6.5 %
Neut # K/uL: 4.5 10*3/uL (ref 1.8–5.4)
Nucl RBC # K/uL: 0 10*3/uL (ref 0.0–0.0)
Nucl RBC %: 0 /100 WBC (ref 0.0–0.2)
Platelets: 283 10*3/uL (ref 150–330)
RBC: 3.4 MIL/uL — ABNORMAL LOW (ref 4.6–6.1)
RDW: 24.4 % — ABNORMAL HIGH (ref 11.6–14.4)
Seg Neut %: 68.2 %
WBC: 6.6 10*3/uL (ref 4.2–9.1)

## 2018-02-25 LAB — RBC MORPHOLOGY

## 2018-02-25 MED ORDER — OXYCODONE HCL 10 MG PO TABS *I*
ORAL_TABLET | ORAL | 0 refills | Status: DC
Start: 2018-02-25 — End: 2018-03-12

## 2018-02-25 MED ORDER — MESALAMINE 800 MG PO TBEC *I*
2400.0000 mg | DELAYED_RELEASE_TABLET | Freq: Two times a day (BID) | ORAL | 2 refills | Status: DC
Start: 2018-02-25 — End: 2018-04-29

## 2018-02-25 NOTE — Progress Notes (Signed)
[facility]  [address]  [place] [id number]  Phone: [phone]  Fax: [phone]    REASON FOR VISIT     Sickle Cell VOC    PRIMARY DIAGNOSIS   Sickle Cell disease, type  HgSS    SUBJECTIVE     Here today with his mother     Sickle cell disease:  Reports his left knee pain he outlined at his last visit has improved after doing the excercises I demonstrated int he office  Has noted persistent back pain for which he believes he is taking oxy IR about 3-4 times per week, usually at night  Has applied to a new job at [employer] and will need a letter confirming his prescribed medication     Current home management:  Non pharmacologic interventions: rest, heat, fluids   Non opioid pain modifying medications: unable to take NSAID or acetaminophen per GI  Short acting Opioid pain medications: oxy IR 10 mg Q 4 hr  Long acting Opioid pain medications: none    Current Pain score: There were no vitals filed for this visit.      Emergency Room visits for this event: 0  Hospitalizations for this event: 0    Current disease modifying therapy: none    Ulcerative colitis:  Patient reports his stooling pattern continues at 1-2 stools per day  motehr notes significant improvement from prior pattern of 5-6 and her son having to run to the bathroom frequently during the day  Dessie reports that he has not rescheduled his f/u with Dr. Gena FrayQuazi of hematology or with gastroenterology and needs support with this and he would like a call and my chart with information      Medications Reviewed and changes     Current Outpatient Prescriptions:     oxyCODONE (ROXICODONE) 10 MG immediate release tablet, Take 1 tablet (10 mg total) by mouth every 4 hours as needed for Pain.  Max daily dose: 6 tablets, Disp: 80 tablet, Rfl: 0    ferrous sulfate 325 (65 FE) MG tablet, Take 1 tablet (325 mg total) by mouth 2 times daily (with meals), Disp: 100 tablet, Rfl: 3    folic acid (FOLVITE) 1 MG tablet, Take 1 tablet (1 mg total)  by mouth daily, Disp: 30 tablet, Rfl: 5    mesalamine (ASACOL HD) 800 MG tablet, Take 3 tablets (2,400 mg total) by mouth 2 times daily, Disp: 180 tablet, Rfl: 2    cholecalciferol (VITAMIN D) 1000 UNIT tablet, Take 4 tablets (4,000 Units total) by mouth daily, Disp: 120 tablet, Rfl: 5    clindamycin-benzoyl peroxide (BENZACLIN) gel, Apply topically 2 times daily, Disp: 25 g, Rfl: 5    naloxone (NARCAN) 4 mg/0.1 mL nasal spray, Instill 1 spray in 1 nostril once for opioid reversal. Repeat in alternating nostrils with new package every 2-3 minutes until response, Disp: 2 each, Rfl: 5    Electronic Thermometer (DIGITAL THERMOMETER) MISC, Use as directed, Disp: 1 each, Rfl: 0  Review of Systems  OBJECTIVE     There were no vitals taken for this visit.  Physical Exam   Constitutional: He appears well-developed and well-nourished. No distress.   Psychiatric: He has a normal mood and affect. His behavior is normal. Judgment and thought content normal.           Lab results: 02/12/18  1608   WBC 5.0   Hemoglobin 5.2*   Hematocrit 19*   RBC 3.3*   Platelets 401*  ASSESSMENT / DIAGNOSIS   22 yo with Hg SS disease with high HgF dx with UC in the last year delaying entry to college.  Here with mother today Diagnoses and all orders for this visit:    Sickle cell disease with HPFH  Labs drawn in office today, HcT trending down in last evaluation and persistent +FIT on evaluation for UC control  Plan had been Q2 weeks labs per GI and hematology last consultation but delayed in f/u of both specialities  Mother would like visits scheduled after late July as she will be back in town at that time  Reviewed with patient that he should be taking 3 tab BID of mesalamine    Will ask CM to facilitate specialty f/u visits    If HcT<18 will contact patient by phone and my chart     He prefers to come for visits every two weeks to ensure engagement  He was provided letter for employment to support rx'd pain management  regimen  Discussed options for college enrollment and resources available for his care in [place] area        Pain management treatment plan   Patient has co diagnosis of ulcerative colitis dx age 2.  In addition Blood type O+ with antibodies anti-fya,jkb,lea  Outpatient Pain Regimen:  Long acting: none  Short acting; Oxy IR 10mg  Q4 hrs prn pain  Home Based titration regimen: none, needs assessment due to uncontrolled UC0  Adjunct therapies: none, holding NSAID due to ongoing LGI bleeding  Other: none    [facility]    ED/Infusion Center care plan recommendations:  Toradol 30 mg IV Q6 hrs ATC  NS 2 L IV bolus  Transfuse for HcT <18  Hydromorphone 2 mg IV Q30 min prn pain score>5  Recommend hospitalization is pain score remains >5 after 3 doses of Hydromorphone 2 mg IV    Labs on arrival: CBC, retic, CHEM14  CXR if complaints of chest pain to evaluate for infiltrate consistent with acute chest syndrome  Assess for need for bronchodilator if complaints of chest pain    Inpatient care plan recommendations:  Toradol 30 mg IV Q6 hrs ATC  NS bolus until euvolemic followed by D51/2NS at 125 cc/hr until tolerating PO well  PCA: Start with demand only Hydromorophone PCA 1 mg Q20 min with lockout 12 mg over 4 hrs Recommend titration of pain regimen until achieving pain score <5     Prior to discharge recommend 24 hours of PO pain medications to assess for sx of withdrawal with transition and continued pain control to prevent readmission    Transfuse if HcT< 18 and consider GI consult as patient's most recent cause for profound anemia is due to UC        There are no Patient Instructions on file for this visit.    No Follow-up on file.  --Patient instructed to call if symptoms are not improving or worsening      Electronically signed by Benay Pillow, MD 02/25/2018   1:04 PM  [facility], Phone: [phone]

## 2018-03-06 ENCOUNTER — Telehealth: Payer: Self-pay | Admitting: Student in an Organized Health Care Education/Training Program

## 2018-03-06 NOTE — Telephone Encounter (Signed)
Cameron EssexJanaye, Care Manager is calling to schedule an appointment for the patient with Dr. Nedra HaiLee. The patient would like to be seen for FUV. Please Cameron [name] to schedule at [phone] or [phone].

## 2018-03-06 NOTE — Progress Notes (Deleted)
CM called and rescheduled an appointment for pt gastroenterology and herpetology appt.  While on the phone  OAS explain that she  does not have access to  Dr. Lee schedule. Oas will forward clinic schedule and will reach out to CM to set up an appointment for pt

## 2018-03-06 NOTE — Progress Notes (Signed)
CM called and rescheduled an appointment for pt gastroenterology and herpetology appt.  While on the phone  OAS explain that she  does not have access to  Dr. Nedra HaiLee schedule. Oas will forward clinic schedule and will reach out to CM to set up an appointment for pt

## 2018-03-07 NOTE — Telephone Encounter (Signed)
error 

## 2018-03-10 NOTE — Telephone Encounter (Signed)
Called Cameron [name]. Advised her that the patient has missed his last 2 appts and ricks the chance of being d/c from our practice. Called patient again. Left vm advising patient to come in on 6/18 at 9am with dr. Nedra HaiLee on ac5. Please confirm appt date and time or WARM transfer.

## 2018-03-11 ENCOUNTER — Telehealth: Payer: Self-pay | Admitting: Primary Care

## 2018-03-11 NOTE — Telephone Encounter (Signed)
@  [email or web address]  @[email or web address] @    Reminder Phone Call    Patient ID: Cameron [name] is a 22 y.o. male.  Appointments: Future Appointments  Date Time Provider Department Center   03/12/2018 10:30 AM [name], Diona Browneriffany Lynn, MD [facility] None     Dental:     Insurance reviewed and noted insurance is     [facility] and [facility]VM LEFT FOR PT CONFIRMING APPT WITH DR. Concha NorwayPULCINO ON Wednesday 6/19 AT 10:30AM    Patient/caregiver requested reschedule of appointment and rebooked for:    Patient/caregiver requested cancellation of appointment and team member notified was:    Reviewed location of office at [address] and patient plans on coming via     .  Reminded to bring all medications or list and  insurance card (dental/medical)    Patient has a dental visit: :    Asked if patient had any barriers to attending appointment and the following requested:

## 2018-03-12 ENCOUNTER — Ambulatory Visit: Payer: Medicaid Other | Attending: Primary Care | Admitting: Primary Care

## 2018-03-12 ENCOUNTER — Encounter: Payer: Self-pay | Admitting: Primary Care

## 2018-03-12 ENCOUNTER — Other Ambulatory Visit: Payer: Self-pay | Admitting: Primary Care

## 2018-03-12 VITALS — BP 124/60 | HR 88 | Ht 74.0 in | Wt 194.0 lb

## 2018-03-12 DIAGNOSIS — D571 Sickle-cell disease without crisis: Secondary | ICD-10-CM | POA: Insufficient documentation

## 2018-03-12 DIAGNOSIS — K5190 Ulcerative colitis, unspecified, without complications: Secondary | ICD-10-CM

## 2018-03-12 LAB — CBC AND DIFFERENTIAL
Baso # K/uL: 0 10*3/uL (ref 0.0–0.1)
Basophil %: 0.4 %
Eos # K/uL: 0.3 10*3/uL (ref 0.0–0.5)
Eosinophil %: 6 %
Hematocrit: 20 % — ABNORMAL LOW (ref 40–51)
Hemoglobin: 5.3 g/dL — ABNORMAL LOW (ref 13.7–17.5)
IMM Granulocytes #: 0 10*3/uL
IMM Granulocytes: 0.6 %
Lymph # K/uL: 1.2 10*3/uL — ABNORMAL LOW (ref 1.3–3.6)
Lymphocyte %: 24.9 %
MCH: 16 pg/cell — ABNORMAL LOW (ref 26–32)
MCHC: 27 g/dL — ABNORMAL LOW (ref 32–37)
MCV: 61 fL — ABNORMAL LOW (ref 79–92)
Mono # K/uL: 0.5 10*3/uL (ref 0.3–0.8)
Monocyte %: 9.9 %
Neut # K/uL: 2.8 10*3/uL (ref 1.8–5.4)
Nucl RBC # K/uL: 0 10*3/uL (ref 0.0–0.0)
Nucl RBC %: 0.4 /100 WBC — ABNORMAL HIGH (ref 0.0–0.2)
Platelets: 456 10*3/uL — ABNORMAL HIGH (ref 150–330)
RBC: 3.3 MIL/uL — ABNORMAL LOW (ref 4.6–6.1)
RDW: 22.5 % — ABNORMAL HIGH (ref 11.6–14.4)
Seg Neut %: 58.2 %
WBC: 4.9 10*3/uL (ref 4.2–9.1)

## 2018-03-12 MED ORDER — OXYCODONE HCL 10 MG PO TABS *I*
ORAL_TABLET | ORAL | 0 refills | Status: DC
Start: 2018-03-12 — End: 2018-03-25

## 2018-03-12 NOTE — Progress Notes (Signed)
Cameron [name] - 54- 22 Prescriptions  Confidential Drug Report  Search Terms: Cameron BachelorLucius [name], [birth year]   Search Date: 03/12/2018 10:42:59 AM   Searching on behalf of: [id number] - Tiffany L [name]   The Drug Utilization Report below displays all of the controlled substance prescriptions, if any, that your patient has filled in the last twelve months. The information displayed on this report is compiled from pharmacy submissions to the Department, and accurately reflects the information as submitted by the pharmacies.  This report was requested by: Laruth BouchardJennifer Felicie Kocher   Reference #: [id number]   Others' Prescriptions  Patient Name: Cameron [name] Birth Date: [birth year]   Address: [address] [place], [place] [id number] Sex: Male   Rx Written Rx Dispensed Drug Quantity Days Supply Prescriber Name Payment Method Dispenser   02/25/2018 02/25/2018 oxycodone hcl 10 mg tablet  80 13 [name], Gwenith Spitziffany L MD [facility]   02/13/2018 02/13/2018 oxycodone hcl 10 mg tablet  80 13 [name], [name] Insurance [facility]   01/21/2018 01/21/2018 oxycodone hcl 10 mg tablet  80 13 [name], [name] Insurance [facility]   01/08/2018 01/08/2018 oxycodone hcl 10 mg tablet  40 7 Lie, Ariadne Insurance [facility]   12/25/2017 12/25/2017 oxycodone hcl 10 mg tablet  40 7 [name], Gwenith Spitziffany L MD Insurance [facility]   12/04/2017 12/05/2017 oxycodone hcl 10 mg tablet  40 7 [name], Gwenith Spitziffany L MD [facility]   11/25/2017 11/26/2017 oxycodone hcl 10 mg tablet  40 6 [name], Gwenith Spitziffany L MD [facility]   11/14/2017 11/14/2017 oxycodone hcl 10 mg tablet  40 7 [name], [name] Insurance [facility]   11/01/2017 11/02/2017 oxycodone hcl 10 mg tablet  40 7 [name], Gwenith Spitziffany L MD Insurance [facility]   10/23/2017 10/25/2017 oxycodone hcl 10 mg tablet  40  7 [name], [name] Medicaid [facility]   10/10/2017 10/12/2017 oxycodone hcl 10 mg tablet  40 7 [name], [name] Medicaid [facility]   09/30/2017 10/02/2017 oxycodone hcl 10 mg tablet  40 7 [name], [name] Medicaid [facility]   09/18/2017 09/18/2017 oxycodone hcl 10 mg tablet  40 7 [name], [name] Cash [facility]   09/10/2017 09/10/2017 oxycodone hcl 10 mg tablet  40 7 [name], Gwenith Spitziffany L MD Insurance [facility]   09/02/2017 09/04/2017 oxycodone hcl 10 mg tablet  40 7 [name], [name] Cash [facility]   08/26/2017 08/27/2017 oxycodone hcl 10 mg tablet  40 7 Karle BarrLie, Ariadne G MD Cash [facility]   08/09/2017 08/13/2017 oxycodone hcl 10 mg tablet  40 7 [name], Gwenith Spitziffany L MD Insurance [facility]   08/05/2017 08/06/2017 oxycodone hcl 10 mg tablet  40 7 [name], Gwenith Spitziffany L MD Insurance [facility]   07/02/2017 07/02/2017 oxycodone hcl 10 mg tablet  40 7 [name], Gwenith Spitziffany L MD Insurance [facility]   06/04/2017 06/04/2017 oxycodone hcl 10 mg tablet  40 7 [name], Gwenith Spitziffany L MD Insurance [facility]     Patient Name: Cameron [name] Birth Date: [birth year]   Address[place] [address] [place], [place] [id number] Sex: Male   Rx Written Rx  Dispensed Drug Quantity Days Supply Prescriber Name Payment Method Dispenser   04/30/2017 04/30/2017 oxycodone hcl 10 mg tablet  40 7 [name], [name] MD Insurance [facility]   03/29/2017 03/29/2017 oxycodone hcl 10 mg tablet  40 7 [name], [name] Insurance [facility] [place] [facility]   * - Drugs marked with an asterisk are compound drugs. If the compound drug is made up of more than one controlled substance, then each controlled substance will be a separate row in the table.

## 2018-03-12 NOTE — Telephone Encounter (Signed)
Please notify patient that refill completed to listed pharmacy

## 2018-03-12 NOTE — Telephone Encounter (Signed)
VM left for patient notifying him that script was sent in to the pharmacy.

## 2018-03-12 NOTE — Telephone Encounter (Signed)
Search Terms: Jaeger Trueheart, May 23, 1996   Search Date: 03/12/2018 01:27:58 PM   Searching on behalf of: [id number] - Tiffany L [name]   The Drug Utilization Report below displays all of the controlled substance prescriptions, if any, that your patient has filled in the last twelve months. The information displayed on this report is compiled from pharmacy submissions to the Department, and accurately reflects the information as submitted by the pharmacies.  This report was requested by: Tonny Bollman   Reference #: [id number]   Others' Prescriptions  Patient Name: Cameron [name] Birth Date: 01-10-1996   Address: [address] [place], [place] [address] Sex: Male   Rx Written Rx Dispensed Drug Quantity Days Supply Prescriber Name Payment Method Dispenser   02/25/2018 02/25/2018 oxycodone hcl 10 mg tablet  80 13 [name], [name] MD Insurance [facility]   02/13/2018 02/13/2018 oxycodone hcl 10 mg tablet  80 13 [name], [name] Insurance [facility]   01/21/2018 01/21/2018 oxycodone hcl 10 mg tablet  80 13 [name], [name] Insurance [facility]   01/08/2018 01/08/2018 oxycodone hcl 10 mg tablet  40 7 Lie, Ariadne Insurance [facility]   12/25/2017 12/25/2017 oxycodone hcl 10 mg tablet  40 7 [name], [name] MD Insurance [facility]   12/04/2017 12/05/2017 oxycodone hcl 10 mg tablet  40 7 [name], [name] MD Insurance [facility]   11/25/2017 11/26/2017 oxycodone hcl 10 mg tablet  40 6 [name], [name] MD Insurance [facility]   11/14/2017 11/14/2017 oxycodone hcl 10 mg tablet  40 7 [name], [name] Insurance [facility]   11/01/2017 11/02/2017 oxycodone hcl 10 mg tablet  40 7 [name], [name] MD Insurance [facility]   10/23/2017 10/25/2017 oxycodone hcl 10 mg tablet  40 7 [name], [name] Medicaid [facility]    10/10/2017 10/12/2017 oxycodone hcl 10 mg tablet  40 7 [name], [name] Medicaid [facility]   09/30/2017 10/02/2017 oxycodone hcl 10 mg tablet  40 7 [name], [name] Medicaid [facility]   09/18/2017 09/18/2017 oxycodone hcl 10 mg tablet  40 7 [name], [name] Cash [facility]   09/10/2017 09/10/2017 oxycodone hcl 10 mg tablet  40 7 [name], [name] MD Insurance [facility]   09/02/2017 09/04/2017 oxycodone hcl 10 mg tablet  40 7 [name], [name] Cash [facility]   08/26/2017 08/27/2017 oxycodone hcl 10 mg tablet  40 7 Karle Barr MD Cash [facility]   08/09/2017 08/13/2017 oxycodone hcl 10 mg tablet  40 7 [name], [name] MD Insurance [facility]   08/05/2017 08/06/2017 oxycodone hcl 10 mg tablet  40 7 [name], [name] MD Insurance [facility]   07/02/2017 07/02/2017 oxycodone hcl 10 mg tablet  40 7 [name], [name] MD Insurance [facility]   06/04/2017 06/04/2017 oxycodone hcl 10 mg tablet  40 7 [name], [name] MD Insurance [facility]     Patient Name: Cameron [name] Birth Date: May 25, 1996   Address: [address] [place], [place] [address] Sex: Male   Rx Written Rx Dispensed Drug Quantity Days Supply Prescriber Name Payment Method Dispenser  04/30/2017 04/30/2017 oxycodone hcl 10 mg tablet  40 7 [name], Gwenith Spitziffany L MD [facility]   03/29/2017 03/29/2017 oxycodone hcl 10 mg tablet  40 7 [name], [name] Insurance [facility]   * - Drugs marked with an asterisk are compound drugs. If the compound drug is made up of more than one controlled substance, then each controlled substance will be a separate row in the table.      Report Suspicious Activity   Send Questions/Comments   Substance Abuse Treatment Information     Click the Report  Suspicious Activity button to report information related to controlled substance suspicious activity to the [employer].   Click the Send Questions/Comments button to send questions about this report to the [facility]arcotic Enforcement, or call [phone].   Click the Substance Abuse Treatment Information button to go to the Office of Alcoholism and Substance Abuse Services website, www.oasas.[employer].huny.gov or call 813-083-33561-(787)223-8821.    2019 [facility]

## 2018-03-12 NOTE — Progress Notes (Signed)
[facility]  [address]  [place] [address]  Phone: [phone]  Fax: [phone]    REASON FOR VISIT     Sickle Cell VOC    PRIMARY DIAGNOSIS   Sickle Cell disease, type  HgSS    SUBJECTIVE     Here today alone    Sickle cell disease:  Has noted persistent back pain for which he believes he is taking oxy IR about 3-4 times per week, usually after a shift working in [employer] and then he has been able to sleep  Has not tried ibuprofen for this pain    Current home management:  Non pharmacologic interventions: rest, heat, fluids   Non opioid pain modifying medications: unable to take NSAID or acetaminophen per GI  Short acting Opioid pain medications: oxy IR 10 mg Q 4 hr  Long acting Opioid pain medications: none    Current Pain score:   Pain    03/12/18 1040   PainSc:   0 - No pain     Emergency Room visits for this event: 0  Hospitalizations for this event: 0  Current disease modifying therapy: none    Ulcerative colitis:  Patient reports his stooling pattern continues at 1-2 stools per day  Taking his mesalamine 2400 mg at least daily, sometimes remembering the second daily dose  Having difficulty remembering to take his ferrous sulfate because it has to be taken with food or he has severe episodes of vomiting    Medications Reviewed and changes     Current Outpatient Prescriptions:     mesalamine (ASACOL HD) 800 MG tablet, Take 3 tablets (2,400 mg total) by mouth 2 times daily, Disp: 180 tablet, Rfl: 2    ferrous sulfate 325 (65 FE) MG tablet, Take 1 tablet (325 mg total) by mouth 2 times daily (with meals), Disp: 100 tablet, Rfl: 3    folic acid (FOLVITE) 1 MG tablet, Take 1 tablet (1 mg total) by mouth daily, Disp: 30 tablet, Rfl: 5    cholecalciferol (VITAMIN D) 1000 UNIT tablet, Take 4 tablets (4,000 Units total) by mouth daily, Disp: 120 tablet, Rfl: 5    clindamycin-benzoyl peroxide (BENZACLIN) gel, Apply topically 2 times daily, Disp: 25 g, Rfl: 5    Electronic  Thermometer (DIGITAL THERMOMETER) MISC, Use as directed, Disp: 1 each, Rfl: 0    oxyCODONE (ROXICODONE) 10 MG immediate release tablet, Take 1 tablet (10 mg total) by mouth every 4 hours as needed for Pain.  Max daily dose: 6 tablets, Disp: 80 tablet, Rfl: 0    naloxone (NARCAN) 4 mg/0.1 mL nasal spray, Instill 1 spray in 1 nostril once for opioid reversal. Repeat in alternating nostrils with new package every 2-3 minutes until response, Disp: 2 each, Rfl: 5  Review of Systems  OBJECTIVE     BP 124/60    Pulse 88    Ht 1.88 m (6\' 2" )    Wt 88 kg (194 lb)    SpO2 100%    BMI 24.91 kg/m   Physical Exam   Constitutional: He appears well-developed and well-nourished. No distress.   Psychiatric: He has a normal mood and affect. His behavior is normal. Judgment and thought content normal.           Lab results: 02/25/18  1918   WBC 6.6   Hemoglobin 5.7*   Hematocrit 21*   RBC 3.4*   Platelets 283         ASSESSMENT / DIAGNOSIS  22 yo with Hg SS disease with high HgF dx with UC in the last year delaying entry to college.    Sickle cell disease with HPFH  Labs drawn in office today, per plan to continue to monitor every 2 weeks  Today drawing only CBC and next visit (likely after he returns from a family vacation will complete CBC and iron studies)  Reviewed with patient that he should be taking 3 tab BID of mesalamine    Reviewed notes from GI who had available appointment for patient yesterday morning but they could not reach patient or CM  Reviewed with Juergen his upcoming appointment with Dr. Gena FrayQuazi in mid July    If HcT<18 will contact patient by phone and my chart     He prefers to come for visits every two weeks to ensure engagement moving forward, feels it has helped him take his medication more regularly  Still planning on attending college in the fall, working with a coach form [employer]  Asked him to trial ibuprofen for management of his back pain after his next shift     Pain management treatment plan    Patient has co diagnosis of ulcerative colitis dx age 89.  In addition Blood type O+ with antibodies anti-fya,jkb,lea  Outpatient Pain Regimen:  Long acting: none  Short acting; Oxy IR 10mg  Q4 hrs prn pain  Home Based titration regimen: none, needs assessment due to uncontrolled UC0  Adjunct therapies: none, holding NSAID due to ongoing LGI bleeding  Other: none    [facility]    ED/Infusion Center care plan recommendations:  Toradol 30 mg IV Q6 hrs ATC  NS 2 L IV bolus  Transfuse for HcT <18  Hydromorphone 2 mg IV Q30 min prn pain score>5  Recommend hospitalization is pain score remains >5 after 3 doses of Hydromorphone 2 mg IV    Labs on arrival: CBC, retic, CHEM14  CXR if complaints of chest pain to evaluate for infiltrate consistent with acute chest syndrome  Assess for need for bronchodilator if complaints of chest pain    Inpatient care plan recommendations:  Toradol 30 mg IV Q6 hrs ATC  NS bolus until euvolemic followed by D51/2NS at 125 cc/hr until tolerating PO well  PCA: Start with demand only Hydromorophone PCA 1 mg Q20 min with lockout 12 mg over 4 hrs Recommend titration of pain regimen until achieving pain score <5     Prior to discharge recommend 24 hours of PO pain medications to assess for sx of withdrawal with transition and continued pain control to prevent readmission    Transfuse if HcT< 18 and consider GI consult as patient's most recent cause for profound anemia is due to UC        There are no Patient Instructions on file for this visit.    Return in about 2 weeks (around 03/26/2018) for [place] 2 week labs and f/u .  --Patient instructed to call if symptoms are not improving or worsening      Electronically signed by Benay PillowIFFANY L Loralei Radcliffe, MD 03/12/2018   1:51 PM  [facility], Phone: [phone]

## 2018-03-12 NOTE — Telephone Encounter (Signed)
Patient needs medication asap.

## 2018-03-13 NOTE — Telephone Encounter (Signed)
Cameron [name] is calling back to reschedule. Scheduled FUV w/ Dr. Nedra HaiLee August 6th at 2pm. Advised of risk of d/c as well. Scheduled w/ Morrie SheldonAshley.

## 2018-03-25 ENCOUNTER — Ambulatory Visit: Payer: Medicaid Other | Attending: Internal Medicine | Admitting: Internal Medicine

## 2018-03-25 ENCOUNTER — Encounter: Payer: Self-pay | Admitting: Gastroenterology

## 2018-03-25 ENCOUNTER — Encounter: Payer: Self-pay | Admitting: Internal Medicine

## 2018-03-25 VITALS — BP 124/72 | HR 121 | Ht 74.02 in | Wt 198.2 lb

## 2018-03-25 DIAGNOSIS — D571 Sickle-cell disease without crisis: Secondary | ICD-10-CM

## 2018-03-25 MED ORDER — OXYCODONE HCL 10 MG PO TABS *I*
ORAL_TABLET | ORAL | 0 refills | Status: DC
Start: 2018-03-25 — End: 2018-04-09

## 2018-03-25 MED ORDER — FOLIC ACID 1 MG PO TABS *I*
1.0000 mg | ORAL_TABLET | Freq: Every day | ORAL | 5 refills | Status: DC
Start: 2018-03-25 — End: 2018-04-29

## 2018-03-25 NOTE — Progress Notes (Signed)
Cameron [name] - 16- 23 Prescriptions  Confidential Drug Report  Search Terms: Cameron [name], June 28, 1996   Search Date: 03/25/2018 11:07:39 AM   Searching on behalf of: [id number] - Cameron [name]   The Drug Utilization Report below displays all of the controlled substance prescriptions, if any, that your patient has filled in the last twelve months. The information displayed on this report is compiled from pharmacy submissions to the Department, and accurately reflects the information as submitted by the pharmacies.  This report was requested by: Milana Obeyndrea Roy   Reference #: [id number]   Others' Prescriptions  Patient Name: Cameron [name] Birth Date: June 28, 1996   Address: [address] [place], [place] [id number] Sex: Male   Rx Written Rx Dispensed Drug Quantity Days Supply Prescriber Name Payment Method Dispenser   03/12/2018 03/12/2018 oxycodone hcl 10 mg tablet  80 13 [name], Gwenith Spitziffany L MD [facility]   02/25/2018 02/25/2018 oxycodone hcl 10 mg tablet  80 13 [name], Gwenith Spitziffany L MD [facility]   02/13/2018 02/13/2018 oxycodone hcl 10 mg tablet  80 13 [name], [name] [facility]   01/21/2018 01/21/2018 oxycodone hcl 10 mg tablet  80 13 [name], [name] [facility]   01/08/2018 01/08/2018 oxycodone hcl 10 mg tablet  40 7 Lie, Ariadne Insurance [facility]   12/25/2017 12/25/2017 oxycodone hcl 10 mg tablet  40 7 [name], Gwenith Spitziffany L MD [facility]   12/04/2017 12/05/2017 oxycodone hcl 10 mg tablet  40 7 [name], Gwenith Spitziffany L MD [facility]   11/25/2017 11/26/2017 oxycodone hcl 10 mg tablet  40 6 [name], Gwenith Spitziffany L MD [facility]   11/14/2017 11/14/2017 oxycodone hcl 10 mg tablet  40 7 [name], [name] [facility]   11/01/2017 11/02/2017 oxycodone hcl 10 mg tablet  40 7  [name], Gwenith Spitziffany L MD [facility]   10/23/2017 10/25/2017 oxycodone hcl 10 mg tablet  40 7 [name], [name] Medicaid [facility]   10/10/2017 10/12/2017 oxycodone hcl 10 mg tablet  40 7 [name], [name] Medicaid [facility]   09/30/2017 10/02/2017 oxycodone hcl 10 mg tablet  40 7 [name], [name] Medicaid [facility]   09/18/2017 09/18/2017 oxycodone hcl 10 mg tablet  40 7 [name], [name] Cash [facility]   09/10/2017 09/10/2017 oxycodone hcl 10 mg tablet  40 7 [name], Gwenith Spitziffany L MD [facility]   09/02/2017 09/04/2017 oxycodone hcl 10 mg tablet  40 7 [name], [name] [facility]   08/26/2017 08/27/2017 oxycodone hcl 10 mg tablet  40 7 Karle BarrLie, Ariadne G MD Cash [facility]   08/09/2017 08/13/2017 oxycodone hcl 10 mg tablet  40 7 [name], Gwenith Spitziffany L MD [facility]   08/05/2017 08/06/2017 oxycodone hcl 10 mg tablet  40 7 [name], Gwenith Spitziffany L MD [facility]   07/02/2017 07/02/2017 oxycodone hcl 10 mg tablet  40 7 [name], Gwenith Spitziffany L MD [facility]   06/04/2017 06/04/2017 oxycodone hcl 10 mg tablet  40 7 [name], Gwenith Spitziffany L MD [facility]     Patient Name:  Cameron [name] Birth Date: [birth year]   Address: [address] [place], [place] [address] Sex: Male   Rx Written Rx Dispensed Drug Quantity Days Supply Prescriber Name Payment Method Dispenser   04/30/2017 04/30/2017 oxycodone hcl 10 mg tablet  40 7 [name], [name] MD [facility]   03/29/2017 03/29/2017 oxycodone hcl 10 mg tablet  40 7 [name], [name] [facility]   * - Drugs marked with an asterisk are compound drugs. If the compound drug is made up of more than one controlled substance, then each  controlled substance will be a separate row in the table.

## 2018-03-25 NOTE — Patient Instructions (Addendum)
Sickle cell pain:  Remember some things that can trigger your sickle cell crisis are: stress, illness, weather changes, dehydration.  Doing things to prevent pain crisis during these times are important  -oxycodone: 10mg  every 4 hours as needed for pain  -folic acid refill sent to pharmacy     -when you are flying: make sure you are staying well hydrated, getting up and walking around the plane about once an hour to make sure you don't get any blood clots.

## 2018-03-25 NOTE — Progress Notes (Signed)
[facility]  [address]  [place] [id number]  Phone: [phone]  Fax: [phone]    REASON FOR VISIT     Sickle Cell VOC    PRIMARY DIAGNOSIS   Sickle Cell disease, type  HgSS    SUBJECTIVE     Sickle cell disease    Patient states he is feeling well today and that his sickle cell is doing well. He states he has only had one sickle cell crisis in his life when he was in the 10th grade. His pain is 0/10 today.     When he experiences pain, it is normally in his back. 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 However, he likes working nights and says he is not interested in switching shifts at this time. He states his pain will increase to 6/10 sometimes when at work and is specifically in his back. He normally takes his oxy prn about twice a day with good relief to 0/10 on the pain scale. He feels comfortable and is satisfied with his current medication regimen.    He is going to school at [employer] for sports management and will graduate next May. He states his life with family and friends is going good. He is excited [place] for [place] Fourth of [place] holiday.    He states his appetite, urinary, and bowel movements are at baseline. Denies constipation and states his last bowel movement was yesterday morning.    Current home management:  Non pharmacologic interventions: Stretching  Short acting Opioid pain medications: 10 mg oxycodone IR every 4 hours as needed      PEG:  Prior (PEG) Pain Score:   Current Pain score:   Pain    03/25/18 1106   PainSc:   0 - No pain     Pain on average the last week: 7  Degree of interference with Enjoyment of life in last week: 3  How has pain interfered with your General activity in the last week: 5   PEG SCORE: 5    Emergency Room visits for this event:   Hospitalizations for this event: 1    Current disease modifying therapy:  none    Medications Reviewed and changes     Current Outpatient Prescriptions:     oxyCODONE (ROXICODONE) 10 MG immediate release tablet, Take 1 tablet (10 mg total) by mouth every 4 hours as needed for Pain.  Max daily dose: 6 tablets, Disp: 80 tablet, Rfl: 0    folic acid (FOLVITE) 1 MG tablet, Take 1 tablet (1 mg total) by mouth daily, Disp: 30 tablet, Rfl: 5    mesalamine (ASACOL HD) 800 MG tablet, Take 3 tablets (2,400 mg total) by mouth 2 times daily, Disp: 180 tablet, Rfl: 2    ferrous sulfate 325 (65 FE) MG tablet, Take 1 tablet (325 mg total) by mouth 2 times daily (with meals), Disp: 100 tablet, Rfl: 3    cholecalciferol (VITAMIN D) 1000 UNIT tablet, Take 4 tablets (4,000 Units total) by mouth daily, Disp: 120 tablet, Rfl: 5    clindamycin-benzoyl peroxide (BENZACLIN) gel, Apply topically 2 times daily, Disp: 25 g, Rfl: 5    naloxone (NARCAN) 4 mg/0.1 mL nasal spray, Instill 1 spray in 1 nostril once for opioid reversal. Repeat in alternating nostrils with new package every 2-3 minutes until response,  Disp: 2 each, Rfl: 5    Electronic Thermometer (DIGITAL THERMOMETER) MISC, Use as directed, Disp: 1 each, Rfl: 0  Review of Systems   Constitutional: Negative for activity change, appetite change, chills, diaphoresis, fatigue, fever and unexpected weight change.   HENT: Negative for congestion, postnasal drip and sore throat.    Eyes: Negative.    Respiratory: Negative.    Cardiovascular: Negative.    Gastrointestinal: Negative.    Endocrine: Negative.    Genitourinary: Negative.    Musculoskeletal: Positive for back pain (states is his baseline sickle cell pain). Negative for joint swelling.   Skin: Negative.    Allergic/Immunologic: Negative for environmental allergies.   Neurological: Negative for dizziness, weakness, numbness and headaches.   Hematological: Does not bruise/bleed easily.   Psychiatric/Behavioral: Negative for behavioral problems and sleep disturbance. The patient is not  nervous/anxious.      OBJECTIVE     BP 124/72    Pulse (!) 121    Ht 1.88 m (6' 2.02")    Wt 89.9 kg (198 lb 3.2 oz)    SpO2 100%    BMI 25.44 kg/m   Physical Exam   Constitutional: He is oriented to person, place, and time. He appears well-developed and well-nourished.   HENT:   Head: Normocephalic and atraumatic.   Eyes: Right eye exhibits no discharge. Left eye exhibits no discharge. No scleral icterus.   Cardiovascular: Normal rate, regular rhythm and normal heart sounds.    No murmur heard.  Pulmonary/Chest: Effort normal and breath sounds normal. He has no wheezes.   Abdominal: Soft. Bowel sounds are normal. He exhibits no distension. There is no tenderness. There is no guarding.   Musculoskeletal: Normal range of motion.   Neurological: He is alert and oriented to person, place, and time.   Skin: Skin is warm and dry. No rash noted. No erythema.   Psychiatric: He has a normal mood and affect. His behavior is normal. Judgment and thought content normal.           Lab results: 03/12/18  1150   WBC 4.9   Hemoglobin 5.3*   Hematocrit 20*   RBC 3.3*   Platelets 456*         ASSESSMENT / DIAGNOSIS     Cameron [name] was seen today for sickle cell pain crisis.    Diagnoses and all orders for this visit:    Hb-SS disease without crisis  Stable  Pain medications as prescribed  Symptoms management reviewed: good hydration by drinking lots of water, warm pads/warm baths, rest, stay out of cold weather, reduce stress/triggers  Education/discussion of assessment and plan tailored to patient's level of health literacy and communicated with patient       Other orders  -     oxyCODONE (ROXICODONE) 10 MG immediate release tablet; Take 1 tablet (10 mg total) by mouth every 4 hours as needed for Pain.  Max daily dose: 6 tablets  -     folic acid (FOLVITE) 1 MG tablet; Take 1 tablet (1 mg total) by mouth daily        Pain management treatment plan   Patient has co diagnosis of ulcerative colitis dx age 44.  In addition Blood type O+  with antibodies anti-fya,jkb,lea  Outpatient Pain Regimen:  Long acting: none  Short acting; Oxy IR 10mg  Q4 hrs prn pain  Home Based titration regimen: none, needs assessment due to uncontrolled UC0  Adjunct therapies: none, holding NSAID due to ongoing LGI bleeding  Other: none    Preferred hospital: [facility]    ED/Infusion Center care plan recommendations:  Toradol 30 mg IV Q6 hrs ATC  NS 2 L IV bolus  Transfuse for HcT <18  Hydromorphone 2 mg IV Q30 min prn pain score>5  Recommend hospitalization is pain score remains >5 after 3 doses of Hydromorphone 2 mg IV    Labs on arrival: CBC, retic, CHEM14  CXR if complaints of chest pain to evaluate for infiltrate consistent with acute chest syndrome  Assess for need for bronchodilator if complaints of chest pain    Inpatient care plan recommendations:  Toradol 30 mg IV Q6 hrs ATC  NS bolus until euvolemic followed by D51/2NS at 125 cc/hr until tolerating PO well  PCA: Start with demand only Hydromorophone PCA 1 mg Q20 min with lockout 12 mg over 4 hrs Recommend titration of pain regimen until achieving pain score <5     Prior to discharge recommend 24 hours of PO pain medications to assess for sx of withdrawal with transition and continued pain control to prevent readmission    Transfuse if HcT< 18 and consider GI consult as patient's most recent cause for profound anemia is due to UC        Patient Instructions   Sickle cell pain:  Remember some things that can trigger your sickle cell crisis are: stress, illness, weather changes, dehydration.  Doing things to prevent pain crisis during these times are important  -oxycodone: 10mg  every 4 hours as needed for pain  -folic acid refill sent to pharmacy     -when you are flying: make sure you are staying well hydrated, getting up and walking around the plane about once an hour to make sure you don't get any blood clots.       No Follow-up on file.  --Patient instructed to call if symptoms are not improving or  worsening      Electronically signed by Jodi Geralds, NP 03/25/2018   10:55 PM  [facility], Phone: [phone]

## 2018-03-30 NOTE — Progress Notes (Signed)
[facility]  [address]  [place] [address]  Phone: [phone]  Fax: [phone]    REASON FOR VISIT     Follow up for complex situation involving new ulcerative colitis diagnosis in the setting of chronic HgSS.    PRIMARY DIAGNOSIS     HgSS with HPFH, Ulcerative Colitis     SUBJECTIVE     Left knee pain:  Started about 2 weeks ago  Unsure of any acute injury or strain, no pain over medial aspect of knee more superiorly  No swelling  No deformity  No bruising  Does not feel like VOC, more joint/muscle pain like after a sports injury  Very concerned about this pain    Ulcerative Colitis  Inconsistent with mesalamine dosing, admits taking it about 2-3 times per week and has been making more effort to be consistent with dosing     HgSS  -Has been having b/l low back pain more over the last month  - feels consistent with his SCD pain and worse with increased activity usually after a day at work needs to take his oxy IR to sleep comfortably  - has been told to avoid NSAID due to UC  - decreased energy levels  - trying to take ferrous sulfate more consistnetly    Social  Mother considering move to [place] at the end of the year, Kashon is not sure what he is going to do, exploring options for college in [place] states, 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        Review of Systems as per HPI above      OBJECTIVE     BP 128/68     Pulse 87    Ht 1.88 m (6\' 2" )    Wt 88.2 kg (194 lb 8 oz)    SpO2 100%    BMI 24.97 kg/m       Physical Exam   Constitutional: He is oriented to person, place, and time and well-developed, well-nourished, and in no distress.   HENT:   Mucosal and nail bed pallor   Abdominal:   Hyperactive BS   Musculoskeletal:        Left knee: He exhibits normal range of motion, no swelling, no effusion, no bony tenderness and normal meniscus. Tenderness (tenderness over quad insertion of uad) found. No medial joint line, no lateral joint line, no MCL, no LCL and no patellar tendon tenderness noted.   Neurological: He is alert and oriented to person, place, and time.   Skin: There is pallor (mucosa).   Psychiatric: Mood, affect and judgment normal.         Lab results: 03/12/18  1150   WBC 4.9   Hemoglobin 5.3*   Hematocrit 20*   RBC 3.3*   Platelets 456*     Ferritin   Date Value Ref Range Status  12/25/2017 6 (L) 20 - 250 ng/mL Final       ASSESSMENT / DIAGNOSIS     22 yo with Hg SS disease with high HgF dx with UC in the last year delaying entry to college. Here today with concerns about left knee pain that appears on exam most consistent with quadricept strain/tendonitis    Sickle Cell Disease  -Appears to be doing well.    - discussed engagement with PT for back pain especially given now quad injury which may be related to deconditioning   --CBC drawn today, concerned given persistent iron deficiency due to poor adherence with treatment regimen of ferrous sulfate supplementation  -Continue folate and oxycodone 10mg  q4H prn    UC   -reviewed need to be consistent with mesalamine dosing - unfortunately requires twice daily adherence to medication,  -Reiterated need for fecal lactoferrin testing to assess disease control   -Continue Vit D 4000u daily    Left knee pain:  Likely quad strain vs tendonitis  Reviewed exercises in office with patient and given printed directions    --Patient instructed to call if symptoms are not  improving or worsening  --Follow-up arranged  Return in about 2 weeks (around 02/26/2018) for SCD f/u.    Electronically signed by Benay PillowIFFANY L Antwion Carpenter, MD , 03/30/2018 @   [facility], Phone: [phone]

## 2018-03-31 ENCOUNTER — Ambulatory Visit: Payer: Medicaid Other | Admitting: Internal Medicine

## 2018-04-09 ENCOUNTER — Ambulatory Visit: Payer: Medicaid Other | Attending: Internal Medicine | Admitting: Internal Medicine

## 2018-04-09 ENCOUNTER — Encounter: Payer: Self-pay | Admitting: Internal Medicine

## 2018-04-09 VITALS — BP 128/70 | HR 90 | Ht 74.02 in | Wt 191.1 lb

## 2018-04-09 DIAGNOSIS — M549 Dorsalgia, unspecified: Secondary | ICD-10-CM | POA: Insufficient documentation

## 2018-04-09 DIAGNOSIS — D571 Sickle-cell disease without crisis: Secondary | ICD-10-CM | POA: Insufficient documentation

## 2018-04-09 DIAGNOSIS — K5100 Ulcerative (chronic) pancolitis without complications: Secondary | ICD-10-CM | POA: Insufficient documentation

## 2018-04-09 LAB — PAIN CLINIC PROFILE
Amphetamine,UR: NEGATIVE
Benzodiazepinen,UR: NEGATIVE
Cocaine/Metab,UR: NEGATIVE
Opiates,UR: NEGATIVE
Oxycodone/Oxymorphone,UR: NEGATIVE
THC Metabolite,UR: NEGATIVE

## 2018-04-09 MED ORDER — POLYETHYLENE GLYCOL 3350 PO PACK 17 GM *I*
17.0000 g | PACK | Freq: Every day | ORAL | 5 refills | Status: DC
Start: 2018-04-09 — End: 2018-04-29

## 2018-04-09 MED ORDER — CYCLOBENZAPRINE HCL 10 MG PO TABS *I*
10.0000 mg | ORAL_TABLET | Freq: Three times a day (TID) | ORAL | 0 refills | Status: DC | PRN
Start: 2018-04-09 — End: 2019-11-03

## 2018-04-09 MED ORDER — OXYCODONE HCL 10 MG PO TABS *I*
ORAL_TABLET | ORAL | 0 refills | Status: DC
Start: 2018-04-09 — End: 2018-04-29

## 2018-04-09 NOTE — Patient Instructions (Addendum)
-  Tylenol: try and take this more consistently 1000mg  three times a day, this will help with the overall inflammation which will help more with back pain and ultimately make your sickle cell pain better  -try flexeril 10mg  three times a day as needed for back pain. This is a muscle relaxer and may make you sleepy, so avoid driving after taking this. Try it at night first to see how you react to it. Try taking this for back pain first and see if you can avoid taking oxycodone.  -oxycodone 10mg  every 4 hours as needed for sickle cell pain  -mirilax: this is a powder to help you have more regular bowel movements. Mix this with 8ounces of water or your favorite drink. Take it about 1x/day for hard stools or if you dont have a bowel movement in 1 day. I dont want you to get constipated, this can also be a cause for back pain.     -Remember some things that can trigger your sickle cell crisis are: stress, illness, weather changes, dehydration.  Doing things to prevent pain crisis during these times are important

## 2018-04-09 NOTE — Progress Notes (Signed)
Cameron [name] - 16- 23 Prescriptions  Confidential Drug Report  Search Terms: Cameron [name], 04-Apr-1996   Search Date: 04/09/2018 10:42:25 AM   Searching on behalf of: [id number] - Roselind RilyKristine Reinhardt   The Drug Utilization Report below displays all of the controlled substance prescriptions, if any, that your patient has filled in the last twelve months. The information displayed on this report is compiled from pharmacy submissions to the Department, and accurately reflects the information as submitted by the pharmacies.  This report was requested by: Milana Obeyndrea Roy   Reference #: [id number]   Others' Prescriptions  Patient Name: Cameron [name] Birth Date: 04-Apr-1996   Address: [address] [place], [place] [id number] Sex: Male   Rx Written Rx Dispensed Drug Quantity Days Supply Prescriber Name Payment Method Dispenser   03/25/2018 03/25/2018 oxycodone hcl 10 mg tablet  80 14 [facility] CenterReinhardt, Barkley BrunsKristine M [facility]   03/12/2018 03/12/2018 oxycodone hcl 10 mg tablet  80 13 [name], Gwenith Spitziffany L MD Insurance [facility]   02/25/2018 02/25/2018 oxycodone hcl 10 mg tablet  80 13 [name], Gwenith Spitziffany L MD [facility]   02/13/2018 02/13/2018 oxycodone hcl 10 mg tablet  80 13 [name], [name] [facility]   01/21/2018 01/21/2018 oxycodone hcl 10 mg tablet  80 13 [name], [name] [facility]   01/08/2018 01/08/2018 oxycodone hcl 10 mg tablet  40 7 Lie, Ariadne [facility]   12/25/2017 12/25/2017 oxycodone hcl 10 mg tablet  40 7 [name], Gwenith Spitziffany L MD Insurance [facility]   12/04/2017 12/05/2017 oxycodone hcl 10 mg tablet  40 7 [name], Gwenith Spitziffany L MD [facility]   11/25/2017 11/26/2017 oxycodone hcl 10 mg tablet  40 6 [name], Gwenith Spitziffany L MD [facility]   11/14/2017 11/14/2017 oxycodone hcl 10 mg tablet  40 7  [name], [name] [facility]   11/01/2017 11/02/2017 oxycodone hcl 10 mg tablet  40 7 [name], Gwenith Spitziffany L MD [facility]   10/23/2017 10/25/2017 oxycodone hcl 10 mg tablet  40 7 [name], [name] Medicaid [facility]   10/10/2017 10/12/2017 oxycodone hcl 10 mg tablet  40 7 [name], [name] Medicaid [facility]   09/30/2017 10/02/2017 oxycodone hcl 10 mg tablet  40 7 [name], [name] Medicaid [facility]   09/18/2017 09/18/2017 oxycodone hcl 10 mg tablet  40 7 [name], [name] Cash [facility]   09/10/2017 09/10/2017 oxycodone hcl 10 mg tablet  40 7 [name], Gwenith Spitziffany L MD Insurance [facility]   09/02/2017 09/04/2017 oxycodone hcl 10 mg tablet  40 7 [name], [name] Cash [facility]   08/26/2017 08/27/2017 oxycodone hcl 10 mg tablet  40 7 Karle BarrLie, Ariadne G MD Cash [facility]   08/09/2017 08/13/2017 oxycodone hcl 10 mg tablet  40 7 [name], Gwenith Spitziffany L MD Insurance [facility]   08/05/2017 08/06/2017 oxycodone hcl 10 mg tablet  40 7 [name], Gwenith Spitziffany L MD Insurance [facility]   07/02/2017 07/02/2017 oxycodone hcl 10 mg tablet  40 7 [name], Gwenith Spitziffany L MD Insurance [facility]   06/04/2017 06/04/2017 oxycodone hcl 10  mg tablet  40 7 [name], [name] MD Insurance [facility]     Patient Name: Cameron [name] Birth Date: May 04, 1996   Address: [address] [place], [place] [address] Sex: Male   Rx Written Rx Dispensed Drug Quantity Days Supply Prescriber Name Payment Method Dispenser   04/30/2017 04/30/2017 oxycodone hcl 10 mg tablet  40 7 [name], [name] Insurance [facility] [place] [facility]   * - Drugs marked with an asterisk are compound drugs. If the compound drug is made up of more than one controlled substance, then each  controlled substance will be a separate row in the table.

## 2018-04-09 NOTE — Progress Notes (Signed)
[facility]  [address]  [place] [address]  Phone: [phone]  Fax: [phone]    REASON FOR VISIT     Follow up     PRIMARY DIAGNOSIS     Sickle cell disease     SUBJECTIVE       Sickle cell pain   "pain is up and down"  Just returned from [place], had a good trip the hot weather bothered him   Typical sickle cell pain in his back   Middle/lower back pain, when he is sitting and standing in certain ways  Denies any trauma or incrase in physical injury to his back   Pain meds: oxycodone 10mg  twice a day: every other day he will need this , this helps   He will take tylenol or aleve at times  Cant take ibuprofen d/t colitis, no one has ever   He doesn't do anything else for his back pain   Pain has been around his baseline   Denies fever, chest pain, SOB, abdominal pain,   BMs daily and soft, denies constipation but states he is "not pooping as much as normal" doesn't take any stool softener     Social:  In school but taking a semester off and will start again in the spring  Working at [employer] in [place]      Review of Systems   All other systems reviewed and are negative.    Review of Systems as per HPI above    Past Medical History, Surgical History, Social History, Family History, and Medications/allergies reviewed during this visit    Current Outpatient Prescriptions   Medication    oxyCODONE (ROXICODONE) 10 MG immediate release tablet    folic acid (FOLVITE) 1 MG tablet    mesalamine (ASACOL HD) 800 MG tablet    ferrous sulfate 325 (65 FE) MG tablet    cholecalciferol (VITAMIN D) 1000 UNIT tablet    clindamycin-benzoyl peroxide (BENZACLIN) gel    naloxone (NARCAN) 4 mg/0.1 mL nasal spray    Electronic Thermometer (DIGITAL THERMOMETER) MISC    cyclobenzaprine (FLEXERIL) 10 MG tablet    polyethylene glycol (GLYCOLAX,MIRALAX) powder packet     No current facility-administered medications for this visit.          Medications reviewed at today's visit    OBJECTIVE     BP  128/70    Pulse 90    Ht 1.88 m (6' 2.02")    Wt 86.7 kg (191 lb 1.6 oz)    SpO2 100%    BMI 24.53 kg/m     Physical Exam   Constitutional: He is oriented to person, place, and time and well-developed, well-nourished, and in no distress. No distress.   HENT:   Head: Normocephalic and atraumatic.   Mouth/Throat: Oropharynx is clear and moist.   Eyes: Pupils are equal, round, and reactive to light. EOM are normal. No scleral icterus.   Neck: Normal range of motion. Neck supple.   Cardiovascular: Normal rate, regular rhythm and normal heart sounds.    Pulmonary/Chest: Effort normal and breath sounds normal. No respiratory distress. He has no wheezes. He has no rales.   Abdominal: Soft. Bowel sounds are normal. He exhibits no distension. There is no tenderness.   Musculoskeletal: Normal range of motion. He exhibits no edema or deformity.   Neurological: He is alert and oriented to person, place, and time. Gait normal.   Skin: Skin is warm and dry. No rash noted. He is  not diaphoretic. No erythema. No pallor.   Psychiatric: Mood and affect normal.   Vitals reviewed.        ASSESSMENT / DIAGNOSIS     Orders Placed This Encounter    Pain clinic profile    oxyCODONE (ROXICODONE) 10 MG immediate release tablet    cyclobenzaprine (FLEXERIL) 10 MG tablet    polyethylene glycol (GLYCOLAX,MIRALAX) powder packet         1. Sickle cell disease  Symptoms management reviewed: good hydration by drinking lots of water, warm pads/warm baths, rest, stay out of cold weather, reduce stress/triggers  -continue oxycodone 10mg  Q4hrs PRN  -tylenol 1000mg  TID   -mirilax 17g daily- recommended to take consistently for a couple of days and then go to PRN.   - Pain clinic profile; Future    2. Back pain, unspecified back location, unspecified back pain laterality, unspecified chronicity  Pain is located along para-spinous muscles, Likely musculoskeletal that is causing increase in sickle cell pain  -tylenol 1000mg  TID   -flexeril 10mg  TID  PRN: educated on side effects of this medication  -consider that constipation could be related to back pain, recommended mirilax daily for a few days to help "clean out" system and see if back pain improves.   -Symptom management discussed. Patient preferences addressed. Barriers to treatment goals addressed.        3. Ulcerative pancolitis without complication  Underlying condition, all treatment decisions made with this in consideration    -educated to avoid NSAIDs         Patient Instructions   -Tylenol: try and take this more consistently 1000mg  three times a day, this will help with the overall inflammation which will help more with back pain and ultimately make your sickle cell pain better  -try flexeril 10mg  three times a day as needed for back pain. This is a muscle relaxer and may make you sleepy, so avoid driving after taking this. Try it at night first to see how you react to it. Try taking this for back pain first and see if you can avoid taking oxycodone.  -oxycodone 10mg  every 4 hours as needed for sickle cell pain  -mirilax: this is a powder to help you have more regular bowel movements. Mix this with 8ounces of water or your favorite drink. Take it about 1x/day for hard stools or if you dont have a bowel movement in 1 day. I dont want you to get constipated, this can also be a cause for back pain.     -Remember some things that can trigger your sickle cell crisis are: stress, illness, weather changes, dehydration.  Doing things to prevent pain crisis during these times are important          --Patient instructed to call if symptoms are not improving or worsening  --Follow-up arranged  No Follow-up on file.    Electronically signed by Jodi GeraldsKristine M , NP , 04/09/2018 @   [facility], Phone: [phone]

## 2018-04-15 NOTE — Progress Notes (Signed)
Nonah Mattes III  Age: 22 y.o.  DOB: Feb 25, 1996  MRN: [id number]  [place] [id number]  Preferred language: ENGLISH    Referring Provider: PCP Dr. Derry Skill  Reason: Sickle cell  Assessment: Pt also has ulcerative colitis, 03/12/18 PCP note CHART REVIEW    Labs:   CBC with Diff       Lab results: 03/12/18  1150 02/25/18  1918 02/12/18  1608   WBC 4.9 6.6 5.0   Hemoglobin 5.3* 5.7* 5.2*   Hematocrit 20* 21* 19*   RBC 3.3* 3.4* 3.3*   Platelets 456* 283 401*   Neut # K/uL 2.8 4.5 2.9   Lymph # K/uL 1.2* 1.3 1.3   Mono # K/uL 0.5 0.4 0.6   Eos # K/uL 0.3 0.4 0.2   Baso # K/uL 0.0 0.0 0.0   Seg Neut % 58.2 68.2 57.3   Lymphocyte % 24.9 19.0 26.5   Monocyte % 9.9 6.5 11.0   Eosinophil % 6.0 5.7 4.6   Basophil % 0.4 0.3 0.4            CMP       Lab results: 10/09/17  1314   Sodium 137   Potassium 4.6   Chloride 101   CO2 26   UN 6   Creatinine 0.73   GFR,Caucasian 133   GFR,Black 154   Glucose 71   Calcium 8.5*   Total Protein 8.3*   Albumin 3.4*   ALT 42   AST 24   Alk Phos 208*   Bilirubin,Total 0.7        Iron Studies       Lab results: 12/25/17  1930 10/09/17  1314 09/09/17  0619   Iron 23* 23* 42*   Transferrin  --   --  209   TIBC 404 358 302   Transferrin Saturation 6* 6* 14*   Ferritin 6* 10* 26   Folate  --  4.5*  --    Vitamin B12  --  356  --       Coags       Lab results: 09/08/17  1614   Protime 16.9*   INR 1.5*        Imaging:   CT Angio   CT Angio Chest: No results found.   CT Angio Head: No results found.   US Doppler vein   US Doppler Vein Lower Extremities: No results found.   US Doppler Vein Upper Extremities: No results found.         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  Max daily dose: 6 tablets  84 tablet 0    cyclobenzaprine (FLEXERIL) 10 MG tablet Take 1 tablet (10 mg total) by mouth 3 times daily as needed for Muscle spasms 45 tablet 0    polyethylene glycol (GLYCOLAX,MIRALAX) powder packet Take 17 g by mouth daily 440 g 5    folic acid (FOLVITE) 1 MG tablet Take 1 tablet (1 mg total) by mouth daily 30 tablet 5    mesalamine (ASACOL HD) 800 MG tablet Take 3 tablets (2,400 mg total) by mouth 2 times daily  180 tablet 2    ferrous sulfate 325 (65 FE) MG tablet Take 1 tablet (325 mg total) by mouth 2 times daily (with meals) 100 tablet 3    cholecalciferol (VITAMIN D) 1000 UNIT tablet Take 4 tablets (4,000 Units total) by mouth daily 120 tablet 5    clindamycin-benzoyl peroxide (BENZACLIN) gel Apply topically 2 times daily 25 g 5    naloxone (NARCAN) 4 mg/0.1 mL nasal spray Instill 1 spray in 1 nostril once for opioid reversal. Repeat in alternating nostrils with new package every 2-3 minutes until response 2 each 5    Electronic Thermometer (DIGITAL THERMOMETER) MISC Use as directed 1 each 0     No current facility-administered medications on file prior to visit.

## 2018-04-18 ENCOUNTER — Telehealth: Payer: Self-pay

## 2018-04-18 NOTE — Telephone Encounter (Signed)
Called pt to remind him of upcoming appt with hematology.  Gave date/time/location of appt.

## 2018-04-23 ENCOUNTER — Ambulatory Visit: Payer: Medicaid Other | Admitting: Hematology and Oncology

## 2018-04-29 ENCOUNTER — Other Ambulatory Visit: Payer: Self-pay | Admitting: Internal Medicine

## 2018-04-29 ENCOUNTER — Encounter: Payer: Self-pay | Admitting: Student in an Organized Health Care Education/Training Program

## 2018-04-29 ENCOUNTER — Telehealth: Payer: Self-pay | Admitting: Primary Care

## 2018-04-29 ENCOUNTER — Ambulatory Visit: Payer: Medicaid Other | Attending: Primary Care | Admitting: Primary Care

## 2018-04-29 ENCOUNTER — Ambulatory Visit
Admission: RE | Admit: 2018-04-29 | Discharge: 2018-04-29 | Disposition: A | Discharge status: Home / Self Care | Payer: Medicaid Other | Source: Ambulatory Visit | Attending: Student in an Organized Health Care Education/Training Program | Admitting: Student in an Organized Health Care Education/Training Program

## 2018-04-29 VITALS — BP 120/62 | HR 107 | Ht 73.0 in | Wt 186.1 lb

## 2018-04-29 VITALS — BP 102/58 | HR 84 | Ht 74.02 in | Wt 185.3 lb

## 2018-04-29 DIAGNOSIS — K08409 Partial loss of teeth, unspecified cause, unspecified class: Secondary | ICD-10-CM

## 2018-04-29 DIAGNOSIS — D649 Anemia, unspecified: Secondary | ICD-10-CM

## 2018-04-29 DIAGNOSIS — K5100 Ulcerative (chronic) pancolitis without complications: Secondary | ICD-10-CM

## 2018-04-29 DIAGNOSIS — D571 Sickle-cell disease without crisis: Secondary | ICD-10-CM

## 2018-04-29 DIAGNOSIS — K5190 Ulcerative colitis, unspecified, without complications: Secondary | ICD-10-CM

## 2018-04-29 MED ORDER — FERROUS SULFATE 325 (65 FE) MG PO TABS *WRAPPED* *I*
325.0000 mg | ORAL_TABLET | Freq: Two times a day (BID) | ORAL | 3 refills | Status: DC
Start: 2018-04-29 — End: 2021-10-18

## 2018-04-29 MED ORDER — MESALAMINE 800 MG PO TBEC *I*
2400.0000 mg | DELAYED_RELEASE_TABLET | Freq: Two times a day (BID) | ORAL | 2 refills | Status: DC
Start: 2018-04-29 — End: 2018-06-26

## 2018-04-29 NOTE — Progress Notes (Signed)
[facility]  [address]  [place] [address]  Phone: [phone]  Fax: [phone]    REASON FOR VISIT     Sickle Cell VOC    PRIMARY DIAGNOSIS   Sickle Cell disease, type  HgSS    SUBJECTIVE     Sickle cell disease, HgSS high HgF:  Feeling improved since return from [place] and has returned to work.  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 and has been taking oxy IR BID on work days and otherwise once a day if he is playing basketball  Attempting to stay hydrated in heat and feeling generally good with good energy      Current home management:  Non pharmacologic interventions: rest, heat, fluids   Non opioid pain modifying medications: unable to take NSAID or acetaminophen per GI  Short acting Opioid pain medications: oxy IR 10 mg Q 4 hr  Long acting Opioid pain medications: none    Current Pain score:   Pain    04/29/18 0933   PainSc:   4     Emergency Room visits for this event: 0  Hospitalizations for this event: 0    Current disease modifying therapy: none      Ulcerative colitis:  Continues on asacol and most days able to take twice daily dosing  Having a stool in the morning and late at night  No urgency with meals  No blood in stool  No abdominal cramps  Continues on iron and vit D - taking iron supplementation about twice to three times per week    Dental issues;  Had pain in lower right and went to [facility] urgent care and was scheduled for wisdom tooth extraction which he underwent yesterday  Having some discomfort  Cannot recall last cleaning and has not been receiving consistent care for oral health     Medications Reviewed and changes     Current Outpatient Prescriptions:     mesalamine (ASACOL HD) 800 MG tablet, Take 3 tablets (2,400 mg total) by mouth 2 times daily, Disp: 180 tablet, Rfl: 2    ferrous sulfate 325 (65 FE) MG tablet, Take 1 tablet (325 mg total) by mouth 2 times daily (with meals), Disp: 100 tablet, Rfl: 3    oxyCODONE (ROXICODONE) 10  MG immediate release tablet, Take 1 tablet (10 mg total) by mouth every 4 hours as needed for Pain.  Max daily dose: 6 tablets, Disp: 84 tablet, Rfl: 0    cyclobenzaprine (FLEXERIL) 10 MG tablet, Take 1 tablet (10 mg total) by mouth 3 times daily as needed for Muscle spasms, Disp: 45 tablet, Rfl: 0    folic acid (FOLVITE) 1 MG tablet, Take 1 tablet (1 mg total) by mouth daily, Disp: 30 tablet, Rfl: 5    cholecalciferol (VITAMIN D) 1000 UNIT tablet, Take 4 tablets (4,000 Units total) by mouth daily, Disp: 120 tablet, Rfl: 5    clindamycin-benzoyl peroxide (BENZACLIN) gel, Apply topically 2 times daily, Disp: 25 g, Rfl: 5    naloxone (NARCAN) 4 mg/0.1 mL nasal spray, Instill 1 spray in 1 nostril once for opioid reversal. Repeat in alternating nostrils with new package every 2-3 minutes until response, Disp: 2 each, Rfl: 5  Review of Systems  OBJECTIVE     BP 102/58    Pulse 84    Ht 1.88 m (6' 2.02")    Wt 84.1 kg (185 lb 4.8 oz)    SpO2 96%  BMI 23.78 kg/m   Physical Exam   Constitutional: He is oriented to person, place, and time. He appears well-developed and well-nourished.   HENT:   Right lower jaw with s/p extraction site without bleeding or swelling   Eyes: No scleral icterus.   Cardiovascular: Normal rate, regular rhythm and normal heart sounds.    Pulmonary/Chest: Effort normal and breath sounds normal. No respiratory distress.   Neurological: He is alert and oriented to person, place, and time.   Skin:   Pallor of nail beds           Lab results: 03/12/18  1150   WBC 4.9   Hemoglobin 5.3*   Hematocrit 20*   RBC 3.3*   Platelets 456*         ASSESSMENT / DIAGNOSIS     Demarquis was seen today for f/u of SCD, UC, and acute dental issue doing generally well with management of two chronic medical issues with plan to move to [place] in February of 2020 and plan to take classes at [employer] again this fall.    Diagnoses and all orders for this visit:    Sickle cell disease  Continue current management for  intermittent pain.  Offered support of PT again and he will consider  Pain agreement UTD  Does not require hydroxyurea at this point although if back pain is related to his SCD related VOC events may benefit from it - would recommend a course of PT prior to this decision as I suspect there is a component of muscular dysfunction contribution to this discomfort    UC:   Appointment with Dr. Nedra Hai this afternoon reviewed  CBC and iron studies ordered to f/u - hold on lab draw pending request for labs by Dr. Nedra Hai  Continue mesalamine 96045 mg BID, patient making significant strides in improving taking medication routinely   Continue on ferrous sulfate pending results of iron studies, taking ferrous sulfate intermittently throughout the last 6 months  Stool output improved but last evaluation of stool lactoferrin elevated was done prior to improved adherence to management with mesalamine    Dental issue:  Seen today by [facility] dental team with assessment of extraction of wisdom tooth and formulation of care plan with follow up appointments made  Continue f/u with dental and will coordinate with medical visits  Reviewed importance of oral health care, especially in the setting of SCD    Other orders  -     mesalamine (ASACOL HD) 800 MG tablet; Take 3 tablets (2,400 mg total) by mouth 2 times daily  -     ferrous sulfate 325 (65 FE) MG tablet; Take 1 tablet (325 mg total) by mouth 2 times daily (with meals)        Pain management treatment plan   Patient has co diagnosis of ulcerative colitis dx age 22.  In addition Blood type O+ with antibodies anti-fya,jkb,lea  Outpatient Pain Regimen:  Long acting: none  Short acting; Oxy IR 10mg  Q4 hrs prn pain  Home Based titration regimen: none, needs assessment due to uncontrolled UC0  Adjunct therapies: none, holding NSAID due to ongoing LGI bleeding  Other: none    [facility]    ED/Infusion Center care plan recommendations:  Toradol 30 mg IV Q6 hrs ATC  NS 2 L IV  bolus  Transfuse for HcT <18  Hydromorphone 2 mg IV Q30 min prn pain score>5  Recommend hospitalization is pain score remains >5 after 3 doses of  Hydromorphone 2 mg IV    Labs on arrival: CBC, retic, CHEM14  CXR if complaints of chest pain to evaluate for infiltrate consistent with acute chest syndrome  Assess for need for bronchodilator if complaints of chest pain    Inpatient care plan recommendations:  Toradol 30 mg IV Q6 hrs ATC  NS bolus until euvolemic followed by D51/2NS at 125 cc/hr until tolerating PO well  PCA: Start with demand only Hydromorophone PCA 1 mg Q20 min with lockout 12 mg over 4 hrs Recommend titration of pain regimen until achieving pain score <5     Prior to discharge recommend 24 hours of PO pain medications to assess for sx of withdrawal with transition and continued pain control to prevent readmission    Transfuse if HcT< 18 and consider GI consult as patient's most recent cause for profound anemia is due to UC        There are no Patient Instructions on file for this visit.    Return in about 1 month (around 05/30/2018) for SCD VOC.  --Patient instructed to call if symptoms are not improving or worsening      Electronically signed by Benay PillowIFFANY L Johnathon Olden, MD 04/29/2018   11:48 AM  [facility], Phone: [phone]

## 2018-04-29 NOTE — Telephone Encounter (Signed)
Spoke to [facility]. French Anaracy inquired if the patients medication will be sent to the pharmacy today.

## 2018-04-29 NOTE — Telephone Encounter (Signed)
[facility]on [facility] - 45- 24 Prescriptions  Confidential Drug Report  Search Terms: Cameron BachelorLucius Barreras, 1996-04-19   Search Date: 04/29/2018 04:05:17 PM   Searching on behalf of: [id number] - Tiffany L [name]   The Drug Utilization Report below displays all of the controlled substance prescriptions, if any, that your patient has filled in the last twelve months. The information displayed on this report is compiled from pharmacy submissions to the Department, and accurately reflects the information as submitted by the pharmacies.  This report was requested by: Laruth BouchardJennifer Natassja Ollis   Reference #: [id number]   Others' Prescriptions  Patient Name: Cameron [name] Birth Date: 1996-04-19   Address: [address] [place], [place] [id number] Sex: Male   Rx Written Rx Dispensed Drug Quantity Days Supply Prescriber Name Payment Method Dispenser   04/09/2018 04/09/2018 oxycodone hcl 10 mg tablet  84 14 [place], Barkley BrunsKristine M [facility]   03/25/2018 03/25/2018 oxycodone hcl 10 mg tablet  80 14 Roselind Rilyeinhardt, Kristine M [facility]   03/12/2018 03/12/2018 oxycodone hcl 10 mg tablet  80 13 [name], Gwenith Spitziffany L MD [facility]   02/25/2018 02/25/2018 oxycodone hcl 10 mg tablet  80 13 [name], Gwenith Spitziffany L MD Insurance [facility]   02/13/2018 02/13/2018 oxycodone hcl 10 mg tablet  80 13 [name], [name] [facility]   01/21/2018 01/21/2018 oxycodone hcl 10 mg tablet  80 13 [name], [name] [facility]   01/08/2018 01/08/2018 oxycodone hcl 10 mg tablet  40 7 Lie, Ariadne [facility]   12/25/2017 12/25/2017 oxycodone hcl 10 mg tablet  40 7 [name], Gwenith Spitziffany L MD Insurance [facility]   12/04/2017 12/05/2017 oxycodone hcl 10 mg tablet  40 7 [name], Gwenith Spitziffany L MD Insurance [facility]   11/25/2017 11/26/2017 oxycodone hcl 10 mg tablet   40 6 [name], Gwenith Spitziffany L MD Insurance [facility]   11/14/2017 11/14/2017 oxycodone hcl 10 mg tablet  40 7 [name], [name] Insurance [facility]   11/01/2017 11/02/2017 oxycodone hcl 10 mg tablet  40 7 [name], Gwenith Spitziffany L MD Insurance [facility]   10/23/2017 10/25/2017 oxycodone hcl 10 mg tablet  40 7 [name], [name] Medicaid [facility]   10/10/2017 10/12/2017 oxycodone hcl 10 mg tablet  40 7 [name], [name] Medicaid [facility]   09/30/2017 10/02/2017 oxycodone hcl 10 mg tablet  40 7 [name], [name] Medicaid [facility]   09/18/2017 09/18/2017 oxycodone hcl 10 mg tablet  40 7 [name], [name] Cash [facility]   09/10/2017 09/10/2017 oxycodone hcl 10 mg tablet  40 7 [name], Gwenith Spitziffany L MD Insurance [facility]   09/02/2017 09/04/2017 oxycodone hcl 10 mg tablet  40 7 [name], [name] [facility]   08/26/2017 08/27/2017 oxycodone hcl 10 mg tablet  40 7 Karle BarrLie, Ariadne G MD [facility]   08/09/2017 08/13/2017 oxycodone hcl 10 mg tablet  40 7 [name], Gwenith Spitziffany L MD Insurance [facility]   08/05/2017 08/06/2017 oxycodone hcl 10 mg tablet  40 7 [name], Gwenith Spitziffany L MD Insurance [facility]   07/02/2017 07/02/2017 oxycodone hcl 10  mg tablet  40 7 [name], [name] MD Insurance [facility]   06/04/2017 06/04/2017 oxycodone hcl 10 mg tablet  40 7 [name], [name] MD Insurance [facility]     Patient Name: Cameron [name] Birth Date: 1996-08-02   Address: [address] [place], [place] [address] Sex: Male   Rx Written Rx Dispensed Drug Quantity Days Supply Prescriber Name Payment Method Dispenser   04/30/2017 04/30/2017 oxycodone hcl 10 mg tablet  40 7 [name], [name] Insurance [facility] [place] [facility]   * - Drugs marked  with an asterisk are compound drugs. If the compound drug is made up of more than one controlled substance, then each controlled substance will be a separate row in the table.

## 2018-04-29 NOTE — Progress Notes (Signed)
Patient Name: Cameron [name] Birth Date: 10/11/1995   Address: [address] [place], [place] [id number] Sex: Male   Rx Written Rx Dispensed Drug Quantity Days Supply Prescriber Name Payment Method Dispenser   04/09/2018 04/09/2018 oxycodone hcl 10 mg tablet  84 14 PampaReinhardt, Barkley BrunsKristine M [facility]   03/25/2018 03/25/2018 oxycodone hcl 10 mg tablet  80 14 RollaReinhardt, Barkley BrunsKristine M Insurance [facility] [place] [facility]   03/12/2018 03/12/2018 oxycodone hcl 10 mg tablet  80 13 [name], Gwenith Spitziffany L MD Insurance [facility] [facility]   02/25/2018 02/25/2018 oxycodone hcl 10 mg tablet  80 13 [name], Gwenith Spitziffany L MD Insurance [facility]   02/13/2018 02/13/2018 oxycodone hcl 10 mg tablet  80 13 [name], [name] Insurance [facility] [facility]   01/21/2018 01/21/2018 oxycodone hcl 10 mg tablet  80 13 [name], [name] Insurance [facility]   01/08/2018 01/08/2018 oxycodone hcl 10 mg tablet  40 7 Lie, Ariadne Insurance [facility]   12/25/2017 12/25/2017 oxycodone hcl 10 mg tablet  40 7 [name], Gwenith Spitziffany L MD Insurance [facility] [facility]   12/04/2017 12/05/2017 oxycodone hcl 10 mg tablet  40 7 [name], Gwenith Spitziffany L MD Insurance [facility] [facility]   11/25/2017 11/26/2017 oxycodone hcl 10 mg tablet  40 6 [name], Gwenith Spitziffany L MD Insurance [facility] [facility]   11/14/2017 11/14/2017 oxycodone hcl 10 mg tablet  40 7 [name], [name] Insurance [facility]   11/01/2017 11/02/2017 oxycodone hcl 10 mg tablet  40 7 [name], Gwenith Spitziffany L MD Insurance [facility]   10/23/2017 10/25/2017 oxycodone hcl 10 mg tablet  40 7 [name], [name] Medicaid [facility]   10/10/2017 10/12/2017 oxycodone hcl 10 mg tablet  40 7 [name], [name] Medicaid [facility] [place] [facility]   09/30/2017 10/02/2017 oxycodone hcl 10 mg tablet  40 7 [name], Tiffany L  MD Medicaid [facility]   09/18/2017 09/18/2017 oxycodone hcl 10 mg tablet  40 7 [name], [name] Cash [facility]   09/10/2017 09/10/2017 oxycodone hcl 10 mg tablet  40 7 [name], Gwenith Spitziffany L MD Insurance [facility]   09/02/2017 09/04/2017 oxycodone hcl 10 mg tablet  40 7 [name], [name] Cash [facility]   08/26/2017 08/27/2017 oxycodone hcl 10 mg tablet  40 7 Karle BarrLie, Ariadne G MD Cash [facility]   08/09/2017 08/13/2017 oxycodone hcl 10 mg tablet  40 7 [name], Gwenith Spitziffany L MD Insurance [facility] [facility]   08/05/2017 08/06/2017 oxycodone hcl 10 mg tablet  40 7 [name], Gwenith Spitziffany L MD Insurance [facility]   07/02/2017 07/02/2017 oxycodone hcl 10 mg tablet  40 7 [name], Gwenith Spitziffany L MD Insurance [facility]   06/04/2017 06/04/2017 oxycodone hcl 10 mg tablet  40 7 [name], Gwenith Spitziffany L MD Insurance [facility]     Patient Name: Cameron [name] Birth Date: 10/11/1995   Address: [address] [place], [place] [id number] Sex: Male   Rx Written Rx Dispensed Drug Quantity Days Supply Prescriber Name Payment Method Dispenser   04/30/2017 04/30/2017 oxycodone hcl 10 mg tablet  40 7 [name], [name] [facility] [place] [facility] [place] [facility]

## 2018-04-29 NOTE — Telephone Encounter (Signed)
Spoke to TRW AutomotiveLucius' mom, [name] is at the hospital for another appointment and wants to know if his medication will be refilled today and if eh can pick them up soon.    Oxycodone 10mg , pended at todays appointment

## 2018-04-29 NOTE — Telephone Encounter (Signed)
Cameron BachelorLucius [name] - 16- 24 Prescriptions  Confidential Drug Report  Search Terms: Cameron BachelorLucius Dotter, 1995/12/24   Search Date: 04/29/2018 02:06:43 PM   Searching on behalf of: [id number] - Cameron [name]   The Drug Utilization Report below displays all of the controlled substance prescriptions, if any, that your patient has filled in the last twelve months. The information displayed on this report is compiled from pharmacy submissions to the Department, and accurately reflects the information as submitted by the pharmacies.  This report was requested by: Milana Obeyndrea Roy   Reference #: [id number]   Others' Prescriptions  Patient Name: Cameron [name] Birth Date: 1995/12/24   Address: [address] [place], [place] [id number] Sex: Male   Rx Written Rx Dispensed Drug Quantity Days Supply Prescriber Name Payment Method Dispenser   04/09/2018 04/09/2018 oxycodone hcl 10 mg tablet  84 14 ClovisReinhardt, Barkley BrunsKristine M Insurance [facility] [facility]   03/25/2018 03/25/2018 oxycodone hcl 10 mg tablet  80 14 Roselind Rilyeinhardt, Kristine M Insurance [facility]   03/12/2018 03/12/2018 oxycodone hcl 10 mg tablet  80 13 [name], Gwenith Spitziffany L MD Insurance [facility]   02/25/2018 02/25/2018 oxycodone hcl 10 mg tablet  80 13 [name], Gwenith Spitziffany L MD Insurance [facility]   02/13/2018 02/13/2018 oxycodone hcl 10 mg tablet  80 13 [name], Cameron L MD Insurance [facility] [facility]   01/21/2018 01/21/2018 oxycodone hcl 10 mg tablet  80 13 [name], Cameron L MD Insurance [facility]   01/08/2018 01/08/2018 oxycodone hcl 10 mg tablet  40 7 Lie, Ariadne Insurance [facility]   12/25/2017 12/25/2017 oxycodone hcl 10 mg tablet  40 7 [name], Gwenith Spitziffany L MD Insurance [facility]   12/04/2017 12/05/2017 oxycodone hcl 10 mg tablet  40 7 [name], Gwenith Spitziffany L MD Insurance [facility]   11/25/2017 11/26/2017 oxycodone hcl 10 mg tablet  40 6  [name], Gwenith Spitziffany L MD Insurance [facility]   11/14/2017 11/14/2017 oxycodone hcl 10 mg tablet  40 7 [name], Cameron L MD Insurance [facility] [facility]   11/01/2017 11/02/2017 oxycodone hcl 10 mg tablet  40 7 [name], Gwenith Spitziffany L MD Insurance [facility]   10/23/2017 10/25/2017 oxycodone hcl 10 mg tablet  40 7 [name], Cameron L MD Medicaid [facility] [facility]   10/10/2017 10/12/2017 oxycodone hcl 10 mg tablet  40 7 [name], Cameron L MD Medicaid [facility] [facility]   09/30/2017 10/02/2017 oxycodone hcl 10 mg tablet  40 7 [name], Cameron L MD Medicaid [facility] [facility]   09/18/2017 09/18/2017 oxycodone hcl 10 mg tablet  40 7 [name], Cameron L MD Cash [facility]   09/10/2017 09/10/2017 oxycodone hcl 10 mg tablet  40 7 [name], Gwenith Spitziffany L MD Insurance [facility]   09/02/2017 09/04/2017 oxycodone hcl 10 mg tablet  40 7 [name], Cameron L MD Cash [facility]   08/26/2017 08/27/2017 oxycodone hcl 10 mg tablet  40 7 Karle BarrLie, Ariadne G MD Cash [facility]   08/09/2017 08/13/2017 oxycodone hcl 10 mg tablet  40 7 [name], Gwenith Spitziffany L MD Insurance [facility] [facility]   08/05/2017 08/06/2017 oxycodone hcl 10 mg tablet  40 7 [name], Gwenith Spitziffany L MD Insurance [facility]   07/02/2017 07/02/2017 oxycodone hcl 10  mg tablet  40 7 [name], [name] MD Insurance [facility]   06/04/2017 06/04/2017 oxycodone hcl 10 mg tablet  40 7 [name], [name] MD Insurance [facility]     Patient Name: Cameron [name] Birth Date: December 06, 1995   Address: [address] [place], [place] [address] Sex: Male   Rx Written Rx Dispensed Drug Quantity Days Supply Prescriber Name Payment Method Dispenser   04/30/2017 04/30/2017 oxycodone hcl 10 mg tablet  40 7 [name], Cameron L MD Insurance [facility] [place] [facility] [place] [facility]   * - Drugs marked with  an asterisk are compound drugs. If the compound drug is made up of more than one controlled substance, then each controlled substance will be a separate row in the table.

## 2018-04-30 MED ORDER — OXYCODONE HCL 10 MG PO TABS *I*
ORAL_TABLET | ORAL | 0 refills | Status: DC
Start: 2018-04-30 — End: 2018-05-13

## 2018-04-30 MED ORDER — FOLIC ACID 1 MG PO TABS *I*
1.0000 mg | ORAL_TABLET | Freq: Every day | ORAL | 5 refills | Status: DC
Start: 2018-04-30 — End: 2018-06-26

## 2018-04-30 NOTE — Telephone Encounter (Signed)
Spoke to Clarksvilleracy. French Anaracy states the patient is out of medication. French Anaracy states she is upset, stating the patient was expecting to pick up medication after the appointment on 08/06.   [phone]

## 2018-04-30 NOTE — Telephone Encounter (Signed)
rx sent

## 2018-05-01 NOTE — Progress Notes (Signed)
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     GI History  Patient was admitted in May 2018 for loose stools mixed w/ blood and abdominal pain - at that time he was noted to have NSAID use, and underwent EGD. EGD findings were were normal. He subsequently underwent colonoscopy for further evaluation - this showed mild edema/inflammation from the sigmoid colon to the cecum. Rectum and terminal ileum appeared normal. Biopsies of the colon revealed mildly active chronic colitis in the right, transverse, and left colons, with moderately active chronic colitis in the rectum. CRP at that time was elevated to 13. CT abd/pelvis showed no evidence of active GI bleeding or findings to suggest IBD. After biopsy results, he was started on Asacol 4.8 g/day. Due to insurance he had prolonged periods of poor compliance.    Interval History  Patient mentions doing very well. Having 1-2 formed BMs daily without presence of blood. Denies abdominal pain. Taking mesalamine daily.    He continues to play basketball despite severe anemia. Working at [employer] on night shifts, denies tiredness or weakness.    Past medical history, past surgical history, social history, and family history were all reviewed and updated as needed.     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     Current Outpatient Prescriptions on File Prior to Encounter    Medication Sig Dispense Refill    cyclobenzaprine (FLEXERIL) 10 MG tablet Take 1 tablet (10 mg total) by mouth 3 times daily as needed for Muscle spasms 45 tablet 0    cholecalciferol (VITAMIN D) 1000 UNIT tablet Take 4 tablets (4,000 Units total) by mouth daily 120 tablet 5    naloxone (NARCAN) 4 mg/0.1 mL nasal spray Instill 1 spray in 1 nostril once for opioid reversal. Repeat in alternating nostrils with new package every 2-3 minutes until response 2 each 5     No current facility-administered medications on file prior to encounter.        Review of Systems  Constitutional: No unexplained weight loss, fevers, night sweats  Eyes: No vision changes, conjunctival injection, or diplopia   Ears, nose, and throat: No epistaxis, dysphagia, odynophagia, globus sensation  Cardiovascular: No chest pain, palpitations, dyspnea on exertion, orthopnea  Pulmonary: No cough, sputum production, shortness of breath  Gastrointestinal: See HPI  Genitourinary: No dysuria, polyuria, hematuria, or urinary incontinence.   Endocrine: No heat or cold intolerance, diaphoresis  Hematologic: No easy bruising or bleeding, anemia  Musculoskeletal: No joint pain, stiffness, erythema  Integumentary: No rashes, lesions, or nodules  Neurologic: No headaches, loss of extremity strength or sensation, confusion  Psychiatric: No anxiety, depression, or insomnia     Objective  BP 120/62    Pulse 107    Ht 185.4 cm (6' 1")    Wt 84.4 kg (186 lb 1.6 oz)  BMI 24.55 kg/m   General: NAD  Skin: Warm and dry. No rashes.  HEENT: Conjunctival pallor noted. Oropharynx clear, without ulceration, lesions  Neck: No masses, LAD or thyromegaly.     Heart: RRR. S1 and S2.   Lungs: Normal respiratory effort. CTAB.  Abdomen: Normoactive bowel sounds. Soft, nontender and nondistended. No guarding or rebound. No palpable masses.  Musculoskeletal: No red, swollen, or tender joints.  Vascular: Warm extremities.  Lymphatic: No cervical lymphadenopathy.  Neurologic:  A&O x3. No focal deficits.     Laboratory Data  Lab Results   Component Value Date    NA 137 10/09/2017    K 4.6 10/09/2017    CL 101 10/09/2017    CO2 26 10/09/2017    UN 6 10/09/2017    CREAT 0.73 10/09/2017     Lab Results   Component Value Date    ALK 208 (H) 10/09/2017    TB 0.7 10/09/2017    DB <0.2 02/07/2017    ALB 3.4 (L) 10/09/2017    ALT 42 10/09/2017    AST 24 10/09/2017     Lab Results   Component Value Date    WBC 4.9 03/12/2018    HGB 5.3 (L) 03/12/2018    HCT 20 (L) 03/12/2018    MCV 61 (L) 03/12/2018    PLT 456 (H) 03/12/2018     Lab Results   Component Value Date    LIP 15 02/07/2017    AMY 80 02/07/2017      Lab Results   Component Value Date    INR 1.5 (H) 09/08/2017       Endoscopy & Pathology    Colonoscopy (02/11/17):  Findings:  Anorectal:   Normal tone, no masses  Terminal Ileum:   Normal ileal mucosa  Cecum:   Mild edema, loss of vascular markings, and punctate areas of   white exudate.  Ascending Colon:   Mild edema, loss of vascular markings, and punctate areas of   white exudate.  Transverse Colon:   Mild edema and loss of vascular markings.  Descending Colon:   Mild edema and loss of vascular markings.  Sigmoid:   Mild edema and loss of vascular markings.  Rectum:   Normal rectum throughout    Small internal hemorrhoids    Impression(s):   Mild edema/inflammation from sigmoid to cecum. This is unlikely   the source of bleeding   Rectum and terminal ileum appeared normal.    FINAL DIAGNOSIS:   (A) Colon, right, biopsy:   - Chronic colitis, mildly active.   - No dysplasia is seen.     (B) Colon, transverse, biopsy:   - Chronic colitis, mildly active.   - No dysplasia is seen.     (C) Colon, left, biopsy:   - Chronic colitis, mildly active.   - No dysplasia is seen.     (B) Rectum, biopsy:   - Chronic colitis, moderately active.   - No dysplasia is seen.     EGD (02/08/17):  Findings:   Esophagus: Normal  Stomach: Normal  Duodenum/small bowel: Normal      Impression(s):  1. Normal examination   2. No ulcers, erosions or bleeding stigmata seen   3. 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 Occult blood in stool could mean mild disease or it could be from known hemorrhoids.  -Continue mesalamine (Asacol) 4.8g daily. He was cautioned to let us know immediately if he can't get the prescription and avoid delaying therapy.  -Patient has pancolitis and will need colonoscopy for dysplasia surveillance in 2026, with 1-2 years repeats.  -We need to schedule colonoscopy to ensure endoscopic remission, but it would be dangerous at current level of anemia, though patient is pauci-symptomatic. To be planned once Hematocrit is above 21.    2. IBD health maintenance:   # Immunization:   - Asked to update Hep B vaccination with PCP. Influenza, pneumonia, and hepatitis A up to date.  # Bone health:   - Low vitamin D. Explained importance to replete.  # Micronutrients:    - Low iron despite oral supplementation. Suspect patient will need IV repletion. Recommended discussion with PCP. Might need Hematology referral.  # Cancer screening/surveillance/prevention:   - As above.    I plan to see Cordaryl Decelles III back in 3 months for follow up in our clinic.     It was a pleasure seeing Payton Prinsen III in the [facility] Gastroenterology [facility]  Hepatology clinic. Please do not hesitate to contact me with any questions or concerns.    Lockie Mola MD   Fellow (PGY-5), Division of Gastroenterology and Hepatology   [facility] of [facility], [place], [place]  [place], [place] [address]

## 2018-05-13 ENCOUNTER — Other Ambulatory Visit: Payer: Self-pay | Admitting: Primary Care

## 2018-05-13 NOTE — Telephone Encounter (Signed)
Patient Name: Cameron [name] Birth Date: 04/07/1996   Address: [address] [place],  [address] Sex: Male   Rx Written Rx Dispensed Drug Quantity Days Supply Prescriber Name Payment Method Dispenser   04/30/2018 04/30/2018 oxycodone hcl 10 mg tablet  84 14 [name], [name] Insurance [facility] [name]   04/09/2018 04/09/2018 oxycodone hcl 10 mg tablet  84 14 Reinhardt, Kristine M Insurance [facility] [name]   03/25/2018 03/25/2018 oxycodone hcl 10 mg tablet  80 14 Reinhardt, Kristine M Insurance [facility] [name]   03/12/2018 03/12/2018 oxycodone hcl 10 mg tablet  80 13 [name], [name] Insurance [facility] [name]   02/25/2018 02/25/2018 oxycodone hcl 10 mg tablet  80 13 [name], [name] Insurance [facility] [name]   02/13/2018 02/13/2018 oxycodone hcl 10 mg tablet  80 13 [name], [name] Insurance [facility] [name]   01/21/2018 01/21/2018 oxycodone hcl 10 mg tablet  80 13 [name], [name] Insurance [facility] [name]   01/08/2018 01/08/2018 oxycodone hcl 10 mg tablet  40 7 Lie, Ariadne Insurance [facility] [name]   12/25/2017 12/25/2017 oxycodone hcl 10 mg tablet  40 7 [name], [name] Insurance [facility] [name]   12/04/2017 12/05/2017 oxycodone hcl 10 mg tablet  40 7 [name], [name] Insurance [facility] [name]   11/25/2017 11/26/2017 oxycodone hcl 10 mg tablet  40 6 [name], [name] Insurance [facility] [name]   11/14/2017 11/14/2017 oxycodone hcl 10 mg tablet  40 7 [name], [name] Insurance [facility] [name]   11/01/2017 11/02/2017 oxycodone hcl 10 mg tablet  40 7 [name], [name] Insurance [facility] [name]   10/23/2017 10/25/2017 oxycodone hcl 10 mg tablet  40 7 [name], [name] Medicaid [facility] [name]   10/10/2017 10/12/2017 oxycodone hcl 10 mg tablet  40 7 [name], Tiffany  L MD Medicaid [facility] [name]   09/30/2017 10/02/2017 oxycodone hcl 10 mg tablet  40 7 [name], [name] Medicaid [facility] [name]   09/18/2017 09/18/2017 oxycodone hcl 10 mg tablet  40 7 [name], [name] Cash [facility] [name]   09/10/2017 09/10/2017 oxycodone hcl 10 mg tablet  40 7 [name], [name] Insurance [facility] [name]   09/02/2017 09/04/2017 oxycodone hcl 10 mg tablet  40 7 [name], [name] Cash [facility] [name]   08/26/2017 08/27/2017 oxycodone hcl 10 mg tablet  40 7 Lie, Ariadne G MD Cash [facility] [name]   08/09/2017 08/13/2017 oxycodone hcl 10 mg tablet  40 7 [name], [name] Insurance [facility] [name]   08/05/2017 08/06/2017 oxycodone hcl 10 mg tablet  40 7 [name], [name] Insurance [facility] [name]   07/02/2017 07/02/2017 oxycodone hcl 10 mg tablet  40 7 [name], [name] Insurance [facility] [name]   06/04/2017 06/04/2017 oxycodone hcl 10 mg tablet  40 7 [name], [name] Insurance [facility] [name]

## 2018-05-14 ENCOUNTER — Ambulatory Visit: Payer: Medicaid Other | Attending: Primary Care | Admitting: Primary Care

## 2018-05-14 ENCOUNTER — Encounter: Payer: Self-pay | Admitting: Primary Care

## 2018-05-14 ENCOUNTER — Other Ambulatory Visit: Payer: Self-pay | Admitting: Primary Care

## 2018-05-14 VITALS — BP 126/64 | HR 88 | Ht 73.0 in | Wt 187.3 lb

## 2018-05-14 DIAGNOSIS — D571 Sickle-cell disease without crisis: Secondary | ICD-10-CM

## 2018-05-14 DIAGNOSIS — K5190 Ulcerative colitis, unspecified, without complications: Secondary | ICD-10-CM

## 2018-05-14 DIAGNOSIS — D564 Hereditary persistence of fetal hemoglobin [HPFH]: Secondary | ICD-10-CM

## 2018-05-14 DIAGNOSIS — Z0289 Encounter for other administrative examinations: Secondary | ICD-10-CM | POA: Insufficient documentation

## 2018-05-14 LAB — CBC AND DIFFERENTIAL
Baso # K/uL: 0 10*3/uL (ref 0.0–0.1)
Basophil %: 0.6 %
Eos # K/uL: 0.3 10*3/uL (ref 0.0–0.5)
Eosinophil %: 6.3 %
Hematocrit: 20 % — ABNORMAL LOW (ref 40–51)
Hemoglobin: 5.3 g/dL — ABNORMAL LOW (ref 13.7–17.5)
IMM Granulocytes #: 0 10*3/uL
IMM Granulocytes: 0.2 %
Lymph # K/uL: 1.4 10*3/uL (ref 1.3–3.6)
Lymphocyte %: 26.5 %
MCH: 16 pg/cell — ABNORMAL LOW (ref 26–32)
MCHC: 26 g/dL — ABNORMAL LOW (ref 32–37)
MCV: 63 fL — ABNORMAL LOW (ref 79–92)
Mono # K/uL: 0.5 10*3/uL (ref 0.3–0.8)
Monocyte %: 9 %
Neut # K/uL: 3.1 10*3/uL (ref 1.8–5.4)
Nucl RBC # K/uL: 0 10*3/uL (ref 0.0–0.0)
Nucl RBC %: 0 /100 WBC (ref 0.0–0.2)
Platelets: 275 10*3/uL (ref 150–330)
RBC: 3.2 MIL/uL — ABNORMAL LOW (ref 4.6–6.1)
RDW: 22.2 % — ABNORMAL HIGH (ref 11.6–14.4)
Seg Neut %: 57.4 %
WBC: 5.4 10*3/uL (ref 4.2–9.1)

## 2018-05-14 LAB — RBC MORPHOLOGY

## 2018-05-14 LAB — FERRITIN: Ferritin: 12 ng/mL — ABNORMAL LOW (ref 20–250)

## 2018-05-14 LAB — TIBC
Iron: 19 ug/dL — ABNORMAL LOW (ref 45–170)
TIBC: 370 ug/dL (ref 250–450)
Transferrin Saturation: 5 % — ABNORMAL LOW (ref 20–55)

## 2018-05-14 MED ORDER — OXYCODONE HCL 10 MG PO TABS *I*
ORAL_TABLET | ORAL | 0 refills | Status: DC
Start: 2018-05-14 — End: 2018-05-27

## 2018-05-14 NOTE — Progress Notes (Signed)
Cameron [name] - 16- 24 Prescriptions  Confidential Drug Report  Search Terms: Cameron [name], 1995/12/04   Search Date: 05/14/2018 09:46:59 AM   Searching on behalf of: [id number] - Tiffany L [name]   The Drug Utilization Report below displays all of the controlled substance prescriptions, if any, that your patient has filled in the last twelve months. The information displayed on this report is compiled from pharmacy submissions to the Department, and accurately reflects the information as submitted by the pharmacies.  This report was requested by: Laruth BouchardJennifer Keigen Caddell   Reference #: [id number]   Others' Prescriptions  Patient Name: Cameron [name] Birth Date: 1995/12/04   Address: [address] [place], [place] [id number] Sex: Male   Rx Written Rx Dispensed Drug Quantity Days Supply Prescriber Name Payment Method Dispenser   04/30/2018 04/30/2018 oxycodone hcl 10 mg tablet  84 14 [name], Gwenith Spitziffany L MD [facility]   04/09/2018 04/09/2018 oxycodone hcl 10 mg tablet  84 14 Roselind Rilyeinhardt, Kristine M [facility]   03/25/2018 03/25/2018 oxycodone hcl 10 mg tablet  80 14 Roselind Rilyeinhardt, Kristine M [facility]   03/12/2018 03/12/2018 oxycodone hcl 10 mg tablet  80 13 [name], Gwenith Spitziffany L MD [facility]   02/25/2018 02/25/2018 oxycodone hcl 10 mg tablet  80 13 [name], Gwenith Spitziffany L MD [facility]   02/13/2018 02/13/2018 oxycodone hcl 10 mg tablet  80 13 [name], [name] [facility]   01/21/2018 01/21/2018 oxycodone hcl 10 mg tablet  80 13 [name], [name] [facility]   01/08/2018 01/08/2018 oxycodone hcl 10 mg tablet  40 7 Lie, Ariadne Insurance [facility]   12/25/2017 12/25/2017 oxycodone hcl 10 mg tablet  40 7 [name], Gwenith Spitziffany L MD [facility]   12/04/2017 12/05/2017 oxycodone hcl 10 mg tablet   40 7 [name], Gwenith Spitziffany L MD [facility]   11/25/2017 11/26/2017 oxycodone hcl 10 mg tablet  40 6 [name], Gwenith Spitziffany L MD [facility]   11/14/2017 11/14/2017 oxycodone hcl 10 mg tablet  40 7 [name], [name] [facility]   11/01/2017 11/02/2017 oxycodone hcl 10 mg tablet  40 7 [name], Gwenith Spitziffany L MD [facility]   10/23/2017 10/25/2017 oxycodone hcl 10 mg tablet  40 7 [name], [name] Medicaid [facility]   10/10/2017 10/12/2017 oxycodone hcl 10 mg tablet  40 7 [name], [name] Medicaid [facility]   09/30/2017 10/02/2017 oxycodone hcl 10 mg tablet  40 7 [name], [name] Medicaid [facility]   09/18/2017 09/18/2017 oxycodone hcl 10 mg tablet  40 7 [name], [name] Cash [facility]   09/10/2017 09/10/2017 oxycodone hcl 10 mg tablet  40 7 [name], Gwenith Spitziffany L MD Insurance [facility]   09/02/2017 09/04/2017 oxycodone hcl 10 mg tablet  40 7 [name], [name] [facility]   08/26/2017 08/27/2017 oxycodone hcl 10 mg tablet  40 7 Karle BarrLie, Ariadne G MD Cash [facility]   08/09/2017 08/13/2017 oxycodone hcl 10 mg tablet  40 7 [name], Gwenith Spitziffany L MD Insurance [facility]   08/05/2017 08/06/2017 oxycodone hcl 10  mg tablet  40 7 [name], Gwenith Spitziffany L MD [facility]   07/02/2017 07/02/2017 oxycodone hcl 10 mg tablet  40 7 [name], Gwenith Spitziffany L MD Insurance [facility]   06/04/2017 06/04/2017 oxycodone hcl 10 mg tablet  40 7 [name], [name] [facility]   * - Drugs marked with an asterisk are compound drugs. If the compound drug is made up of more than one controlled substance, then each controlled substance will be a separate row in the table.      Report Suspicious  Activity

## 2018-05-14 NOTE — Telephone Encounter (Signed)
Please notify patient that refill completed to listed pharmacy

## 2018-05-14 NOTE — Telephone Encounter (Signed)
Patient Name: Cameron [name] Birth Date: Sep 16, 1996   Address: [address] [place], [place] [id number] Sex: Male   Rx Written Rx Dispensed Drug Quantity Days Supply Prescriber Name Payment Method Dispenser   04/30/2018 04/30/2018 oxycodone hcl 10 mg tablet  84 14 [name], Gwenith Spitziffany L MD [facility]   04/09/2018 04/09/2018 oxycodone hcl 10 mg tablet  84 14 Roselind Rilyeinhardt, Kristine M [facility]   03/25/2018 03/25/2018 oxycodone hcl 10 mg tablet  80 14 Roselind Rilyeinhardt, Kristine M [facility]   03/12/2018 03/12/2018 oxycodone hcl 10 mg tablet  80 13 [name], Gwenith Spitziffany L MD Insurance [facility]   02/25/2018 02/25/2018 oxycodone hcl 10 mg tablet  80 13 [name], Gwenith Spitziffany L MD Insurance [facility]   02/13/2018 02/13/2018 oxycodone hcl 10 mg tablet  80 13 [name], [name] [facility]   01/21/2018 01/21/2018 oxycodone hcl 10 mg tablet  80 13 [name], [name] Insurance [facility]   01/08/2018 01/08/2018 oxycodone hcl 10 mg tablet  40 7 Lie, Ariadne [facility]   12/25/2017 12/25/2017 oxycodone hcl 10 mg tablet  40 7 [name], Gwenith Spitziffany L MD Insurance [facility]   12/04/2017 12/05/2017 oxycodone hcl 10 mg tablet  40 7 [name], Gwenith Spitziffany L MD Insurance [facility]   11/25/2017 11/26/2017 oxycodone hcl 10 mg tablet  40 6 [name], Gwenith Spitziffany L MD Insurance [facility]   11/14/2017 11/14/2017 oxycodone hcl 10 mg tablet  40 7 [name], [name] [facility]   11/01/2017 11/02/2017 oxycodone hcl 10 mg tablet  40 7 [name], Gwenith Spitziffany L MD [facility]   10/23/2017 10/25/2017 oxycodone hcl 10 mg tablet  40 7 [name], [name] Medicaid [facility]   10/10/2017 10/12/2017 oxycodone hcl 10 mg tablet  40 7 [name], Tiffany  L MD Medicaid [facility]   09/30/2017 10/02/2017 oxycodone hcl 10 mg tablet  40 7 [name], [name] Medicaid [facility]   09/18/2017 09/18/2017 oxycodone hcl 10 mg tablet  40 7 [name], [name] Cash [facility]   09/10/2017 09/10/2017 oxycodone hcl 10 mg tablet  40 7 [name], Gwenith Spitziffany L MD Insurance [facility]   09/02/2017 09/04/2017 oxycodone hcl 10 mg tablet  40 7 [name], [name] [facility]   08/26/2017 08/27/2017 oxycodone hcl 10 mg tablet  40 7 Karle BarrLie, Ariadne G MD Cash [facility]   08/09/2017 08/13/2017 oxycodone hcl 10 mg tablet  40 7 [name], Gwenith Spitziffany L MD Insurance [facility]   08/05/2017 08/06/2017 oxycodone hcl 10 mg tablet  40 7 [name], Gwenith Spitziffany L MD Insurance [facility]   07/02/2017 07/02/2017 oxycodone hcl 10 mg tablet  40 7 [name], Gwenith Spitziffany L MD Insurance [facility]   06/04/2017 06/04/2017 oxycodone hcl 10 mg tablet  40 7 [name], [name] [facility] [place] [place]

## 2018-05-16 LAB — VITAMIN D
25-OH VIT D2: <4 ng/mL
25-OH VIT D3: 10 ng/mL
25-OH Vit Total: 10 ng/mL — ABNORMAL LOW (ref 30–60)

## 2018-05-27 ENCOUNTER — Other Ambulatory Visit: Payer: Self-pay | Admitting: Primary Care

## 2018-05-27 NOTE — Telephone Encounter (Signed)
Confidential Drug Report  Search Terms: Shari Zeedyk, 10/30/1995   Search Date: 05/27/2018 12:23:32 PM   Searching on behalf of: [id number] - Tiffany L [name]   The Drug Utilization Report below displays all of the controlled substance prescriptions, if any, that your patient has filled in the last twelve months. The information displayed on this report is compiled from pharmacy submissions to the Department, and accurately reflects the information as submitted by the pharmacies.  This report was requested by: Denton Meek   Reference #: [id number]   Others' Prescriptions  Patient Name: Cameron [name] Birth Date: 04-06-1996   Address: [address] [place], [place] [address] Sex: Male   Rx Written Rx Dispensed Drug Quantity Days Supply Prescriber Name Payment Method Dispenser   05/14/2018 05/14/2018 oxycodone hcl 10 mg tablet  84 14 [name], [name] Insurance [facility]   04/30/2018 04/30/2018 oxycodone hcl 10 mg tablet  84 14 [name], [name] Insurance [facility]   04/09/2018 04/09/2018 oxycodone hcl 10 mg tablet  84 14 [name] Insurance [facility]   03/25/2018 03/25/2018 oxycodone hcl 10 mg tablet  80 14 [name] Insurance [facility]   03/12/2018 03/12/2018 oxycodone hcl 10 mg tablet  80 13 [name], [name] MD Insurance [facility]   02/25/2018 02/25/2018 oxycodone hcl 10 mg tablet  80 13 [name], [name] Insurance [facility]   02/13/2018 02/13/2018 oxycodone hcl 10 mg tablet  80 13 [name], [name] Insurance [facility]   01/21/2018 01/21/2018 oxycodone hcl 10 mg tablet  80 13 [name], [name] Insurance [facility]   01/08/2018 01/08/2018 oxycodone hcl 10 mg tablet  40 7 Lie, Ariadne Insurance [facility]   12/25/2017 12/25/2017 oxycodone hcl 10 mg tablet  40 7 [name], [name] MD Insurance  [facility]   12/04/2017 12/05/2017 oxycodone hcl 10 mg tablet  40 7 [name], [name] MD Insurance [facility]   11/25/2017 11/26/2017 oxycodone hcl 10 mg tablet  40 6 [name], [name] MD Insurance [facility]   11/14/2017 11/14/2017 oxycodone hcl 10 mg tablet  40 7 [name], [name] Insurance [facility]   11/01/2017 11/02/2017 oxycodone hcl 10 mg tablet  40 7 [name], [name] MD Insurance [facility]   10/23/2017 10/25/2017 oxycodone hcl 10 mg tablet  40 7 [name], [name] Medicaid [facility]   10/10/2017 10/12/2017 oxycodone hcl 10 mg tablet  40 7 [name], [name] Medicaid [facility]   09/30/2017 10/02/2017 oxycodone hcl 10 mg tablet  40 7 [name], [name] Medicaid [facility]   09/18/2017 09/18/2017 oxycodone hcl 10 mg tablet  40 7 [name], [name] Cash [facility]   09/10/2017 09/10/2017 oxycodone hcl 10 mg tablet  40 7 [name], [name] MD Insurance [facility]   09/02/2017 09/04/2017 oxycodone hcl 10 mg tablet  40 7 [name], [name] Cash [facility]   08/26/2017 08/27/2017 oxycodone hcl 10 mg tablet  40 7 Karle Barr MD Cash [facility]   08/09/2017 08/13/2017 oxycodone hcl 10 mg tablet  40 7 [name],  [name] MD Insurance [facility]   08/05/2017 08/06/2017 oxycodone hcl 10 mg tablet  40 7 [name], [name] MD Insurance [facility]   07/02/2017 07/02/2017 oxycodone hcl 10 mg tablet  40 7 [name], [name] MD Insurance [facility]   06/04/2017 06/04/2017 oxycodone hcl 10 mg tablet  40 7 [name], [name] Insurance [facility] [place] [facility] [place] [facility]

## 2018-05-29 ENCOUNTER — Other Ambulatory Visit: Payer: Self-pay | Admitting: Primary Care

## 2018-05-29 NOTE — Telephone Encounter (Signed)
Mom is calling stating that the patient needs this medication asap

## 2018-05-30 ENCOUNTER — Encounter: Payer: Self-pay | Admitting: Primary Care

## 2018-05-30 DIAGNOSIS — Z5321 Procedure and treatment not carried out due to patient leaving prior to being seen by health care provider: Secondary | ICD-10-CM

## 2018-05-30 MED ORDER — OXYCODONE HCL 10 MG PO TABS *I*
ORAL_TABLET | ORAL | 0 refills | Status: DC
Start: 2018-05-30 — End: 2018-06-11

## 2018-05-30 NOTE — Telephone Encounter (Signed)
Confidential Drug Report  Search Terms: Lawrene Bowcutt, Apr 16, 1996   Search Date: 05/30/2018 10:38:53 AM   Searching on behalf of: [id number] - [name]   The Drug Utilization Report below displays all of the controlled substance prescriptions, if any, that your patient has filled in the last twelve months. The information displayed on this report is compiled from pharmacy submissions to the Department, and accurately reflects the information as submitted by the pharmacies.  This report was requested by: Denton Meek   Reference #: [id number]   Others' Prescriptions  Patient Name: Cameron [name] Birth Date: 1996/08/06   Address: [address] [place], [place] [address] Sex: Male   Rx Written Rx Dispensed Drug Quantity Days Supply Prescriber Name Payment Method Dispenser   05/14/2018 05/14/2018 oxycodone hcl 10 mg tablet  84 14 [name], [name] Insurance [facility]   04/30/2018 04/30/2018 oxycodone hcl 10 mg tablet  84 14 [name], [name] Insurance [facility]   04/09/2018 04/09/2018 oxycodone hcl 10 mg tablet  84 14 [name] Insurance [facility]   03/25/2018 03/25/2018 oxycodone hcl 10 mg tablet  80 14 [name] Insurance [facility]   03/12/2018 03/12/2018 oxycodone hcl 10 mg tablet  80 13 [name], [name] MD Insurance [facility]   02/25/2018 02/25/2018 oxycodone hcl 10 mg tablet  80 13 [name], [name] Insurance [facility]   02/13/2018 02/13/2018 oxycodone hcl 10 mg tablet  80 13 [name], [name] Insurance [facility]   01/21/2018 01/21/2018 oxycodone hcl 10 mg tablet  80 13 [name], [name] Insurance [facility]   01/08/2018 01/08/2018 oxycodone hcl 10 mg tablet  40 7 Lie, Ariadne Insurance [facility]   12/25/2017 12/25/2017 oxycodone hcl 10 mg tablet  40 7 [name], [name] MD  Insurance [facility]   12/04/2017 12/05/2017 oxycodone hcl 10 mg tablet  40 7 [name], [name] MD Insurance [facility]   11/25/2017 11/26/2017 oxycodone hcl 10 mg tablet  40 6 [name], [name] MD Insurance [facility]   11/14/2017 11/14/2017 oxycodone hcl 10 mg tablet  40 7 [name], [name] Insurance [facility]   11/01/2017 11/02/2017 oxycodone hcl 10 mg tablet  40 7 [name], [name] MD Insurance [facility]   10/23/2017 10/25/2017 oxycodone hcl 10 mg tablet  40 7 [name], [name] Medicaid [facility]   10/10/2017 10/12/2017 oxycodone hcl 10 mg tablet  40 7 [name], [name] Medicaid [facility]   09/30/2017 10/02/2017 oxycodone hcl 10 mg tablet  40 7 [name], [name] Medicaid [facility]   09/18/2017 09/18/2017 oxycodone hcl 10 mg tablet  40 7 [name], [name] Cash [facility]   09/10/2017 09/10/2017 oxycodone hcl 10 mg tablet  40 7 [name], [name] MD Insurance [facility]   09/02/2017 09/04/2017 oxycodone hcl 10 mg tablet  40 7 [name], [name] Cash [facility]   08/26/2017 08/27/2017 oxycodone hcl 10 mg tablet  40 7 Karle Barr MD Cash [facility]   08/09/2017 08/13/2017 oxycodone hcl 10 mg tablet  40 7 [name],  [name] MD Insurance [facility]   08/05/2017 08/06/2017 oxycodone hcl 10 mg tablet  40 7 [name], [name] MD Insurance [facility]   07/02/2017 07/02/2017 oxycodone hcl 10 mg tablet  40 7 [name], [name] MD Insurance [facility]   06/04/2017 06/04/2017 oxycodone hcl 10 mg tablet  40 7 [name], [name] Insurance [facility] [place] [facility] [place] [facility]

## 2018-06-11 ENCOUNTER — Other Ambulatory Visit: Payer: Self-pay | Admitting: Internal Medicine

## 2018-06-11 MED ORDER — OXYCODONE HCL 10 MG PO TABS *I*
ORAL_TABLET | ORAL | 0 refills | Status: DC
Start: 2018-06-11 — End: 2018-06-26

## 2018-06-11 NOTE — Telephone Encounter (Signed)
Cameron [name] - 16 Prescriptions  Confidential Drug Report  Search Terms: Cameron [name], Aug 21, 1996   Search Date: 06/11/2018 11:06:12 AM   Searching on behalf of: [id number] - Cameron [name]   The Drug Utilization Report below displays all of the controlled substance prescriptions, if any, that your patient has filled in the last twelve months. The information displayed on this report is compiled from pharmacy submissions to the Department, and accurately reflects the information as submitted by the pharmacies.  This report was requested by: Blossom Hoops   Reference #: [id number]   Others' Prescriptions  Patient Name: Cameron [name] Birth Date: May 26, 1996   Address: [address] [place], [place] [address] Sex: Male   Rx Written Rx Dispensed Drug Quantity Days Supply Prescriber Name Payment Method Dispenser   05/30/2018 05/30/2018 oxycodone hcl 10 mg tablet  84 14 S[place]lin, Barkley Bruns M Insurance [facility]   05/14/2018 05/14/2018 oxycodone hcl 10 mg tablet  84 14 [name], [name] MD Insurance [facility]   04/30/2018 04/30/2018 oxycodone hcl 10 mg tablet  84 14 [name], [name] Insurance [facility]   04/09/2018 04/09/2018 oxycodone hcl 10 mg tablet  84 14 Cameron [name] M Insurance [facility]   03/25/2018 03/25/2018 oxycodone hcl 10 mg tablet  80 14 Cameron [name] M Insurance [facility]   03/12/2018 03/12/2018 oxycodone hcl 10 mg tablet  80 13 [name], [name] MD Insurance [facility]   02/25/2018 02/25/2018 oxycodone hcl 10 mg tablet  80 13 [name], [name] Insurance [facility]   02/13/2018 02/13/2018 oxycodone hcl 10 mg tablet  80 13 [name], [name] Insurance [facility]   01/21/2018 01/21/2018 oxycodone hcl 10 mg tablet  80 13 [name], [name] Insurance [facility]   01/08/2018 01/08/2018 oxycodone hcl 10  mg tablet  40 7 [name], Cameron Insurance [facility]   12/25/2017 12/25/2017 oxycodone hcl 10 mg tablet  40 7 [name], [name] MD Insurance [facility]   12/04/2017 12/05/2017 oxycodone hcl 10 mg tablet  40 7 [name], [name] MD Insurance [facility]   11/25/2017 11/26/2017 oxycodone hcl 10 mg tablet  40 6 [name], [name] MD Insurance [facility]   11/14/2017 11/14/2017 oxycodone hcl 10 mg tablet  40 7 [name], [name] Insurance [facility]   11/01/2017 11/02/2017 oxycodone hcl 10 mg tablet  40 7 [name], [name] MD Insurance [facility]   10/23/2017 10/25/2017 oxycodone hcl 10 mg tablet  40 7 [name], [name] Medicaid [facility]   10/10/2017 10/12/2017 oxycodone hcl 10 mg tablet  40 7 [name], [name] Medicaid [facility]   09/30/2017 10/02/2017 oxycodone hcl 10 mg tablet  40 7 [name], [name] Medicaid [facility]   09/18/2017 09/18/2017 oxycodone hcl 10 mg tablet  40 7 [name], [name] Cash [facility]   09/10/2017 09/10/2017 oxycodone hcl 10 mg tablet  40 7 [name], [name] MD Insurance [facility]   09/02/2017 09/04/2017 oxycodone hcl 10 mg tablet  40 7 [name], [name] Cash [facility]   08/26/2017 08/27/2017 oxycodone hcl 10 mg  tablet  40 7 Karle BarrLie, Cameron G MD Cash [facility]   08/09/2017 08/13/2017 oxycodone hcl 10 mg tablet  40 7 [name], Gwenith Spitziffany L MD Insurance [facility]   08/05/2017 08/06/2017 oxycodone hcl 10 mg tablet  40 7 [name], Gwenith Spitziffany L MD Insurance [facility]   07/02/2017 07/02/2017 oxycodone hcl 10 mg tablet  40 7 [name], [name] [facility] [place] [facility] [place] [facility]

## 2018-06-12 ENCOUNTER — Other Ambulatory Visit: Payer: Self-pay | Admitting: Primary Care

## 2018-06-13 ENCOUNTER — Telehealth: Payer: Self-pay

## 2018-06-13 NOTE — Telephone Encounter (Signed)
@  [email or web address]  @[email or web address] @    Reminder Phone Call    Patient ID: Cameron [name] is a 22 y.o. male.  Appointments: Future Appointments  Date Time Provider Department Center   06/17/2018 9:00 AM [name], Diona Browneriffany Lynn, MD [facility] None   08/05/2018 1:30 PM Lavenia AtlasLee, Edwin Jin Su, MD [facility] None       Was unable to reach the patient.  Left a message on voice mail letting patient know of the time, date and address of the appointment and gave our phone number incase of any issues or problems.

## 2018-06-13 NOTE — Telephone Encounter (Signed)
This encounter was opened up in error.

## 2018-06-17 ENCOUNTER — Ambulatory Visit: Payer: Medicaid Other | Admitting: Primary Care

## 2018-06-20 ENCOUNTER — Ambulatory Visit: Payer: Medicaid Other | Attending: Primary Care | Admitting: Primary Care

## 2018-06-20 ENCOUNTER — Encounter: Payer: Self-pay | Admitting: Primary Care

## 2018-06-20 VITALS — BP 134/68 | HR 106 | Ht 73.0 in | Wt 187.8 lb

## 2018-06-20 DIAGNOSIS — D564 Hereditary persistence of fetal hemoglobin [HPFH]: Secondary | ICD-10-CM

## 2018-06-20 DIAGNOSIS — R7989 Other specified abnormal findings of blood chemistry: Secondary | ICD-10-CM

## 2018-06-20 DIAGNOSIS — E611 Iron deficiency: Secondary | ICD-10-CM

## 2018-06-20 DIAGNOSIS — D571 Sickle-cell disease without crisis: Secondary | ICD-10-CM

## 2018-06-20 DIAGNOSIS — Z23 Encounter for immunization: Secondary | ICD-10-CM

## 2018-06-20 DIAGNOSIS — K5190 Ulcerative colitis, unspecified, without complications: Secondary | ICD-10-CM

## 2018-06-20 MED ORDER — CHOLECALCIFEROL 1000 UNIT PO CAPS *WRAPPED*
2000.0000 [IU] | ORAL_CAPSULE | Freq: Every day | ORAL | 5 refills | Status: AC
Start: 2018-06-20 — End: 2018-12-17

## 2018-06-20 NOTE — Progress Notes (Addendum)
[facility]  [address]  [place] [address]  Phone: [phone]  Fax: [phone]    REASON FOR VISIT     Sickle Cell     PRIMARY DIAGNOSIS   Sickle Cell disease, type  HgSS    SUBJECTIVE     HbSS w high HbF    Has been doing very well. Has been having no pain.    Hasn't required any pain medications today.    Breathing is comfortable, no chest pain.    No fevers or chills   Has been taking folic acid regularly    Ulcerative colitis     Stools have been "OK"   No bloody stools   No abdominal pain.   Doesn't notice any exacerbating foods.    Taking mesalamine regularly, though has trouble with adhering to it with three tabs BID. Usually misses one evening or morning dose.    Doesn't really notice it helping his pain or not.     Chroinc anemia, iron deficiency    Bleeding has slown down, as above.    Has been taking iron maybe every other day, notes he forgets a lot.    Hasn't had any ill side effects.     Has thought about iron infusions, thinks that it may be a good idea for him.    Doesn't feel fatigued usually.     Current Pain score:   Pain    06/20/18 1118   PainSc:   0 - No pain     Current disease modifying therapy: not indicated    Medications Reviewed and changes     Current Outpatient Prescriptions:     folic acid (FOLVITE) 1 MG tablet, Take 1 tablet (1 mg total) by mouth daily, Disp: 30 tablet, Rfl: 5    mesalamine (ASACOL HD) 800 MG tablet, Take 3 tablets (2,400 mg total) by mouth 2 times daily, Disp: 180 tablet, Rfl: 2    ferrous sulfate 325 (65 FE) MG tablet, Take 1 tablet (325 mg total) by mouth 2 times daily (with meals), Disp: 100 tablet, Rfl: 3    oxyCODONE (ROXICODONE) 10 MG immediate release tablet, Take 1 tablet (10 mg total) by mouth every 4 hours as needed for Pain.  Max daily dose: 6 tablets, Disp: 84 tablet, Rfl: 0    acetaminophen (TYLENOL) 500 mg tablet, Take 500 mg by mouth every 6 hours as needed, Disp: , Rfl:     cyclobenzaprine  (FLEXERIL) 10 MG tablet, Take 1 tablet (10 mg total) by mouth 3 times daily as needed for Muscle spasms, Disp: 45 tablet, Rfl: 0    naloxone (NARCAN) 4 mg/0.1 mL nasal spray, Instill 1 spray in 1 nostril once for opioid reversal. Repeat in alternating nostrils with new package every 2-3 minutes until response, Disp: 2 each, Rfl: 5  Review of Systems   Constitutional: Negative for fever.   Respiratory: Negative for shortness of breath.    Cardiovascular: Negative for chest pain.   Gastrointestinal: Negative for abdominal pain and blood in stool.   Musculoskeletal: Negative for arthralgias.     OBJECTIVE     BP 134/68    Pulse 106    Ht 1.854 m (6\' 1" )    Wt 85.2 kg (187 lb 12.8 oz)    SpO2 100%    BMI 24.78 kg/m   Physical Exam  Gen: Sitting comfortably, in no distress.  Eyes: clear conjunctivae, anicteric sclerae   ENT: [facility]MM  CV: RRR, normal  S1S2, no m/g/r. No LEE  Resp: Unlabored respirations, lungs CTAB, no w/r/c   GI: Round, soft, NT, ND, normoactive BS.    Skin: Warm, dry, no rash.   Psych: Normal affect, euthymic.        Lab results: 05/14/18  1723   WBC 5.4   Hemoglobin 5.3*   Hematocrit 20*   RBC 3.2*   Platelets 275     ASSESSMENT / DIAGNOSIS     HbSS w/ HPFH    Generally doing quite well in this regard.   No pain currently. Continue as-needed oxycodone for which he has a current supply.    Continue folic acid   No indication for hydroxyurea      UC    Generally his disease control appears to have improved with cessation of abdominal discomfort and hematochezia.   Encouraged regular adherence to his mesalamine therapy, did acknowledge how tough it is for him to    Still with significant IDA, as below, likely related to poor absorption.    IDA    Chael is agreeable to IV iron infusions to manage his iron deficiency.   Continue oral iron for now.    Will discuss with PCP to coordinate his outpatient infusion.     Flu shot given today.    Low vitamin D noted today. Given that Raistlin is struggling to  take so many medications, discussed that we can start at a low dose of cholecalciferol (2000mg ) and see how he does. Given his poor absorption, he may need more aggressive repletion.     Pain management treatment plan   Patient has co diagnosis of ulcerative colitis dx age 23.  In addition Blood type O+ with antibodies anti-fya,jkb,lea  Outpatient Pain Regimen:  Long acting: none  Short acting; Oxy IR 10mg  Q4 hrs prn pain  Home Based titration regimen: none, needs assessment due to uncontrolled UC0  Adjunct therapies: none, holding NSAID due to ongoing LGI bleeding  Other: none    Preferred [facility]: [facility]    ED/Infusion Center care plan recommendations:  Toradol 30 mg IV Q6 hrs ATC  NS 2 L IV bolus  Transfuse for HcT <18  Hydromorphone 2 mg IV Q30 min prn pain score>5  Recommend hospitalization is pain score remains >5 after 3 doses of Hydromorphone 2 mg IV    Labs on arrival: CBC, retic, CHEM14  CXR if complaints of chest pain to evaluate for infiltrate consistent with acute chest syndrome  Assess for need for bronchodilator if complaints of chest pain    Inpatient care plan recommendations:  Toradol 30 mg IV Q6 hrs ATC  NS bolus until euvolemic followed by D51/2NS at 125 cc/hr until tolerating PO well  PCA: Start with demand only Hydromorophone PCA 1 mg Q20 min with lockout 12 mg over 4 hrs Recommend titration of pain regimen until achieving pain score <5     Prior to discharge recommend 24 hours of PO pain medications to assess for sx of withdrawal with transition and continued pain control to prevent readmission    Transfuse if HcT< 18 and consider GI consult as patient's most recent cause for profound anemia is due to UC      Return in about 4 weeks (around 07/18/2018) for fu w pulcino.  --Patient instructed to call if symptoms are not improving or worsening    Electronically signed by [name], MD 06/20/2018   11:33 AM  [facility], Phone: [phone]

## 2018-06-23 ENCOUNTER — Encounter: Payer: Self-pay | Admitting: Primary Care

## 2018-06-26 ENCOUNTER — Other Ambulatory Visit: Payer: Self-pay | Admitting: Primary Care

## 2018-06-27 MED ORDER — MESALAMINE 800 MG PO TBEC *I*
2400.0000 mg | DELAYED_RELEASE_TABLET | Freq: Two times a day (BID) | ORAL | 2 refills | Status: DC
Start: 2018-06-27 — End: 2018-07-22

## 2018-06-27 MED ORDER — OXYCODONE HCL 10 MG PO TABS *I*
ORAL_TABLET | ORAL | 0 refills | Status: DC
Start: 2018-06-27 — End: 2018-07-08

## 2018-06-27 MED ORDER — FOLIC ACID 1 MG PO TABS *I*
1.0000 mg | ORAL_TABLET | Freq: Every day | ORAL | 5 refills | Status: DC
Start: 2018-06-27 — End: 2018-07-22

## 2018-06-27 NOTE — Telephone Encounter (Signed)
Patient Name: Cameron [name] Birth Date: [birth year]   Address: [address] [place], [place] [address] Sex: Male   Rx Written Rx Dispensed Drug Quantity Days Supply Prescriber Name Payment Method Dispenser   06/11/2018 06/13/2018 oxycodone hcl 10 mg tablet  84 14 [name], [name] Insurance [facility]   05/30/2018 05/30/2018 oxycodone hcl 10 mg tablet  84 14 [name] [facility]   05/14/2018 05/14/2018 oxycodone hcl 10 mg tablet  84 14 [name], [name] Insurance [facility]   04/30/2018 04/30/2018 oxycodone hcl 10 mg tablet  84 14 [name], [name] [facility]   04/09/2018 04/09/2018 oxycodone hcl 10 mg tablet  84 14 [name] [facility]   03/25/2018 03/25/2018 oxycodone hcl 10 mg tablet  80 14 [name] [facility]   03/12/2018 03/12/2018 oxycodone hcl 10 mg tablet  80 13 [name], [name] [facility]   02/25/2018 02/25/2018 oxycodone hcl 10 mg tablet  80 13 [name], [name] [facility]   02/13/2018 02/13/2018 oxycodone hcl 10 mg tablet  80 13 [name], [name] [facility]   01/21/2018 01/21/2018 oxycodone hcl 10 mg tablet  80 13 [name], [name] [facility]   01/08/2018 01/08/2018 oxycodone hcl 10 mg tablet  40 7 Lie, Ariadne [facility]   12/25/2017 12/25/2017 oxycodone hcl 10 mg tablet  40 7 [name], [name] MD [facility]   12/04/2017 12/05/2017 oxycodone hcl 10 mg tablet  40 7 [name], [name] MD [facility]   11/25/2017 11/26/2017 oxycodone hcl 10 mg tablet  40 6 [name], [name] MD [facility]   11/14/2017 11/14/2017 oxycodone hcl 10 mg tablet  40 7 [name],  [name] [facility]   11/01/2017 11/02/2017 oxycodone hcl 10 mg tablet  40 7 [name], [name] MD [facility]   10/23/2017 10/25/2017 oxycodone hcl 10 mg tablet  40 7 [name], [name] Medicaid [facility]   10/10/2017 10/12/2017 oxycodone hcl 10 mg tablet  40 7 [name], [name] Medicaid [facility]   09/30/2017 10/02/2017 oxycodone hcl 10 mg tablet  40 7 [name], [name] Medicaid [facility]   09/18/2017 09/18/2017 oxycodone hcl 10 mg tablet  40 7 [name], [name] [facility]   09/10/2017 09/10/2017 oxycodone hcl 10 mg tablet  40 7 [name], [name] MD [facility]   09/02/2017 09/04/2017 oxycodone hcl 10 mg tablet  40 7 [name], [name] [facility]   08/26/2017 08/27/2017 oxycodone hcl 10 mg tablet  40 7 Karle Barr MD [facility]   08/09/2017 08/13/2017 oxycodone hcl 10 mg tablet  40 7 [name], [name] MD Insurance [facility]   08/05/2017 08/06/2017 oxycodone hcl 10 mg tablet  40 7 [name], [name] MD [facility]   07/02/2017 07/02/2017 oxycodone hcl 10 mg tablet  40 7 [name], [name] Insurance [facility]

## 2018-07-08 ENCOUNTER — Other Ambulatory Visit: Payer: Self-pay | Admitting: Internal Medicine

## 2018-07-08 MED ORDER — OXYCODONE HCL 10 MG PO TABS *I*
ORAL_TABLET | ORAL | 0 refills | Status: DC
Start: 2018-07-08 — End: 2018-07-22

## 2018-07-08 NOTE — Telephone Encounter (Signed)
Cameron [name] - 16 Prescriptions  Confidential Drug Report  Search Terms: Cameron [name], July 10, 1996   Search Date: 07/08/2018 03:33:29 PM   Searching on behalf of: [id number] - Tiffany L [name]   The Drug Utilization Report below displays all of the controlled substance prescriptions, if any, that your patient has filled in the last twelve months. The information displayed on this report is compiled from pharmacy submissions to the Department, and accurately reflects the information as submitted by the pharmacies.  This report was requested by: Milana Obey   Reference #: [id number]   Others' Prescriptions  Patient Name: Cameron [name] Birth Date: 1996/07/01   Address: [address] [place], [place] [address] Sex: Male   Rx Written Rx Dispensed Drug Quantity Days Supply Prescriber Name Payment Method Dispenser   06/11/2018 06/13/2018 oxycodone hcl 10 mg tablet  84 14 [name], [name] [facility]   05/30/2018 05/30/2018 oxycodone hcl 10 mg tablet  84 14 [name] [facility]   05/14/2018 05/14/2018 oxycodone hcl 10 mg tablet  84 14 [name], [name] [facility]   04/30/2018 04/30/2018 oxycodone hcl 10 mg tablet  84 14 [name], [name] [facility]   04/09/2018 04/09/2018 oxycodone hcl 10 mg tablet  84 14 [name] [facility]   03/25/2018 03/25/2018 oxycodone hcl 10 mg tablet  80 14 [name] [facility]   03/12/2018 03/12/2018 oxycodone hcl 10 mg tablet  80 13 [name], [name] [facility]   02/25/2018 02/25/2018 oxycodone hcl 10 mg tablet  80 13 [name], [name] [facility]   02/13/2018 02/13/2018 oxycodone hcl 10 mg tablet  80 13 [name], [name] [facility]   01/21/2018 01/21/2018 oxycodone hcl 10 mg  tablet  80 13 [name], [name] [facility]   01/08/2018 01/08/2018 oxycodone hcl 10 mg tablet  40 7 Lie, Ariadne [facility]   12/25/2017 12/25/2017 oxycodone hcl 10 mg tablet  40 7 [name], [name] MD Insurance [facility]   12/04/2017 12/05/2017 oxycodone hcl 10 mg tablet  40 7 [name], [name] MD Insurance [facility]   11/25/2017 11/26/2017 oxycodone hcl 10 mg tablet  40 6 [name], [name] MD Insurance [facility]   11/14/2017 11/14/2017 oxycodone hcl 10 mg tablet  40 7 [name], [name] MD Insurance [facility]   11/01/2017 11/02/2017 oxycodone hcl 10 mg tablet  40 7 [name], [name] MD [facility]   10/23/2017 10/25/2017 oxycodone hcl 10 mg tablet  40 7 [name], [name] Medicaid [facility]   10/10/2017 10/12/2017 oxycodone hcl 10 mg tablet  40 7 [name], [name] Medicaid [facility]   09/30/2017 10/02/2017 oxycodone hcl 10 mg tablet  40 7 [name], [name] Medicaid [facility]   09/18/2017 09/18/2017 oxycodone hcl 10 mg tablet  40 7 [name], [name] [facility]   09/10/2017 09/10/2017 oxycodone hcl 10 mg tablet  40 7 [name], [name] MD [facility]   09/02/2017 09/04/2017 oxycodone hcl 10 mg  tablet  40 7 [name], [name] [facility]   08/26/2017 08/27/2017 oxycodone hcl 10 mg tablet  40 7 Karle Barr MD [facility]   08/09/2017 08/13/2017 oxycodone hcl 10 mg tablet  40 7 [name], [name] MD Insurance [facility]   08/05/2017 08/06/2017 oxycodone hcl 10 mg tablet  40 7 [name], [name] Insurance [facility]   * - Drugs marked with an asterisk are compound drugs. If the compound drug is made up of more than  one controlled substance, then each controlled substance will be a separate row in the table.

## 2018-07-14 NOTE — Progress Notes (Signed)
[facility]  [address]  [place] [address]  Phone: [phone]  Fax: [phone]    REASON FOR VISIT     Sickle Cell VOC    PRIMARY DIAGNOSIS   Sickle Cell disease, type  HgSS    SUBJECTIVE     Sickle cell disease, HgSS high HgF:  Has been having increased pain with increase in activity going to work and playing basketball - notes that activity level has to reach signficiant level of exertion  No dizziness or fatigue associated with pain  Current home management:  Non pharmacologic interventions: rest, heat, fluids   Non opioid pain modifying medications: unable to take NSAID or acetaminophen per GI  Short acting Opioid pain medications: oxy IR 10 mg Q 4 hr  Long acting Opioid pain medications: none    Current Pain score:   Pain    05/14/18 0944   PainSc:   0 - No pain     Emergency Room visits for this event: 0  Hospitalizations for this event: 0    Current disease modifying therapy: none      Ulcerative colitis:  Continues on asacol and has been very consistent in dosing  stooling about once a day without blood or urgency and has had not fatigue or urgency of stooling  Continues on iron and vit D rx but taking rarely -     Medications Reviewed and changes     Current Outpatient Medications:     ferrous sulfate 325 (65 FE) MG tablet, Take 1 tablet (325 mg total) by mouth 2 times daily (with meals), Disp: 100 tablet, Rfl: 3    oxyCODONE (ROXICODONE) 10 MG immediate release tablet, Take 1 tablet (10 mg total) by mouth every 4 hours as needed for Pain.  Max daily dose: 6 tablets, Disp: 84 tablet, Rfl: 0    folic acid (FOLVITE) 1 MG tablet, Take 1 tablet (1 mg total) by mouth daily, Disp: 30 tablet, Rfl: 5    mesalamine (ASACOL HD) 800 MG tablet, Take 3 tablets (2,400 mg total) by mouth 2 times daily, Disp: 180 tablet, Rfl: 2    cholecalciferol (VITAMIN D) 1000 UNIT capsule, Take 2 capsules (2,000 Units total) by mouth daily, Disp: 60 capsule, Rfl: 5    acetaminophen (TYLENOL) 500  mg tablet, Take 500 mg by mouth every 6 hours as needed, Disp: , Rfl:     cyclobenzaprine (FLEXERIL) 10 MG tablet, Take 1 tablet (10 mg total) by mouth 3 times daily as needed for Muscle spasms, Disp: 45 tablet, Rfl: 0    naloxone (NARCAN) 4 mg/0.1 mL nasal spray, Instill 1 spray in 1 nostril once for opioid reversal. Repeat in alternating nostrils with new package every 2-3 minutes until response, Disp: 2 each, Rfl: 5  Review of Systems   Constitutional: Positive for fatigue. Negative for activity change, appetite change and unexpected weight change.   HENT: Negative for dental problem.    Respiratory: Negative for cough and shortness of breath.    Cardiovascular: Negative for chest pain.   Gastrointestinal: Negative for abdominal pain, anal bleeding, blood in stool, diarrhea and nausea.   Musculoskeletal: Positive for back pain.   Neurological: Negative for dizziness and headaches.     OBJECTIVE     BP 126/64    Pulse 88    Ht 1.854 m (6\' 1" )    Wt 85 kg (187 lb 4.8 oz)    SpO2 98%    BMI 24.71 kg/m  Physical Exam   Constitutional: He is oriented to person, place, and time. He appears well-developed and well-nourished.   HENT:   Mouth/Throat: No oropharyngeal exudate.   Eyes: No scleral icterus.   Neurological: He is alert and oriented to person, place, and time.   Skin:   Pallor of nail beds         ASSESSMENT / DIAGNOSIS     Jonatha was seen today for f/u of SCD, UC doing generally well with management of two chronic medical issues with plan to move to [place] in February of 2020 and plan to take classes at [facility] again this fall.    Diagnoses and all orders for this visit:    Sickle cell disease  Continue current management for intermittent pain.  Offered support of PT again and he will consider  Pain agreement UTD  Does not require hydroxyurea at this point although if back pain is related to his SCD related VOC events may benefit from it - cotninue to feel course of PT indicated prior to initiation - longer  and improved management of UC has seen decrease in opiate use    UC:   Repeat Vit D draw today and adjust therapy accordingly as well as iron studies with repeat CBC   If remains unreplete on iron will d/w Dr. Valla Leaver as patient has not establised care with Dr Gena Fray of adult hematology  Patient amenable to IV iron repletion     Other orders  -     mesalamine (ASACOL HD) 800 MG tablet; Take 3 tablets (2,400 mg total) by mouth 2 times daily  -     ferrous sulfate 325 (65 FE) MG tablet; Take 1 tablet (325 mg total) by mouth 2 times daily (with meals)        Pain management treatment plan   Patient has co diagnosis of ulcerative colitis dx age 22.  In addition Blood type O+ with antibodies anti-fya,jkb,lea  Outpatient Pain Regimen:  Long acting: none  Short acting; Oxy IR 10mg  Q4 hrs prn pain  Home Based titration regimen: none, needs assessment due to uncontrolled UC0  Adjunct therapies: none, holding NSAID due to ongoing LGI bleeding  Other: none    Preferred hospital: [facility]    ED/Infusion Center care plan recommendations:  Toradol 30 mg IV Q6 hrs ATC  NS 2 L IV bolus  Transfuse for HcT <18  Hydromorphone 2 mg IV Q30 min prn pain score>5  Recommend hospitalization is pain score remains >5 after 3 doses of Hydromorphone 2 mg IV    Labs on arrival: CBC, retic, CHEM14  CXR if complaints of chest pain to evaluate for infiltrate consistent with acute chest syndrome  Assess for need for bronchodilator if complaints of chest pain    Inpatient care plan recommendations:  Toradol 30 mg IV Q6 hrs ATC  NS bolus until euvolemic followed by D51/2NS at 125 cc/hr until tolerating PO well  PCA: Start with demand only Hydromorophone PCA 1 mg Q20 min with lockout 12 mg over 4 hrs Recommend titration of pain regimen until achieving pain score <5     Prior to discharge recommend 24 hours of PO pain medications to assess for sx of withdrawal with transition and continued pain control to prevent readmission    Transfuse if HcT<  18 and consider GI consult as patient's most recent cause for profound anemia is due to UC        There are no Patient Instructions on  file for this visit.    Return in about 1 month (around 06/14/2018).  --Patient instructed to call if symptoms are not improving or worsening      Electronically signed by Benay Pillow, MD 05/14/2018   7:26 PM  [facility], Phone: [phone]

## 2018-07-15 ENCOUNTER — Telehealth: Payer: Self-pay | Admitting: Primary Care

## 2018-07-15 NOTE — Telephone Encounter (Signed)
-----   Message from Delrae Alfred, MD sent at 07/14/2018  7:35 PM EDT -----  Can you remind me to set up IV iron for him when I come back?

## 2018-07-22 ENCOUNTER — Other Ambulatory Visit: Payer: Self-pay | Admitting: Internal Medicine

## 2018-07-22 NOTE — Telephone Encounter (Signed)
[facility] - 16 Prescriptions  Confidential Drug Report  Search Terms: Quintyn Dombek, [birth year]   Search Date: 07/22/2018 01:34:57 PM   Searching on behalf of: [id number] - Tiffany L [name]   The Drug Utilization Report below displays all of the controlled substance prescriptions, if any, that your patient has filled in the last twelve months. The information displayed on this report is compiled from pharmacy submissions to the Department, and accurately reflects the information as submitted by the pharmacies.  This report was requested by: Milana Obey   Reference #: [id number]   Others' Prescriptions  Patient Name: Cameron [name] Birth Date: Jan 31, 1996   Address: [address] [place], [place] [address] Sex: Male   Rx Written Rx Dispensed Drug Quantity Days Supply Prescriber Name Payment Method Dispenser   07/08/2018 07/11/2018 oxycodone hcl 10 mg tablet  84 14 [place], Barkley Bruns M [facility]   06/11/2018 06/13/2018 oxycodone hcl 10 mg tablet  84 14 [name], [name] MD Insurance [facility]   05/30/2018 05/30/2018 oxycodone hcl 10 mg tablet  84 14 [name] Insurance [facility]   05/14/2018 05/14/2018 oxycodone hcl 10 mg tablet  84 14 [name], [name] Insurance [facility]   04/30/2018 04/30/2018 oxycodone hcl 10 mg tablet  84 14 [name], [name] MD Insurance [facility]   04/09/2018 04/09/2018 oxycodone hcl 10 mg tablet  84 14 [name] [facility]   03/25/2018 03/25/2018 oxycodone hcl 10 mg tablet  80 14 [name] [facility]   03/12/2018 03/12/2018 oxycodone hcl 10 mg tablet  80 13 [name], [name] Insurance [facility]   02/25/2018 02/25/2018 oxycodone hcl 10 mg tablet  80 13 [name], [name] [facility]   02/13/2018 02/13/2018 oxycodone hcl 10 mg  tablet  80 13 [name], [name] [facility]   01/21/2018 01/21/2018 oxycodone hcl 10 mg tablet  80 13 [name], [name] [facility]   01/08/2018 01/08/2018 oxycodone hcl 10 mg tablet  40 7 Lie, Ariadne [facility]   12/25/2017 12/25/2017 oxycodone hcl 10 mg tablet  40 7 [name], [name] MD Insurance [facility]   12/04/2017 12/05/2017 oxycodone hcl 10 mg tablet  40 7 [name], [name] MD Insurance [facility]   11/25/2017 11/26/2017 oxycodone hcl 10 mg tablet  40 6 [name], [name] MD Insurance [facility]   11/14/2017 11/14/2017 oxycodone hcl 10 mg tablet  40 7 [name], [name] MD Insurance [facility]   11/01/2017 11/02/2017 oxycodone hcl 10 mg tablet  40 7 [name], [name] MD Insurance [facility]   10/23/2017 10/25/2017 oxycodone hcl 10 mg tablet  40 7 [name], [name] Medicaid [facility]   10/10/2017 10/12/2017 oxycodone hcl 10 mg tablet  40 7 [name], [name] Medicaid [facility]   09/30/2017 10/02/2017 oxycodone hcl 10 mg tablet  40 7 [name], [name] MD Medicaid [facility]   09/18/2017 09/18/2017 oxycodone hcl 10 mg tablet  40 7 [name], [name] [facility]   09/10/2017 09/10/2017 oxycodone hcl 10 mg tablet  40 7 [name], [name] MD Insurance [facility]   09/02/2017 09/04/2017 oxycodone hcl 10 mg tablet  40 7 [name], [name] [facility]   08/26/2017 08/27/2017 oxycodone hcl 10 mg tablet  40 7 Karle Barr MD Cash [facility]   08/09/2017 08/13/2017 oxycodone hcl 10 mg tablet  40 7 [name], [name] MD Insurance [facility]   08/05/2017 08/06/2017 oxycodone hcl 10 mg tablet  40 7 [name], [name] Insurance [facility]   * - Drugs marked with an asterisk are compound drugs. If the compound drug is made up of more than one controlled substance, then each controlled substance will be a separate row in the table

## 2018-07-23 ENCOUNTER — Other Ambulatory Visit: Payer: Self-pay | Admitting: Primary Care

## 2018-07-23 ENCOUNTER — Telehealth: Payer: Self-pay

## 2018-07-23 MED ORDER — OXYCODONE HCL 10 MG PO TABS *I*
ORAL_TABLET | ORAL | 0 refills | Status: DC
Start: 2018-07-23 — End: 2018-08-06

## 2018-07-23 MED ORDER — FOLIC ACID 1 MG PO TABS *I*
1.0000 mg | ORAL_TABLET | Freq: Every day | ORAL | 5 refills | Status: DC
Start: 2018-07-23 — End: 2018-08-06

## 2018-07-23 MED ORDER — MESALAMINE 800 MG PO TBEC *I*
2400.0000 mg | DELAYED_RELEASE_TABLET | Freq: Two times a day (BID) | ORAL | 2 refills | Status: DC
Start: 2018-07-23 — End: 2018-11-14

## 2018-07-23 NOTE — Telephone Encounter (Signed)
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  Appointments:   Future Appointments   Date Time Provider Department Center   07/29/2018  4:00 PM [name], Diona Browner, MD [facility] None   08/05/2018  1:30 PM Lavenia Atlas, MD [facility] None           Was unable to reach the patient.  Left a message on voice mail letting patient know of the time, date and address of the appointment and gave our phone number incase of any issues or problems.

## 2018-07-29 ENCOUNTER — Encounter: Payer: Self-pay | Admitting: Primary Care

## 2018-07-29 ENCOUNTER — Ambulatory Visit: Payer: Medicaid Other | Admitting: Primary Care

## 2018-07-29 ENCOUNTER — Other Ambulatory Visit: Payer: Self-pay | Admitting: Primary Care

## 2018-07-29 DIAGNOSIS — Z5321 Procedure and treatment not carried out due to patient leaving prior to being seen by health care provider: Secondary | ICD-10-CM

## 2018-07-29 DIAGNOSIS — E611 Iron deficiency: Secondary | ICD-10-CM | POA: Insufficient documentation

## 2018-07-29 NOTE — Telephone Encounter (Addendum)
error    This encounter was created in error - please disregard.

## 2018-07-29 NOTE — Telephone Encounter (Signed)
Placed orders in [place] for iron infusion - will need 2 appointments for dosing then repeat iron studies and consider next steps in further supplementation

## 2018-07-31 NOTE — Telephone Encounter (Signed)
Spoke with [facility] infusion [phone]  Requesting provider specify in orders what type of Iron pt is to receive  Also will need a second day put in  Then pt can be scheduled

## 2018-08-01 NOTE — Telephone Encounter (Signed)
First dose scheduled for 11/18 at 2:30  Will need orders for second IV infusion in order to schedule a second appointment  Will route to MD    L/m for pt with details  Will attempt to reach again early next week

## 2018-08-04 NOTE — Telephone Encounter (Signed)
Attempted to reach both the pt and his mother  No answer on either phone  No voicemail available  Will try to reach later

## 2018-08-05 ENCOUNTER — Ambulatory Visit: Payer: Medicaid Other | Attending: Internal Medicine | Admitting: Internal Medicine

## 2018-08-05 ENCOUNTER — Encounter: Payer: Self-pay | Admitting: Internal Medicine

## 2018-08-05 ENCOUNTER — Inpatient Hospital Stay: Admit: 2018-08-05 | Payer: Medicaid Other | Admitting: Student in an Organized Health Care Education/Training Program

## 2018-08-05 VITALS — BP 126/56 | HR 91 | Ht 73.0 in | Wt 194.8 lb

## 2018-08-05 DIAGNOSIS — D571 Sickle-cell disease without crisis: Secondary | ICD-10-CM

## 2018-08-05 DIAGNOSIS — D564 Hereditary persistence of fetal hemoglobin [HPFH]: Secondary | ICD-10-CM

## 2018-08-05 DIAGNOSIS — E611 Iron deficiency: Secondary | ICD-10-CM

## 2018-08-05 DIAGNOSIS — K5180 Other ulcerative colitis without complications: Secondary | ICD-10-CM

## 2018-08-05 NOTE — Telephone Encounter (Signed)
Thank you :)

## 2018-08-05 NOTE — Progress Notes (Signed)
[facility]  [address]  [place] [id number]  Phone: [phone]  Fax: [phone]    REASON FOR VISIT     Sickle Cell VOC    PRIMARY DIAGNOSIS   Sickle Cell disease, type  HgSS    SUBJECTIVE     No pain today    Current home management:   Non pharmacologic interventions: heat  Non opioid pain modifying medications: none  Short acting Opioid pain medications: oxycodone IR 10mg  every other day   Long acting Opioid pain medications: none    Ulcerative Colitis:  Mesalamine 800mg  BID  Denies blood, diarreha, mucus in stool.   Denies abdominal pain     Iron infusions:  Has infusion appointment scheduled for 11/18 at 2:30pm    PEG:  Prior (PEG) Pain Score:   Current Pain score: 0   Pain    08/05/18 1458   PainSc:   0 - No pain     Pain on average the last week: 0  Degree of interference with Enjoyment of life in last week: 0  How has pain interfered with your General activity in the last week: 0  PEG SCORE 0    Emergency Room visits for this event: no  Hospitalizations for this event: 0    Current disease modifying therapy: none    Medications Reviewed and changes     Current Outpatient Medications:     folic acid (FOLVITE) 1 MG tablet, Take 1 tablet (1 mg total) by mouth daily, Disp: 30 tablet, Rfl: 5    mesalamine (ASACOL HD) 800 MG tablet, Take 3 tablets (2,400 mg total) by mouth 2 times daily, Disp: 180 tablet, Rfl: 2    oxyCODONE (ROXICODONE) 10 MG immediate release tablet, Take 1 tablet (10 mg total) by mouth every 4 hours as needed for Pain.  Max daily dose: 6 tablets, Disp: 84 tablet, Rfl: 0    cholecalciferol (VITAMIN D) 1000 UNIT capsule, Take 2 capsules (2,000 Units total) by mouth daily, Disp: 60 capsule, Rfl: 5    ferrous sulfate 325 (65 FE) MG tablet, Take 1 tablet (325 mg total) by mouth 2 times daily (with meals), Disp: 100 tablet, Rfl: 3    acetaminophen (TYLENOL) 500 mg tablet, Take 500 mg by mouth every 6 hours as needed, Disp: , Rfl:     cyclobenzaprine (FLEXERIL) 10  MG tablet, Take 1 tablet (10 mg total) by mouth 3 times daily as needed for Muscle spasms, Disp: 45 tablet, Rfl: 0    naloxone (NARCAN) 4 mg/0.1 mL nasal spray, Instill 1 spray in 1 nostril once for opioid reversal. Repeat in alternating nostrils with new package every 2-3 minutes until response, Disp: 2 each, Rfl: 5  Review of Systems   Constitutional: Negative.    HENT: Negative.    Eyes: Negative.    Respiratory: Negative.    Cardiovascular: Negative.    Gastrointestinal: Negative for abdominal pain, constipation, diarrhea, nausea and vomiting.        Eating and drinking okay   Endocrine: Negative.    Genitourinary: Negative.    Musculoskeletal: Negative.    Skin: Negative.    Allergic/Immunologic: Positive for immunocompromised state.   Neurological: Negative.    Hematological: Negative.    Psychiatric/Behavioral: Negative.      OBJECTIVE     BP 126/56    Pulse 91    Ht 1.854 m (6\' 1" )    Wt 88.4 kg (194 lb 12.8 oz)    SpO2 100%  BMI 25.70 kg/m   Physical Exam   Constitutional: Vital signs are normal. He appears well-developed.  Non-toxic appearance. He does not have a sickly appearance. He does not appear ill. No distress.   HENT:   Head: Normocephalic.   Nose: Nose normal.   Eyes: Conjunctivae, EOM and lids are normal. No scleral icterus.   Neck: Trachea normal and normal range of motion. Neck supple.   Cardiovascular: Normal rate, regular rhythm and normal heart sounds.   Pulmonary/Chest: Effort normal and breath sounds normal. No respiratory distress.   Abdominal: Soft. Normal appearance and bowel sounds are normal. There is no tenderness. Musculoskeletal: Normal range of motion.     Neurological: He is alert.   Skin: Skin is warm.   Psychiatric: He has a normal mood and affect. His behavior is normal.           Lab results: 05/14/18  1723   WBC 5.4   Hemoglobin 5.3*   Hematocrit 20*   RBC 3.2*   Platelets 275         ASSESSMENT / DIAGNOSIS     Cameron [name] was seen today for sickle cell.    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 Cameron [name] has no complaints and is using PRN oxycodone IR 10mg  every other day.   Continue folic acid 1mg  daily  Continue oxycodone 10mg  Q4 hours PRN  Continue tylenol PRN  Continue Flexeril PRN  Symptoms management reviewed: good hydration by drinking lots of water, warm pads/warm baths, rest, stay out of cold weather, reduce stress/triggers    Other ulcerative colitis without complication:   -no evidence of acute flare  -no physical signs/symptoms present per subjective history     Iron deficiency: r/t UC (see above)  -2 iron infusions: first infusion 11/18 at 2:30pm   -Continue Iron 325mg  Daily    HCM:   Educated on healthy lifestyle and safety. No current issues at this time.  Influenza vaccination: up to date  Drug Use/Smoking: none, reinforced healthy habits        Patient Instructions   Infusion center for iron infusion on 11/18 at 2:30pm     Call if you need a refill of pain medications.         Pain management treatment plan   Patient has co diagnosis of ulcerative colitis dx age 22.  In addition Blood type O+ with antibodies anti-fya,jkb,lea  Outpatient Pain Regimen:  Long acting: none  Short acting; Oxy IR 10mg  Q4 hrs prn pain  Home Based titration regimen: none, needs assessment due to uncontrolled UC0  Adjunct therapies: none, holding NSAID due to ongoing LGI bleeding  Other: none    Preferred hospital: [facility]    ED/Infusion Center care plan recommendations:  Toradol 30 mg IV Q6 hrs ATC  NS 2 L IV bolus  Transfuse for HcT <18  Hydromorphone 2 mg IV Q30 min prn pain score>5  Recommend hospitalization is pain score remains >5 after 3 doses of Hydromorphone 2 mg IV    Labs on arrival: CBC, retic, CHEM14  CXR if complaints of chest pain to evaluate for infiltrate consistent with acute chest syndrome  Assess for need  for bronchodilator if complaints of chest pain    Inpatient care plan recommendations:  Toradol 30 mg IV Q6 hrs ATC  NS bolus until euvolemic followed  by D51/2NS at 125 cc/hr until tolerating PO well  PCA: Start with demand only Hydromorophone PCA 1 mg Q20 min with lockout 12 mg over 4 hrs Recommend titration of pain regimen until achieving pain score <5     Prior to discharge recommend 24 hours of PO pain medications to assess for sx of withdrawal with transition and continued pain control to prevent readmission    Transfuse if HcT< 18 and consider GI consult as patient's most recent cause for profound anemia is due to UC    Venofer orders in Dr Concha [place]            No follow-ups on file.  --Patient instructed to call if symptoms are not improving or worsening      Electronically signed by Jodi Geralds, NP 08/05/2018   8:44 AM  [facility], Phone: [phone]

## 2018-08-05 NOTE — Telephone Encounter (Signed)
Pt seen in office today  Writer reviewed his upcoming infusion [facility]  Pt verbalized understanding  Also discussed that he will need a second infusion    Writer routing to MD for orders

## 2018-08-05 NOTE — Progress Notes (Signed)
[facility] - 09 Prescriptions  Confidential Drug Report  Search Terms: Vann Okerlund, [birth year]   Search Date: 08/05/2018 03:35:39 PM   Searching on behalf of: [id number] - [name]   The Drug Utilization Report below displays all of the controlled substance prescriptions, if any, that your patient has filled in the last twelve months. The information displayed on this report is compiled from pharmacy submissions to the Department, and accurately reflects the information as submitted by the pharmacies.  This report was requested by: Marylene Land Shahidah Nesbitt   Reference #: [id number]   Others' Prescriptions  Patient Name: Cameron [name] Birth Date: [birth year]   Address: [address] [place], [place] [address] Sex: Male   Rx Written Rx Dispensed Drug Quantity Days Supply Prescriber Name Payment Method Dispenser   07/23/2018 07/24/2018 oxycodone hcl 10 mg tablet  84 14 [name], [name] MD [facility]   07/08/2018 07/11/2018 oxycodone hcl 10 mg tablet  84 14 [name] Insurance [facility]   06/27/2018 06/27/2018 oxycodone hcl 10 mg tablet  84 14 [name] Insurance [facility]   06/11/2018 06/13/2018 oxycodone hcl 10 mg tablet  84 14 [name], [name] MD Insurance [facility]   05/30/2018 05/30/2018 oxycodone hcl 10 mg tablet  84 14 [name] Insurance [facility]   05/14/2018 05/14/2018 oxycodone hcl 10 mg tablet  84 14 [name], [name] Insurance [facility]   04/30/2018 04/30/2018 oxycodone hcl 10 mg tablet  84 14 [name], [name] Insurance [facility]   04/09/2018 04/09/2018 oxycodone hcl 10 mg tablet  84 14 [name] [facility]   03/25/2018 03/25/2018 oxycodone hcl 10 mg tablet  80 14 [name] Insurance [facility]   03/12/2018 03/12/2018 oxycodone hcl  10 mg tablet  80 13 [name], [name] MD Insurance [facility]   02/25/2018 02/25/2018 oxycodone hcl 10 mg tablet  80 13 [name], [name] Insurance [facility]   02/13/2018 02/13/2018 oxycodone hcl 10 mg tablet  80 13 [name], [name] Insurance [facility]   01/21/2018 01/21/2018 oxycodone hcl 10 mg tablet  80 13 [name], [name] Insurance [facility]   01/08/2018 01/08/2018 oxycodone hcl 10 mg tablet  40 7 Lie, Ariadne Insurance [facility]   12/25/2017 12/25/2017 oxycodone hcl 10 mg tablet  40 7 [name], [name] MD Insurance [facility]   12/04/2017 12/05/2017 oxycodone hcl 10 mg tablet  40 7 [name], [name] MD Insurance [facility]   11/25/2017 11/26/2017 oxycodone hcl 10 mg tablet  40 6 [name], [name] MD Insurance [facility]   11/14/2017 11/14/2017 oxycodone hcl 10 mg tablet  40 7 [name], [name] MD Insurance [facility]   11/01/2017 11/02/2017 oxycodone hcl 10 mg tablet  40 7 [name], [name] MD Insurance [facility]   10/23/2017 10/25/2017 oxycodone hcl 10 mg tablet  40 7 [name], [name] MD Medicaid [facility]   10/10/2017 10/12/2017 oxycodone hcl 10 mg tablet  40 7 [name], [name] MD Medicaid [facility]   09/30/2017 10/02/2017 oxycodone hcl 10 mg tablet  40 7 [name], [name] [facility]   09/18/2017 09/18/2017 oxycodone hcl 10 mg tablet  40 7 [name], [name] Cash [facility]   09/10/2017 09/10/2017 oxycodone hcl 10 mg tablet  40 7 [name], Gwenith Spitziffany L MD Insurance [facility]   09/02/2017 09/04/2017 oxycodone hcl 10 mg tablet  40 7 [name], [name] Cash [facility]   08/26/2017 08/27/2017 oxycodone hcl 10 mg tablet  40 7 Karle BarrLie, Ariadne G MD Cash [facility]   08/09/2017 08/13/2017 oxycodone hcl 10 mg tablet  40 7 [name], Gwenith Spitziffany L MD Insurance [facility]   08/05/2017 08/06/2017 oxycodone hcl 10 mg tablet  40 7 [name], [name] Insurance [facility] [place]   * - Drugs marked with an asterisk are compound drugs. If the compound drug is made up of more than one controlled substance, then each controlled substance will be a separate row in the table.

## 2018-08-05 NOTE — Patient Instructions (Addendum)
Infusion center for iron infusion on 11/18 at 2:30pm     Call if you need a refill of pain medications.

## 2018-08-05 NOTE — Telephone Encounter (Signed)
Called and l/m for pt  Reminded him of appointment at [facility] infusion  Left details and requested a call back to the office    Attempted to reach his mother  No answer  No voicemail

## 2018-08-06 ENCOUNTER — Other Ambulatory Visit: Payer: Self-pay | Admitting: Primary Care

## 2018-08-06 NOTE — Telephone Encounter (Signed)
Patient Name: Cameron [name] Birth Date: [birth year]   Address: [address] [place], [place] [address] Sex: Male   Rx Written Rx Dispensed Drug Quantity Days Supply Prescriber Name Payment Method Dispenser   07/23/2018 07/24/2018 oxycodone hcl 10 mg tablet  84 14 [name], [name] MD Insurance [facility]   07/08/2018 07/11/2018 oxycodone hcl 10 mg tablet  84 14 [name] Insurance [facility]   06/27/2018 06/27/2018 oxycodone hcl 10 mg tablet  84 14 [name] [facility]   06/11/2018 06/13/2018 oxycodone hcl 10 mg tablet  84 14 [name], [name] MD Insurance [facility]   05/30/2018 05/30/2018 oxycodone hcl 10 mg tablet  84 14 [name] Insurance [facility]   05/14/2018 05/14/2018 oxycodone hcl 10 mg tablet  84 14 [name], [name] Insurance [facility]   04/30/2018 04/30/2018 oxycodone hcl 10 mg tablet  84 14 [name], [name] Insurance [facility]   04/09/2018 04/09/2018 oxycodone hcl 10 mg tablet  84 14 [name] Insurance [facility]   03/25/2018 03/25/2018 oxycodone hcl 10 mg tablet  80 14 [name] Insurance [facility]   03/12/2018 03/12/2018 oxycodone hcl 10 mg tablet  80 13 [name], [name] Insurance [facility]   02/25/2018 02/25/2018 oxycodone hcl 10 mg tablet  80 13 [name], [name] Insurance [facility]   02/13/2018 02/13/2018 oxycodone hcl 10 mg tablet  80 13 [name], [name] Insurance [facility]   01/21/2018 01/21/2018 oxycodone hcl 10 mg tablet  80 13 [name], [name] Insurance [facility]   01/08/2018 01/08/2018 oxycodone hcl 10 mg tablet  40 7 Lie, Ariadne Insurance [facility]   12/25/2017 12/25/2017 oxycodone hcl 10 mg tablet  40 7 [name],  [name] MD Insurance [facility]   12/04/2017 12/05/2017 oxycodone hcl 10 mg tablet  40 7 [name], [name] MD Insurance [facility]   11/25/2017 11/26/2017 oxycodone hcl 10 mg tablet  40 6 [name], [name] MD Insurance [facility]   11/14/2017 11/14/2017 oxycodone hcl 10 mg tablet  40 7 [name], [name] Insurance [facility]   11/01/2017 11/02/2017 oxycodone hcl 10 mg tablet  40 7 [name], [name] MD Insurance [facility]   10/23/2017 10/25/2017 oxycodone hcl 10 mg tablet  40 7 [name], [name] Medicaid [facility]   10/10/2017 10/12/2017 oxycodone hcl 10 mg tablet  40 7 [name], [name] Medicaid [facility]   09/30/2017 10/02/2017 oxycodone hcl 10 mg tablet  40 7 [name], [name] Medicaid [facility]   09/18/2017 09/18/2017 oxycodone hcl 10 mg tablet  40 7 [name], [name] Cash [facility]   09/10/2017 09/10/2017 oxycodone hcl 10 mg tablet  40 7 [name], [name] MD Insurance [facility]   09/02/2017 09/04/2017 oxycodone hcl 10 mg tablet  40 7 [name], [name] Cash [facility]   08/26/2017 08/27/2017 oxycodone hcl 10 mg tablet  40 7 Karle Barr MD Cash [facility] [place] [facility] [employer]  08/09/2017 08/13/2017 oxycodone hcl 10 mg tablet  40 7 [name], [name] [facility] [place] [facility] [place]

## 2018-08-07 ENCOUNTER — Telehealth: Payer: Self-pay | Admitting: Primary Care

## 2018-08-07 MED ORDER — OXYCODONE HCL 10 MG PO TABS *I*
ORAL_TABLET | ORAL | 0 refills | Status: DC
Start: 2018-08-07 — End: 2018-08-19

## 2018-08-07 MED ORDER — FOLIC ACID 1 MG PO TABS *I*
1.0000 mg | ORAL_TABLET | Freq: Every day | ORAL | 5 refills | Status: DC
Start: 2018-08-07 — End: 2019-03-30

## 2018-08-07 NOTE — Telephone Encounter (Signed)
Routing to MD.

## 2018-08-07 NOTE — Telephone Encounter (Signed)
Mom states patient is completely out of oxycodone and medications were due yesterday.

## 2018-08-07 NOTE — Telephone Encounter (Signed)
Connie's Lobbyist(Nurse Manager) phone number if there are any questions is [phone].      ----- Message -----   From: Flonnie OvermanEmens, Constance, RN   Sent: 08/07/2018  9:42 AM EST   To: Serita KyleHeather Haygood   Subject: RE: Orders                      There is an order, but she didn't use the plan for venofer and it doesn't contain all the parts we need. I can talk with her, if you'd like, but the plan she needs to use is the OP venofer q 14 days (or 28 days depending on which she wants). She will need to change the dose of venofer from 100mg  to the 200mg  she has in the current plan.   Junious Dresseronnie   ----- Message -----   From: Serita KyleHaygood, Heather   Sent: 08/07/2018  9:24 AM EST   To: Flonnie Overmanonstance Emens, RN   Subject: Orders                        When you get a chance. Can you check to see if the doctor put in his treatment X 2. The order is not clear to me, but his doctor's office just called and said that a second treatment was placed.

## 2018-08-07 NOTE — Telephone Encounter (Signed)
I have put a reminder in my calendar to reach out to him early Monday morning for a reminder.

## 2018-08-07 NOTE — Telephone Encounter (Signed)
Please notify patient that refill completed to listed pharmacy

## 2018-08-07 NOTE — Telephone Encounter (Signed)
Thank you :)

## 2018-08-07 NOTE — Telephone Encounter (Signed)
Spoke with [facility] infusion  They will set second appointment up with pt when he is seen on the 18th  Easier to coordinate his schedule with pt being there      Pt is aware of appointment   Writer attempted to reach pts mother again in order to have her aware of appointment as well  No answer/no voicemail  Routing to MD

## 2018-08-11 ENCOUNTER — Ambulatory Visit: Payer: Medicaid Other

## 2018-08-11 ENCOUNTER — Other Ambulatory Visit
Admission: RE | Admit: 2018-08-11 | Discharge: 2018-08-11 | Disposition: A | Discharge status: Home / Self Care | Payer: Medicaid Other | Source: Ambulatory Visit | Attending: Primary Care | Admitting: Primary Care

## 2018-08-11 MED FILL — Iron Sucrose Inj 20 MG/ML (Fe Equiv): INTRAVENOUS | Qty: 10 | Status: AC

## 2018-08-13 NOTE — Telephone Encounter (Signed)
Plan included using OP Venopher Q 14 day order set - appreciate Junious DresserConnie outlining it's availability - please reach out to mother to schedule directly with infusion if possible so avoid future no shows  Thank you for all the help

## 2018-08-13 NOTE — Addendum Note (Signed)
Addended byJannet Mantis: Amylia Collazos on: 08/13/2018 10:30 AM     Modules accepted: Orders

## 2018-08-19 ENCOUNTER — Other Ambulatory Visit: Payer: Self-pay | Admitting: Primary Care

## 2018-08-19 NOTE — Telephone Encounter (Signed)
[facility] - 64 Prescriptions  Confidential Drug Report  Search Terms: Ragan Reale, 1996-05-17   Search Date: 08/19/2018 10:13:20 AM   Searching on behalf of: [id number] - Karle Barr   The Drug Utilization Report below displays all of the controlled substance prescriptions, if any, that your patient has filled in the last twelve months. The information displayed on this report is compiled from pharmacy submissions to the Department, and accurately reflects the information as submitted by the pharmacies.  This report was requested by: Alvira Philips   Reference #: [id number]   Others' Prescriptions  Patient Name: Cameron [name] Birth Date: [birth year]   Address: [address] [place], [place] [address] Sex: Male   Rx Written Rx Dispensed Drug Quantity Days Supply Prescriber Name Payment Method Dispenser   08/07/2018 08/07/2018 oxycodone hcl 10 mg tablet  84 14 [name], [name] MD Insurance [facility]   07/23/2018 07/24/2018 oxycodone hcl 10 mg tablet  84 14 [name], [name] MD Insurance [facility]   07/08/2018 07/11/2018 oxycodone hcl 10 mg tablet  84 14 [name] Insurance [facility]   06/27/2018 06/27/2018 oxycodone hcl 10 mg tablet  84 14 [name] Insurance [facility]   06/11/2018 06/13/2018 oxycodone hcl 10 mg tablet  84 14 [name], [name] MD Insurance [facility]   05/30/2018 05/30/2018 oxycodone hcl 10 mg tablet  84 14 [name] Insurance [facility]   05/14/2018 05/14/2018 oxycodone hcl 10 mg tablet  84 14 [name], [name] Insurance [facility]   04/30/2018 04/30/2018 oxycodone hcl 10 mg tablet  84 14 [name], [name] Insurance [facility]   04/09/2018 04/09/2018 oxycodone hcl 10 mg tablet  84 14 [name] Insurance [facility]   03/25/2018 03/25/2018 oxycodone hcl 10 mg tablet   80 14 [name] Insurance [facility]   03/12/2018 03/12/2018 oxycodone hcl 10 mg tablet  80 13 [name], [name] MD Insurance [facility]   02/25/2018 02/25/2018 oxycodone hcl 10 mg tablet  80 13 [name], [name] Insurance [facility]   02/13/2018 02/13/2018 oxycodone hcl 10 mg tablet  80 13 [name], [name] Insurance [facility]   01/21/2018 01/21/2018 oxycodone hcl 10 mg tablet  80 13 [name], [name] Insurance [facility]   01/08/2018 01/08/2018 oxycodone hcl 10 mg tablet  40 7 Lie, Ariadne Insurance [facility]   12/25/2017 12/25/2017 oxycodone hcl 10 mg tablet  40 7 [name], [name] MD Insurance [facility]   12/04/2017 12/05/2017 oxycodone hcl 10 mg tablet  40 7 [name], [name] MD Insurance [facility]   11/25/2017 11/26/2017 oxycodone hcl 10 mg tablet  40 6 [name], [name] MD Insurance [facility]   11/14/2017 11/14/2017 oxycodone hcl 10 mg tablet  40 7 [name], [name] MD Insurance [facility]   11/01/2017 11/02/2017 oxycodone hcl 10 mg tablet  40 7 [name], [name] MD Insurance [facility]   10/23/2017 10/25/2017 oxycodone hcl 10 mg tablet  40 7 [name], [name] MD Medicaid [facility]   10/10/2017 10/12/2017 oxycodone hcl 10 mg tablet  40 7 [name], [name] [facility]   09/30/2017 10/02/2017 oxycodone hcl 10 mg tablet  40 7 [name], [name] Medicaid [facility]   09/18/2017 09/18/2017 oxycodone hcl 10 mg tablet  40 7 [name], [name] Cash [facility]   09/10/2017 09/10/2017 oxycodone hcl 10 mg tablet  40 7 [name], Gwenith Spitziffany L MD Insurance [facility]   09/02/2017 09/04/2017 oxycodone hcl 10 mg tablet  40 7 [name], [name] Cash [facility]   08/26/2017 08/27/2017 oxycodone hcl 10 mg tablet  40 7 Karle BarrLie, Ariadne G MD Cash [facility]   * - Drugs marked with an asterisk are compound drugs. If the compound drug is made up of more than one controlled substance, then each controlled substance will be a separate row in the table.

## 2018-08-20 MED ORDER — OXYCODONE HCL 10 MG PO TABS *I*
ORAL_TABLET | ORAL | 0 refills | Status: DC
Start: 2018-08-20 — End: 2018-09-02

## 2018-08-20 NOTE — Telephone Encounter (Signed)
Patient needs appointment prior to next refill and needs to reschedule appointment for iron replacement in infusion

## 2018-08-25 NOTE — Telephone Encounter (Signed)
Patient is going out of town from 12/20 to 09/24/2018. Patient is scheduled with Roselind RilyKristine Reinhardt on 12/10. Patient reminded about rescheduling appointment at infusion center.

## 2018-08-26 NOTE — Telephone Encounter (Signed)
Attempted to reach pts mother  Number not working  Called and l/m for pt  Requested a call back to the office and commented that it was for setting up appointments for iron infusion  Number provided

## 2018-08-27 ENCOUNTER — Telehealth: Payer: Self-pay

## 2018-08-27 NOTE — Telephone Encounter (Signed)
@  [email or web address]  @[email or web address] @    Reminder Phone Call    Patient ID: Cameron [name] is a 22 y.o. male.  Appointments:   Future Appointments   Date Time Provider Department Center   09/02/2018  9:00 AM Jodi Geraldseinhardt, Kristine M, NP [facility] None       Was unable to reach the patient.  Left a message on voice mail letting patient know of the time, date and address of the appointment and gave our phone number incase of any issues or problems.

## 2018-08-29 NOTE — Telephone Encounter (Signed)
Attempted to reach pts mother again  No answer, no voicemail  Routing to care management for possible reach out

## 2018-08-29 NOTE — Telephone Encounter (Signed)
I sent a text message with this information, asking that he schedule an appointment that fits his schedule  And get back to me to inform me of the date/time of appointment.

## 2018-09-01 NOTE — Progress Notes (Signed)
[facility]  [address]  [place] [id number]  Phone: [phone]  Fax: [phone]     REASON FOR VISIT      Follow up    PRIMARY DIAGNOSIS      SCD      SUBJECTIVE        SCD disease  Pain is 0/10 today   Doesn't flair up much at all   In a month he will have very minimal pain crisis   Short acting pain medication: oxycodone 10mg  at night but he only takes this about every 2-3 days, he will often have back pain at night   Tylenol: takes this during the day for pain so it doesn't make him sleepy- and this doesn't help his pain much  Cant take ibuprofen because of UC.   Has not tried flexeril for his back pain.     Iron infusion  Last appointment was 11/18 with hematology- missed appointment - he had to work that today, he states that he talked to the his care manager about trying to reschedule it.     UC:  No issues, no diarrhea  Denies blood/mucus in his stool  Denies abdominal pain      Right foot injury  Twisted it during basketball   Denies swelling  Can walk on it- with a limp   He has been using ice on it  Happened on Sunday     Social:  Leaving for [place] in a week or so for christmas  Mom's family is there   Working at [employer]  Not in school right now     Review of Systems   All other systems reviewed and are negative.    Review of Systems as per HPI above    Past Medical History, Social History, Family History, and Medications/allergies reviewed during this visit    Current Outpatient Medications   Medication    folic acid (FOLVITE) 1 MG tablet    mesalamine (ASACOL HD) 800 MG tablet    cholecalciferol (VITAMIN D) 1000 UNIT capsule    ferrous sulfate 325 (65 FE) MG tablet    oxyCODONE (ROXICODONE) 10 MG immediate release tablet    acetaminophen (TYLENOL) 500 mg tablet    cyclobenzaprine (FLEXERIL) 10 MG tablet    naloxone (NARCAN) 4 mg/0.1 mL nasal spray     No current facility-administered medications for this visit.          OBJECTIVE      BP 112/58    Pulse 103    Ht 1.854  m (6' 0.99")    Wt 90.6 kg (199 lb 11.2 oz)    SpO2 100%    BMI 26.35 kg/m     Physical Exam   Constitutional: He is oriented to person, place, and time and well-developed, well-nourished, and in no distress. No distress.   HENT:   Head: Normocephalic and atraumatic.   Mouth/Throat: Oropharynx is clear and moist.   Eyes: Pupils are equal, round, and reactive to light. EOM are normal. No scleral icterus.   Neck: Normal range of motion. Neck supple.   Cardiovascular: Normal rate, regular rhythm and normal heart sounds.   Pulmonary/Chest: Effort normal. No respiratory distress. He has no wheezes. He has no rales.   Abdominal: Soft. Bowel sounds are normal. He exhibits no distension. There is no tenderness. Musculoskeletal: Normal range of motion.         General: No deformity or edema.  Lumbar back: He exhibits pain. He exhibits normal range of motion, no tenderness, no swelling and no spasm.     Neurological: He is alert and oriented to person, place, and time. Gait normal.   Skin: Skin is warm and dry. No rash noted. He is not diaphoretic. No erythema. No pallor.   Psychiatric: Mood normal. He has a flat affect.   Vitals reviewed.        ASSESSMENT / DIAGNOSIS       1. 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 Jerrett reports needing to take his pain medication minimally (one tab about every night or every other night).   Back pain description most consistent with musculoskeletal- educated on treatment plan for this and possibility that it triggers sickle cell pain   -will change oxycodone IR 10mg  BID PRN to better reflect his usage pattern of taking one tab nightly with an extra tab per day PRN for when pain increases.   -Symptoms management reviewed: good hydration by drinking lots of water, warm pads/warm baths, rest, stay out of cold weather, reduce stress/triggers  -continue folic acid as prescribed     2. Ulcerative pancolitis without  complication  Kirk missed infusion center appointment for iron infusion. Discussed barriers and options for how to remember appointments. Care management to engage and work with Kimi on this.   -educated and discussed importance of this treatment  -continue iron 325mg  BID    3. Back pain, unspecified back location, unspecified back pain laterality, unspecified chronicity  Education/discussion of assessment and plan tailored to patient's level of health literacy and communicated with Kahlel   -continue tylenol for inflammation  -ice/heat for comfort  -educated and discussed stretches targeted at for lower back  -flexeril 10mg  TID PRN       4. Right ankle injury  Sprained ankle during basketball- unlikely fractured d/t physical exam and able to ambulate on ankle   -recommended ice, rest, compression  -tylenol 650mg  Q6hrs PRN      Orders Placed This Encounter   No orders placed during this encounter.     Patient Instructions   Right ankle:  -Tylenol   -Ice  -Wrap  -Rest    Sickle cell:  Oxycodone 10mg  as needed for pain  Try using voltaren gel to your lower back when this hurts and see if this reduces the amount you need for the oxycodone  Flexeril for back pain: see if this helps because your back pain is most likely muscle-skeletal pain  Back stretches: look some up on how to do - try focusing and stretching your quads/hamstrings because sometimes this can cause back pain     Iron infusion: need to reschedule this. -I'll have your care manager to help you schedule this          --Patient instructed to call if symptoms are not improving or worsening  --Follow-up arranged  No follow-ups on file.     Electronically signed by Jodi Geralds, NP , 09/03/2018 @   [facility], Phone: [phone]

## 2018-09-02 ENCOUNTER — Encounter: Payer: Self-pay | Admitting: Internal Medicine

## 2018-09-02 ENCOUNTER — Ambulatory Visit: Payer: Medicaid Other | Attending: Internal Medicine | Admitting: Internal Medicine

## 2018-09-02 VITALS — BP 112/58 | HR 103 | Ht 72.99 in | Wt 199.7 lb

## 2018-09-02 DIAGNOSIS — D564 Hereditary persistence of fetal hemoglobin [HPFH]: Secondary | ICD-10-CM

## 2018-09-02 DIAGNOSIS — K5100 Ulcerative (chronic) pancolitis without complications: Secondary | ICD-10-CM

## 2018-09-02 DIAGNOSIS — D571 Sickle-cell disease without crisis: Secondary | ICD-10-CM

## 2018-09-02 DIAGNOSIS — S99911A Unspecified injury of right ankle, initial encounter: Secondary | ICD-10-CM

## 2018-09-02 DIAGNOSIS — M549 Dorsalgia, unspecified: Secondary | ICD-10-CM

## 2018-09-02 NOTE — Patient Instructions (Addendum)
Right ankle:  -Tylenol   -Ice  -Wrap  -Rest    Sickle cell:  Oxycodone 10mg  as needed for pain  Try using voltaren gel to your lower back when this hurts and see if this reduces the amount you need for the oxycodone  Flexeril for back pain: see if this helps because your back pain is most likely muscle-skeletal pain  Back stretches: look some up on how to do - try focusing and stretching your quads/hamstrings because sometimes this can cause back pain     Iron infusion: need to reschedule this. -I'll have your care manager to help you schedule this

## 2018-09-03 ENCOUNTER — Ambulatory Visit: Payer: Medicaid Other | Admitting: Internal Medicine

## 2018-09-03 ENCOUNTER — Encounter: Payer: Self-pay | Admitting: Internal Medicine

## 2018-09-03 MED ORDER — DICLOFENAC SODIUM 1 % EX GEL *I*
CUTANEOUS | 2 refills | Status: DC
Start: 2018-09-03 — End: 2020-10-04

## 2018-09-03 MED ORDER — ANKLE WRAP MISC
0 refills | Status: DC
Start: 2018-09-03 — End: 2019-11-03

## 2018-09-03 MED ORDER — OXYCODONE HCL 10 MG PO TABS *I*
10.0000 mg | ORAL_TABLET | Freq: Two times a day (BID) | ORAL | 0 refills | Status: DC | PRN
Start: 2018-09-03 — End: 2018-09-04

## 2018-09-04 ENCOUNTER — Telehealth: Payer: Self-pay | Admitting: Internal Medicine

## 2018-09-04 ENCOUNTER — Other Ambulatory Visit: Payer: Self-pay | Admitting: Internal Medicine

## 2018-09-04 MED ORDER — OXYCODONE HCL 10 MG PO TABS *I*
10.0000 mg | ORAL_TABLET | ORAL | 0 refills | Status: DC | PRN
Start: 2018-09-11 — End: 2018-09-25

## 2018-09-04 NOTE — Telephone Encounter (Signed)
-  Returned phone call to Dresserracy, Koa's mother. She states that Cameron [name] was previously taking his pain medication as it was prescribed (Q4hrs) and it was not reported to me correctly during the office visit how he was taking it. She states "I always tell them that they have to speak up and be clear about how they are taking their medications"   -He did not know the order was changed until he picked up his meds and read the instructions on how to take it.  (I had called him and left a voicemail and mychart message about the change)  -They leave for vacation next Friday, he will be due for a new refill on Thursday if takes 1 tab Q4 hrs.   I will update a new script for next week but Prabhjot will need to see Dr. Concha NorwayPulcino when he returns for clarification on medication.     Jodi GeraldsKristine M , NP

## 2018-09-04 NOTE — Telephone Encounter (Signed)
Spoke to mom French Ana(Tracy) Who stated Jakeb did not get medication that is needed following his appointment with Barkley BrunsKristine on 12/11. French Anaracy states orders were changed from what Dr.[name] was ordering which were working fine and Marcas is able to manage his pain effectively.  I asked her if he would be ok with getting 3 a day and she said "I guess"    Spoke to PrescottKristine who will follow up    Sangreyracy: [phone]

## 2018-09-04 NOTE — Telephone Encounter (Signed)
Roney Youtz - 16 Prescriptions  Confidential Drug Report  Search Terms: Cameron [name], 1996-09-07   Search Date: 09/04/2018 11:43:50 AM   Searching on behalf of: [id number] - [name]   The Drug Utilization Report below displays all of the controlled substance prescriptions, if any, that your patient has filled in the last twelve months. The information displayed on this report is compiled from pharmacy submissions to the Department, and accurately reflects the information as submitted by the pharmacies.  This report was requested by: Milana Obey   Reference #: [id number]   Others' Prescriptions  Patient Name: Cameron [name] Birth Date: [birth year]   Address: [address] [place], [place] [address] Sex: Male   Rx Written Rx Dispensed Drug Quantity Days Supply Prescriber Name Payment Method Dispenser   08/20/2018 08/21/2018 oxycodone hcl 10 mg tablet  84 14 [name], [name] MD Insurance [facility]   08/07/2018 08/07/2018 oxycodone hcl 10 mg tablet  84 14 [name], [name] MD Insurance [facility]   07/23/2018 07/24/2018 oxycodone hcl 10 mg tablet  84 14 [name], [name] MD Insurance [facility]   07/08/2018 07/11/2018 oxycodone hcl 10 mg tablet  84 14 [name] Insurance [facility]   06/27/2018 06/27/2018 oxycodone hcl 10 mg tablet  84 14 [name] Insurance [facility]   06/11/2018 06/13/2018 oxycodone hcl 10 mg tablet  84 14 [name], [name] MD Insurance [facility]   05/30/2018 05/30/2018 oxycodone hcl 10 mg tablet  84 14 [name] Insurance [facility]   05/14/2018 05/14/2018 oxycodone hcl 10 mg tablet  84 14 [name], [name] Insurance [facility]   04/30/2018 04/30/2018 oxycodone hcl 10 mg tablet  84 14 [name], [name] Insurance [facility]   04/09/2018 04/09/2018 oxycodone hcl 10 mg  tablet  84 14 [name] Insurance [facility]   03/25/2018 03/25/2018 oxycodone hcl 10 mg tablet  80 14 [name] Insurance [facility]   03/12/2018 03/12/2018 oxycodone hcl 10 mg tablet  80 13 [name], [name] MD Insurance [facility]   02/25/2018 02/25/2018 oxycodone hcl 10 mg tablet  80 13 [name], [name] MD Insurance [facility]   02/13/2018 02/13/2018 oxycodone hcl 10 mg tablet  80 13 [name], [name] Insurance [facility]   01/21/2018 01/21/2018 oxycodone hcl 10 mg tablet  80 13 [name], [name] MD Insurance [facility]   01/08/2018 01/08/2018 oxycodone hcl 10 mg tablet  40 7 Lie, Ariadne Insurance [facility]   12/25/2017 12/25/2017 oxycodone hcl 10 mg tablet  40 7 [name], [name] MD Insurance [facility]   12/04/2017 12/05/2017 oxycodone hcl 10 mg tablet  40 7 [name], [name] MD Insurance [facility]   11/25/2017 11/26/2017 oxycodone hcl 10 mg tablet  40 6 [name], [name] MD Insurance [facility]   11/14/2017 11/14/2017 oxycodone hcl 10 mg tablet  40 7 [name], [name] MD Insurance [facility]   11/01/2017 11/02/2017 oxycodone hcl 10 mg tablet  40 7 [name], [name] MD Insurance [facility]   10/23/2017 10/25/2017 oxycodone hcl 10 mg tablet  40  7 [name], Gwenith Spitziffany L MD Medicaid [facility]   10/10/2017 10/12/2017 oxycodone hcl 10 mg tablet  40 7 [name], [name] Medicaid [facility]   09/30/2017 10/02/2017 oxycodone hcl 10 mg tablet  40 7 [name], Gwenith Spitziffany L MD Medicaid [facility]   09/18/2017 09/18/2017 oxycodone hcl 10 mg tablet  40 7 [name], [name] Cash [facility]   09/10/2017 09/10/2017 oxycodone hcl 10 mg tablet  40 7 [name], [name]  Insurance [facility]   * - Drugs marked with an asterisk are compound drugs. If the compound drug is made up of more than one controlled substance, then each controlled substance will be a separate row in the table.

## 2018-09-18 ENCOUNTER — Encounter: Payer: Self-pay | Admitting: Gastroenterology

## 2018-09-25 ENCOUNTER — Other Ambulatory Visit: Payer: Self-pay | Admitting: Internal Medicine

## 2018-09-25 NOTE — Telephone Encounter (Signed)
Tabari Gohl - 79 Prescriptions  Confidential Drug Report  Search Terms: Rykin Wilcher, June 21, 1996   Search Date: 09/25/2018 10:55:19 AM   Searching on behalf of: [id number] - Tiffany L [name]   The Drug Utilization Report below displays all of the controlled substance prescriptions, if any, that your patient has filled in the last twelve months. The information displayed on this report is compiled from pharmacy submissions to the Department, and accurately reflects the information as submitted by the pharmacies.  This report was requested by: Milana Obey   Reference #: [id number]   Others' Prescriptions  Patient Name: Cameron [name] Birth Date: [birth year]   Address: [address] [place], [place] [address] Sex: Male   Rx Written Rx Dispensed Drug Quantity Days Supply Prescriber Name Payment Method Dispenser   09/04/2018 09/11/2018 oxycodone hcl 10 mg tablet  84 14 [name] [facility]   09/03/2018 09/04/2018 oxycodone hcl 10 mg tablet  40 20 [name] Insurance [facility]   08/20/2018 08/21/2018 oxycodone hcl 10 mg tablet  84 14 [name], [name] MD Insurance [facility]   08/07/2018 08/07/2018 oxycodone hcl 10 mg tablet  84 14 [name], [name] MD Insurance [facility]   07/23/2018 07/24/2018 oxycodone hcl 10 mg tablet  84 14 [name], [name] MD Insurance [facility]   07/08/2018 07/11/2018 oxycodone hcl 10 mg tablet  84 14 [name] Insurance [facility]   06/27/2018 06/27/2018 oxycodone hcl 10 mg tablet  84 14 [name] Insurance [facility]   06/11/2018 06/13/2018 oxycodone hcl 10 mg tablet  84 14 [name], [name] MD Insurance [facility]   05/30/2018 05/30/2018 oxycodone hcl 10 mg tablet  84 14 [name] Insurance [facility]   05/14/2018 05/14/2018 oxycodone hcl 10 mg  tablet  84 14 [name], [name] Insurance [facility]   04/30/2018 04/30/2018 oxycodone hcl 10 mg tablet  84 14 [name], [name] Insurance [facility]   04/09/2018 04/09/2018 oxycodone hcl 10 mg tablet  84 14 [name] Insurance [facility]   03/25/2018 03/25/2018 oxycodone hcl 10 mg tablet  80 14 [name] Insurance [facility]   03/12/2018 03/12/2018 oxycodone hcl 10 mg tablet  80 13 [name], [name] MD Insurance [facility]   02/25/2018 02/25/2018 oxycodone hcl 10 mg tablet  80 13 [name], [name] MD Insurance [facility]   02/13/2018 02/13/2018 oxycodone hcl 10 mg tablet  80 13 [name], [name] Insurance [facility]   01/21/2018 01/21/2018 oxycodone hcl 10 mg tablet  80 13 [name], [name] MD Insurance [facility]   01/08/2018 01/08/2018 oxycodone hcl 10 mg tablet  40 7 Lie, Ariadne [facility]   12/25/2017 12/25/2017 oxycodone hcl 10 mg tablet  40 7 [name], [name] MD Insurance [facility]   12/04/2017 12/05/2017 oxycodone hcl 10 mg tablet  40 7 [name], [name] MD Insurance [facility]   11/25/2017 11/26/2017 oxycodone hcl 10 mg tablet  40 6 [name], [name] MD Insurance [facility]   11/14/2017 11/14/2017 oxycodone hcl 10 mg tablet  40 7  Benay Pillow MD Insurance [facility]   11/01/2017 11/02/2017 oxycodone hcl 10 mg tablet  40 7 [name], [name] MD Insurance [facility]   10/23/2017 10/25/2017 oxycodone hcl 10 mg tablet  40 7 [name], [name] MD Medicaid [facility]   10/10/2017 10/12/2017 oxycodone hcl 10 mg tablet  40 7 [name], [name] MD Medicaid [facility]   09/30/2017 10/02/2017 oxycodone hcl 10 mg tablet  40 7 [name], Tiffany L  MD Medicaid [name]   * - Drugs marked with an asterisk are compound drugs. If the compound drug is made up of more than one controlled substance, then each controlled substance will be a separate row in the table.

## 2018-09-26 MED ORDER — OXYCODONE HCL 10 MG PO TABS *I*
10.0000 mg | ORAL_TABLET | ORAL | 0 refills | Status: DC | PRN
Start: 2018-09-26 — End: 2018-10-14

## 2018-09-30 ENCOUNTER — Encounter: Payer: Self-pay | Admitting: Primary Care

## 2018-09-30 ENCOUNTER — Ambulatory Visit: Payer: Medicaid Other | Admitting: Primary Care

## 2018-09-30 ENCOUNTER — Telehealth: Payer: Self-pay | Admitting: Primary Care

## 2018-09-30 NOTE — Progress Notes (Unsigned)
Cameron [name] - 31 Prescriptions  Confidential Drug Report  Search Terms: [name], [birth year]   Search Date: 09/30/2018 11:01:46 AM   Searching on behalf of: [id number] - Cameron [name]   The Drug Utilization Report below displays all of the controlled substance prescriptions, if any, that your patient has filled in the last twelve months. The information displayed on this report is compiled from pharmacy submissions to the Department, and accurately reflects the information as submitted by the pharmacies.  This report was requested by: Marylene Land Darbi Chandran   Reference #: [id number]   Others' Prescriptions  Patient Name: Cameron [name] Birth Date: [birth year]   Address: [address] [place], [place] [address] Sex: Male   Rx Written Rx Dispensed Drug Quantity Days Supply Prescriber Name Payment Method Dispenser   09/26/2018 09/29/2018 oxycodone hcl 10 mg tablet  84 14 [name], [name] Cash [facility]   09/04/2018 09/11/2018 oxycodone hcl 10 mg tablet  84 14 [name] Insurance [facility]   09/03/2018 09/04/2018 oxycodone hcl 10 mg tablet  40 20 [name] Insurance [facility]   08/20/2018 08/21/2018 oxycodone hcl 10 mg tablet  84 14 [name], [name] MD Insurance [facility]   08/07/2018 08/07/2018 oxycodone hcl 10 mg tablet  84 14 [name], [name] MD Insurance [facility]   07/23/2018 07/24/2018 oxycodone hcl 10 mg tablet  84 14 [name], [name] MD Insurance [facility]   07/08/2018 07/11/2018 oxycodone hcl 10 mg tablet  84 14 [name] Insurance [facility]   06/27/2018 06/27/2018 oxycodone hcl 10 mg tablet  84 14 [name] Insurance [facility]   06/11/2018 06/13/2018 oxycodone hcl 10 mg tablet  84 14 [name], [name] MD Insurance [facility]   05/30/2018 05/30/2018 oxycodone hcl 10 mg  tablet  84 14 [name] Insurance [facility]   05/14/2018 05/14/2018 oxycodone hcl 10 mg tablet  84 14 [name], [name] Insurance [facility]   04/30/2018 04/30/2018 oxycodone hcl 10 mg tablet  84 14 [name], [name] Insurance [facility]   04/09/2018 04/09/2018 oxycodone hcl 10 mg tablet  84 14 [name] Insurance [facility]   03/25/2018 03/25/2018 oxycodone hcl 10 mg tablet  80 14 [name] Insurance [facility]   03/12/2018 03/12/2018 oxycodone hcl 10 mg tablet  80 13 [name], [name] MD Insurance [facility]   02/25/2018 02/25/2018 oxycodone hcl 10 mg tablet  80 13 [name], [name] MD Insurance [facility]   02/13/2018 02/13/2018 oxycodone hcl 10 mg tablet  80 13 [name], [name] MD Insurance [facility]   01/21/2018 01/21/2018 oxycodone hcl 10 mg tablet  80 13 [name], [name] MD Insurance [facility]   01/08/2018 01/08/2018 oxycodone hcl 10 mg tablet  40 7 Lie, Ariadne Insurance [facility]   12/25/2017 12/25/2017 oxycodone hcl 10 mg tablet  40 7 [name], [name] MD Insurance [facility]   12/04/2017 12/05/2017 oxycodone hcl 10 mg tablet  40 7 [name], [name] MD Insurance [facility]   11/25/2017 11/26/2017 oxycodone hcl 10 mg tablet  40 6  Benay Pillow MD Insurance [facility]   11/14/2017 11/14/2017 oxycodone hcl 10 mg tablet  40 7 [name], [name] MD Insurance [facility]   11/01/2017 11/02/2017 oxycodone hcl 10 mg tablet  40 7 [name], [name] MD Insurance [facility]   10/23/2017 10/25/2017 oxycodone hcl 10 mg tablet  40 7 [name], [name] MD Medicaid [facility]   10/10/2017 10/12/2017 oxycodone hcl 10 mg tablet  40 7 [name], [name] MD Medicaid [facility]   09/30/2017 10/02/2017 oxycodone hcl 10 mg tablet  40 7 [name], [name] Medicaid [name]   * - Drugs marked with an asterisk are compound drugs. If the compound drug is made up of more than one controlled substance, then each controlled substance will be a separate row in the table.

## 2018-09-30 NOTE — Telephone Encounter (Signed)
Make sure pt has made an iron infusion appointment

## 2018-10-01 ENCOUNTER — Encounter: Payer: Self-pay | Admitting: Gastroenterology

## 2018-10-01 NOTE — Telephone Encounter (Signed)
Called and l/m for pt  Requested a call back to the office to discuss scheduling at [facility] infusion   Number provided

## 2018-10-06 NOTE — Telephone Encounter (Signed)
Called and l/m for pt  Requested a call back to the office for updates on iron infusions  Number provided

## 2018-10-09 ENCOUNTER — Other Ambulatory Visit: Payer: Self-pay | Admitting: Primary Care

## 2018-10-09 NOTE — Telephone Encounter (Signed)
Kristine Tignor - 97 Prescriptions  Confidential Drug Report  Search Terms: Skippy Rohn, 1996-08-15   Search Date: 10/09/2018 09:11:01 AM   Searching on behalf of: [id number] - [name]   The Drug Utilization Report below displays all of the controlled substance prescriptions, if any, that your patient has filled in the last twelve months. The information displayed on this report is compiled from pharmacy submissions to the Department, and accurately reflects the information as submitted by the pharmacies.  This report was requested by: [name]   Reference #: [id number]   Others' Prescriptions  Patient Name: Cameron [name] Birth Date: September 06, 1996   Address: [address] [place], [place] [address] Sex: Male   Rx Written Rx Dispensed Drug Quantity Days Supply Prescriber Name Payment Method Dispenser   09/26/2018 09/29/2018 oxycodone hcl 10 mg tablet  84 14 [name], [name] Cash [facility]   09/04/2018 09/11/2018 oxycodone hcl 10 mg tablet  84 14 [name] Insurance [facility]   09/03/2018 09/04/2018 oxycodone hcl 10 mg tablet  40 20 [name] [facility]   08/20/2018 08/21/2018 oxycodone hcl 10 mg tablet  84 14 [name], [name] MD Insurance [facility]   08/07/2018 08/07/2018 oxycodone hcl 10 mg tablet  84 14 [name], [name] MD Insurance [facility]   07/23/2018 07/24/2018 oxycodone hcl 10 mg tablet  84 14 [name], [name] MD Insurance [facility]   07/08/2018 07/11/2018 oxycodone hcl 10 mg tablet  84 14 [name] [facility]   06/27/2018 06/27/2018 oxycodone hcl 10 mg tablet  84 14 [name] [facility]   06/11/2018 06/13/2018 oxycodone hcl 10 mg tablet  84 14 [name], [name] MD Insurance [facility]   05/30/2018 05/30/2018 oxycodone hcl 10 mg  tablet  84 14 [name] [facility]   05/14/2018 05/14/2018 oxycodone hcl 10 mg tablet  84 14 [name], [name] Insurance [facility]   04/30/2018 04/30/2018 oxycodone hcl 10 mg tablet  84 14 [name], [name] Insurance [facility]   04/09/2018 04/09/2018 oxycodone hcl 10 mg tablet  84 14 [name] [facility]   03/25/2018 03/25/2018 oxycodone hcl 10 mg tablet  80 14 [name] [facility]   03/12/2018 03/12/2018 oxycodone hcl 10 mg tablet  80 13 [name], [name] MD Insurance [facility]   02/25/2018 02/25/2018 oxycodone hcl 10 mg tablet  80 13 [name], [name] MD Insurance [facility]   02/13/2018 02/13/2018 oxycodone hcl 10 mg tablet  80 13 [name], [name] MD [facility]   01/21/2018 01/21/2018 oxycodone hcl 10 mg tablet  80 13 [name], [name] MD [facility]   01/08/2018 01/08/2018 oxycodone hcl 10 mg tablet  40 7 Lie, Ariadne [facility]   12/25/2017 12/25/2017 oxycodone hcl 10 mg tablet  40 7 [name], [name] MD Insurance [facility]   12/04/2017 12/05/2017 oxycodone hcl 10 mg tablet  40 7 [name], [name] MD Insurance [facility]   11/25/2017 11/26/2017 oxycodone hcl 10 mg tablet  40 6  Benay Pillow MD [facility]   11/14/2017 11/14/2017 oxycodone hcl 10 mg tablet  40 7 [name], [name] MD Insurance [facility]   11/01/2017 11/02/2017 oxycodone hcl 10 mg tablet  40 7 [name], [name] MD Insurance [facility]   10/23/2017 10/25/2017 oxycodone hcl 10 mg tablet  40 7 [name], [name] MD Medicaid [facility]   10/10/2017 10/12/2017 oxycodone hcl 10 mg tablet  40 7 [name], Tiffany  L MD Medicaid [facility]

## 2018-10-10 NOTE — Telephone Encounter (Signed)
I texted him, provided him with the infusion  contact informmation and  requested a text back when appointment is scheduled.

## 2018-10-10 NOTE — Telephone Encounter (Signed)
Attempted to reach pt   Sent to voicemail    Attempted to reach his mother  Number not working    Will route to [employer]

## 2018-10-14 ENCOUNTER — Other Ambulatory Visit: Payer: Self-pay | Admitting: Primary Care

## 2018-10-14 NOTE — Telephone Encounter (Signed)
Cameron [name] - 18 Prescriptions  Confidential Drug Report  Search Terms: Abas Sgro, 1995-12-24   Search Date: 10/14/2018 10:34:00 AM   Searching on behalf of: [id number] - Karle Barr   The Drug Utilization Report below displays all of the controlled substance prescriptions, if any, that your patient has filled in the last twelve months. The information displayed on this report is compiled from pharmacy submissions to the Department, and accurately reflects the information as submitted by the pharmacies.  This report was requested by: Alvira Philips   Reference #: [id number]   Others' Prescriptions  Patient Name: Cameron [name] Birth Date: Oct 03, 1995   Address: [address] [place], [place] [address] Sex: Male   Rx Written Rx Dispensed Drug Quantity Days Supply Prescriber Name Payment Method Dispenser   09/26/2018 09/29/2018 oxycodone hcl 10 mg tablet  84 14 [name], [name] [facility]   09/04/2018 09/11/2018 oxycodone hcl 10 mg tablet  84 14 [name] [facility]   09/03/2018 09/04/2018 oxycodone hcl 10 mg tablet  40 20 [name] [facility]   08/20/2018 08/21/2018 oxycodone hcl 10 mg tablet  84 14 [name], [name] MD [facility]   08/07/2018 08/07/2018 oxycodone hcl 10 mg tablet  84 14 [name], [name] MD Insurance [facility]   07/23/2018 07/24/2018 oxycodone hcl 10 mg tablet  84 14 [name], [name] MD [facility]   07/08/2018 07/11/2018 oxycodone hcl 10 mg tablet  84 14 [name] [facility]   06/27/2018 06/27/2018 oxycodone hcl 10 mg tablet  84 14 [name] [facility]   06/11/2018 06/13/2018 oxycodone hcl 10 mg tablet  84 14 [name], [name] MD Insurance [facility]   05/30/2018 05/30/2018 oxycodone hcl 10 mg tablet  84  14 [name] [facility]   05/14/2018 05/14/2018 oxycodone hcl 10 mg tablet  84 14 [name], [name] Insurance [facility]   04/30/2018 04/30/2018 oxycodone hcl 10 mg tablet  84 14 [name], [name] [facility]   04/09/2018 04/09/2018 oxycodone hcl 10 mg tablet  84 14 [name] [facility]   03/25/2018 03/25/2018 oxycodone hcl 10 mg tablet  80 14 [name] [facility]   03/12/2018 03/12/2018 oxycodone hcl 10 mg tablet  80 13 [name], [name] MD Insurance [facility]   02/25/2018 02/25/2018 oxycodone hcl 10 mg tablet  80 13 [name], [name] MD Insurance [facility]   02/13/2018 02/13/2018 oxycodone hcl 10 mg tablet  80 13 [name], [name] MD [facility]   01/21/2018 01/21/2018 oxycodone hcl 10 mg tablet  80 13 [name], [name] MD [facility]   01/08/2018 01/08/2018 oxycodone hcl 10 mg tablet  40 7 Lie, Ariadne [facility]   12/25/2017 12/25/2017 oxycodone hcl 10 mg tablet  40 7 [name], [name] MD [facility]   12/04/2017 12/05/2017 oxycodone hcl 10 mg tablet  40 7 [name], [name] MD [facility]   11/25/2017 11/26/2017 oxycodone hcl 10 mg tablet  40 6  Benay Pillow MD [facility]   11/14/2017 11/14/2017 oxycodone hcl 10 mg tablet  40 7 [name], [name] MD Insurance [facility]   11/01/2017 11/02/2017 oxycodone hcl 10 mg tablet  40 7 [name], [name] MD Insurance [facility]   10/23/2017 10/25/2017 oxycodone hcl 10 mg tablet  40 7 [name], [name] Medicaid [name]   * - Drugs marked with an asterisk are compound drugs. If the compound drug is made  up of more than one controlled substance, then each controlled substance will be a separate row in the table.

## 2018-10-14 NOTE — Telephone Encounter (Addendum)
Patient states patient requested this medication last week an it was denied due to it being too soon. Patient is currently out of medication and has requested this get sent in today. Writer paged provider.

## 2018-10-15 ENCOUNTER — Other Ambulatory Visit: Payer: Self-pay | Admitting: Primary Care

## 2018-10-15 NOTE — Telephone Encounter (Signed)
Duplicate Request.

## 2018-10-15 NOTE — Telephone Encounter (Signed)
Duplicate request. Pended in an additional encounter.

## 2018-10-16 MED ORDER — OXYCODONE HCL 10 MG PO TABS *I*
10.0000 mg | ORAL_TABLET | ORAL | 0 refills | Status: DC | PRN
Start: 2018-10-16 — End: 2018-10-30

## 2018-10-16 NOTE — Telephone Encounter (Signed)
rx refill was to be discussed at f/u visit which was cancelled - requires clarification on dosing prior to any further refills and will clarify with patient that visit required for refill which will be provided monthly only

## 2018-10-16 NOTE — Telephone Encounter (Signed)
Patient's mother called [facility]. Caller expressed frustration that patient's medication has not been sent in. Caller stated that she is trying to teach her son how to be independent and do things for himself but that he has now gone 3 days without medications. Caller stated "what is paging you have paged her already are you going to have someone send this medication in today".

## 2018-10-16 NOTE — Telephone Encounter (Signed)
Pt out of medication  Provider paged.

## 2018-10-19 NOTE — Telephone Encounter (Signed)
This encounter was created in error - please disregard.

## 2018-10-22 ENCOUNTER — Encounter: Payer: Self-pay | Admitting: Primary Care

## 2018-10-22 ENCOUNTER — Ambulatory Visit: Payer: Medicaid Other | Attending: Primary Care | Admitting: Primary Care

## 2018-10-22 VITALS — BP 126/60 | HR 74 | Ht 72.0 in | Wt 200.2 lb

## 2018-10-22 DIAGNOSIS — K5190 Ulcerative colitis, unspecified, without complications: Secondary | ICD-10-CM

## 2018-10-22 LAB — TIBC
Iron: 22 ug/dL — ABNORMAL LOW (ref 45–170)
TIBC: 429 ug/dL (ref 250–450)
Transferrin Saturation: 5 % — ABNORMAL LOW (ref 20–55)

## 2018-10-22 LAB — RBC MORPHOLOGY

## 2018-10-22 LAB — CBC AND DIFFERENTIAL
Baso # K/uL: 0 10*3/uL (ref 0.0–0.1)
Basophil %: 0.6 %
Eos # K/uL: 0.5 10*3/uL (ref 0.0–0.5)
Eosinophil %: 7.3 %
Hematocrit: 19 % — CL (ref 40–51)
Hemoglobin: 5.4 g/dL — ABNORMAL LOW (ref 13.7–17.5)
IMM Granulocytes #: 0 10*3/uL
IMM Granulocytes: 0.3 %
Lymph # K/uL: 1.7 10*3/uL (ref 1.3–3.6)
Lymphocyte %: 24.7 %
MCH: 17 pg/cell — ABNORMAL LOW (ref 26–32)
MCHC: 28 g/dL — ABNORMAL LOW (ref 32–37)
MCV: 62 fL — ABNORMAL LOW (ref 79–92)
Mono # K/uL: 0.4 10*3/uL (ref 0.3–0.8)
Monocyte %: 6.1 %
Neut # K/uL: 4.3 10*3/uL (ref 1.8–5.4)
Nucl RBC # K/uL: 0 10*3/uL (ref 0.0–0.0)
Nucl RBC %: 0 /100 WBC (ref 0.0–0.2)
Platelets: 269 10*3/uL (ref 150–330)
RBC: 3.2 MIL/uL — ABNORMAL LOW (ref 4.6–6.1)
RDW: 28.3 % — ABNORMAL HIGH (ref 11.6–14.4)
Seg Neut %: 61 %
WBC: 7 10*3/uL (ref 4.2–9.1)

## 2018-10-22 LAB — FERRITIN: Ferritin: 9 ng/mL — ABNORMAL LOW (ref 20–250)

## 2018-10-22 NOTE — Progress Notes (Signed)
[facility]  [address]  [place] [address]  Phone: [phone]  Fax: [phone]    REASON FOR VISIT     Sickle Cell VOC    PRIMARY DIAGNOSIS   Sickle Cell disease, type  HgSS    SUBJECTIVE     Back pain:  Feels very consistent with SCD related pain - related to exertion but not with a specific movement - more with dehydration or acute illness - feels different than muscular strain or exertion and has worsened with deconditioning   Currently use of oxy IR for pain management is as follows per Gerik report today:  On good days taking 1-3 tablets over course of day (this is about 4 days per week)  On bad days taking 3 tab per day consistently (this is about 3 days per week)    Does not work until about 1 pm so needs appointments to be in the [employer]    Current home management:  Non pharmacologic interventions:  Non opioid pain modifying medications:  Short acting Opioid pain medications:oxycodone  10 mg Q 4 hr  Long acting Opioid pain medications: None  Current Pain score:   Pain    10/22/18 1556   PainSc:   0 - No pain   Emergency Room visits for this event: 0  Hospitalizations for this event: 0  Current disease modifying therapy: none      Ulcerative colitis:  Taking asacol 3 tab BID about 3 days a week and usually remainder of week recalls to take it at least daily  Has noted persistent daily stool, formed  No abdominal cramping with eating meals or urgency  Energy improved    Secondary iron deficiency:  Failed oral replacement  Has not yet scheduled infusion of ferrous sulfate  Finds fatigue most debilitating current sx of his chronic illnesses    Medications Reviewed and changes     Current Outpatient Medications:     oxyCODONE (ROXICODONE) 10 MG immediate release tablet, Take 1 tablet (10 mg total) by mouth every 4 hours as needed for Pain Max daily dose: 60 mg, Disp: 84 tablet, Rfl: 0    diclofenac (VOLTAREN) 1 % gel, Apply 4 g to affected area 4 times daily., Disp: 100 g, Rfl:  2    Elastic Bandages & Supports (ANKLE WRAP) MISC, Use PRN for right ankle., Disp: 2 each, Rfl: 0    folic acid (FOLVITE) 1 MG tablet, Take 1 tablet (1 mg total) by mouth daily, Disp: 30 tablet, Rfl: 5    mesalamine (ASACOL HD) 800 MG tablet, Take 3 tablets (2,400 mg total) by mouth 2 times daily, Disp: 180 tablet, Rfl: 2    cholecalciferol (VITAMIN D) 1000 UNIT capsule, Take 2 capsules (2,000 Units total) by mouth daily, Disp: 60 capsule, Rfl: 5    ferrous sulfate 325 (65 FE) MG tablet, Take 1 tablet (325 mg total) by mouth 2 times daily (with meals), Disp: 100 tablet, Rfl: 3    acetaminophen (TYLENOL) 500 mg tablet, Take 500 mg by mouth every 6 hours as needed, Disp: , Rfl:     cyclobenzaprine (FLEXERIL) 10 MG tablet, Take 1 tablet (10 mg total) by mouth 3 times daily as needed for Muscle spasms (Patient not taking: Reported on 09/02/2018), Disp: 45 tablet, Rfl: 0    naloxone (NARCAN) 4 mg/0.1 mL nasal spray, Instill 1 spray in 1 nostril once for opioid reversal. Repeat in alternating nostrils with new package every 2-3 minutes until response,  Disp: 2 each, Rfl: 5  Review of Systems   Constitutional: Positive for activity change and fatigue. Negative for appetite change and fever.   Respiratory: Negative for cough, chest tightness and shortness of breath.    Gastrointestinal: Negative for abdominal distention, abdominal pain, blood in stool, constipation, nausea and vomiting.   Musculoskeletal: Positive for back pain.   Neurological: Negative for dizziness.   Psychiatric/Behavioral: Negative for dysphoric mood and sleep disturbance.     OBJECTIVE     BP 126/60    Pulse 74    Ht 1.829 m (6')    Wt 90.8 kg (200 lb 3.2 oz)    SpO2 99%    BMI 27.15 kg/m   Physical Exam   Constitutional: He appears well-developed and well-nourished. No distress.   Psychiatric: He has a normal mood and affect. His behavior is normal. Judgment and thought content normal.           Lab results: 05/14/18  1723   WBC 5.4    Hemoglobin 5.3*   Hematocrit 20*   RBC 3.2*   Platelets 275         ASSESSMENT / DIAGNOSIS     Brack was seen today for sickle cell disease with hpfh with codiagnosis of UC     Diagnoses and all orders for this visit:    Sickle cell disease:  No indication in past to initiate HU due to high HgF but with persistent increased need for opiate pain management discussed possible initiation of HU in near future with goal of optimizing dosing by the time [place] - [place] amenable to plan but first will priorize iron infusions given impact of fatigue on QOL biggest  Current use of oxy IR tablets would total need of 84 tab per month (28 days) if calling earlier than 28 days indicates worsening sx    Ulcerative colitis  Continue asacol, improving adherence with regimen  Unfortunately no ER formulation of this medication - goal for at least daily dosing 7 days a week and 4 days BID dosing with alarm on phone - reviewed could be taken anytime of day    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  In addition Blood type O+ with antibodies anti-fya,jkb,lea  Outpatient Pain Regimen:  Long acting: none  Short acting; Oxy IR 10mg  Q4 hrs prn pain  Home Based titration regimen: none, needs assessment due to uncontrolled UC0  Adjunct therapies: none, holding NSAID due to ongoing LGI bleeding  Other: none    [facility]    ED/Infusion Center care plan recommendations:  Toradol 30 mg IV Q6 hrs ATC  NS 2 L IV bolus  Transfuse for HcT <18  Hydromorphone 2 mg IV Q30 min prn pain score>5  Recommend hospitalization is pain score remains >5 after 3 doses of Hydromorphone 2 mg IV    Labs  on arrival: CBC, retic, CHEM14  CXR if complaints of chest pain to evaluate for infiltrate consistent with acute chest syndrome  Assess for need for bronchodilator if complaints of chest pain    Inpatient care plan recommendations:  Toradol 30 mg IV Q6 hrs ATC  NS bolus until euvolemic followed by D51/2NS at 125 cc/hr until tolerating PO well  PCA: Start with demand only Hydromorophone PCA 1 mg Q20 min with lockout 12 mg over 4 hrs Recommend titration of pain regimen until achieving pain score <5     Prior to discharge recommend 24 hours of PO pain medications to assess for sx of withdrawal with transition and continued pain control to prevent readmission    Transfuse if HcT< 18 and consider GI consult as patient's most recent cause for profound anemia is due to UC    Venofer orders in Dr Concha NorwayPulcino    There are no Patient Instructions on file for this visit.    No follow-ups on file.  --Patient instructed to call if symptoms are not improving or worsening      Electronically signed by Benay PillowIFFANY L Omaria Plunk, MD 10/22/2018   3:58 PM  [facility], Phone: [phone]

## 2018-10-26 ENCOUNTER — Encounter: Payer: Self-pay | Admitting: Primary Care

## 2018-10-27 ENCOUNTER — Encounter: Payer: Self-pay | Admitting: Primary Care

## 2018-10-27 ENCOUNTER — Other Ambulatory Visit: Payer: Medicaid Other | Admitting: Primary Care

## 2018-10-28 ENCOUNTER — Ambulatory Visit: Payer: Medicaid Other | Attending: Primary Care

## 2018-10-28 VITALS — BP 110/53 | HR 86 | Temp 97.5°F | Wt 194.9 lb

## 2018-10-28 DIAGNOSIS — D508 Other iron deficiency anemias: Secondary | ICD-10-CM | POA: Insufficient documentation

## 2018-10-28 DIAGNOSIS — K5100 Ulcerative (chronic) pancolitis without complications: Secondary | ICD-10-CM

## 2018-10-28 DIAGNOSIS — E611 Iron deficiency: Secondary | ICD-10-CM

## 2018-10-28 MED ORDER — SODIUM CHLORIDE 0.9 % IV SOLN WRAPPED *I*
100.0000 mg | Freq: Once | INTRAVENOUS | Status: AC
Start: 2018-10-28 — End: 2018-10-28
  Administered 2018-10-28: 100 mg via INTRAVENOUS
  Filled 2018-10-28: qty 5

## 2018-10-28 MED ORDER — SODIUM CHLORIDE 0.9 % IV SOLN WRAPPED *I*
30.0000 mL/h | Status: DC | PRN
Start: 2018-10-28 — End: 2018-10-28
  Administered 2018-10-28: 30 mL/h via INTRAVENOUS

## 2018-10-28 NOTE — Progress Notes (Signed)
Cameron [name] here for # 1 of (possibly) 4 Venofer. PIV patent with positive blood return pre and post infusion.  Pt tolerated infusion over 30 minutes and was monitored for 30 minutes post infusion. VSS before and after infusion.  Orders written for 3 additional venofer two weeks apart but not signed - Clinical research associate unable to confirm schedule with MD.  Pt stated will call MD and confirm and will cancel if necessary.  Pt has next scheduled appointment and is agreeable to plan - returning in 2 weeks.  Pt declined AVS.

## 2018-10-30 ENCOUNTER — Other Ambulatory Visit: Payer: Self-pay | Admitting: Internal Medicine

## 2018-10-30 NOTE — Telephone Encounter (Signed)
Brenton Hoaglin - 81 Prescriptions  Confidential Drug Report  Search Terms: Canuto Patyk, Dec 29, 1995   Search Date: 10/30/2018 12:56:40 PM   Searching on behalf of: [id number] - Karle Barr   The Drug Utilization Report below displays all of the controlled substance prescriptions, if any, that your patient has filled in the last twelve months. The information displayed on this report is compiled from pharmacy submissions to the Department, and accurately reflects the information as submitted by the pharmacies.  This report was requested by: Alvira Philips   Reference #: [id number]   Others' Prescriptions  Patient Name: Cameron [name] Birth Date: [birth year]   Address: [address] [place], [place] [address] Sex: Male   Rx Written Rx Dispensed Drug Quantity Days Supply Prescriber Name Payment Method Dispenser   10/16/2018 10/16/2018 oxycodone hcl 10 mg tablet  84 14 Watchung, Barkley Bruns M [facility]   09/26/2018 09/29/2018 oxycodone hcl 10 mg tablet  84 14 [name], [name] Cash [facility]   09/04/2018 09/11/2018 oxycodone hcl 10 mg tablet  84 14 [name] [facility]   09/03/2018 09/04/2018 oxycodone hcl 10 mg tablet  40 20 [name] Insurance [facility]   08/20/2018 08/21/2018 oxycodone hcl 10 mg tablet  84 14 [name], [name] MD Insurance [facility]   08/07/2018 08/07/2018 oxycodone hcl 10 mg tablet  84 14 [name], [name] MD Insurance [facility]   07/23/2018 07/24/2018 oxycodone hcl 10 mg tablet  84 14 [name], [name] Insurance [facility]   07/08/2018 07/11/2018 oxycodone hcl 10 mg tablet  84 14 [name] [facility]   06/27/2018 06/27/2018 oxycodone hcl 10 mg tablet  84 14 [name] [facility]   06/11/2018 06/13/2018 oxycodone hcl 10 mg tablet  84  14 [name], [name] MD Insurance [facility]   05/30/2018 05/30/2018 oxycodone hcl 10 mg tablet  84 14 [name] [facility]   05/14/2018 05/14/2018 oxycodone hcl 10 mg tablet  84 14 [name], [name] Insurance [facility]   04/30/2018 04/30/2018 oxycodone hcl 10 mg tablet  84 14 [name], [name] [facility]   04/09/2018 04/09/2018 oxycodone hcl 10 mg tablet  84 14 [name] [facility]   03/25/2018 03/25/2018 oxycodone hcl 10 mg tablet  80 14 [name] [facility]   03/12/2018 03/12/2018 oxycodone hcl 10 mg tablet  80 13 [name], [name] MD Insurance [facility]   02/25/2018 02/25/2018 oxycodone hcl 10 mg tablet  80 13 [name], [name] MD [facility]   02/13/2018 02/13/2018 oxycodone hcl 10 mg tablet  80 13 [name], [name] MD Insurance [facility]   01/21/2018 01/21/2018 oxycodone hcl 10 mg tablet  80 13 [name], [name] MD Insurance [facility]   01/08/2018 01/08/2018 oxycodone hcl 10 mg tablet  40 7 Lie, Ariadne Insurance [facility]   12/25/2017 12/25/2017 oxycodone hcl 10 mg tablet  40 7 [name], [name] MD Insurance [facility]   12/04/2017 12/05/2017 oxycodone hcl 10 mg tablet  40 7 [name],  [name] MD Insurance [facility]   11/25/2017 11/26/2017 oxycodone hcl 10 mg tablet  40 6 [name], [name] MD Insurance [facility]   11/14/2017 11/14/2017 oxycodone hcl 10 mg tablet  40 7 [name], [name] MD Insurance [facility]   11/01/2017 11/02/2017 oxycodone hcl 10 mg tablet  40 7 [name], [name] Insurance [facility]   * - Drugs marked with an asterisk are compound drugs. If the compound drug is  made up of more than one controlled substance, then each controlled substance will be a separate row in the table.

## 2018-10-31 ENCOUNTER — Other Ambulatory Visit: Payer: Self-pay | Admitting: Internal Medicine

## 2018-10-31 MED ORDER — OXYCODONE HCL 10 MG PO TABS *I*
10.0000 mg | ORAL_TABLET | ORAL | 0 refills | Status: DC | PRN
Start: 2018-10-31 — End: 2018-11-14

## 2018-10-31 NOTE — Telephone Encounter (Addendum)
Writer spoke to patient. Patient is currently out of medication and requests the provider sends it in so patient does not go without over the weekend. Provider paged.

## 2018-10-31 NOTE — Telephone Encounter (Signed)
Cameron [name] - 96 Prescriptions  Confidential Drug Report  Search Terms: Cameron [name], 1996-05-15   Search Date: 10/31/2018 08:55:02 AM   Searching on behalf of: [id number] - Tiffany L [name]   The Drug Utilization Report below displays all of the controlled substance prescriptions, if any, that your patient has filled in the last twelve months. The information displayed on this report is compiled from pharmacy submissions to the Department, and accurately reflects the information as submitted by the pharmacies.  This report was requested by: Milana Obey   Reference #: [id number]   Others' Prescriptions  Patient Name: Cameron [name] Birth Date: 01/19/1996   Address: [address] [place], [place] [address] Sex: Male   Rx Written Rx Dispensed Drug Quantity Days Supply Prescriber Name Payment Method Dispenser   10/16/2018 10/16/2018 oxycodone hcl 10 mg tablet  84 14 [place], Barkley Bruns M [facility]   09/26/2018 09/29/2018 oxycodone hcl 10 mg tablet  84 14 [name], [name] Cash [facility]   09/04/2018 09/11/2018 oxycodone hcl 10 mg tablet  84 14 [name] Insurance [facility]   09/03/2018 09/04/2018 oxycodone hcl 10 mg tablet  40 20 [name] Insurance [facility]   08/20/2018 08/21/2018 oxycodone hcl 10 mg tablet  84 14 [name], [name] MD Insurance [facility]   08/07/2018 08/07/2018 oxycodone hcl 10 mg tablet  84 14 [name], [name] MD Insurance [facility]   07/23/2018 07/24/2018 oxycodone hcl 10 mg tablet  84 14 [name], [name] Insurance [facility]   07/08/2018 07/11/2018 oxycodone hcl 10 mg tablet  84 14 [name] Insurance [facility]   06/27/2018 06/27/2018 oxycodone hcl 10 mg tablet  84 14 [name] [facility]   06/11/2018 06/13/2018 oxycodone hcl 10 mg tablet   84 14 [name], [name] MD Insurance [facility]   05/30/2018 05/30/2018 oxycodone hcl 10 mg tablet  84 14 [name] Insurance [facility]   05/14/2018 05/14/2018 oxycodone hcl 10 mg tablet  84 14 [name], [name] Insurance [facility]   04/30/2018 04/30/2018 oxycodone hcl 10 mg tablet  84 14 [name], [name] Insurance [facility]   04/09/2018 04/09/2018 oxycodone hcl 10 mg tablet  84 14 [name] Insurance [facility]   03/25/2018 03/25/2018 oxycodone hcl 10 mg tablet  80 14 [name] Insurance [facility]   03/12/2018 03/12/2018 oxycodone hcl 10 mg tablet  80 13 [name], [name] MD Insurance [facility]   02/25/2018 02/25/2018 oxycodone hcl 10 mg tablet  80 13 [name], [name] MD Insurance [facility]   02/13/2018 02/13/2018 oxycodone hcl 10 mg tablet  80 13 [name], [name] MD Insurance [facility]   01/21/2018 01/21/2018 oxycodone hcl 10 mg tablet  80 13 [name], [name] MD Insurance [facility]   01/08/2018 01/08/2018 oxycodone hcl 10 mg tablet  40 7 Lie, Ariadne Insurance [facility]   12/25/2017 12/25/2017 oxycodone hcl 10 mg tablet  40 7 [name], [name] MD Insurance [facility]   12/04/2017 12/05/2017 oxycodone hcl 10 mg tablet  40 7 [name],  [name] MD Insurance [facility]   11/25/2017 11/26/2017 oxycodone hcl 10 mg tablet  40 6 [name], [name] MD Insurance [facility]   11/14/2017 11/14/2017 oxycodone hcl 10 mg tablet  40 7 [name], [name] MD Insurance [facility]   11/01/2017 11/02/2017 oxycodone hcl 10 mg tablet  40 7 [name], [name] Insurance [facility] [place] [facility]   * - Drugs marked with an asterisk are compound drugs. If the compound drug is  made up of more than one controlled substance, then each controlled substance will be a separate row in the table.

## 2018-10-31 NOTE — Telephone Encounter (Signed)
Please notify patient that refill completed to listed pharmacy

## 2018-10-31 NOTE — Telephone Encounter (Signed)
Writer spoke to Newman, patients mother. French Ana states she is upset and concerned the patient is currently out of medication. French Ana reports the patient is off of work today and is available to speak to a nurse or provider if anything is needed to completed prescription order. French Ana requested to receive a confirmation call when order is completed and forwarded to the pharmacy.

## 2018-11-01 NOTE — Telephone Encounter (Signed)
Contacted by patient "out of meds for sickle cell"    On calling back VM answered.  I left message instructing patient to call again if he had any ongoing medical emergency and reminding him that weekend on-call physician cannot do routine medication (pain or other) refills.  Given office number to call with further questions [phone]    Patient did not call back.    Jacki Cones, MD

## 2018-11-03 NOTE — Telephone Encounter (Signed)
Cameron BachelorLucius Durfey - 16- 25 Prescriptions  Confidential Drug Report  Search Terms: Cameron BachelorLucius Liming, [birth year]   Search Date: 11/03/2018 09:03:10 AM   Searching on behalf of: [id number] - Roselind RilyKristine Reinhardt   The Drug Utilization Report below displays all of the controlled substance prescriptions, if any, that your patient has filled in the last twelve months. The information displayed on this report is compiled from pharmacy submissions to the Department, and accurately reflects the information as submitted by the pharmacies.  This report was requested by: Blossom HoopsEmily C Dollie Mayse   Reference #: [id number]   Others' Prescriptions  Patient Name: Cameron [name] Birth Date: [birth year]   Address[place] [address] [place], [place] [id number] Sex: Male   Rx Written Rx Dispensed Drug Quantity Days Supply Prescriber Name Payment Method Dispenser   10/31/2018 11/01/2018 oxycodone hcl 10 mg tablet  84 14 [name], Gwenith Spitziffany L MD [facility]   10/16/2018 10/16/2018 oxycodone hcl 10 mg tablet  84 14 Roselind Rilyeinhardt, Kristine M [facility]   09/26/2018 09/29/2018 oxycodone hcl 10 mg tablet  84 14 [name], [name] Cash [facility]   09/04/2018 09/11/2018 oxycodone hcl 10 mg tablet  84 14 Roselind Rilyeinhardt, Kristine M [facility]   09/03/2018 09/04/2018 oxycodone hcl 10 mg tablet  40 20 Roselind Rilyeinhardt, Kristine M [facility]   08/20/2018 08/21/2018 oxycodone hcl 10 mg tablet  84 14 [name], Gwenith Spitziffany L MD Insurance [facility]   08/07/2018 08/07/2018 oxycodone hcl 10 mg tablet  84 14 [name], [name] [facility]   07/23/2018 07/24/2018 oxycodone hcl 10 mg tablet  84 14 [name], [name] [facility]   07/08/2018 07/11/2018 oxycodone hcl 10 mg tablet  84 14 Roselind Rilyeinhardt, Kristine M [facility]   06/27/2018 06/27/2018 oxycodone hcl 10 mg  tablet  84 14 Roselind Rilyeinhardt, Kristine M [facility]   06/11/2018 06/13/2018 oxycodone hcl 10 mg tablet  84 14 [name], Gwenith Spitziffany L MD Insurance [facility]   05/30/2018 05/30/2018 oxycodone hcl 10 mg tablet  84 14 Roselind Rilyeinhardt, Kristine M [facility]   05/14/2018 05/14/2018 oxycodone hcl 10 mg tablet  84 14 [name], [name] [facility]   04/30/2018 04/30/2018 oxycodone hcl 10 mg tablet  84 14 [name], [name] [facility]   04/09/2018 04/09/2018 oxycodone hcl 10 mg tablet  84 14 Roselind Rilyeinhardt, Kristine M [facility]   03/25/2018 03/25/2018 oxycodone hcl 10 mg tablet  80 14 Roselind Rilyeinhardt, Kristine M [facility]   03/12/2018 03/12/2018 oxycodone hcl 10 mg tablet  80 13 [name], Gwenith Spitziffany L MD Insurance [facility]   02/25/2018 02/25/2018 oxycodone hcl 10 mg tablet  80 13 [name], Gwenith Spitziffany L MD [facility]   02/13/2018 02/13/2018 oxycodone hcl 10 mg tablet  80 13 [name], Gwenith Spitziffany L MD [facility]   01/21/2018 01/21/2018 oxycodone hcl 10 mg tablet  80 13 [name], Gwenith Spitziffany L MD [facility]   01/08/2018 01/08/2018 oxycodone hcl 10 mg tablet  40 7 Lie, Ariadne Insurance [facility]   12/25/2017 12/25/2017 oxycodone hcl 10 mg tablet  40 7 [name],  Gwenith Spitziffany L MD Insurance [facility]   12/04/2017 12/05/2017 oxycodone hcl 10 mg tablet  40 7 [name], Gwenith Spitziffany L MD [facility]   11/25/2017 11/26/2017 oxycodone hcl 10 mg tablet  40 6 [name], Gwenith Spitziffany L MD [facility]   11/14/2017 11/14/2017 oxycodone hcl 10 mg tablet  40 7 [name], [name] Insurance [facility]

## 2018-11-04 ENCOUNTER — Encounter: Payer: Self-pay | Admitting: Internal Medicine

## 2018-11-04 DIAGNOSIS — Z5321 Procedure and treatment not carried out due to patient leaving prior to being seen by health care provider: Secondary | ICD-10-CM

## 2018-11-05 ENCOUNTER — Ambulatory Visit: Payer: Medicaid Other | Attending: Primary Care | Admitting: Primary Care

## 2018-11-05 ENCOUNTER — Encounter: Payer: Self-pay | Admitting: Primary Care

## 2018-11-05 VITALS — BP 120/54 | HR 65 | Ht 72.0 in | Wt 193.0 lb

## 2018-11-05 DIAGNOSIS — D571 Sickle-cell disease without crisis: Secondary | ICD-10-CM | POA: Insufficient documentation

## 2018-11-05 DIAGNOSIS — D564 Hereditary persistence of fetal hemoglobin [HPFH]: Secondary | ICD-10-CM | POA: Insufficient documentation

## 2018-11-05 NOTE — Progress Notes (Signed)
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   Back pain wakes him sometime in the middle of the night  Interventions at home: taking oxycodone, no ibuprofen, drinking increase water    Current home management:  Non pharmacologic interventions:  Non opioid pain modifying medications:  Short acting Opioid pain medications:oxycodone  10 mg Q 4 hr  Long acting Opioid pain medications: None      Current Pain score:   Pain    11/05/18 1343   PainSc:   0 - No pain       Emergency Room visits for this event: 0  Hospitalizations for this event: 0    Current disease modifying therapy: none    Medications Reviewed and changes     Current Outpatient Medications:     oxyCODONE (ROXICODONE) 10 MG immediate release tablet, Take 1 tablet (10 mg total) by mouth every 4 hours as needed for Pain Max daily dose: 60 mg, Disp: 84 tablet, Rfl: 0    folic acid (FOLVITE) 1 MG tablet, Take 1 tablet (1 mg total) by mouth daily, Disp: 30 tablet, Rfl: 5    mesalamine (ASACOL HD) 800 MG tablet, Take 3 tablets (2,400 mg total) by mouth 2 times daily, Disp: 180 tablet, Rfl: 2    cholecalciferol (VITAMIN D) 1000 UNIT capsule, Take 2 capsules (2,000 Units total) by mouth daily, Disp: 60 capsule, Rfl: 5    ferrous sulfate 325 (65 FE) MG tablet, Take 1 tablet (325 mg total) by mouth 2 times daily (with meals), Disp: 100 tablet, Rfl: 3    diclofenac (VOLTAREN) 1 % gel, Apply 4 g to affected area 4 times daily., Disp: 100 g, Rfl: 2    Elastic Bandages & Supports (ANKLE WRAP) MISC, Use PRN for right ankle. (Patient not taking:  Reported on 11/05/2018), Disp: 2 each, Rfl: 0    acetaminophen (TYLENOL) 500 mg tablet, Take 500 mg by mouth every 6 hours as needed, Disp: , Rfl:     cyclobenzaprine (FLEXERIL) 10 MG tablet, Take 1 tablet (10 mg total) by mouth 3 times daily as needed for Muscle spasms (Patient not taking: Reported on 09/02/2018), Disp: 45 tablet, Rfl: 0    naloxone (NARCAN) 4 mg/0.1 mL nasal spray, Instill 1 spray in 1 nostril once for opioid reversal. Repeat in alternating nostrils with new package every 2-3 minutes until response, Disp: 2 each, Rfl: 5  Review of Systems   Constitutional: Negative for activity change, appetite change, fatigue and fever.   Respiratory: Negative for cough, chest tightness and shortness of breath.    Gastrointestinal: Negative for constipation, nausea and vomiting.   Musculoskeletal: Positive for back pain.   Neurological: Negative for dizziness.   Psychiatric/Behavioral: Positive for sleep disturbance (due to pain). Negative for dysphoric mood.     OBJECTIVE     BP 120/54    Pulse 65    Ht 1.829 m (6')    Wt 87.5 kg (193 lb)    SpO2 100%  BMI 26.18 kg/m   Physical Exam   Constitutional: He appears well-developed and well-nourished.   Uncomfortable appearing young man   Eyes: No scleral icterus.   Cardiovascular: Normal rate and regular rhythm.   Pulmonary/Chest: He has no wheezes.   Abdominal: He exhibits no distension.   Skin: Skin is warm and dry.   Psychiatric: Judgment and thought content normal.           Lab results: 10/22/18  1940   WBC 7.0   Hemoglobin 5.4*   Hematocrit 19*   RBC 3.2*   Platelets 269         ASSESSMENT / DIAGNOSIS     Lark was seen today for follow-up with increased back pain due to what patient related to VOE pain.    Diagnoses and all orders for this visit:    Sickle cell disease with HPFH  Recheck labs     Patient Instructions   Apply rice sock to the area of pain in your back    We will try to establish an appointment for the infusion center in the next couple of  days    Work to take ibuprofen 800 mg three times a day for the next two days    Acetaminophen  2 tab (1000 mg) three times a day for the next two days    Goal is to drink 2 gallons of water per day       Encouraged oral hydration to   -     CBC and differential; Future  -     Comprehensive metabolic panel; Future  -     Reticulocytes; Future  -     Reticulocytes  -     Comprehensive metabolic panel  -     CBC and differential    Other orders  -     RBC morphology  -     Resolution        Pain management treatment plan   Patient has co diagnosis of ulcerative colitis dx age 43.  In addition Blood type O+ with antibodies anti-fya,jkb,lea  Outpatient Pain Regimen:  Long acting: none  Short acting; Oxy IR 10mg  Q4 hrs prn pain  Home Based titration regimen: none, needs assessment due to uncontrolled UC0  Adjunct therapies: none, holding NSAID due to ongoing LGI bleeding  Other: none    Preferred hospital: [facility]    ED/Infusion Center care plan recommendations:  Toradol 30 mg IV Q6 hrs ATC  NS 2 L IV bolus  Transfuse for HcT <18  Hydromorphone 2 mg IV Q30 min prn pain score>5  Recommend hospitalization is pain score remains >5 after 3 doses of Hydromorphone 2 mg IV    Labs on arrival: CBC, retic, CHEM14  CXR if complaints of chest pain to evaluate for infiltrate consistent with acute chest syndrome  Assess for need for bronchodilator if complaints of chest pain    Inpatient care plan recommendations:  Toradol 30 mg IV Q6 hrs ATC  NS bolus until euvolemic followed by D51/2NS at 125 cc/hr until tolerating PO well  PCA: Start with demand only Hydromorophone PCA 1 mg Q20 min with lockout 12 mg over 4 hrs Recommend titration of pain regimen until achieving pain score <5     Prior to discharge recommend 24 hours of PO pain medications to assess for sx of withdrawal with transition and continued pain control to prevent readmission    Transfuse if HcT< 18 and consider GI consult as patient's  most recent cause for  profound anemia is due to UC    Venofer orders in Dr Concha NorwayPulcino        Return in about 2 weeks (around 11/19/2018) for SCD f/u.  --Patient instructed to call if symptoms are not improving or worsening      Electronically signed by Benay PillowIFFANY L Terrionna Bridwell, MD 11/05/2018   1:59 PM  [facility], Phone: [phone]

## 2018-11-05 NOTE — Progress Notes (Signed)
[facility] - 09 Prescriptions  Confidential Drug Report  Search Terms: Nathanel Mubarak, [birth year]   Search Date: 11/05/2018 01:46:38 PM   Searching on behalf of: [id number] - [name]   The Drug Utilization Report below displays all of the controlled substance prescriptions, if any, that your patient has filled in the last twelve months. The information displayed on this report is compiled from pharmacy submissions to the Department, and accurately reflects the information as submitted by the pharmacies.  This report was requested by: Marylene Land Hisao Doo   Reference #: [id number]   Others' Prescriptions  Patient Name: Cameron [name] Birth Date: January 17, 1996   Address: [address] [place], [place] [address] Sex: Male   Rx Written Rx Dispensed Drug Quantity Days Supply Prescriber Name Payment Method Dispenser   10/31/2018 11/01/2018 oxycodone hcl 10 mg tablet  84 14 [name], [name] MD Insurance [facility]   10/16/2018 10/16/2018 oxycodone hcl 10 mg tablet  84 14 [name] Insurance [facility]   09/26/2018 09/29/2018 oxycodone hcl 10 mg tablet  84 14 [name], [name] Cash [facility]   09/04/2018 09/11/2018 oxycodone hcl 10 mg tablet  84 14 [name] Insurance [facility]   09/03/2018 09/04/2018 oxycodone hcl 10 mg tablet  40 20 [name] [facility]   08/20/2018 08/21/2018 oxycodone hcl 10 mg tablet  84 14 [name], [name] MD Insurance [facility]   08/07/2018 08/07/2018 oxycodone hcl 10 mg tablet  84 14 [name], [name] Insurance [facility]   07/23/2018 07/24/2018 oxycodone hcl 10 mg tablet  84 14 [name], [name] Insurance [facility]   07/08/2018 07/11/2018 oxycodone hcl 10 mg tablet  84 14 [name] Insurance [facility]   06/27/2018 06/27/2018 oxycodone hcl 10 mg  tablet  84 14 [name] [facility]   06/11/2018 06/13/2018 oxycodone hcl 10 mg tablet  84 14 [name], [name] MD Insurance [facility]   05/30/2018 05/30/2018 oxycodone hcl 10 mg tablet  84 14 [name] Insurance [facility]   05/14/2018 05/14/2018 oxycodone hcl 10 mg tablet  84 14 [name], [name] Insurance [facility]   04/30/2018 04/30/2018 oxycodone hcl 10 mg tablet  84 14 [name], [name] Insurance [facility]   04/09/2018 04/09/2018 oxycodone hcl 10 mg tablet  84 14 [name] [facility]   03/25/2018 03/25/2018 oxycodone hcl 10 mg tablet  80 14 [name] [facility]   03/12/2018 03/12/2018 oxycodone hcl 10 mg tablet  80 13 [name], [name] MD Insurance [facility]   02/25/2018 02/25/2018 oxycodone hcl 10 mg tablet  80 13 [name], [name] MD Insurance [facility]   02/13/2018 02/13/2018 oxycodone hcl 10 mg tablet  80 13 [name], [name] MD Insurance [facility]   01/21/2018 01/21/2018 oxycodone hcl 10 mg tablet  80 13 [name], [name] MD Insurance [facility]   01/08/2018 01/08/2018 oxycodone hcl 10 mg tablet  40 7 Lie, Ariadne Insurance [facility]   12/25/2017 12/25/2017 oxycodone hcl 10 mg tablet  40 7 [name],  Gwenith Spitziffany L MD [facility]   12/04/2017 12/05/2017 oxycodone hcl 10 mg tablet  40 7 [name], Gwenith Spitziffany L MD [facility]   11/25/2017 11/26/2017 oxycodone hcl 10 mg tablet  40 6 [name], Gwenith Spitziffany L MD [facility]   11/14/2017 11/14/2017 oxycodone hcl 10 mg tablet  40 7 [name], [name] [facility]   * - Drugs marked with an asterisk are compound drugs. If the compound  drug is made up of more than one controlled substance, then each controlled substance will be a separate row in the table.

## 2018-11-05 NOTE — Patient Instructions (Addendum)
Apply rice sock to the area of pain in your back    We will try to establish an appointment for the infusion center in the next couple of days    Work to take ibuprofen 800 mg three times a day for the next two days    Acetaminophen  2 tab (1000 mg) three times a day for the next two days    Goal is to drink 2 gallons of water per day

## 2018-11-06 ENCOUNTER — Telehealth: Payer: Self-pay | Admitting: Internal Medicine

## 2018-11-06 LAB — CBC AND DIFFERENTIAL
Baso # K/uL: 0 10*3/uL (ref 0.0–0.1)
Basophil %: 0.6 %
Eos # K/uL: 0.2 10*3/uL (ref 0.0–0.5)
Eosinophil %: 4.8 %
Hematocrit: 21 % — ABNORMAL LOW (ref 40–51)
Hemoglobin: 5.4 g/dL — ABNORMAL LOW (ref 13.7–17.5)
IMM Granulocytes #: 0 10*3/uL
IMM Granulocytes: 0.6 %
Lymph # K/uL: 1.4 10*3/uL (ref 1.3–3.6)
Lymphocyte %: 29 %
MCH: 16 pg/cell — ABNORMAL LOW (ref 26–32)
MCHC: 26 g/dL — ABNORMAL LOW (ref 32–37)
MCV: 62 fL — ABNORMAL LOW (ref 79–92)
Mono # K/uL: 0.5 10*3/uL (ref 0.3–0.8)
Monocyte %: 10.1 %
Neut # K/uL: 2.7 10*3/uL (ref 1.8–5.4)
Nucl RBC # K/uL: 0 10*3/uL (ref 0.0–0.0)
Nucl RBC %: 0 /100 WBC (ref 0.0–0.2)
Platelets: 436 10*3/uL — ABNORMAL HIGH (ref 150–330)
RBC: 3.3 MIL/uL — ABNORMAL LOW (ref 4.6–6.1)
RDW: 25.2 % — ABNORMAL HIGH (ref 11.6–14.4)
Seg Neut %: 54.9 %
WBC: 4.8 10*3/uL (ref 4.2–9.1)

## 2018-11-06 LAB — COMPREHENSIVE METABOLIC PANEL
ALT: 63 U/L — ABNORMAL HIGH (ref 0–50)
AST: 48 U/L (ref 0–50)
Albumin: 3.5 g/dL (ref 3.5–5.2)
Alk Phos: 626 U/L — ABNORMAL HIGH (ref 40–130)
Anion Gap: 12 (ref 7–16)
Bilirubin,Total: 0.6 mg/dL (ref 0.0–1.2)
CO2: 24 mmol/L (ref 20–28)
Calcium: 8.6 mg/dL — ABNORMAL LOW (ref 9.3–10.5)
Chloride: 101 mmol/L (ref 96–108)
Creatinine: 0.71 mg/dL (ref 0.67–1.17)
GFR,Black: 155 *
GFR,Caucasian: 134 *
Glucose: 41 mg/dL — CL (ref 60–99)
Lab: 6 mg/dL (ref 6–20)
Potassium: 4.9 mmol/L (ref 3.3–5.1)
Sodium: 137 mmol/L (ref 133–145)
Total Protein: 8.2 g/dL — ABNORMAL HIGH (ref 6.3–7.7)

## 2018-11-06 LAB — RBC MORPHOLOGY

## 2018-11-06 LAB — RETICULOCYTES
Retic %: 1.8 % (ref 0.7–2.3)
Retic Abs: 61.5 10*3/uL (ref 33.8–124.0)

## 2018-11-11 ENCOUNTER — Telehealth: Payer: Self-pay | Admitting: Primary Care

## 2018-11-11 ENCOUNTER — Other Ambulatory Visit: Payer: Self-pay | Admitting: Primary Care

## 2018-11-11 ENCOUNTER — Other Ambulatory Visit: Payer: Self-pay | Admitting: Internal Medicine

## 2018-11-11 ENCOUNTER — Ambulatory Visit: Payer: Medicaid Other

## 2018-11-11 MED ORDER — ERGOCALCIFEROL 50000 UNIT PO CAPS *I*
50000.0000 [IU] | ORAL_CAPSULE | ORAL | 5 refills | Status: DC
Start: 2018-11-11 — End: 2018-11-11

## 2018-11-11 MED ORDER — SODIUM CHLORIDE 0.9 % IV SOLN WRAPPED *I*
100.0000 mg | Freq: Once | INTRAVENOUS | Status: AC
Start: 2018-11-11 — End: 2018-11-12
  Filled 2018-11-11: qty 5

## 2018-11-11 NOTE — Telephone Encounter (Signed)
Cameron [name]' labs look good but noted rising Alk Phos - I have sent in vitamin D once a week for him to start - likely low due to Chrons disease 50k weekly

## 2018-11-11 NOTE — Progress Notes (Signed)
Orders signed.

## 2018-11-12 ENCOUNTER — Telehealth: Payer: Self-pay | Admitting: Primary Care

## 2018-11-12 NOTE — Telephone Encounter (Signed)
Called and left detailed message on pts voicemail  Requested a call back to the office  Number provided

## 2018-11-12 NOTE — Telephone Encounter (Signed)
Spoke with Trula Ore in specimen handling  Reports that the specimens were never received by there labs until 16:16 on the 13th  CBC's specifically are only stable to be run for 24 hours after collection  Inquiring if labs were drawn on the 12 or the 13th  Writer confirmed that they were drawn in the office on the 12th  Trula Ore is going to speak with outreach and investigate why the labs were delivered 24 hours after they were drawn  Please disregard BG value as this was most likely incorrect due to time between draw and receiving   Rest of the labs should be valid

## 2018-11-12 NOTE — Telephone Encounter (Signed)
[facility] TELEPHONE TRIAGE     Reason for call: Trula Ore is inquiring the exact time and date that patient got blood drawn. If patient got labs done on the 12th of February then those results are no good and the provider will have to put another order in. Trula Ore has requested a call back.     Name of caller: Trula Ore for Cameron [name]  Relationship to patient: Lab staff  Organization (if applicable): [facility]  Phone: [phone]

## 2018-11-14 ENCOUNTER — Other Ambulatory Visit: Payer: Self-pay | Admitting: Internal Medicine

## 2018-11-14 ENCOUNTER — Other Ambulatory Visit: Payer: Self-pay | Admitting: Primary Care

## 2018-11-14 MED ORDER — OXYCODONE HCL 10 MG PO TABS *I*
10.0000 mg | ORAL_TABLET | ORAL | 0 refills | Status: DC | PRN
Start: 2018-11-14 — End: 2018-11-24

## 2018-11-14 MED ORDER — MESALAMINE 800 MG PO TBEC *I*
2400.0000 mg | DELAYED_RELEASE_TABLET | Freq: Two times a day (BID) | ORAL | 2 refills | Status: DC
Start: 2018-11-14 — End: 2018-11-14

## 2018-11-14 NOTE — Telephone Encounter (Signed)
Najja Chatel - 37 Prescriptions  Confidential Drug Report  Search Terms: Michall Laverde, 15-Feb-1996   Search Date: 11/14/2018 08:20:23 AM   Searching on behalf of: [id number] - Karle Barr   The Drug Utilization Report below displays all of the controlled substance prescriptions, if any, that your patient has filled in the last twelve months. The information displayed on this report is compiled from pharmacy submissions to the Department, and accurately reflects the information as submitted by the pharmacies.  This report was requested by: Alvira Philips   Reference #: [id number]   Others' Prescriptions  Patient Name: Tionne Zervas Birth Date: 1996-03-14   Address: [address] [place], [place] [address] Sex: Male   Rx Written Rx Dispensed Drug Quantity Days Supply Prescriber Name Payment Method Dispenser   10/31/2018 11/01/2018 oxycodone hcl 10 mg tablet  84 14 [name], [name] MD [facility]   10/16/2018 10/16/2018 oxycodone hcl 10 mg tablet  84 14 [name] [facility]   09/26/2018 09/29/2018 oxycodone hcl 10 mg tablet  84 14 [name], [name] [facility]   09/04/2018 09/11/2018 oxycodone hcl 10 mg tablet  84 14 [name] [facility]   09/03/2018 09/04/2018 oxycodone hcl 10 mg tablet  40 20 [name] [facility]   08/20/2018 08/21/2018 oxycodone hcl 10 mg tablet  84 14 [name], [name] MD [facility]   08/07/2018 08/07/2018 oxycodone hcl 10 mg tablet  84 14 [name], [name] [facility]   07/23/2018 07/24/2018 oxycodone hcl 10 mg tablet  84 14 [name], [name] [facility]   07/08/2018 07/11/2018 oxycodone hcl 10 mg tablet  84 14 [name] [facility]   06/27/2018 06/27/2018 oxycodone hcl 10 mg tablet  84  14 [name] [facility]   06/11/2018 06/13/2018 oxycodone hcl 10 mg tablet  84 14 [name], [name] MD [facility]   05/30/2018 05/30/2018 oxycodone hcl 10 mg tablet  84 14 [name] [facility]   05/14/2018 05/14/2018 oxycodone hcl 10 mg tablet  84 14 [name], [name] [facility]   04/30/2018 04/30/2018 oxycodone hcl 10 mg tablet  84 14 [name], [name] [facility]   04/09/2018 04/09/2018 oxycodone hcl 10 mg tablet  84 14 [name] [facility]   03/25/2018 03/25/2018 oxycodone hcl 10 mg tablet  80 14 [name] [facility]   03/12/2018 03/12/2018 oxycodone hcl 10 mg tablet  80 13 [name], [name] MD [facility]   02/25/2018 02/25/2018 oxycodone hcl 10 mg tablet  80 13 [name], [name] MD [facility]   02/13/2018 02/13/2018 oxycodone hcl 10 mg tablet  80 13 [name], [name] MD [facility]   01/21/2018 01/21/2018 oxycodone hcl 10 mg tablet  80 13 [name], [name] MD [facility]   01/08/2018 01/08/2018 oxycodone hcl 10 mg tablet  40 7 Lie, Ariadne [facility]   12/25/2017 12/25/2017 oxycodone hcl 10 mg tablet  40 7 [name],  [name] MD [facility]   12/04/2017 12/05/2017 oxycodone hcl 10 mg tablet  40 7 [name], [name] MD [facility]   11/25/2017 11/26/2017 oxycodone hcl 10 mg tablet  40 6 [name], [name] [facility]   * - Drugs marked with an asterisk are compound drugs. If the compound drug is made up of more than one controlled substance, then each controlled substance will be a separate row in the table.

## 2018-11-15 NOTE — Telephone Encounter (Signed)
This encounter was created in error - please disregard.

## 2018-11-15 NOTE — Telephone Encounter (Signed)
Danielle from [facility] calling with report of critical BG equal to 41    Lab states blood was drawn at 4 in the afternoon, and run at 6 PM.  I suspect this is a lab error.    Attempted to reach the patient by phone  There was no answer  Left a voice message requesting that he contact the office at [phone]    Jacki Cones, MD

## 2018-11-17 NOTE — Telephone Encounter (Signed)
errir    This encounter was created in error - please disregard.

## 2018-11-20 ENCOUNTER — Encounter: Payer: Self-pay | Admitting: Gastroenterology

## 2018-11-22 ENCOUNTER — Encounter: Payer: Self-pay | Admitting: Primary Care

## 2018-11-24 ENCOUNTER — Ambulatory Visit: Payer: Medicaid Other | Admitting: Primary Care

## 2018-11-24 ENCOUNTER — Telehealth: Payer: Self-pay | Admitting: Primary Care

## 2018-11-24 MED ORDER — OXYCODONE HCL 10 MG PO TABS *I*
10.0000 mg | ORAL_TABLET | ORAL | 0 refills | Status: DC | PRN
Start: 2018-11-24 — End: 2018-12-08

## 2018-11-25 NOTE — Telephone Encounter (Signed)
Addressed at office yesterday

## 2018-11-25 NOTE — Telephone Encounter (Signed)
Writer left VM with patient to schedule follow up visit with provider. Writer provided contact to receive a return call.

## 2018-11-30 NOTE — Telephone Encounter (Signed)
Order for medication

## 2018-12-08 ENCOUNTER — Other Ambulatory Visit: Payer: Self-pay | Admitting: Primary Care

## 2018-12-09 MED ORDER — OXYCODONE HCL 10 MG PO TABS *I*
10.0000 mg | ORAL_TABLET | ORAL | 0 refills | Status: DC | PRN
Start: 2018-12-09 — End: 2018-12-22

## 2018-12-09 NOTE — Telephone Encounter (Signed)
Cameron [name] - 81 Prescriptions  Confidential Drug Report  Search Terms: Cameron [name], Oct 27, 1995   Search Date: 12/09/2018 08:02:19 AM   Searching on behalf of: [id number] - Cameron [name]   The Drug Utilization Report below displays all of the controlled substance prescriptions, if any, that your patient has filled in the last twelve months. The information displayed on this report is compiled from pharmacy submissions to the Department, and accurately reflects the information as submitted by the pharmacies.  This report was requested by: [name]   Reference #: [id number]   Others' Prescriptions  Patient Name: Cameron [name]   Birth Date: 10/23/1995   Address: [address] [place], [place] [address]   Sex: Male   Rx Written Rx Dispensed Drug Quantity Days Supply Prescriber Name Payment Method Dispenser   2018/11/24 2018/11/26 oxycodone hcl 10 mg tablet  84 14 [name], [name] MD [facility]   2018/11/14 2018/11/14 oxycodone hcl 10 mg tablet  84 14 [name] [facility]   2018/10/31 2018/11/01 oxycodone hcl 10 mg tablet  84 14 [name], [name] MD [facility]   2018/10/16 2018/10/16 oxycodone hcl 10 mg tablet  84 14 [name] [facility]   2018/09/26 2018/09/29 oxycodone hcl 10 mg tablet  84 14 [name], [name] MD [facility]   2018/09/04 2018/09/11 oxycodone hcl 10 mg tablet  84 14 [name] [facility]   2018/09/03 2018/09/04 oxycodone hcl 10 mg tablet  40 20 [name] [facility]   2018/08/20 2018/08/21 oxycodone hcl 10 mg tablet  84 14 [name], [name] MD [facility]   2018/08/07 2018/08/07 oxycodone hcl 10 mg tablet  84 14 [name], [name] MD [facility]   2018/07/23 2018/07/24 oxycodone hcl 10  mg tablet  84 14 [name], [name] MD [facility]   2018/07/08 2018/07/11 oxycodone hcl 10 mg tablet  84 14 [name] [facility]   2018/06/27 2018/06/27 oxycodone hcl 10 mg tablet  84 14 [name] [facility]   2018/06/11 2018/06/13 oxycodone hcl 10 mg tablet  84 14 [name], [name] MD [facility]   2018/05/30 2018/05/30 oxycodone hcl 10 mg tablet  84 14 [name] [facility]   2018/05/14 2018/05/14 oxycodone hcl 10 mg tablet  84 14 [name], [name] MD [facility]   2018/04/30 2018/04/30 oxycodone hcl 10 mg tablet  84 14 [name], [name] MD [facility]   2018/04/09 2018/04/09 oxycodone hcl 10 mg tablet  84 14 [name] [facility]   2018/03/25 2018/03/25 oxycodone hcl 10 mg tablet  80 14 [name] [facility]   2018/03/12 2018/03/12 oxycodone hcl 10 mg tablet  80 13 [name], [name] MD [facility]   2018/02/25 2018/02/25 oxycodone hcl 10 mg tablet  80 13 [name], [name] MD [facility]   2018/02/13 2018/02/13 oxycodone hcl 10 mg tablet  80 13 [name], [name] MD [facility]   2018/01/21 2018/01/21 oxycodone hcl 10 mg  tablet  80 13 [name], [name] MD [facility]   2018/01/08 2018/01/08 oxycodone hcl 10 mg tablet  40 7 Lie, Ariadne [facility]   2017/12/25 2017/12/25 oxycodone hcl 10 mg tablet  40 7 [name], [name] [facility] [place] [facility]

## 2018-12-22 ENCOUNTER — Other Ambulatory Visit: Payer: Self-pay | Admitting: Primary Care

## 2018-12-22 DIAGNOSIS — D571 Sickle-cell disease without crisis: Secondary | ICD-10-CM

## 2018-12-22 NOTE — Telephone Encounter (Signed)
Cameron [name] - 70 Prescriptions  Confidential Drug Report  Search Terms: Travyon Cooling, [birth year]   Search Date: 12/22/2018 16:26:49 PM   Searching on behalf of: [id number] - [name]   The Drug Utilization Report below displays all of the controlled substance prescriptions, if any, that your patient has filled in the last twelve months. The information displayed on this report is compiled from pharmacy submissions to the Department, and accurately reflects the information as submitted by the pharmacies.  This report was requested by: [name]   Reference #: [id number]   Others' Prescriptions  Patient Name: Cameron [name]   Birth Date: [birth year]   Address: [address] [place], [place] [address]   Sex: Male   Rx Written Rx Dispensed Drug Quantity Days Supply Prescriber Name Payment Method Dispenser   2018/12/09 2018/12/10 oxycodone hcl 10 mg tablet  84 14 [name], [name] MD [facility]   2018/11/24 2018/11/26 oxycodone hcl 10 mg tablet  84 14 [name], [name] MD [facility]   2018/11/14 2018/11/14 oxycodone hcl 10 mg tablet  84 14 [name] [facility]   2018/10/31 2018/11/01 oxycodone hcl 10 mg tablet  84 14 [name], [name] MD [facility]   2018/10/16 2018/10/16 oxycodone hcl 10 mg tablet  84 14 [name] [facility]   2018/09/26 2018/09/29 oxycodone hcl 10 mg tablet  84 14 [name], [name] MD [facility]   2018/09/04 2018/09/11 oxycodone hcl 10 mg tablet  84 14 [name] [facility]   2018/09/03 2018/09/04 oxycodone hcl 10 mg tablet  40 20 [name] [facility]   2018/08/20 2018/08/21 oxycodone hcl 10 mg tablet  84 14 [name], [name] MD [facility]   2018/08/07 2018/08/07 oxycodone hcl 10  mg tablet  84 14 [name], [name] MD [facility]   2018/07/23 2018/07/24 oxycodone hcl 10 mg tablet  84 14 [name], [name] MD [facility]   2018/07/08 2018/07/11 oxycodone hcl 10 mg tablet  84 14 [name] [facility]   2018/06/27 2018/06/27 oxycodone hcl 10 mg tablet  84 14 [name] [facility]   2018/06/11 2018/06/13 oxycodone hcl 10 mg tablet  84 14 [name], [name] MD [facility]   2018/05/30 2018/05/30 oxycodone hcl 10 mg tablet  84 14 [name] [facility]   2018/05/14 2018/05/14 oxycodone hcl 10 mg tablet  84 14 [name], [name] MD [facility]   2018/04/30 2018/04/30 oxycodone hcl 10 mg tablet  84 14 [name], [name] MD [facility]   2018/04/09 2018/04/09 oxycodone hcl 10 mg tablet  84 14 [name] [facility]   2018/03/25 2018/03/25 oxycodone hcl 10 mg tablet  80 14 [name] [facility]   2018/03/12 2018/03/12 oxycodone hcl 10 mg tablet  80 13 [name], [name] MD [facility]   2018/02/25 2018/02/25 oxycodone hcl 10 mg tablet  80 13 [name], [name] MD [facility]   2018/02/13 2018/02/13 oxycodone hcl 10 mg  tablet  80 13 [name], [name] MD [facility]   2018/01/21 2018/01/21 oxycodone hcl 10 mg tablet  80 13 [name], [name] MD [facility]   2018/01/08 2018/01/08 oxycodone hcl 10 mg tablet  40 7 Lie, Ariadne [facility]   2017/12/25 2017/12/25 oxycodone hcl 10 mg tablet  40 7 [name], [name] Insurance [facility] [place] [facility]

## 2018-12-23 ENCOUNTER — Other Ambulatory Visit: Payer: Self-pay | Admitting: Primary Care

## 2018-12-23 DIAGNOSIS — D571 Sickle-cell disease without crisis: Secondary | ICD-10-CM

## 2018-12-23 MED ORDER — OXYCODONE HCL 10 MG PO TABS *I*
10.0000 mg | ORAL_TABLET | ORAL | 0 refills | Status: DC | PRN
Start: 2018-12-23 — End: 2019-01-07

## 2019-01-07 ENCOUNTER — Other Ambulatory Visit: Payer: Self-pay | Admitting: Primary Care

## 2019-01-07 DIAGNOSIS — D571 Sickle-cell disease without crisis: Secondary | ICD-10-CM

## 2019-01-08 ENCOUNTER — Other Ambulatory Visit: Payer: Self-pay | Admitting: Primary Care

## 2019-01-08 DIAGNOSIS — D571 Sickle-cell disease without crisis: Secondary | ICD-10-CM

## 2019-01-08 NOTE — Telephone Encounter (Signed)
Cameron [name] - 36 Prescriptions  Confidential Drug Report  Search Terms: Shreyansh Clemenson, Jan 29, 1996   Search Date: 01/08/2019 08:34:04 AM   Searching on behalf of: [id number] - Cameron [name]   The Drug Utilization Report below displays all of the controlled substance prescriptions, if any, that your patient has filled in the last twelve months. The information displayed on this report is compiled from pharmacy submissions to the Department, and accurately reflects the information as submitted by the pharmacies.  This report was requested by: Alvira Philips   Reference #: [id number]   Others' Prescriptions  Patient Name: Cameron [name]   Birth Date: [birth year]   Address: [address] [place], [place] [address]   Sex: Male   Rx Written Rx Dispensed Drug Quantity Days Supply Prescriber Name Payment Method Dispenser   12/23/2018 12/24/2018 oxycodone hcl 10 mg tablet  84 14 [name], [name] MD [facility]   12/09/2018 12/10/2018 oxycodone hcl 10 mg tablet  84 14 [name], [name] MD [facility]   11/24/2018 11/26/2018 oxycodone hcl 10 mg tablet  84 14 [name], [name] MD [facility]   11/14/2018 11/14/2018 oxycodone hcl 10 mg tablet  84 14 [name] [facility]   10/31/2018 11/01/2018 oxycodone hcl 10 mg tablet  84 14 [name], [name] MD [facility]   10/16/2018 10/16/2018 oxycodone hcl 10 mg tablet  84 14 [name] [facility]   09/26/2018 09/29/2018 oxycodone hcl 10 mg tablet  84 14 [name], [name] [facility]   09/04/2018 09/11/2018 oxycodone hcl 10 mg tablet  84 14 [name] [facility]   09/03/2018 09/04/2018 oxycodone hcl 10 mg tablet  40 20 [name] [facility]   08/20/2018 08/21/2018 oxycodone hcl 10  mg tablet  84 14 [name], [name] MD [facility]   08/07/2018 08/07/2018 oxycodone hcl 10 mg tablet  84 14 [name], [name] MD [facility]   07/23/2018 07/24/2018 oxycodone hcl 10 mg tablet  84 14 [name], [name] MD [facility]   07/08/2018 07/11/2018 oxycodone hcl 10 mg tablet  84 14 [name] [facility]   06/27/2018 06/27/2018 oxycodone hcl 10 mg tablet  84 14 [name] [facility]   06/11/2018 06/13/2018 oxycodone hcl 10 mg tablet  84 14 [name], [name] MD [facility]   05/30/2018 05/30/2018 oxycodone hcl 10 mg tablet  84 14 [name] [facility]   05/14/2018 05/14/2018 oxycodone hcl 10 mg tablet  84 14 [name], [name] MD [facility]   04/30/2018 04/30/2018 oxycodone hcl 10 mg tablet  84 14 [name], [name] MD [facility]   04/09/2018 04/09/2018 oxycodone hcl 10 mg tablet  84 14 [name] [facility]   03/25/2018 03/25/2018 oxycodone hcl 10 mg tablet  80 14 [name] [facility]   03/12/2018 03/12/2018 oxycodone hcl 10 mg tablet  80 13 [name], [name] MD [facility]   02/25/2018 02/25/2018 oxycodone hcl 10 mg  tablet  80 13 [name], Gwenith Spitziffany L MD [facility]   02/13/2018 02/13/2018 oxycodone hcl 10 mg tablet  80 13 [name], Gwenith Spitziffany L MD [facility]   01/21/2018 01/21/2018 oxycodone hcl 10 mg tablet  80 13 [name], Gwenith Spitziffany L MD [facility]   01/08/2018 01/08/2018 oxycodone hcl 10 mg tablet  40 7 Lie, Susanstadriadne [facility]   * - Drugs marked with an asterisk are compound drugs. If the  compound drug is made up of more than one controlled substance, then each controlled substance will be a separate row in the table.

## 2019-01-09 ENCOUNTER — Telehealth: Payer: Self-pay

## 2019-01-09 MED ORDER — OXYCODONE HCL 10 MG PO TABS *I*
10.0000 mg | ORAL_TABLET | ORAL | 0 refills | Status: DC | PRN
Start: 2019-01-09 — End: 2019-01-22

## 2019-01-09 NOTE — Telephone Encounter (Signed)
[facility]  [address]  [place] [address]  Phone: [phone]  Fax: [phone]       "I am reaching out to check in with you to see how you are doing and let you know that the [facility] team is here to support you during this pandemic of COVID 19.  It is my job to identify if you have what you need for the next 2-4 weeks to manage your chronic medical condition and then I will route this message to the right person on the team to help out"    Do you use anything at home to help you breath?  Ventilator/BIPAP/CPAP/oxygen  No  Do you use any nutrition supplements?  No  Do you have all the medications you need for the next month in your house?  Yes  Do you need any medication refills?  No  Do you need any equipment/supplies over the next month?  No  What is the best way to reach you? (confirm phone number and address patient will be staying)  [phone]    Any urgent paperwork that needs to be completed?  If it's electronic can you email [email or web address]  No    Anything that you need that I haven't asked that we should know about?  No    For the staff:   If they have breathing equipment route to [facility]   If they need medication refills route to PCP   If they have an illness/sick question route to p ccc nurse   If they have feeding supplies/supplements route to [place]   If they have a financial/housing or I don't know what to do with this route to [facility] Care management   If they have a mental health concern route to [facility]   If they have an urgent paperwork need route to p ccc front desk

## 2019-01-22 ENCOUNTER — Other Ambulatory Visit: Payer: Self-pay | Admitting: Primary Care

## 2019-01-22 DIAGNOSIS — D571 Sickle-cell disease without crisis: Secondary | ICD-10-CM

## 2019-01-23 NOTE — Telephone Encounter (Signed)
Cameron [name] - 99 Prescriptions  Confidential Drug Report  Search Terms: Cameron [name], 04-Jul-1996   Search Date: 01/23/2019 09:11:03 AM   Searching on behalf of: Cameron [name] - Cameron [name]   The Drug Utilization Report below displays all of the controlled substance prescriptions, if any, that your patient has filled in the last twelve months. The information displayed on this report is compiled from pharmacy submissions to the Department, and accurately reflects the information as submitted by the pharmacies.  This report was requested by: Cameron [name]   Reference #: [id number]   Others' Prescriptions  Patient Name: Cameron [name]   Birth Date: 1996/06/13   Address: [address] [place], [place] [address]   Sex: Male   Rx Written Rx Dispensed Drug Quantity Days Supply Prescriber Name Payment Method Dispenser   01/09/2019 01/10/2019 oxycodone hcl 10 mg tablet  84 14 [name], [name] MD [facility]   12/23/2018 12/24/2018 oxycodone hcl 10 mg tablet  84 14 [name], [name] MD [facility]   12/09/2018 12/10/2018 oxycodone hcl 10 mg tablet  84 14 [name], [name] MD [facility]   11/24/2018 11/26/2018 oxycodone hcl 10 mg tablet  84 14 [name], [name] MD [facility]   11/14/2018 11/14/2018 oxycodone hcl 10 mg tablet  84 14 [name] [facility]   10/31/2018 11/01/2018 oxycodone hcl 10 mg tablet  84 14 [name], [name] MD [facility]   10/16/2018 10/16/2018 oxycodone hcl 10 mg tablet  84 14 [name] [facility]   09/26/2018 09/29/2018 oxycodone hcl 10 mg tablet  84 14 [name], [name] [facility]   09/04/2018 09/11/2018 oxycodone hcl 10 mg tablet  84 14 [name] [facility]   09/03/2018 09/04/2018 oxycodone hcl 10  mg tablet  40 20 [name] [facility]   08/20/2018 08/21/2018 oxycodone hcl 10 mg tablet  84 14 [name], [name] MD Insurance [facility]   08/07/2018 08/07/2018 oxycodone hcl 10 mg tablet  84 14 [name], [name] MD [facility]   07/23/2018 07/24/2018 oxycodone hcl 10 mg tablet  84 14 [name], [name] MD [facility]   07/08/2018 07/11/2018 oxycodone hcl 10 mg tablet  84 14 [name] [facility]   06/27/2018 06/27/2018 oxycodone hcl 10 mg tablet  84 14 [name] [facility]   06/11/2018 06/13/2018 oxycodone hcl 10 mg tablet  84 14 [name], [name] MD [facility]   05/30/2018 05/30/2018 oxycodone hcl 10 mg tablet  84 14 [name] [facility]   05/14/2018 05/14/2018 oxycodone hcl 10 mg tablet  84 14 [name], [name] MD [facility]   04/30/2018 04/30/2018 oxycodone hcl 10 mg tablet  84 14 [name], [name] MD [facility]   04/09/2018 04/09/2018 oxycodone hcl 10 mg tablet  84 14 [name] [facility]   03/25/2018 03/25/2018 oxycodone hcl 10 mg tablet  80 14 [place], Barkley Bruns M Insurance [facility]   03/12/2018 03/12/2018 oxycodone hcl 10 mg  tablet  80 13 [name], Gwenith Spitziffany L MD [facility]   02/25/2018 02/25/2018 oxycodone hcl 10 mg tablet  80 13 [name], Gwenith Spitziffany L MD Insurance [facility]   02/13/2018 02/13/2018 oxycodone hcl 10 mg tablet  80 13 [name], [name] [facility] [place] [facility] [facility]

## 2019-01-26 ENCOUNTER — Other Ambulatory Visit: Payer: Self-pay | Admitting: Primary Care

## 2019-01-26 DIAGNOSIS — D571 Sickle-cell disease without crisis: Secondary | ICD-10-CM

## 2019-01-26 MED ORDER — OXYCODONE HCL 10 MG PO TABS *I*
10.0000 mg | ORAL_TABLET | ORAL | 0 refills | Status: DC | PRN
Start: 2019-01-26 — End: 2019-02-13

## 2019-02-09 ENCOUNTER — Other Ambulatory Visit: Payer: Self-pay | Admitting: Primary Care

## 2019-02-09 DIAGNOSIS — D571 Sickle-cell disease without crisis: Secondary | ICD-10-CM

## 2019-02-09 NOTE — Telephone Encounter (Signed)
Patient Name: Cameron [name]   Birth Date: 07/23/1996   Address: [address] [place], [place] [address]   Sex: Male   Rx Written Rx Dispensed Drug Quantity Days Supply Prescriber Name Payment Method Dispenser   01/26/2019 01/26/2019 oxycodone hcl 10 mg tablet  84 14 [name], [name] MD Insurance [facility]   01/09/2019 01/10/2019 oxycodone hcl 10 mg tablet  84 14 [name], [name] MD Insurance [facility]   12/23/2018 12/24/2018 oxycodone hcl 10 mg tablet  84 14 [name], [name] MD [facility]   12/09/2018 12/10/2018 oxycodone hcl 10 mg tablet  84 14 [name], [name] MD Insurance [facility]   11/24/2018 11/26/2018 oxycodone hcl 10 mg tablet  84 14 [name], [name] MD Insurance [facility]   11/14/2018 11/14/2018 oxycodone hcl 10 mg tablet  84 14 [name] [facility]   10/31/2018 11/01/2018 oxycodone hcl 10 mg tablet  84 14 [name], [name] MD Insurance [facility]   10/16/2018 10/16/2018 oxycodone hcl 10 mg tablet  84 14 [name] [facility]   09/26/2018 09/29/2018 oxycodone hcl 10 mg tablet  84 14 [name], [name] Cash [facility]   09/04/2018 09/11/2018 oxycodone hcl 10 mg tablet  84 14 [name] [facility]   09/03/2018 09/04/2018 oxycodone hcl 10 mg tablet  40 20 [name] [facility]   08/20/2018 08/21/2018 oxycodone hcl 10 mg tablet  84 14 [name], [name] MD [facility]   08/07/2018 08/07/2018 oxycodone hcl 10 mg tablet  84 14 [name], [name] [facility]   07/23/2018 07/24/2018 oxycodone hcl 10 mg tablet  84 14 [name], [name] [facility]   07/08/2018 07/11/2018 oxycodone hcl 10 mg tablet  84 14  [name] [facility]   06/27/2018 06/27/2018 oxycodone hcl 10 mg tablet  84 14 [name] [facility]   06/11/2018 06/13/2018 oxycodone hcl 10 mg tablet  84 14 [name], [name] MD [facility]   05/30/2018 05/30/2018 oxycodone hcl 10 mg tablet  84 14 [name] [facility]   05/14/2018 05/14/2018 oxycodone hcl 10 mg tablet  84 14 [name], [name] [facility]   04/30/2018 04/30/2018 oxycodone hcl 10 mg tablet  84 14 [name], [name] [facility]   04/09/2018 04/09/2018 oxycodone hcl 10 mg tablet  84 14 [name] [facility]   03/25/2018 03/25/2018 oxycodone hcl 10 mg tablet  80 14 [name] [facility]   03/12/2018 03/12/2018 oxycodone hcl 10 mg tablet  80 13 [name], [name] MD [facility]   02/25/2018 02/25/2018 oxycodone hcl 10 mg tablet  80 13 [name], [name] MD [facility]   02/13/2018 02/13/2018 oxycodone hcl 10 mg tablet  80 13 [name], [name] Insurance [facility]   * - Drugs marked with an asterisk are compound drugs. If the compound drug is made up of more  than one controlled substance, then each controlled substance will be a separate row in the table.

## 2019-02-10 ENCOUNTER — Other Ambulatory Visit: Payer: Self-pay | Admitting: Primary Care

## 2019-02-10 DIAGNOSIS — D571 Sickle-cell disease without crisis: Secondary | ICD-10-CM

## 2019-02-11 ENCOUNTER — Other Ambulatory Visit: Payer: Self-pay | Admitting: Primary Care

## 2019-02-11 DIAGNOSIS — D571 Sickle-cell disease without crisis: Secondary | ICD-10-CM

## 2019-02-11 NOTE — Telephone Encounter (Signed)
Cameron BachelorLucius Dimino - 96- 25 Prescriptions  Confidential Drug Report  Search Terms: Cameron BachelorLucius Speros, [birth year]   Search Date: 02/11/2019 07:51:41 AM   Searching on behalf of: [id number] - Darol DestineHeather Jeannine [name]   The Drug Utilization Report below displays all of the controlled substance prescriptions, if any, that your patient has filled in the last twelve months. The information displayed on this report is compiled from pharmacy submissions to the Department, and accurately reflects the information as submitted by the pharmacies.  This report was requested by: Nolberto HanlonJamie Sandip Power   Reference #: [id number]   Others' Prescriptions  Patient Name: Cameron [name]   Birth Date: [birth year]   Address: [address] [place], [place] [id number]   Sex: Male   Rx Written Rx Dispensed Drug Quantity Days Supply Prescriber Name Payment Method Dispenser   01/26/2019 01/26/2019 oxycodone hcl 10 mg tablet  84 14 [name], Gwenith Spitziffany L MD [facility]   01/09/2019 01/10/2019 oxycodone hcl 10 mg tablet  84 14 [name], Gwenith Spitziffany L MD [facility]   12/23/2018 12/24/2018 oxycodone hcl 10 mg tablet  84 14 [name], Gwenith Spitziffany L MD [facility]   12/09/2018 12/10/2018 oxycodone hcl 10 mg tablet  84 14 [name], Gwenith Spitziffany L MD [facility]   11/24/2018 11/26/2018 oxycodone hcl 10 mg tablet  84 14 [name], Gwenith Spitziffany L MD [facility]   11/14/2018 11/14/2018 oxycodone hcl 10 mg tablet  84 14 Roselind Rilyeinhardt, Kristine M [facility]   10/31/2018 11/01/2018 oxycodone hcl 10 mg tablet  84 14 [name], Gwenith Spitziffany L MD [facility]   10/16/2018 10/16/2018 oxycodone hcl 10 mg tablet  84 14 Roselind Rilyeinhardt, Kristine M [facility]   09/26/2018 09/29/2018 oxycodone hcl 10 mg tablet  84 14 [name], [name] [facility]   09/04/2018 09/11/2018 oxycodone hcl 10  mg tablet  84 14 Roselind Rilyeinhardt, Kristine M [facility]   09/03/2018 09/04/2018 oxycodone hcl 10 mg tablet  40 20 Roselind Rilyeinhardt, Kristine M [facility]   08/20/2018 08/21/2018 oxycodone hcl 10 mg tablet  84 14 [name], Gwenith Spitziffany L MD [facility]   08/07/2018 08/07/2018 oxycodone hcl 10 mg tablet  84 14 [name], Gwenith Spitziffany L MD [facility]   07/23/2018 07/24/2018 oxycodone hcl 10 mg tablet  84 14 [name], Gwenith Spitziffany L MD [facility]   07/08/2018 07/11/2018 oxycodone hcl 10 mg tablet  84 14 Roselind Rilyeinhardt, Kristine M [facility]   06/27/2018 06/27/2018 oxycodone hcl 10 mg tablet  84 14 Roselind Rilyeinhardt, Kristine M [facility]   06/11/2018 06/13/2018 oxycodone hcl 10 mg tablet  84 14 [name], Gwenith Spitziffany L MD [facility]   05/30/2018 05/30/2018 oxycodone hcl 10 mg tablet  84 14 Roselind Rilyeinhardt, Kristine M [facility]   05/14/2018 05/14/2018 oxycodone hcl 10 mg tablet  84 14 [name], Gwenith Spitziffany L MD [facility]   04/30/2018 04/30/2018 oxycodone hcl 10 mg tablet  84 14 [name], Gwenith Spitziffany L MD [facility]   04/09/2018 04/09/2018 oxycodone hcl 10 mg tablet  84 14 Roselind Rilyeinhardt, Kristine M [facility]   03/25/2018 03/25/2018 oxycodone hcl 10  mg tablet  80 14 [name] [facility]   03/12/2018 03/12/2018 oxycodone hcl 10 mg tablet  80 13 [name], [name] MD Insurance [facility]   02/25/2018 02/25/2018 oxycodone hcl 10 mg tablet  80 13 [name], [name] MD [facility]   02/13/2018 02/13/2018 oxycodone hcl 10 mg tablet  80 13 [name], [name] Insurance [facility]

## 2019-02-11 NOTE — Telephone Encounter (Signed)
Writer spoke to patient's mom, French Ana. She reports patient is currently out of medication and has requested it gets approved today. French Ana would like a call back once medication has been approved.     Provider paged.

## 2019-02-12 ENCOUNTER — Other Ambulatory Visit: Payer: Self-pay | Admitting: Primary Care

## 2019-02-12 DIAGNOSIS — D571 Sickle-cell disease without crisis: Secondary | ICD-10-CM

## 2019-02-12 NOTE — Telephone Encounter (Signed)
Patient's mother reported the patient has been out of medication for 3 days. Writer stated provider would be paged and patient's mother stated "well you said that yesterday, and if it is not sent in by noon today I am going to call back and speak with the supervisor".    Provider paged.

## 2019-02-12 NOTE — Telephone Encounter (Signed)
Writer spoke to patient's mom. Patient's mom is requesting this gets sent in today. Patient's mom states she will be calling back at 2 pm.

## 2019-02-13 ENCOUNTER — Ambulatory Visit: Payer: Medicaid Other | Admitting: Primary Care

## 2019-02-13 ENCOUNTER — Encounter: Payer: Self-pay | Admitting: Primary Care

## 2019-02-13 MED ORDER — OXYCODONE HCL 10 MG PO TABS *I*
10.0000 mg | ORAL_TABLET | ORAL | 0 refills | Status: DC | PRN
Start: 2019-02-13 — End: 2019-02-18

## 2019-02-13 NOTE — Addendum Note (Signed)
Addended byJannet Mantis on: 02/13/2019 12:44 PM     Modules accepted: Orders

## 2019-02-13 NOTE — Telephone Encounter (Signed)
Patient did not attend visit this morning - I was on link for 20 min and tried calling with no answer  Will provide bridge to Tuesday and I can see him at 12 or 1

## 2019-02-13 NOTE — Telephone Encounter (Signed)
Addressed in refill encounter dated 02/09/2019.  Patient is rescheduled for visit with Dr Concha Norway on 02/17/2019 at 12p

## 2019-02-13 NOTE — Telephone Encounter (Signed)
Writer spoke to patient's mom, French Ana.   Writer routed call to Engineer, manufacturing.

## 2019-02-13 NOTE — Telephone Encounter (Signed)
Error

## 2019-02-13 NOTE — Telephone Encounter (Signed)
Spoke to Cameron [name], notified her of the 5 day script to get Pace to his appointment with Dr. Demetrius Charity on Tuesday at 12pm. Also that I will follow up with clear appointment information so we don't run into this issue again.

## 2019-02-13 NOTE — Telephone Encounter (Signed)
Verified with patients insurance company that a 14 day supply will not need a prior authorization, nor will a 30 day supply. However if a 30 day supply is ordered, clinical justification may be required due to 4 opioid orders within a month.

## 2019-02-13 NOTE — Telephone Encounter (Addendum)
I Spoke to Mom at roughly, 3 minutes after the appointment time Emanuel was waiting in the room for the provider. I asked they continue to wait as She may be behind in another visit. I stated to call back if they are waiting more than 15 minutes. I will call and speak to Mom again.     What do you want to do regarding the medication?

## 2019-02-13 NOTE — Telephone Encounter (Signed)
Writer spoke to patient's mom, French Ana. French Ana reports patient has been waiting 40 minutes to the video appointment and provider never showed. French Ana stated provider could have called patient. French Ana states patient needs this medication. French Ana has requested to speak to a supervisor.     Phone: [phone]

## 2019-02-17 ENCOUNTER — Ambulatory Visit: Payer: Medicaid Other | Admitting: Primary Care

## 2019-02-17 ENCOUNTER — Encounter: Payer: Self-pay | Admitting: Primary Care

## 2019-02-17 DIAGNOSIS — D564 Hereditary persistence of fetal hemoglobin [HPFH]: Secondary | ICD-10-CM

## 2019-02-17 DIAGNOSIS — D571 Sickle-cell disease without crisis: Secondary | ICD-10-CM

## 2019-02-17 DIAGNOSIS — K5190 Ulcerative colitis, unspecified, without complications: Secondary | ICD-10-CM

## 2019-02-17 DIAGNOSIS — E611 Iron deficiency: Secondary | ICD-10-CM

## 2019-02-17 NOTE — Progress Notes (Signed)
[facility]  [address]  [place] [id number]  Phone: [phone]  Fax: [phone]    CHIEF COMPLAINT     Chief Complaint   Patient presents with    Sickle Cell Pain Crisis       TELEMEDICINE CONSENT     Visit being conducted by Telephone in lieu of a face to face visit to minimize health care worker and patient exposure to COVID-19, and conserve PPE.    Location of Patient: home  Location of Telemedicine Provider: home office  Other Participants in telemedicine encounter and roles: none    Consent was obtained from the patient to complete this telemedicine visit; including the potential for financial liability.  How did the patient provide consent? Verbal Consent Only    PRIMARY DIAGNOSIS   Sickle Cell disease, type  HgSS    SUBJECTIVE   Sickle Cell recurrent pain in back and legs and Ulcerative colitis:   Onset of change in pain: beginning of last month the pain has been worse   Location of pain: back   Inciting events: change in weather    Interventions at home: taking 5 tab of oxy IR per day and new job working for [employer] at [employer]   Drinking a lot of water and gatorade - clear urine    Having stools once a day taking medications once a day    Stools are brown/green and about 1-2 per day    Current home management:   Non pharmacologic interventions:rest and oral hydration   Non opioid pain modifying medications: ibuprofen  and tylenol    Short acting Opioid pain medications:oxycodone 10 mg Q 4 hr   Long acting Opioid pain medications: None    Emergency Room visits for this event: 0  Hospitalizations for this event: 0    Current disease modifying therapy: none    Medications Reviewed and changes     Current Outpatient Medications:     oxyCODONE (ROXICODONE) 10 MG immediate release tablet, Take 1 tablet (10 mg total) by mouth every 4 hours as needed for Pain Max daily dose: 60 mg, Disp: 30 tablet, Rfl: 0    mesalamine (ASACOL HD) 800 MG tablet, Take 3 tablets (2,400  mg total) by mouth 2 times daily, Disp: 180 tablet, Rfl: 2    ergocalciferol (ERGOCALCIFEROL) 50000 UNIT capsule, Take 1 capsule (50,000 Units total) by mouth once a week, Disp: 4 capsule, Rfl: 5    diclofenac (VOLTAREN) 1 % gel, Apply 4 g to affected area 4 times daily., Disp: 100 g, Rfl: 2    Elastic Bandages & Supports (ANKLE WRAP) MISC, Use PRN for right ankle. (Patient not taking: Reported on 11/05/2018), Disp: 2 each, Rfl: 0    folic acid (FOLVITE) 1 MG tablet, Take 1 tablet (1 mg total) by mouth daily, Disp: 30 tablet, Rfl: 5    ferrous sulfate 325 (65 FE) MG tablet, Take 1 tablet (325 mg total) by mouth 2 times daily (with meals), Disp: 100 tablet, Rfl: 3    acetaminophen (TYLENOL) 500 mg tablet, Take 500 mg by mouth every 6 hours as needed, Disp: , Rfl:     cyclobenzaprine (FLEXERIL) 10 MG tablet, Take 1 tablet (10 mg total) by mouth 3 times daily as needed for Muscle spasms (Patient not taking: Reported on 09/02/2018), Disp: 45 tablet, Rfl: 0    naloxone (NARCAN) 4 mg/0.1 mL nasal spray, Instill 1 spray in 1 nostril once for opioid reversal. Repeat in alternating  nostrils with new package every 2-3 minutes until response, Disp: 2 each, Rfl: 5  Review of Systems    OBJECTIVE   PHYSICAL EXAM  This visit was performed during a pandemic event, and the physical exam was limited to my Video observation of this patient's organ systems and/or body areas.   GEN:  alert, looks comfortable          Lab results: 11/06/18  1618   WBC 4.8   Hemoglobin 5.4*   Hematocrit 21*   RBC 3.3*   Platelets 436*         ASSESSMENT / DIAGNOSIS     Cameron [name] was seen today for sickle cell pain and ulcerative colitis    Diagnoses and all orders for this visit:    Sickle cell disease with HPFH:  We discussed given the frequency of management with opiate pain medication he should first attend to iron deficiency secondary to UC that may be contirbuting and then will need to collaborate with hematology and gastroenterology and assess  if HU indicated  Offered PT for support of management of back pain as largely activity triggered- increases with periods of severe anemia so appears to be related to his SCD   -     Cancel: Iron; Future  -     Ferritin; Future  -     TIBC; Future  -     CBC and differential; Future  -     Reticulocytes; Future    Ulcerative colitis  Discussed continuation of current therapies of mesalamine and importance of BID dosing - strategies discussed to managing  Needs GI follow up  intermittent vitamin supplementation due to lack of insurance coverage    Iron deficiency  Encouraged patient to call to schedule next infusion, orders are in but has only has one dose in Feb 2020 with vendofer       Pain management treatment plan   Patient has co diagnosis of ulcerative colitis dx age 23.  In addition Blood type O+ with antibodies anti-fya,jkb,lea  Outpatient Pain Regimen:  Long acting: none  Short acting; Oxy IR 10mg  Q4 hrs prn pain  Home Based titration regimen: none, needs assessment due to uncontrolled UC0  Adjunct therapies: none, holding NSAID due to ongoing LGI bleeding  Other: none    [facility]    ED/Infusion Center care plan recommendations:  Toradol 30 mg IV Q6 hrs ATC  NS 2 L IV bolus  Transfuse for HcT <18  Hydromorphone 2 mg IV Q30 min prn pain score>5  Recommend hospitalization is pain score remains >5 after 3 doses of Hydromorphone 2 mg IV    Labs on arrival: CBC, retic, CHEM14  CXR if complaints of chest pain to evaluate for infiltrate consistent with acute chest syndrome  Assess for need for bronchodilator if complaints of chest pain    Inpatient care plan recommendations:  Toradol 30 mg IV Q6 hrs ATC  NS bolus until euvolemic followed by D51/2NS at 125 cc/hr until tolerating PO well  PCA: Start with demand only Hydromorophone PCA 1 mg Q20 min with lockout 12 mg over 4 hrs Recommend titration of pain regimen until achieving pain score <5     Prior to discharge recommend 24 hours of PO  pain medications to assess for sx of withdrawal with transition and continued pain control to prevent readmission    Transfuse if HcT< 18 and consider GI consult as patient's most recent cause for profound anemia is due to  UC    Venofer orders in Dr Concha Norway      --Patient instructed to call if symptoms are not improving or worsening      Electronically signed by Benay Pillow, MD 02/17/2019   12:28 PM  [facility], Phone: [phone]      21+ minutes was spent with the patient, patient representatives, and/or other attendees.       Billing codes for telephone visits,Routine E&M codes can be used for video visits  99441 5-10 minutes  99442 11-20 minutes  99443 > 20 minutes

## 2019-02-18 ENCOUNTER — Other Ambulatory Visit: Payer: Self-pay | Admitting: Primary Care

## 2019-02-18 DIAGNOSIS — D571 Sickle-cell disease without crisis: Secondary | ICD-10-CM

## 2019-02-18 MED ORDER — OXYCODONE HCL 10 MG PO TABS *I*
10.0000 mg | ORAL_TABLET | ORAL | 0 refills | Status: DC | PRN
Start: 2019-02-18 — End: 2019-02-27

## 2019-02-18 NOTE — Telephone Encounter (Signed)
Writer spoke to patient. Patient has requested this medication gets dispensed today to preferred pharmacy.       earch TermsKeenan [name]: Cameron [name], 1996-08-21   Search Date: 02/18/2019 15:55:12 PM   Searching on behalf of: [id number] - Darol DestineHeather Jeannine [name]   The Drug Utilization Report below displays all of the controlled substance prescriptions, if any, that your patient has filled in the last twelve months. The information displayed on this report is compiled from pharmacy submissions to the Department, and accurately reflects the information as submitted by the pharmacies.  This report was requested by: Nolberto HanlonJamie Damel Querry   Reference #: [id number]   Others' Prescriptions  Patient Name: Cameron BachelorLucius [name]   Birth Date: 1996-08-21   Address[place] [address] [place], [place] [id number]   Sex: Male   Rx Written Rx Dispensed Drug Quantity Days Supply Prescriber Name Payment Method Dispenser   02/13/2019 02/13/2019 oxycodone hcl 10 mg tablet  30 5 [name], Gwenith Spitziffany L MD [facility]   01/26/2019 01/26/2019 oxycodone hcl 10 mg tablet  84 14 [name], Gwenith Spitziffany L MD [facility]   01/09/2019 01/10/2019 oxycodone hcl 10 mg tablet  84 14 [name], Gwenith Spitziffany L MD [facility]   12/23/2018 12/24/2018 oxycodone hcl 10 mg tablet  84 14 [name], Gwenith Spitziffany L MD [facility]   12/09/2018 12/10/2018 oxycodone hcl 10 mg tablet  84 14 [name], Gwenith Spitziffany L MD [facility]   11/24/2018 11/26/2018 oxycodone hcl 10 mg tablet  84 14 [name], Gwenith Spitziffany L MD [facility]   11/14/2018 11/14/2018 oxycodone hcl 10 mg tablet  84 14 Roselind Rilyeinhardt, Kristine M [facility]   10/31/2018 11/01/2018 oxycodone hcl 10 mg tablet  84 14 [name], Gwenith Spitziffany L MD [facility]   10/16/2018 10/16/2018 oxycodone hcl 10 mg tablet  84 14 Roselind Rilyeinhardt, Kristine M [facility]  [place] [facility]   09/26/2018 09/29/2018 oxycodone hcl 10 mg tablet  84 14 [name], [name] Cash [facility]   09/04/2018 09/11/2018 oxycodone hcl 10 mg tablet  84 14 Roselind Rilyeinhardt, Kristine M [facility]   09/03/2018 09/04/2018 oxycodone hcl 10 mg tablet  40 20 Roselind Rilyeinhardt, Kristine M [facility]   08/20/2018 08/21/2018 oxycodone hcl 10 mg tablet  84 14 [name], Gwenith Spitziffany L MD [facility]   08/07/2018 08/07/2018 oxycodone hcl 10 mg tablet  84 14 [name], Gwenith Spitziffany L MD [facility]   07/23/2018 07/24/2018 oxycodone hcl 10 mg tablet  84 14 [name], Gwenith Spitziffany L MD [facility]   07/08/2018 07/11/2018 oxycodone hcl 10 mg tablet  84 14 Roselind Rilyeinhardt, Kristine M [facility]   06/27/2018 06/27/2018 oxycodone hcl 10 mg tablet  84 14 Roselind Rilyeinhardt, Kristine M [facility]   06/11/2018 06/13/2018 oxycodone hcl 10 mg tablet  84 14 [name], Gwenith Spitziffany L MD [facility]   05/30/2018 05/30/2018 oxycodone hcl 10 mg tablet  84 14 Roselind Rilyeinhardt, Kristine M [facility]   05/14/2018 05/14/2018 oxycodone hcl 10 mg tablet  84 14 [name], Gwenith Spitziffany L MD [facility]   04/30/2018 04/30/2018 oxycodone hcl 10 mg tablet  84 14 [name], [name] Insurance  [facility]   04/09/2018 04/09/2018 oxycodone hcl 10 mg tablet  84 14 [name] [facility]   03/25/2018 03/25/2018 oxycodone hcl 10 mg tablet  80 14 [name] [facility]   03/12/2018 03/12/2018 oxycodone hcl 10 mg tablet  80 13 [name], [name] MD Insurance [facility]   02/25/2018 02/25/2018 oxycodone hcl 10 mg tablet  80 13 [name], [name] Insurance [facility] [place] [facility] [place] [facility]

## 2019-02-19 ENCOUNTER — Encounter: Payer: Self-pay | Admitting: Gastroenterology

## 2019-02-21 ENCOUNTER — Encounter: Payer: Self-pay | Admitting: Primary Care

## 2019-02-23 ENCOUNTER — Other Ambulatory Visit
Admission: RE | Admit: 2019-02-23 | Discharge: 2019-02-23 | Disposition: A | Discharge status: Home / Self Care | Payer: Medicaid Other | Source: Ambulatory Visit | Attending: Primary Care | Admitting: Primary Care

## 2019-02-23 DIAGNOSIS — D571 Sickle-cell disease without crisis: Secondary | ICD-10-CM

## 2019-02-23 DIAGNOSIS — D564 Hereditary persistence of fetal hemoglobin [HPFH]: Secondary | ICD-10-CM | POA: Insufficient documentation

## 2019-02-23 LAB — TIBC
Iron: 24 ug/dL — ABNORMAL LOW (ref 45–170)
TIBC: 417 ug/dL (ref 250–450)
Transferrin Saturation: 6 % — ABNORMAL LOW (ref 20–55)

## 2019-02-23 LAB — CBC AND DIFFERENTIAL
Baso # K/uL: 0.1 10*3/uL (ref 0.0–0.1)
Basophil %: 0.9 %
Eos # K/uL: 0.7 10*3/uL — ABNORMAL HIGH (ref 0.0–0.5)
Eosinophil %: 9.6 %
Hematocrit: 21 % — ABNORMAL LOW (ref 40–51)
Hemoglobin: 5.9 g/dL — ABNORMAL LOW (ref 13.7–17.5)
IMM Granulocytes #: 0 10*3/uL (ref 0.0–0.0)
IMM Granulocytes: 0.3 %
Lymph # K/uL: 1.7 10*3/uL (ref 1.3–3.6)
Lymphocyte %: 22.4 %
MCH: 17 pg/cell — ABNORMAL LOW (ref 26–32)
MCHC: 28 g/dL — ABNORMAL LOW (ref 32–37)
MCV: 60 fL — ABNORMAL LOW (ref 79–92)
Mono # K/uL: 0.6 10*3/uL (ref 0.3–0.8)
Monocyte %: 8.1 %
Neut # K/uL: 4.4 10*3/uL (ref 1.8–5.4)
Nucl RBC # K/uL: 0 10*3/uL (ref 0.0–0.0)
Nucl RBC %: 0 /100 WBC (ref 0.0–0.2)
Platelets: 255 10*3/uL (ref 150–330)
RBC: 3.5 MIL/uL — ABNORMAL LOW (ref 4.6–6.1)
RDW: 22.6 % — ABNORMAL HIGH (ref 11.6–14.4)
Seg Neut %: 58.7 %
WBC: 7.5 10*3/uL (ref 4.2–9.1)

## 2019-02-23 LAB — RETICULOCYTES
Retic %: 1.3 % (ref 0.7–2.3)
Retic Abs: 44.6 10*3/uL (ref 33.8–124.0)

## 2019-02-23 LAB — RBC MORPHOLOGY

## 2019-02-23 LAB — FERRITIN: Ferritin: 11 ng/mL — ABNORMAL LOW (ref 20–250)

## 2019-02-25 ENCOUNTER — Other Ambulatory Visit: Payer: Self-pay | Admitting: Primary Care

## 2019-02-25 DIAGNOSIS — D571 Sickle-cell disease without crisis: Secondary | ICD-10-CM

## 2019-02-25 NOTE — Telephone Encounter (Signed)
Confidential Drug Report  Search Terms: Cameron [name], 05-23-1996   Search Date: 02/25/2019 10:20:31 AM   Searching on behalf of: [id number] - Karle BarrAriadne G Lie   The Drug Utilization Report below displays all of the controlled substance prescriptions, if any, that your patient has filled in the last twelve months. The information displayed on this report is compiled from pharmacy submissions to the Department, and accurately reflects the information as submitted by the pharmacies.  This report was requested by: Alvira PhilipsHannah Neftali Thurow   Reference #: [id number]   Others' Prescriptions  Patient Name: Cameron [name]   Birth Date: 05-23-1996   Address[place] [address] [place], [place] [address]   Sex: Male   Rx Written Rx Dispensed Drug Quantity Days Supply Prescriber Name Payment Method Dispenser   02/18/2019 02/19/2019 oxycodone hcl 10 mg tablet  30 5 [name], Gwenith Spitziffany L MD [facility]   02/13/2019 02/13/2019 oxycodone hcl 10 mg tablet  30 5 [name], Gwenith Spitziffany L MD [facility]   01/26/2019 01/26/2019 oxycodone hcl 10 mg tablet  84 14 [name], Gwenith Spitziffany L MD [facility]   01/09/2019 01/10/2019 oxycodone hcl 10 mg tablet  84 14 [name], Gwenith Spitziffany L MD [facility]   12/23/2018 12/24/2018 oxycodone hcl 10 mg tablet  84 14 [name], Gwenith Spitziffany L MD [facility]   12/09/2018 12/10/2018 oxycodone hcl 10 mg tablet  84 14 [name], [name] [facility]   11/24/2018 11/26/2018 oxycodone hcl 10 mg tablet  84 14 [name], Gwenith Spitziffany L MD [facility]   11/14/2018 11/14/2018 oxycodone hcl 10 mg tablet  84 14 Roselind Rilyeinhardt, Kristine M [facility]   10/31/2018 11/01/2018 oxycodone hcl 10 mg tablet  84 14 [name], Gwenith Spitziffany L MD [facility]   10/16/2018 10/16/2018 oxycodone hcl 10 mg tablet  84 14 Roselind Rilyeinhardt, Kristine M  [facility]   09/26/2018 09/29/2018 oxycodone hcl 10 mg tablet  84 14 [name], [name] Cash [facility]   09/04/2018 09/11/2018 oxycodone hcl 10 mg tablet  84 14 Roselind Rilyeinhardt, Kristine M [facility]   09/03/2018 09/04/2018 oxycodone hcl 10 mg tablet  40 20 Roselind Rilyeinhardt, Kristine M [facility]   08/20/2018 08/21/2018 oxycodone hcl 10 mg tablet  84 14 [name], Gwenith Spitziffany L MD [facility]   08/07/2018 08/07/2018 oxycodone hcl 10 mg tablet  84 14 [name], Gwenith Spitziffany L MD [facility]   07/23/2018 07/24/2018 oxycodone hcl 10 mg tablet  84 14 [name], Gwenith Spitziffany L MD [facility]   07/08/2018 07/11/2018 oxycodone hcl 10 mg tablet  84 14 Roselind Rilyeinhardt, Kristine M [facility]   06/27/2018 06/27/2018 oxycodone hcl 10 mg tablet  84 14 Roselind Rilyeinhardt, Kristine M [facility]   06/11/2018 06/13/2018 oxycodone hcl 10 mg tablet  84 14 [name], Gwenith Spitziffany L MD [facility]   05/30/2018 05/30/2018 oxycodone hcl 10 mg tablet  84 14 Roselind Rilyeinhardt, Kristine M [facility]   05/14/2018 05/14/2018 oxycodone hcl 10 mg tablet  84 14 [name], Gwenith Spitziffany L MD [facility]   04/30/2018 04/30/2018 oxycodone hcl 10 mg tablet  84 14  Benay Pillow MD [facility]   04/09/2018 04/09/2018 oxycodone hcl 10 mg tablet  84 14 [name] [facility]   03/25/2018 03/25/2018 oxycodone hcl 10 mg tablet  80 14 [name] [facility]   03/12/2018 03/12/2018 oxycodone hcl 10 mg tablet  80 13 [name], [name] MD [facility]   02/25/2018 02/25/2018 oxycodone hcl 10 mg tablet  80 13 [name], [name] Insurance [facility]   * - Drugs marked with an asterisk are compound drugs. If the compound drug is made up of more than one controlled substance, then each controlled substance will be a separate row in the table.

## 2019-02-26 ENCOUNTER — Other Ambulatory Visit: Payer: Self-pay | Admitting: Primary Care

## 2019-02-26 DIAGNOSIS — D571 Sickle-cell disease without crisis: Secondary | ICD-10-CM

## 2019-02-26 NOTE — Telephone Encounter (Signed)
Patient's mother called and requested clarification on patient's pain medication.   Caller stated patient requested his medication "Monday or a few days ago". Patient requested medication yesterday, 6/3. Writer explained the 3 day policy, French Ana was understanding.   Caller stated that PCP usually sends in 84 tablets, but only sent 30 in this time.     French Ana reported patient is completely out of medication.

## 2019-02-26 NOTE — Telephone Encounter (Signed)
Confidential Drug Report  Search Terms: Cameron BachelorLucius Chaikin, [birth year]   Search Date: 02/26/2019 13:09:09 PM   Searching on behalf of: [id number] - Darol DestineHeather Jeannine [name]   The Drug Utilization Report below displays all of the controlled substance prescriptions, if any, that your patient has filled in the last twelve months. The information displayed on this report is compiled from pharmacy submissions to the Department, and accurately reflects the information as submitted by the pharmacies.  This report was requested by: Nolberto HanlonJamie Fancy Dunkley   Reference #: [id number]   Others' Prescriptions  Patient Name: Cameron [name]   Birth Date: [birth year]   Address: [address] [place], [place] [address]   Sex: Male   Rx Written Rx Dispensed Drug Quantity Days Supply Prescriber Name Payment Method Dispenser   02/18/2019 02/19/2019 oxycodone hcl 10 mg tablet  30 5 [name], Gwenith Spitziffany L MD [facility]   02/13/2019 02/13/2019 oxycodone hcl 10 mg tablet  30 5 [name], Gwenith Spitziffany L MD [facility]   01/26/2019 01/26/2019 oxycodone hcl 10 mg tablet  84 14 [name], Gwenith Spitziffany L MD [facility]   01/09/2019 01/10/2019 oxycodone hcl 10 mg tablet  84 14 [name], Gwenith Spitziffany L MD [facility]   12/23/2018 12/24/2018 oxycodone hcl 10 mg tablet  84 14 [name], Gwenith Spitziffany L MD [facility]   12/09/2018 12/10/2018 oxycodone hcl 10 mg tablet  84 14 [name], [name] [facility]   11/24/2018 11/26/2018 oxycodone hcl 10 mg tablet  84 14 [name], Gwenith Spitziffany L MD [facility]   11/14/2018 11/14/2018 oxycodone hcl 10 mg tablet  84 14 Roselind Rilyeinhardt, Kristine M [facility]   10/31/2018 11/01/2018 oxycodone hcl 10 mg tablet  84 14 [name], Gwenith Spitziffany L MD [facility]   10/16/2018 10/16/2018 oxycodone hcl 10 mg tablet  84 14 Roselind Rilyeinhardt,  Kristine M [facility]   09/26/2018 09/29/2018 oxycodone hcl 10 mg tablet  84 14 [name], [name] [facility]   09/04/2018 09/11/2018 oxycodone hcl 10 mg tablet  84 14 Roselind Rilyeinhardt, Kristine M [facility]   09/03/2018 09/04/2018 oxycodone hcl 10 mg tablet  40 20 Roselind Rilyeinhardt, Kristine M [facility]   08/20/2018 08/21/2018 oxycodone hcl 10 mg tablet  84 14 [name], Gwenith Spitziffany L MD [facility]   08/07/2018 08/07/2018 oxycodone hcl 10 mg tablet  84 14 [name], Gwenith Spitziffany L MD [facility]   07/23/2018 07/24/2018 oxycodone hcl 10 mg tablet  84 14 [name], Gwenith Spitziffany L MD [facility]   07/08/2018 07/11/2018 oxycodone hcl 10 mg tablet  84 14 Roselind Rilyeinhardt, Kristine M [facility]   06/27/2018 06/27/2018 oxycodone hcl 10 mg tablet  84 14 Roselind Rilyeinhardt, Kristine M [facility]   06/11/2018 06/13/2018 oxycodone hcl 10 mg tablet  84 14 [name], Gwenith Spitziffany L MD [facility]   05/30/2018 05/30/2018 oxycodone hcl 10 mg tablet  84 14 Roselind Rilyeinhardt, Kristine M [facility]   05/14/2018 05/14/2018 oxycodone hcl 10 mg tablet  84 14 [name], Gwenith Spitziffany L MD [facility]   04/30/2018 04/30/2018 oxycodone hcl 10 mg tablet  84 14  Benay Pillow MD [facility]   04/09/2018 04/09/2018 oxycodone hcl 10 mg tablet  84 14 [name] [facility]   03/25/2018 03/25/2018 oxycodone hcl 10 mg tablet  80 14 [name] [facility]   03/12/2018 03/12/2018 oxycodone hcl 10 mg tablet  80 13 [name], [name] [facility]

## 2019-02-27 MED ORDER — OXYCODONE HCL 10 MG PO TABS *I*
10.0000 mg | ORAL_TABLET | ORAL | 0 refills | Status: DC | PRN
Start: 2019-02-27 — End: 2019-03-31

## 2019-02-27 NOTE — Telephone Encounter (Signed)
Spoke with Cameron [name]  Aware that a month supply of pain medication was sent in  [place] over that his last reports was that he is taking 5 tablets a day at a max  Pt confirmed that this is still the case    Writer informed pt that he is booked on 6/9 for a video visit with PCP to fu on labs and pain  Pt verbalized understanding    Writer then discussed that we are no longer accepting calls from his mother for refill requests  Writer informed pt that his mother called the office this morning and was very threatening to the front desk staff  Writer asked that he relay to his mother that he has received his refill and will be calling from now on    Per MD: if mom calls again, staff can direct her to call her son for information and end the phone call

## 2019-02-27 NOTE — Telephone Encounter (Signed)
Refill due 30 day supply sent   Averaging 5 tab a day so wrote 150 for the whole month  Needs f/u to discuss labs can book Tues 4:30 via video

## 2019-02-27 NOTE — Telephone Encounter (Signed)
Patient's mother called [facility] to find out if patient's medication had been sent. Writer relayed that patient has this information and she should call him for an update.   Patient's mother stated she will call him.

## 2019-02-27 NOTE — Telephone Encounter (Signed)
Writer spoke to patient's mother French Ana. She reports if this medication is not approved she will be calling back at 1 and will demand to speak to a supervisor. French Ana would also like to switch providers.    Writer paged provider. French Ana wants a call back if the medication is approved before 1.     Phone: [phone]

## 2019-03-03 ENCOUNTER — Encounter: Payer: Self-pay | Admitting: Primary Care

## 2019-03-03 ENCOUNTER — Ambulatory Visit: Payer: Medicaid Other | Admitting: Primary Care

## 2019-03-17 ENCOUNTER — Ambulatory Visit: Payer: Medicaid Other | Admitting: Internal Medicine

## 2019-03-17 NOTE — Progress Notes (Deleted)
[facility]  [address]   [place] [address]  Phone: [phone]  Fax: [phone]    CHIEF COMPLAINT   No chief complaint on file.      TELEMEDICINE CONSENT     Visit being conducted by {Telephone/Video:28939} in lieu of a face to face visit to minimize health care worker and patient exposure to COVID-19, and conserve PPE.    {Location of Patient:1603000}  {Location of Telemedicine Provider:28918}  Other Participants in telemedicine encounter and roles: ***    Consent was obtained from the patient to complete this telemedicine visit; including the potential for financial liability.  How did the patient provide consent? {ccctelemedconsent:26916}    PRIMARY DIAGNOSIS   Sickle Cell disease, type  {[facility] Sickle Cell MVHQ:46962}    SUBJECTIVE   Sickle Cell vaso-occlusive crisis   Onset of change in pain: ***   Location of pain: ***   Inciting events: ***   Interventions at home: ***    Current home management:   Non pharmacologic interventions:{cccscdnonmed:24914}   Non opioid pain modifying medications: {cccscdnonopioid:24915}   Short acting Opioid pain medications:{cccscdshortacting:24917}*** mg Q *** hr   Long acting Opioid pain medications: {cccscdlongacting:24919}*** mg Q *** hr      Current Pain score: There were no vitals filed for this visit.    Emergency Room visits for this event: {gen number 9-52:841324}  Hospitalizations for this event: {gen number 4-01:027253}    Current disease modifying therapy: {[facility] Sickle Cell Dis Mod Therapy:28945}    Medications Reviewed and changes     Current Outpatient Medications:     oxyCODONE (ROXICODONE) 10 MG immediate release tablet, Take 1 tablet (10 mg total) by mouth every 4 hours as needed for Pain Max daily dose: 60 mg, Disp: 150 tablet, Rfl: 0    mesalamine (ASACOL HD) 800 MG tablet, Take 3 tablets (2,400 mg total) by mouth 2 times daily, Disp: 180 tablet, Rfl: 2    ergocalciferol (ERGOCALCIFEROL) 50000 UNIT capsule, Take 1 capsule  (50,000 Units total) by mouth once a week, Disp: 4 capsule, Rfl: 5    diclofenac (VOLTAREN) 1 % gel, Apply 4 g to affected area 4 times daily., Disp: 100 g, Rfl: 2    Elastic Bandages & Supports (ANKLE WRAP) MISC, Use PRN for right ankle. (Patient not taking: Reported on 11/05/2018), Disp: 2 each, Rfl: 0    folic acid (FOLVITE) 1 MG tablet, Take 1 tablet (1 mg total) by mouth daily, Disp: 30 tablet, Rfl: 5    ferrous sulfate 325 (65 FE) MG tablet, Take 1 tablet (325 mg total) by mouth 2 times daily (with meals), Disp: 100 tablet, Rfl: 3    acetaminophen (TYLENOL) 500 mg tablet, Take 500 mg by mouth every 6 hours as needed, Disp: , Rfl:     cyclobenzaprine (FLEXERIL) 10 MG tablet, Take 1 tablet (10 mg total) by mouth 3 times daily as needed for Muscle spasms (Patient not taking: Reported on 09/02/2018), Disp: 45 tablet, Rfl: 0    naloxone (NARCAN) 4 mg/0.1 mL nasal spray, Instill 1 spray in 1 nostril once for opioid reversal. Repeat in alternating nostrils with new package every 2-3 minutes until response, Disp: 2 each, Rfl: 5  Review of Systems    OBJECTIVE   PHYSICAL EXAM  This visit was performed during a pandemic event, and the physical exam was limited to my {Telephone/Video:28939} observation of this patient's organ systems and/or body areas.   GEN:  alert, looks {mildly/moderately/severely:19575} uncomfortable  {CCCHEENT (  Optional):27221}        Lab results: 02/23/19  1352   WBC 7.5   Hemoglobin 5.9*   Hematocrit 21*   RBC 3.5*   Platelets 255         ASSESSMENT / DIAGNOSIS     There are no diagnoses linked to this encounter.    Pain management treatment plan   Patient has co diagnosis of ulcerative colitis dx age 23.  In addition Blood type O+ with antibodies anti-fya,jkb,lea  Outpatient Pain Regimen:  Long acting: none  Short acting; Oxy IR 10mg  Q4 hrs prn pain  Home Based titration regimen: none, needs assessment due to uncontrolled UC0  Adjunct therapies: none, holding NSAID due to ongoing LGI  bleeding  Other: none    [facility]    ED/Infusion Center care plan recommendations:  Toradol 30 mg IV Q6 hrs ATC  NS 2 L IV bolus  Transfuse for HcT <18  Hydromorphone 2 mg IV Q30 min prn pain score>5  Recommend hospitalization is pain score remains >5 after 3 doses of Hydromorphone 2 mg IV    Labs on arrival: CBC, retic, CHEM14  CXR if complaints of chest pain to evaluate for infiltrate consistent with acute chest syndrome  Assess for need for bronchodilator if complaints of chest pain    Inpatient care plan recommendations:  Toradol 30 mg IV Q6 hrs ATC  NS bolus until euvolemic followed by D51/2NS at 125 cc/hr until tolerating PO well  PCA: Start with demand only Hydromorophone PCA 1 mg Q20 min with lockout 12 mg over 4 hrs Recommend titration of pain regimen until achieving pain score <5     Prior to discharge recommend 24 hours of PO pain medications to assess for sx of withdrawal with transition and continued pain control to prevent readmission    Transfuse if HcT< 18 and consider GI consult as patient's most recent cause for profound anemia is due to UC    Venofer orders in Dr Concha NorwayPulcino    There are no Patient Instructions on file for this visit.    No follow-ups on file.  --Patient instructed to call if symptoms are not improving or worsening      Electronically signed by Jodi GeraldsKristine M , NP 03/17/2019   8:09 AM  [facility], Phone: [phone]      {TIME UJWJX:91478}SPENT:28923} minutes was spent with the patient, patient representatives, and/or other attendees.       Billing codes for telephone visits,Routine E&M codes can be used for video visits  99441 5-10 minutes  99442 11-20 minutes  99443 > 20 minutes

## 2019-03-18 ENCOUNTER — Encounter: Payer: Self-pay | Admitting: Internal Medicine

## 2019-03-20 ENCOUNTER — Encounter: Payer: Self-pay | Admitting: Gastroenterology

## 2019-03-26 ENCOUNTER — Ambulatory Visit: Payer: Medicaid Other | Admitting: Primary Care

## 2019-03-26 ENCOUNTER — Encounter: Payer: Self-pay | Admitting: Internal Medicine

## 2019-03-26 ENCOUNTER — Ambulatory Visit: Payer: Medicaid Other | Admitting: Internal Medicine

## 2019-03-26 NOTE — Progress Notes (Deleted)
[facility]  [address]  [place] [address]  Phone: [phone]  Fax: [phone]    CHIEF COMPLAINT   No chief complaint on file.      TELEMEDICINE CONSENT     Visit being conducted by {Telephone/Video:28939} in lieu of a face to face visit to minimize health care worker and patient exposure to COVID-19, and conserve PPE.    {Location of Patient:1603000}  {Location of Telemedicine Provider:28918}  Other Participants in telemedicine encounter and roles: ***    Consent was obtained from the patient to complete this telemedicine visit; including the potential for financial liability.  How did the patient provide consent? {ccctelemedconsent:26916}    PRIMARY DIAGNOSIS   Sickle Cell disease, type  {[facility] Sickle Cell XLKG:40102}    SUBJECTIVE   Sickle Cell vaso-occlusive crisis   Onset of change in pain: ***   Location of pain: ***   Inciting events: ***   Interventions at home: ***    Current home management:   Non pharmacologic interventions:{cccscdnonmed:24914}   Non opioid pain modifying medications: {cccscdnonopioid:24915}   Short acting Opioid pain medications:{cccscdshortacting:24917}*** mg Q *** hr   Long acting Opioid pain medications: {cccscdlongacting:24919}*** mg Q *** hr    Hydrea:  ***      Current Pain score: There were no vitals filed for this visit.    Emergency Room visits for this event: {gen number 7-25:366440}  Hospitalizations for this event: {gen number 3-47:425956}    Current disease modifying therapy: {[facility] Sickle Cell Dis Mod Therapy:28945}    Medications Reviewed and changes     Current Outpatient Medications:     oxyCODONE (ROXICODONE) 10 MG immediate release tablet, Take 1 tablet (10 mg total) by mouth every 4 hours as needed for Pain Max daily dose: 60 mg, Disp: 150 tablet, Rfl: 0    mesalamine (ASACOL HD) 800 MG tablet, Take 3 tablets (2,400 mg total) by mouth 2 times daily, Disp: 180 tablet, Rfl: 2    ergocalciferol (ERGOCALCIFEROL) 50000 UNIT capsule,  Take 1 capsule (50,000 Units total) by mouth once a week, Disp: 4 capsule, Rfl: 5    diclofenac (VOLTAREN) 1 % gel, Apply 4 g to affected area 4 times daily., Disp: 100 g, Rfl: 2    Elastic Bandages & Supports (ANKLE WRAP) MISC, Use PRN for right ankle. (Patient not taking: Reported on 11/05/2018), Disp: 2 each, Rfl: 0    folic acid (FOLVITE) 1 MG tablet, Take 1 tablet (1 mg total) by mouth daily, Disp: 30 tablet, Rfl: 5    ferrous sulfate 325 (65 FE) MG tablet, Take 1 tablet (325 mg total) by mouth 2 times daily (with meals), Disp: 100 tablet, Rfl: 3    acetaminophen (TYLENOL) 500 mg tablet, Take 500 mg by mouth every 6 hours as needed, Disp: , Rfl:     cyclobenzaprine (FLEXERIL) 10 MG tablet, Take 1 tablet (10 mg total) by mouth 3 times daily as needed for Muscle spasms (Patient not taking: Reported on 09/02/2018), Disp: 45 tablet, Rfl: 0    naloxone (NARCAN) 4 mg/0.1 mL nasal spray, Instill 1 spray in 1 nostril once for opioid reversal. Repeat in alternating nostrils with new package every 2-3 minutes until response, Disp: 2 each, Rfl: 5  Review of Systems    OBJECTIVE   PHYSICAL EXAM  This visit was performed during a pandemic event, and the physical exam was limited to my {Telephone/Video:28939} observation of this patient's organ systems and/or body areas.   GEN:  alert, looks {mildly/moderately/severely:19575} uncomfortable  {CCCHEENT (Optional):27221}        Lab results: 02/23/19  1352   WBC 7.5   Hemoglobin 5.9*   Hematocrit 21*   RBC 3.5*   Platelets 255         ASSESSMENT / DIAGNOSIS     There are no diagnoses linked to this encounter.    Pain management treatment plan   Patient has co diagnosis of ulcerative colitis dx age 23.  In addition Blood type O+ with antibodies anti-fya,jkb,lea  Outpatient Pain Regimen:  Long acting: none  Short acting; Oxy IR 10mg  Q4 hrs prn pain  Home Based titration regimen: none, needs assessment due to uncontrolled UC0  Adjunct therapies: none, holding NSAID due to  ongoing LGI bleeding  Other: none    [facility]    ED/Infusion Center care plan recommendations:  Toradol 30 mg IV Q6 hrs ATC  NS 2 L IV bolus  Transfuse for HcT <18  Hydromorphone 2 mg IV Q30 min prn pain score>5  Recommend hospitalization is pain score remains >5 after 3 doses of Hydromorphone 2 mg IV    Labs on arrival: CBC, retic, CHEM14  CXR if complaints of chest pain to evaluate for infiltrate consistent with acute chest syndrome  Assess for need for bronchodilator if complaints of chest pain    Inpatient care plan recommendations:  Toradol 30 mg IV Q6 hrs ATC  NS bolus until euvolemic followed by D51/2NS at 125 cc/hr until tolerating PO well  PCA: Start with demand only Hydromorophone PCA 1 mg Q20 min with lockout 12 mg over 4 hrs Recommend titration of pain regimen until achieving pain score <5     Prior to discharge recommend 24 hours of PO pain medications to assess for sx of withdrawal with transition and continued pain control to prevent readmission    Transfuse if HcT< 18 and consider GI consult as patient's most recent cause for profound anemia is due to UC    Venofer orders in Dr Concha NorwayPulcino    There are no Patient Instructions on file for this visit.    No follow-ups on file.  --Patient instructed to call if symptoms are not improving or worsening      Electronically signed by Jodi GeraldsKristine M , NP 03/26/2019   1:16 PM  [facility], Phone: [phone]      {TIME UJWJX:91478}SPENT:28923} minutes was spent with the patient, patient representatives, and/or other attendees.       Billing codes for telephone visits,Routine E&M codes can be used for video visits  99441 5-10 minutes  99442 11-20 minutes  99443 > 20 minutes

## 2019-03-30 ENCOUNTER — Other Ambulatory Visit: Payer: Self-pay

## 2019-03-30 ENCOUNTER — Other Ambulatory Visit: Payer: Self-pay | Admitting: Internal Medicine

## 2019-03-30 ENCOUNTER — Other Ambulatory Visit: Payer: Self-pay | Admitting: Primary Care

## 2019-03-30 DIAGNOSIS — D571 Sickle-cell disease without crisis: Secondary | ICD-10-CM

## 2019-03-30 MED ORDER — FOLIC ACID 1 MG PO TABS *I*
1.0000 mg | ORAL_TABLET | Freq: Every day | ORAL | 5 refills | Status: DC
Start: 2019-03-30 — End: 2019-03-31

## 2019-03-30 NOTE — Telephone Encounter (Signed)
Search Terms: Deavion Dobbs, 07/03/1996   Search Date: 03/30/2019 11:47:57 AM   Searching on behalf of: [id number] - Forrestine Him [name]   The Drug Utilization Report below displays all of the controlled substance prescriptions, if any, that your patient has filled in the last twelve months. The information displayed on this report is compiled from pharmacy submissions to the Department, and accurately reflects the information as submitted by the pharmacies.  This report was requested by: Jennye Boroughs   Reference #: [id number]   Others' Prescriptions  Patient Name: Cameron [name]   Birth Date: 08-05-1996   Address: [address] [place], [place] [address]   Sex: Male   Rx Written Rx Dispensed Drug Quantity Days Supply Prescriber Name Payment Method Dispenser   02/27/2019 02/27/2019 oxycodone hcl 10 mg tablet  150 25 [name], Rexene Edison MD [facility]   02/18/2019 02/19/2019 oxycodone hcl 10 mg tablet  30 5 [name], Rexene Edison MD Insurance [facility]   02/13/2019 02/13/2019 oxycodone hcl 10 mg tablet  30 5 [name], [name] Insurance [facility]   01/26/2019 01/26/2019 oxycodone hcl 10 mg tablet  84 14 [name], Rexene Edison MD Insurance [facility]   01/09/2019 01/10/2019 oxycodone hcl 10 mg tablet  84 14 [name], [name] Insurance [facility]   12/23/2018 12/24/2018 oxycodone hcl 10 mg tablet  84 14 [name], Rexene Edison MD Insurance [facility]   12/09/2018 12/10/2018 oxycodone hcl 10 mg tablet  84 14 [name], [name] [facility]   11/24/2018 11/26/2018 oxycodone hcl 10 mg tablet  84 14 [name], Rexene Edison MD Insurance [facility]   11/14/2018 11/14/2018 oxycodone hcl 10 mg tablet  57 14 Reinhardt, [place] [facility]   10/31/2018 11/01/2018 oxycodone hcl 10 mg tablet  84 14 [name], Rexene Edison MD Insurance [facility]   10/16/2018 10/16/2018 oxycodone hcl 10 mg tablet  84 14 Reinhardt, [place] [facility]   09/26/2018 09/29/2018 oxycodone hcl 10 mg tablet  84 14 [name], [name] [place] [facility]   09/04/2018 09/11/2018 oxycodone hcl 10 mg tablet  89 14 Reinhardt, [place]   09/03/2018 09/04/2018 oxycodone hcl 10 mg tablet  40 20 Reinhardt, [place] [facility]   08/20/2018 08/21/2018 oxycodone hcl 10 mg tablet  84 14 [name], Rexene Edison MD Insurance [facility]   08/07/2018 08/07/2018 oxycodone hcl 10 mg tablet  84 14 [name], Rexene Edison MD Insurance [facility]   07/23/2018 07/24/2018 oxycodone hcl 10 mg tablet  84 14 [name], Rexene Edison MD Insurance [facility]   07/08/2018 07/11/2018 oxycodone hcl 10 mg tablet  84 14 Reinhardt, [place]   06/27/2018 06/27/2018 oxycodone hcl 10 mg tablet  84 14 Reinhardt, [place] [facility]   06/11/2018 06/13/2018 oxycodone hcl 10 mg tablet  84 14 [name], Rexene Edison MD Insurance [facility]   05/30/2018 05/30/2018 oxycodone hcl 10 mg tablet  84 14 Reinhardt, [place] [facility]   05/14/2018 05/14/2018 oxycodone hcl 10 mg tablet  84 14 [name], [name]  [facility]   04/30/2018 04/30/2018 oxycodone hcl 10 mg tablet  84 14 [name], Gwenith Spitziffany L MD Insurance [facility]   04/09/2018 04/09/2018 oxycodone hcl 10 mg tablet  84 14 [place], [address] M Insurance [facility] [place] [facility]

## 2019-03-30 NOTE — Progress Notes (Signed)
On 7/2 CM and Cameron [name] spoke to Cameron [name] on Zoom prior to his telemed visit. CM informed him of the transfer in care managers and [place] verbalized understanding. CM sent a text message afterward of contact information and encouraged him to call or text with any immediate care management needs. No needs were identified at this time. Follow up plan is to review his care plan this month and assist with nay future care management needs.

## 2019-03-31 ENCOUNTER — Other Ambulatory Visit: Payer: Self-pay | Admitting: Internal Medicine

## 2019-03-31 ENCOUNTER — Ambulatory Visit: Payer: Medicaid Other | Admitting: Internal Medicine

## 2019-03-31 ENCOUNTER — Other Ambulatory Visit: Payer: Self-pay | Admitting: Primary Care

## 2019-03-31 DIAGNOSIS — Z0000 Encounter for general adult medical examination without abnormal findings: Secondary | ICD-10-CM

## 2019-03-31 DIAGNOSIS — E611 Iron deficiency: Secondary | ICD-10-CM

## 2019-03-31 DIAGNOSIS — K5100 Ulcerative (chronic) pancolitis without complications: Secondary | ICD-10-CM

## 2019-03-31 DIAGNOSIS — D564 Hereditary persistence of fetal hemoglobin [HPFH]: Secondary | ICD-10-CM

## 2019-03-31 DIAGNOSIS — D571 Sickle-cell disease without crisis: Secondary | ICD-10-CM

## 2019-03-31 MED ORDER — OXYCODONE HCL 10 MG PO TABS *I*
10.0000 mg | ORAL_TABLET | ORAL | 0 refills | Status: DC | PRN
Start: 2019-03-31 — End: 2019-04-25

## 2019-03-31 MED ORDER — FOLIC ACID 1 MG PO TABS *I*
1.0000 mg | ORAL_TABLET | Freq: Every day | ORAL | 5 refills | Status: DC
Start: 2019-03-31 — End: 2019-03-31

## 2019-03-31 NOTE — Progress Notes (Signed)
[facility]  [address]  [place] [address]  Phone: [phone]  Fax: [phone]    CHIEF COMPLAINT     Chief Complaint   Patient presents with    Follow-up       TELEMEDICINE CONSENT     Visit being conducted by Video in lieu of a face to face visit to minimize health care worker and patient exposure to COVID-19, and conserve PPE.    Location of Patient: home  Location of Telemedicine Provider: clinical office  Other Participants in telemedicine encounter and roles: NP student    Consent was obtained from the patient to complete this telemedicine visit; including the potential for financial liability.  How did the patient provide consent? Verbal Consent Only    PRIMARY DIAGNOSIS   Sickle Cell disease    SUBJECTIVE   Sickle Cell vaso-occlusive crisis   Onset of change in pain: no change of pain, tolerable   Location of pain: lower back   Inciting events: increases with heat   Interventions at home: medication, rest/relaxation, fluid    "Pain is good" tolerable. Rates pain 3/10  Dependent on the weather. Can increase to 7/10 in heat  Pain meds working to make pain tolerable  Drinking 5 16oz bottles of water/day (80oz/day)  Taking Oxycodone 10mg  q4h ATC including overnight  Taking Tylenol 500mg  once a day every 1-2 days  Takes Flexeril 10mg  HS   No Ibuprofen d/t hx of UC  No hydrea ordered  Hct 21 Hgb 5.9  I-STOP checked      Current home management:   Non pharmacologic interventions:rest and oral hydration 80oz water/day   Non opioid pain modifying medications: tylenol  and flexeril   Short acting Opioid pain medications:oxycodone 10 mg Q 4 hr including in the middle of sleeping at night   Long acting Opioid pain medications: none    UC:  BM about once per day  2 months without a flare up   Compliant with Mesalamine  Dark stool d/t ferous sulfate   Denies abdominal pain, distention, nausea, vomiting, constipation, diarrhea, blood in stool    Iron deficiency:  Taking iron  supplement daily  Denies increased fatigue  Iron 24, TIBC 417, ferritin 11 (6/1)      Resp:  Denies chest pain, fever, cough, SOB, sputum production, congestion, runny nose, sore throat    Social  Lives with mom brother and sister  Staying away from people d/t COVID  Denies anxiety and depression    Health Maintenance:   Former smoker. Does not currently smoke.  Denies ETOH use  Denies substance abuse.  Denies THC smoke or edible  Sexually active- uses protection. Declines STD screening.  Wears seatbelt in car.   Reports feeling safe in his home  Denies depression or anxiety   Walking every day  Unable to describe eating habits  Education on testicular CA risk factors and self testicular exams      Plan:  Refill request for Oxy and folic acid- fill at [facility] outpatient  Increase fluid intake especially on hot days      Current Pain score: There were no vitals filed for this visit.    Emergency Room visits for this event: 0  Hospitalizations for this event: 0    Current disease modifying therapy: none    Medications Reviewed and changes     Current Outpatient Medications:     folic acid (FOLVITE) 1 MG tablet, Take 1 tablet (1 mg total)  by mouth daily, Disp: 30 tablet, Rfl: 5    mesalamine (ASACOL HD) 800 MG tablet, TAKE 3 TABLETS (2,400 MG TOTAL) BY MOUTH 2 TIMES DAILY, Disp: 180 tablet, Rfl: 2    ergocalciferol (DRISDOL) 50000 UNIT capsule, TAKE 1 CAPSULE (50,000 UNITS TOTAL) BY MOUTH ONCE A WEEK, Disp: 4 capsule, Rfl: 5    oxyCODONE (ROXICODONE) 10 MG immediate release tablet, Take 1 tablet (10 mg total) by mouth every 4 hours as needed for Pain Max daily dose: 60 mg, Disp: 150 tablet, Rfl: 0    diclofenac (VOLTAREN) 1 % gel, Apply 4 g to affected area 4 times daily., Disp: 100 g, Rfl: 2    Elastic Bandages & Supports (ANKLE WRAP) MISC, Use PRN for right ankle. (Patient not taking: Reported on 11/05/2018), Disp: 2 each, Rfl: 0    ferrous sulfate 325 (65 FE) MG tablet, Take 1 tablet (325 mg total) by mouth 2  times daily (with meals), Disp: 100 tablet, Rfl: 3    acetaminophen (TYLENOL) 500 mg tablet, Take 500 mg by mouth every 6 hours as needed, Disp: , Rfl:     cyclobenzaprine (FLEXERIL) 10 MG tablet, Take 1 tablet (10 mg total) by mouth 3 times daily as needed for Muscle spasms (Patient not taking: Reported on 09/02/2018), Disp: 45 tablet, Rfl: 0    naloxone (NARCAN) 4 mg/0.1 mL nasal spray, Instill 1 spray in 1 nostril once for opioid reversal. Repeat in alternating nostrils with new package every 2-3 minutes until response, Disp: 2 each, Rfl: 5  Review of Systems   Constitutional: Negative for activity change, fatigue and fever.   HENT: Negative for congestion and sore throat.    Respiratory: Negative for cough, chest tightness, shortness of breath and wheezing.    Cardiovascular: Negative for chest pain.   Gastrointestinal: Negative for abdominal distention, abdominal pain, blood in stool, constipation, diarrhea, nausea and vomiting.   Musculoskeletal: Positive for back pain.   Skin: Negative for pallor.   Psychiatric/Behavioral: The patient is not nervous/anxious.      Physical Exam   Constitutional: He is well-developed, well-nourished, and in no distress. He appears not lethargic, to not be writhing in pain, not malnourished, not dehydrated and not jaundiced. He appears healthy.  Non-toxic appearance. He does not have a sickly appearance. No distress.   HENT:   Head: Normocephalic.   Eyes: No scleral icterus.   Neck: Normal range of motion. Neck supple.   Pulmonary/Chest: Effort normal. No accessory muscle usage. No respiratory distress.   Musculoskeletal: Normal range of motion.   Neurological: He is alert. He appears not lethargic.   Skin: Skin is dry. He is not diaphoretic. No pallor.   Psychiatric: Mood, memory, affect and judgment normal. His mood appears not anxious. He does not exhibit a depressed mood.       OBJECTIVE   PHYSICAL EXAM  This visit was performed during a pandemic event, and the physical  exam was limited to my Video observation of this patient's organ systems and/or body areas.   GEN:  alert, looks comfortable  HEENT: scleral icterus absent        Lab results: 02/23/19  1352   WBC 7.5   Hemoglobin 5.9*   Hematocrit 21*   RBC 3.5*   Platelets 255         ASSESSMENT / DIAGNOSIS     Cameron [name] was seen today for follow-up.    Diagnoses and all orders for this visit:    Health care maintenance  Review of age appropriate healthcare screening completed.  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 Pt wakes up in the middle of the night to take Oxycodone. Nonpharmacologic maintenance includes rest and fluid intake of 80oz water/day. Pain is increased with heat.   -Encouraged to drink 1 gallon of water/day, and 1.5-2 gallons of water on hot days or days with increased activity  -Increase Tylenol frequency and attempt to wean off Oxy in the middle of the night  -Increase frequency of Flexeril up to TID   -Oxycodone rx refilled    Ulcerative pancolitis without complication  Denies any flare ups within the last 2 months.   -continue taking mesalamine  -iron infusions as needed    Iron deficiency related to UC  Labs WNL, denies increased fatigue.   -iron infusions as needed  -continue iron oral supplement       Pain management treatment plan   Patient has co diagnosis of ulcerative colitis dx age 23.  In addition Blood type O+ with antibodies anti-fya,jkb,lea  Outpatient Pain Regimen:  Long acting: none  Short acting; Oxy IR 10mg  Q4 hrs prn pain  Home Based titration regimen: none, needs assessment due to uncontrolled UC0  Adjunct therapies: none, holding NSAID due  to ongoing LGI bleeding  Other: none    [facility]    ED/Infusion Center care plan recommendations:  Toradol 30 mg IV Q6 hrs ATC  NS 2 L IV bolus  Transfuse for HcT <18  Hydromorphone 2 mg IV Q30 min prn pain score>5  Recommend hospitalization is pain score remains >5 after 3 doses of Hydromorphone 2 mg IV    Labs on arrival: CBC, retic, CHEM14  CXR if complaints of chest pain to evaluate for infiltrate consistent with acute chest syndrome  Assess for need for bronchodilator if complaints of chest pain    Inpatient care plan recommendations:  Toradol 30 mg IV Q6 hrs ATC  NS bolus until euvolemic followed by D51/2NS at 125 cc/hr until tolerating PO well  PCA: Start with demand only Hydromorophone PCA 1 mg Q20 min with lockout 12 mg over 4 hrs Recommend titration of pain regimen until achieving pain score <5     Prior to discharge recommend 24 hours of PO pain medications to assess for sx of withdrawal with transition and continued pain control to prevent readmission    Transfuse if HcT< 18 and consider GI consult as patient's most recent cause for profound anemia is due to UC    Venofer orders in Dr Concha NorwayPulcino    Patient Instructions   Increase fluid intake to 1 gallon of water per day. Increase to 1.5-2 gallons of water per day on hot days or days with increased activity. This will help decrease your pain and prevent a crisis.    You can take Tylenol 500mg  tablet every 8 hours as needed. Try taking this more often, like at night to try to wean down on taking Oxycodone in the middle of the night.     You can also take Flexeril up to 3 times per day as needed for pain. This will help decrease  your pain and decrease the frequency of taking Oxycodone.     Pick up prescription for folic acid and [facility]codone [facility]        No follow-ups on file.  --Patient instructed to call if symptoms are not improving or worsening      Electronically signed by Jodi GeraldsKristine M , NP  03/31/2019   4:45 PM  [facility], Phone: [phone]      21+ minutes was spent with the patient, patient representatives, and/or other attendees.       Billing codes for telephone visits,Routine E&M codes can be used for video visits  99441 5-10 minutes  99442 11-20 minutes  99443 > 20 minutes

## 2019-03-31 NOTE — Progress Notes (Deleted)
[facility]  [address]  [place] [id number]  Phone: [phone]  Fax: [phone]    CHIEF COMPLAINT   No chief complaint on file.      TELEMEDICINE CONSENT     Visit being conducted by Video in lieu of a face to face visit to minimize health care worker and patient exposure to COVID-19, and conserve PPE.    Location of Patient: home  Location of Telemedicine Provider: clinical office  Other Participants in telemedicine encounter and roles: NP student    Consent was obtained from the patient to complete this telemedicine visit; including the potential for financial liability.  How did the patient provide consent? Verbal Consent Only    PRIMARY DIAGNOSIS   Sickle Cell disease, type  {CCC Sickle Cell type:28944}    SUBJECTIVE   Sickle Cell vaso-occlusive crisis   Onset of change in pain: ***   Location of pain: ***   Inciting events: ***   Interventions at home: ***    Pain is good, dependent on the weather.   Lower back  Hot weather spikes pain up to a 7/10  Pain meds bring down to tolerable level 3/10  3/10 now  Rest and relax  5 water bottles per day. Double amount on hot days       Current home management:   Non pharmacologic interventions:{cccscdnonmed:24914}   Non opioid pain modifying medications: {cccscdnonopioid:24915}   Short acting Opioid pain medications:{cccscdshortacting:24917}*** mg Q *** hr   Long acting Opioid pain medications: {cccscdlongacting:24919}*** mg Q *** hr      Oxy 4x/day. Wakes up in middle night to take it  Tylenol 500mg  every 2-3 days  Flexeril once per day HS  No Ibuprofen d/t ulcerative colitis   No hydrea    No chest resp symptoms    Iron deficiency:  Taking iron supplement daily  Denies increased fatigue      UC:  BM about once per day  2 months without a flare up   Mesalamine  No GI symptoms   Dark stool    Social  Lives with mom brother and sister  Staying away from     Plan:  Refill request for Oxy and folic acid [facility]  Increase  fluid    Maintenance:   No smoking- used to  No drinking  No edibles  Sexually active- denies STD screening   Seatbelt   Safe at house  Sleep the same  Denies depression or anxiety   Walking   Diet         Current Pain score: There were no vitals filed for this visit.    Emergency Room visits for this event: {gen number 6-57:846962}0-10:310397}  Hospitalizations for this event: {gen number 9-52:841324}0-10:310397}    Current disease modifying therapy: {CCC Sickle Cell Dis Mod Therapy:28945}    Medications Reviewed and changes     Current Outpatient Medications:     folic acid (FOLVITE) 1 MG tablet, Take 1 tablet (1 mg total) by mouth daily, Disp: 30 tablet, Rfl: 5    mesalamine (ASACOL HD) 800 MG tablet, TAKE 3 TABLETS (2,400 MG TOTAL) BY MOUTH 2 TIMES DAILY, Disp: 180 tablet, Rfl: 2    ergocalciferol (DRISDOL) 50000 UNIT capsule, TAKE 1 CAPSULE (50,000 UNITS TOTAL) BY MOUTH ONCE A WEEK, Disp: 4 capsule, Rfl: 5    oxyCODONE (ROXICODONE) 10 MG immediate release tablet, Take 1 tablet (10 mg total) by mouth every 4 hours as needed for Pain Max  daily dose: 60 mg, Disp: 150 tablet, Rfl: 0    diclofenac (VOLTAREN) 1 % gel, Apply 4 g to affected area 4 times daily., Disp: 100 g, Rfl: 2    Elastic Bandages & Supports (ANKLE WRAP) MISC, Use PRN for right ankle. (Patient not taking: Reported on 11/05/2018), Disp: 2 each, Rfl: 0    ferrous sulfate 325 (65 FE) MG tablet, Take 1 tablet (325 mg total) by mouth 2 times daily (with meals), Disp: 100 tablet, Rfl: 3    acetaminophen (TYLENOL) 500 mg tablet, Take 500 mg by mouth every 6 hours as needed, Disp: , Rfl:     cyclobenzaprine (FLEXERIL) 10 MG tablet, Take 1 tablet (10 mg total) by mouth 3 times daily as needed for Muscle spasms (Patient not taking: Reported on 09/02/2018), Disp: 45 tablet, Rfl: 0    naloxone (NARCAN) 4 mg/0.1 mL nasal spray, Instill 1 spray in 1 nostril once for opioid reversal. Repeat in alternating nostrils with new package every 2-3 minutes until response, Disp: 2 each,  Rfl: 5  Review of Systems    OBJECTIVE   PHYSICAL EXAM  This visit was performed during a pandemic event, and the physical exam was limited to my {Telephone/Video:28939} observation of this patient's organ systems and/or body areas.   GEN:  alert, looks {mildly/moderately/severely:19575} uncomfortable  {CCCHEENT (Optional):27221}        Lab results: 02/23/19  1352   WBC 7.5   Hemoglobin 5.9*   Hematocrit 21*   RBC 3.5*   Platelets 255         ASSESSMENT / DIAGNOSIS     There are no diagnoses linked to this encounter.    Pain management treatment plan   Patient has co diagnosis of ulcerative colitis dx age 45.  In addition Blood type O+ with antibodies anti-fya,jkb,lea  Outpatient Pain Regimen:  Long acting: none  Short acting; Oxy IR 10mg  Q4 hrs prn pain  Home Based titration regimen: none, needs assessment due to uncontrolled UC0  Adjunct therapies: none, holding NSAID due to ongoing LGI bleeding  Other: none    Preferred hospital: [facility] care plan recommendations:  Toradol 30 mg IV Q6 hrs ATC  NS 2 L IV bolus  Transfuse for HcT <18  Hydromorphone 2 mg IV Q30 min prn pain score>5  Recommend hospitalization is pain score remains >5 after 3 doses of Hydromorphone 2 mg IV    Labs on arrival: CBC, retic, CHEM14  CXR if complaints of chest pain to evaluate for infiltrate consistent with acute chest syndrome  Assess for need for bronchodilator if complaints of chest pain    Inpatient care plan recommendations:  Toradol 30 mg IV Q6 hrs ATC  NS bolus until euvolemic followed by D51/2NS at 125 cc/hr until tolerating PO well  PCA: Start with demand only Hydromorophone PCA 1 mg Q20 min with lockout 12 mg over 4 hrs Recommend titration of pain regimen until achieving pain score <5     Prior to discharge recommend 24 hours of PO pain medications to assess for sx of withdrawal with transition and continued pain control to prevent readmission    Transfuse if HcT< 18 and consider GI consult as  patient's most recent cause for profound anemia is due to UC    Venofer orders in Dr Wilhelmenia Blase    There are no Patient Instructions on file for this visit.    No follow-ups on file.  --Patient instructed to call if symptoms  are not improving or worsening      Electronically signed by Jodi GeraldsKristine M , NP 03/31/2019   1:06 PM  [facility], Phone: [phone]      {TIME UJWJX:91478}SPENT:28923} minutes was spent with the patient, patient representatives, and/or other attendees.       Billing codes for telephone visits,Routine E&M codes can be used for video visits  99441 5-10 minutes  99442 11-20 minutes  99443 > 20 minutes

## 2019-03-31 NOTE — Telephone Encounter (Signed)
Patient Name: Cameron [name]   Birth Date: [birth year]   Address: [address] [place], [place] [address]   Sex: Male   Rx Written Rx Dispensed Drug Quantity Days Supply Prescriber Name Payment Method Dispenser   02/27/2019 02/27/2019 oxycodone hcl 10 mg tablet  150 25 [name], Rexene Edison MD Insurance [facility]   02/18/2019 02/19/2019 oxycodone hcl 10 mg tablet  30 5 [name], Rexene Edison MD Insurance [facility] [place]   02/13/2019 02/13/2019 oxycodone hcl 10 mg tablet  30 5 [name], [name] Insurance [facility]   01/26/2019 01/26/2019 oxycodone hcl 10 mg tablet  84 14 [name], Rexene Edison MD Insurance [facility]   01/09/2019 01/10/2019 oxycodone hcl 10 mg tablet  84 14 [name], [name] Insurance [facility]   12/23/2018 12/24/2018 oxycodone hcl 10 mg tablet  84 14 [name], Rexene Edison MD Insurance [facility]   12/09/2018 12/10/2018 oxycodone hcl 10 mg tablet  84 14 [name], [name] Insurance [facility]   11/24/2018 11/26/2018 oxycodone hcl 10 mg tablet  84 14 [name], Rexene Edison MD Insurance [facility]   11/14/2018 11/14/2018 oxycodone hcl 10 mg tablet  49 14 Reinhardt, [place] [facility]   10/31/2018 11/01/2018 oxycodone hcl 10 mg tablet  84 14 [name], Rexene Edison MD Insurance [facility]   10/16/2018 10/16/2018 oxycodone hcl 10 mg tablet  52 14 Reinhardt, [place] [facility]   09/26/2018 09/29/2018 oxycodone hcl 10 mg tablet  84 14 [name], [name] [place] [facility]   09/04/2018 09/11/2018 oxycodone hcl 10 mg tablet  58 14 Reinhardt, [place]   09/03/2018 09/04/2018 oxycodone hcl 10 mg tablet  40 20 Reinhardt, [place] [facility]   08/20/2018 08/21/2018 oxycodone hcl 10 mg tablet  84 14  [name], Rexene Edison MD Insurance [facility]   08/07/2018 08/07/2018 oxycodone hcl 10 mg tablet  84 14 [name], Rexene Edison MD Insurance [facility]   07/23/2018 07/24/2018 oxycodone hcl 10 mg tablet  84 14 [name], Rexene Edison MD Insurance [facility]   07/08/2018 07/11/2018 oxycodone hcl 10 mg tablet  84 14 Reinhardt, [place]   06/27/2018 06/27/2018 oxycodone hcl 10 mg tablet  84 14 Reinhardt, [place] [facility]   06/11/2018 06/13/2018 oxycodone hcl 10 mg tablet  84 14 [name], Rexene Edison MD Insurance [facility]   05/30/2018 05/30/2018 oxycodone hcl 10 mg tablet  38 14 Reinhardt, [place] [facility]   05/14/2018 05/14/2018 oxycodone hcl 10 mg tablet  84 14 [name], Rexene Edison MD Insurance [facility]   04/30/2018 04/30/2018 oxycodone hcl 10 mg tablet  84 14 [name], Rexene Edison MD Insurance [facility]   04/09/2018 04/09/2018 oxycodone hcl 10 mg tablet  84 [place], [place] [facility] [place]

## 2019-03-31 NOTE — Patient Instructions (Addendum)
Increase fluid intake to 1 gallon of water per day. Increase to 1.5-2 gallons of water per day on hot days or days with increased activity. This will help decrease your pain and prevent a crisis.    You can take Tylenol 500mg  tablet every 8 hours as needed. Try taking this more often, like at night to try to wean down on taking Oxycodone in the middle of the night.     You can also take Flexeril up to 3 times per day as needed for pain. This will help decrease your pain and decrease the frequency of taking Oxycodone.     Pick up prescription for folic acid and Oxycodone at the [facility] outpatient pharmacy

## 2019-04-10 ENCOUNTER — Encounter: Payer: Self-pay | Admitting: Primary Care

## 2019-04-13 NOTE — Telephone Encounter (Signed)
Writer attempted to reach patient to gather more information on this request. No answer. Writer LVM and provided appropriate call back.

## 2019-04-14 NOTE — Telephone Encounter (Signed)
Writer inactivated patient's mother Cameron [name] as an emergency contact.  Cameron [name] is now listed as his emergency contact.   Cameron [name] has previously filled out [name] Proxy paperwork listing his mother as HCP.

## 2019-04-14 NOTE — Telephone Encounter (Signed)
This outlines that [place]' mother can no longer call for information or request medications for him -

## 2019-04-14 NOTE — Telephone Encounter (Signed)
Hi all,     I called Jedidiah and read word for word Tiffany's message because he opened it but didn't read it. He did not go into any detail as to why he wants these changes, but he did state that he understands each point addressed in Tiffany's message. He confirmed that he wants these changes made for his medical management and care management as well.    His girlfriend is Oleta Mouse, her cell number is [phone] and they are not currently living together.

## 2019-04-15 ENCOUNTER — Other Ambulatory Visit: Payer: Self-pay

## 2019-04-15 ENCOUNTER — Encounter: Payer: Self-pay | Admitting: Gastroenterology

## 2019-04-15 NOTE — Progress Notes (Signed)
On 7/21 CM called Cameron [name] to review his request to change information on file regarding people involved in his care. Cameron [name] stated that he wanted to change his emergency contact to his girlfriend Cameron [name]. He requested that his emergency contact and health care proxy be revoked from his mother Cameron [name] and assigned to Cameron [name]. CM reviewed a provider message sent to Cameron [name] from Cameron [name] outlining the implications of the changes he requested and how that would affect who is allowed access to information about his medical care and care management. Cameron [name] verbalized understandings and did not present any questions. CM then took down Cameron [name] full name and contact information and routed the message to Cameron [name] and med staff involved in changing his chart.     CM also spoke to Cameron [name] over the phone to review the conversation with Cameron [name] and strategize the best way to move forward to respect his wishes. The plan moving forward is to defer any questions about Cameron [name] and his care from those removed from his support contact list and encourage anyone not listed to speak to Cameron [name] themselves. CM also informed Cameron [name] that CM can provide Cameron [name] with a new health care proxy form to be completed with providers at a later date.     Follow up plan is to update his 5055 and chart appropriately and continue to assist clinical staff as needed.

## 2019-04-15 NOTE — Progress Notes (Signed)
On 7/20 CM and Cameron [name] collaborated to review his care plan over the phone in lieu of a face to face meeting.    Cameron [name] has made progress toward his goal of experiencing fewer sickle cell pain crisis over the next six months by following all medical advice, adjusting to video visits with providers due to COVID-19 pandemic, and attending all infusion appointments regularly.   Changes to plan include extending target dates to incorporate more time due to the pandemic and Cameron [name] not having any new care management goals. Cameron [name] expressed the ability to control his health care more independently and CM informed him of interventions that could help facilitate this through young adulthood. Cameron [name] verbalized understanding. CM and Cameron [name] worked together to create an objective related to growing independence and ability to improve medical compliance over time.     Cameron [name] confirmed his strengths are social support from his girlfriend and family members, and that he is able to maintain motivated in promoting his health. Cameron [name] is comfortable with communicating via phone call and Zoom.    Cameron [name] confirmed that his barriers are poor attendance to medical appointments at times and trust issues with people he does not know well. CM reviewed these barriers with Cameron [name] and he is agreeable to working on them over time.     Cameron [name] agreed to goal statement, objectives, and interventions on the care plan.    CM will send a copy to Cameron [name] to be signed and returned. Once received, a signed copy will be made available to [facility] and providers as necessary.

## 2019-04-25 ENCOUNTER — Other Ambulatory Visit: Payer: Self-pay

## 2019-04-25 ENCOUNTER — Other Ambulatory Visit: Payer: Self-pay | Admitting: Internal Medicine

## 2019-04-25 DIAGNOSIS — D571 Sickle-cell disease without crisis: Secondary | ICD-10-CM

## 2019-04-25 MED FILL — Folic Acid Tab 1 MG: ORAL | 30 days supply | Qty: 30 | Fill #0 | Status: AC

## 2019-04-27 ENCOUNTER — Other Ambulatory Visit: Payer: Self-pay | Admitting: Internal Medicine

## 2019-04-27 ENCOUNTER — Encounter: Payer: Self-pay | Admitting: Internal Medicine

## 2019-04-27 DIAGNOSIS — D571 Sickle-cell disease without crisis: Secondary | ICD-10-CM

## 2019-04-27 NOTE — Telephone Encounter (Signed)
Search Terms: Cameron BachelorLucius Romack, 1996/08/13   Search Date: 04/27/2019 08:24:49 AM   Searching on behalf of: [id number] - Darol DestineHeather Jeannine [name]   The Drug Utilization Report below displays all of the controlled substance prescriptions, if any, that your patient has filled in the last twelve months. The information displayed on this report is compiled from pharmacy submissions to the Department, and accurately reflects the information as submitted by the pharmacies.  This report was requested by: Nolberto HanlonJamie Goldia Ligman   Reference #: [id number]   Others' Prescriptions  Patient Name: Cameron [name]   Birth Date: 1996/08/13   Address[place] [address] [place], [place] [address]   Sex: Male   Rx Written Rx Dispensed Drug Quantity Days Supply Prescriber Name Payment Method Dispenser   03/31/2019 03/31/2019 oxycodone hcl 10 mg tablet  150 25 AmandaReinhardt, Barkley BrunsKristine M Insurance [facility]   02/27/2019 02/27/2019 oxycodone hcl 10 mg tablet  150 25 [name], Gwenith Spitziffany L MD Insurance [facility]   02/18/2019 02/19/2019 oxycodone hcl 10 mg tablet  30 5 [name], Gwenith Spitziffany L MD Insurance [facility]   02/13/2019 02/13/2019 oxycodone hcl 10 mg tablet  30 5 [name], [name] Insurance [facility]   01/26/2019 01/26/2019 oxycodone hcl 10 mg tablet  84 14 [name], Gwenith Spitziffany L MD Insurance [facility]   01/09/2019 01/10/2019 oxycodone hcl 10 mg tablet  84 14 [name], [name] Insurance [facility]   12/23/2018 12/24/2018 oxycodone hcl 10 mg tablet  84 14 [name], [name] Insurance [facility]   12/09/2018 12/10/2018 oxycodone hcl 10 mg tablet  84 14 [name], [name] Insurance [facility]   11/24/2018 11/26/2018 oxycodone hcl 10 mg tablet  84 14 [name], Gwenith Spitziffany L MD Insurance [facility]   11/14/2018 11/14/2018 oxycodone hcl 10 mg tablet  84 14 Roselind Rilyeinhardt, Kristine M Insurance [facility]   10/31/2018 11/01/2018 oxycodone hcl 10 mg tablet  84 14 [name], Gwenith Spitziffany L MD Insurance [facility]   10/16/2018 10/16/2018 oxycodone hcl 10 mg tablet  84 14 Roselind Rilyeinhardt, Kristine M Insurance [facility]   09/26/2018 09/29/2018 oxycodone hcl 10 mg tablet  84 14 [name], [name] Cash [facility]   09/04/2018 09/11/2018 oxycodone hcl 10 mg tablet  84 14 Roselind Rilyeinhardt, Kristine M Insurance [facility]   09/03/2018 09/04/2018 oxycodone hcl 10 mg tablet  40 20 Roselind Rilyeinhardt, Kristine M Insurance [facility]   08/20/2018 08/21/2018 oxycodone hcl 10 mg tablet  84 14 [name], Gwenith Spitziffany L MD Insurance [facility]   08/07/2018 08/07/2018 oxycodone hcl 10 mg tablet  84 14 [name], Gwenith Spitziffany L MD Insurance [facility]   07/23/2018 07/24/2018 oxycodone hcl 10 mg tablet  84 14 [name], Gwenith Spitziffany L MD Insurance [facility]   07/08/2018 07/11/2018 oxycodone hcl 10 mg tablet  84 14 Roselind Rilyeinhardt, Kristine M Insurance [facility]   06/27/2018 06/27/2018 oxycodone hcl 10 mg tablet  84 14 Roselind Rilyeinhardt, Kristine M Insurance [facility]   06/11/2018 06/13/2018 oxycodone hcl 10 mg tablet  84 14 [name], Gwenith Spitziffany L MD Insurance [facility]   05/30/2018 05/30/2018 oxycodone hcl 10 mg tablet  84 14 Roselind RilyReinhardt, Kristine M Insurance  [facility]   05/14/2018 05/14/2018 oxycodone hcl 10 mg tablet  84 14 [name], Rexene Edison MD Insurance [facility]   04/30/2018 04/30/2018 oxycodone hcl 10 mg tablet  84 14 [name], [name] Insurance [facility] [place]

## 2019-04-27 NOTE — Telephone Encounter (Signed)
Duplicate Request.

## 2019-04-28 ENCOUNTER — Other Ambulatory Visit: Payer: Self-pay

## 2019-04-28 ENCOUNTER — Other Ambulatory Visit: Payer: Self-pay | Admitting: Internal Medicine

## 2019-04-28 ENCOUNTER — Encounter: Payer: Self-pay | Admitting: Internal Medicine

## 2019-04-28 DIAGNOSIS — D571 Sickle-cell disease without crisis: Secondary | ICD-10-CM

## 2019-04-28 MED ORDER — OXYCODONE HCL 10 MG PO TABS *I*
10.0000 mg | ORAL_TABLET | ORAL | 0 refills | Status: DC | PRN
Start: 2019-04-28 — End: 2019-05-28
  Filled 2019-04-28: qty 150, 25d supply, fill #0

## 2019-04-28 MED ORDER — OXYCODONE HCL 10 MG PO TABS *I*
10.0000 mg | ORAL_TABLET | ORAL | 0 refills | Status: DC | PRN
Start: 2019-04-28 — End: 2019-04-28
  Filled 2019-04-28: qty 150, 25d supply, fill #0

## 2019-04-28 NOTE — Telephone Encounter (Signed)
Refill of medications sent to pharmacy.  Barbaraann Boys, NP

## 2019-05-01 ENCOUNTER — Ambulatory Visit: Payer: Medicaid Other | Admitting: Internal Medicine

## 2019-05-01 DIAGNOSIS — D571 Sickle-cell disease without crisis: Secondary | ICD-10-CM

## 2019-05-01 DIAGNOSIS — D564 Hereditary persistence of fetal hemoglobin [HPFH]: Secondary | ICD-10-CM

## 2019-05-01 DIAGNOSIS — K5100 Ulcerative (chronic) pancolitis without complications: Secondary | ICD-10-CM

## 2019-05-01 NOTE — Progress Notes (Signed)
[facility]  [address]  [place] [id number]  Phone: [phone]  Fax: [phone]    CHIEF COMPLAINT     Chief Complaint   Patient presents with    Follow-up       TELEMEDICINE CONSENT     Visit being conducted by Video in lieu of a face to face visit to minimize health care worker and patient exposure to COVID-19, and conserve PPE.    Location of Patient: home  Location of Telemedicine Provider: clinical office  Other Participants in telemedicine encounter and roles: NP student     Consent was obtained from the patient to complete this telemedicine visit; including the potential for financial liability.  How did the patient provide consent? Verbal Consent Only    PRIMARY DIAGNOSIS   Sickle Cell disease    SUBJECTIVE   Sickle Cell vaso-occlusive crisis   Onset of change in pain: 2 weeks ago   Location of pain: lower back    Inciting events: more physical job, stocking shelves   Interventions at home: resting, laying down, meds, water    Current home management:   Non pharmacologic interventions:rest and oral hydration   Non opioid pain modifying medications: Tylenol 500mg  as last resort (unable to quantify frequency) Flexeril 10mg  4x/week HS (makes him tired so does not take it more often)   Short acting Opioid pain medications:oxycodone 10mg  mg Q 6-8  Hr/day   Long acting Opioid pain medications: none and None    Takes Oxy when pain reaches 7/10, Oxy brings pain down to 0/10. Hesitant to take more often because he is fearful that he will OD or become addicted    No Ibuprofen d/t UC    No chest pain  Pain in left arm from shoulder to hand. Radiating up and down.   Tingling and sharp  Tingling pain wakes him up HS  Pain in all fingers  Started wearing a brace- helps   Cannot recall any trauma    PO intake: 5-6 water bottles/day  BMs ok     Current Pain score: There were no vitals filed for this visit.    Emergency Room visits for this event: 0  Hospitalizations for this event:  0    Current disease modifying therapy: none     Medications Reviewed and changes     Current Outpatient Medications:     oxyCODONE (ROXICODONE) 10 MG immediate release tablet, Take 1 tablet (10 mg total) by mouth every 4 hours as needed for Pain Max daily dose: 6 tablets, Disp: 150 tablet, Rfl: 0    folic acid (FOLVITE) 1 MG tablet, TAKE 1 TABLET (1 MG TOTAL) BY MOUTH DAILY, Disp: 30 tablet, Rfl: 5    mesalamine (ASACOL HD) 800 MG tablet, TAKE 3 TABLETS (2,400 MG TOTAL) BY MOUTH 2 TIMES DAILY, Disp: 180 tablet, Rfl: 2    ergocalciferol (DRISDOL) 50000 UNIT capsule, TAKE 1 CAPSULE (50,000 UNITS TOTAL) BY MOUTH ONCE A WEEK, Disp: 4 capsule, Rfl: 5    diclofenac (VOLTAREN) 1 % gel, Apply 4 g to affected area 4 times daily., Disp: 100 g, Rfl: 2    Elastic Bandages & Supports (ANKLE WRAP) MISC, Use PRN for right ankle. (Patient not taking: Reported on 11/05/2018), Disp: 2 each, Rfl: 0    ferrous sulfate 325 (65 FE) MG tablet, Take 1 tablet (325 mg total) by mouth 2 times daily (with meals), Disp: 100 tablet, Rfl: 3    acetaminophen (TYLENOL) 500 mg tablet,  Take 500 mg by mouth every 6 hours as needed, Disp: , Rfl:     cyclobenzaprine (FLEXERIL) 10 MG tablet, Take 1 tablet (10 mg total) by mouth 3 times daily as needed for Muscle spasms (Patient not taking: Reported on 09/02/2018), Disp: 45 tablet, Rfl: 0    naloxone (NARCAN) 4 mg/0.1 mL nasal spray, Instill 1 spray in 1 nostril once for opioid reversal. Repeat in alternating nostrils with new package every 2-3 minutes until response, Disp: 2 each, Rfl: 5  Review of Systems   Constitutional: Positive for activity change (heavy lifting at new job). Negative for appetite change and fever.   HENT: Negative for congestion and sore throat.    Respiratory: Negative for chest tightness.    Cardiovascular: Negative for chest pain.   Gastrointestinal: Negative for abdominal pain, constipation, diarrhea, nausea and vomiting.   Musculoskeletal: Positive for back pain.    Neurological: Positive for numbness (intermittent radiating sharp numbness and tingling in L shoulder down to hands ).       OBJECTIVE   PHYSICAL EXAM  This visit was performed during a pandemic event, and the physical exam was limited to my Video observation of this patient's organ systems and/or body areas.     Physical Exam   Constitutional: He is well-developed, well-nourished, and in no distress. No distress.   HENT:   Head: Normocephalic.   Eyes: No scleral icterus.   Neck: Normal range of motion. Neck supple.   Pulmonary/Chest: Effort normal. No respiratory distress.   Neurological: He is alert.   Skin: Skin is dry. He is not diaphoretic.   Psychiatric: Mood, memory, affect and judgment normal.           Lab results: 02/23/19  1352   WBC 7.5   Hemoglobin 5.9*   Hematocrit 21*   RBC 3.5*   Platelets 255         ASSESSMENT / DIAGNOSIS     Gr[place]vel was seen today for follow-up.    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 Patient preferences addressed. Barriers to treatment goals addressed.     Ulcerative pancolitis without complication  Underlying condition, all treatment decisions made with this in consideration            Pain management treatment plan   Patient has co diagnosis of ulcerative colitis dx age 21.  In addition Blood type O+ with antibodies anti-fya,jkb,lea  Outpatient Pain Regimen:  Long acting: none  Short acting; Oxy IR 10mg  Q4 hrs prn pain  Home Based titration regimen: none, needs assessment due to uncontrolled UC0  Adjunct therapies: none, holding NSAID due to ongoing LGI bleeding  Other: none    Preferred hospital: [facility] care plan recommendations:  Toradol 30 mg IV Q6 hrs ATC  NS 2 L IV bolus  Transfuse for HcT  <18  Hydromorphone 2 mg IV Q30 min prn pain score>5  Recommend hospitalization is pain score remains >5 after 3 doses of Hydromorphone 2 mg IV    Labs on arrival: CBC, retic, CHEM14  CXR if complaints of chest pain to evaluate for infiltrate consistent with acute chest syndrome  Assess for need for bronchodilator if complaints of chest pain    Inpatient care  plan recommendations:  Toradol 30 mg IV Q6 hrs ATC  NS bolus until euvolemic followed by D51/2NS at 125 cc/hr until tolerating PO well  PCA: Start with demand only Hydromorophone PCA 1 mg Q20 min with lockout 12 mg over 4 hrs Recommend titration of pain regimen until achieving pain score <5     Prior to discharge recommend 24 hours of PO pain medications to assess for sx of withdrawal with transition and continued pain control to prevent readmission    Transfuse if HcT< 18 and consider GI consult as patient's most recent cause for profound anemia is due to UC    Venofer orders in Dr Concha NorwayPulcino    There are no Patient Instructions on file for this visit.    No follow-ups on file.  --Patient instructed to call if symptoms are not improving or worsening      Electronically signed by Jodi GeraldsKristine M , NP 05/01/2019   5:44 PM  [facility], Phone: [phone]

## 2019-05-14 ENCOUNTER — Encounter: Payer: Self-pay | Admitting: Gastroenterology

## 2019-05-14 ENCOUNTER — Other Ambulatory Visit: Payer: Self-pay

## 2019-05-14 NOTE — Progress Notes (Signed)
CM contacted Amirr to remind him of his appointment with NP Kathlene Cote via video visit. Jeanluc confirmed he will be attending the video visit appointment. CM encouraged Zakaree to contact CM at the time of the meeting if he has any difficulty connecting to the link or any other issues related to the visit. Quinterrius expressed understanding.    Later, CM followed up with NP Kathlene Cote and confirmed that he attended the appointment. Aayansh confirmed that he has no barriers to care related to medical advice provided during the visit. Vernice has no follow up appointments scheduled at this time. CM offered resources for masks and encouraged him to reach out if any are needed at a layer date.      CM will follow up with Jabreel to continue to provide assistance with scheduling and attending medical appointments. CM will remain available to Moody for any additional care management needs or assistance.

## 2019-05-28 ENCOUNTER — Other Ambulatory Visit: Payer: Self-pay

## 2019-05-28 ENCOUNTER — Other Ambulatory Visit: Payer: Self-pay | Admitting: Internal Medicine

## 2019-05-28 DIAGNOSIS — D571 Sickle-cell disease without crisis: Secondary | ICD-10-CM

## 2019-05-28 MED FILL — Folic Acid Tab 1 MG: ORAL | 30 days supply | Qty: 30 | Fill #1 | Status: AC

## 2019-05-28 MED FILL — Ergocalciferol Cap 1.25 MG (50000 Unit): ORAL | 28 days supply | Qty: 4 | Fill #0 | Status: AC

## 2019-05-29 ENCOUNTER — Other Ambulatory Visit: Payer: Self-pay

## 2019-05-29 MED ORDER — OXYCODONE HCL 10 MG PO TABS *I*
10.0000 mg | ORAL_TABLET | ORAL | 0 refills | Status: DC | PRN
Start: 2019-05-29 — End: 2019-06-26
  Filled 2019-05-29: qty 150, 25d supply, fill #0

## 2019-05-29 NOTE — Telephone Encounter (Signed)
Search Terms: Cameron BachelorLucius Jons, June 14, 1996   Search Date: 05/29/2019 08:13:04 AM   Searching on behalf of: [id number] - Darol DestineHeather Jeannine [name]   The Drug Utilization Report below displays all of the controlled substance prescriptions, if any, that your patient has filled in the last twelve months. The information displayed on this report is compiled from pharmacy submissions to the Department, and accurately reflects the information as submitted by the pharmacies.  This report was requested by: Nolberto HanlonJamie Alder Murri   Reference #: [id number]   Others' Prescriptions  Patient Name: Cameron [name]   Birth Date: June 14, 1996   Address: [address] [place], [place] [id number]   Sex: Male   Rx Written Rx Dispensed Drug Quantity Days Supply Prescriber Name Payment Method Dispenser   04/28/2019 04/28/2019 oxycodone hcl 10 mg tablet  150 [address] Reinhardt, Barkley BrunsKristine M Other [facility]   03/31/2019 03/31/2019 oxycodone hcl 10 mg tablet  150 25 Roselind Rilyeinhardt, Kristine M Insurance [facility]   02/27/2019 02/27/2019 oxycodone hcl 10 mg tablet  150 25 [name], Gwenith Spitziffany L MD Insurance [facility]   02/18/2019 02/19/2019 oxycodone hcl 10 mg tablet  30 5 [name], Gwenith Spitziffany L MD Insurance [facility]   02/13/2019 02/13/2019 oxycodone hcl 10 mg tablet  30 5 [name], [name] Insurance [facility]   01/26/2019 01/26/2019 oxycodone hcl 10 mg tablet  84 14 [name], Gwenith Spitziffany L MD Insurance [facility]   01/09/2019 01/10/2019 oxycodone hcl 10 mg tablet  84 14 [name], [name] Insurance [facility]   12/23/2018 12/24/2018 oxycodone hcl 10 mg tablet  84 14 [name], [name] Insurance [facility]   12/09/2018 12/10/2018 oxycodone hcl 10 mg tablet  84 14 [name], [name] Insurance [facility]   11/24/2018 11/26/2018 oxycodone hcl 10 mg tablet  84 14 [name], Gwenith Spitziffany L MD Insurance [facility]   11/14/2018 11/14/2018 oxycodone hcl 10 mg tablet  84 14 Roselind Rilyeinhardt, Kristine M Insurance [facility]   10/31/2018 11/01/2018 oxycodone hcl 10 mg tablet  84 14 [name], Gwenith Spitziffany L MD Insurance [facility]   10/16/2018 10/16/2018 oxycodone hcl 10 mg tablet  84 14 Roselind Rilyeinhardt, Kristine M Insurance [facility]   09/26/2018 09/29/2018 oxycodone hcl 10 mg tablet  84 14 [name], [name] Cash [facility]   09/04/2018 09/11/2018 oxycodone hcl 10 mg tablet  84 14 Roselind Rilyeinhardt, Kristine M Insurance [facility]   09/03/2018 09/04/2018 oxycodone hcl 10 mg tablet  40 20 Roselind Rilyeinhardt, Kristine M Insurance [facility]   08/20/2018 08/21/2018 oxycodone hcl 10 mg tablet  84 14 [name], Gwenith Spitziffany L MD Insurance [facility]   08/07/2018 08/07/2018 oxycodone hcl 10 mg tablet  84 14 [name], Gwenith Spitziffany L MD Insurance [facility]   07/23/2018 07/24/2018 oxycodone hcl 10 mg tablet  84 14 [name], Gwenith Spitziffany L MD Insurance [facility]   07/08/2018 07/11/2018 oxycodone hcl 10 mg tablet  84 14 Roselind Rilyeinhardt, Kristine M Insurance [facility]   06/27/2018 06/27/2018 oxycodone hcl 10 mg tablet  84 14 Roselind Rilyeinhardt, Kristine M Insurance [facility]   06/11/2018 06/13/2018 oxycodone hcl 10 mg tablet  84 14 [name], [name] Insurance  [facility]   05/30/2018 05/30/2018 oxycodone hcl 10 mg tablet  21 [place], K[facility] [place]

## 2019-06-02 ENCOUNTER — Other Ambulatory Visit: Payer: Self-pay

## 2019-06-02 ENCOUNTER — Encounter: Payer: Self-pay | Admitting: Gastroenterology

## 2019-06-02 NOTE — Progress Notes (Signed)
CM received a notification from the health homes referral team that [name] was due to recertify for Medicaid. On 9/3 CM sent an email to DSS regarding this notification whether or not he is actually due to recertify for Medicaid in September. CM sent the inquiry in order to gather more information about possibility of need for care management assistance with recertification paperwork. CM also asked whether or not any mail correspondence has been sent to [facility] regarding the matter.     On 9/4 DSS team members responded to CM that [name] is not due to recertify this year and his care will remain authorized through 05/24/2020 and no documents were mailed. At this time CM notified supervisor [name] and notified the health homes referral coordinator as well. No further actions needed at this time.     Follow up plan is to remain in contact with [name] and reassess for care management needs at a later date.

## 2019-06-19 ENCOUNTER — Other Ambulatory Visit: Payer: Self-pay

## 2019-06-19 ENCOUNTER — Encounter: Payer: Self-pay | Admitting: Internal Medicine

## 2019-06-19 NOTE — Progress Notes (Signed)
CM and Cameron [name] collaborated to review his care management crisis plan over several telephone conversations in lieu of a face to face meeting. Cameron [name] reviewed his existing care management crisis plan and no other participants were involved at this time. He fully participated and took time to listen and review previously added information. Cameron [name] stated that no changes were needed to his crisis plan because he feels the information is still true to his behaviors and lifestyle. CM can continue to support Cameron [name] in crisis prevention by assisting with scheduling medical and behavioral health appointments as needed. CM can also contact his emergency contact as needed for any crisis intervention needs.

## 2019-06-26 ENCOUNTER — Other Ambulatory Visit: Payer: Self-pay | Admitting: Primary Care

## 2019-06-26 ENCOUNTER — Other Ambulatory Visit: Payer: Self-pay

## 2019-06-26 DIAGNOSIS — D571 Sickle-cell disease without crisis: Secondary | ICD-10-CM

## 2019-06-29 ENCOUNTER — Encounter: Payer: Self-pay | Admitting: Internal Medicine

## 2019-06-29 ENCOUNTER — Other Ambulatory Visit: Payer: Self-pay

## 2019-06-29 MED ORDER — OXYCODONE HCL 10 MG PO TABS *I*
10.0000 mg | ORAL_TABLET | ORAL | 0 refills | Status: DC | PRN
Start: 2019-06-29 — End: 2019-07-29
  Filled 2019-06-29: qty 150, 25d supply, fill #0

## 2019-06-29 NOTE — Telephone Encounter (Signed)
Search Terms: Tyon Cerasoli, 1995/11/14   Search Date: 06/29/2019 08:33:29 AM   Searching on behalf of: [id number] - Forrestine Him [name]   The Drug Utilization Report below displays all of the controlled substance prescriptions, if any, that your patient has filled in the last twelve months. The information displayed on this report is compiled from pharmacy submissions to the Department, and accurately reflects the information as submitted by the pharmacies.  This report was requested by: Jennye Boroughs   Reference #: [id number]   Others' Prescriptions  Patient Name: Cameron [name]   Birth Date: 05-12-1996   Address: [address] [place], [place] [address]   Sex: Male   Rx Written Rx Dispensed Drug Quantity Days Supply Prescriber Name Payment Method Dispenser   05/29/2019 05/29/2019 oxycodone hcl 10 mg tablet  150 25 [name], [name] Other [facility]   04/28/2019 04/28/2019 oxycodone hcl 10 mg tablet  150 [address] Minette Headland M Other [facility]   03/31/2019 03/31/2019 oxycodone hcl 10 mg tablet  150 25 Reinhardt, [place]   02/27/2019 02/27/2019 oxycodone hcl 10 mg tablet  150 25 [name], Rexene Edison MD Insurance [facility]   02/18/2019 02/19/2019 oxycodone hcl 10 mg tablet  30 5 [name], Rexene Edison MD Insurance [facility]   02/13/2019 02/13/2019 oxycodone hcl 10 mg tablet  30 5 [name], [name] Insurance [facility]   01/26/2019 01/26/2019 oxycodone hcl 10 mg tablet  84 14 [name], Rexene Edison MD Insurance [facility]   01/09/2019 01/10/2019 oxycodone hcl 10 mg tablet  84 14 [name], [name] Insurance [facility]   12/23/2018 12/24/2018 oxycodone hcl 10 mg tablet  84 14 [name], [name] Insurance [facility]   12/09/2018 12/10/2018 oxycodone hcl 10 mg tablet  84 14 [name], [name] Insurance [facility]   11/24/2018 11/26/2018 oxycodone hcl 10 mg tablet  84 14 [name], Rexene Edison MD Insurance [facility]   11/14/2018 11/14/2018 oxycodone hcl 10 mg tablet  84 14 Reinhardt, [place] [facility]   10/31/2018 11/01/2018 oxycodone hcl 10 mg tablet  84 14 [name], Rexene Edison MD Insurance [facility]   10/16/2018 10/16/2018 oxycodone hcl 10 mg tablet  84 14 Reinhardt, [place] [facility]   09/26/2018 09/29/2018 oxycodone hcl 10 mg tablet  84 14 [name], [name] [facility]   09/04/2018 09/11/2018 oxycodone hcl 10 mg tablet  24 [place], [place]   09/03/2018 09/04/2018 oxycodone hcl 10 mg tablet  40 20 Reinhardt, [place] [facility]   08/20/2018 08/21/2018 oxycodone hcl 10 mg tablet  84 14 [name], Rexene Edison MD Insurance [facility]   08/07/2018 08/07/2018 oxycodone hcl 10 mg tablet  84 14 [name], Rexene Edison MD Insurance [facility]   07/23/2018 07/24/2018 oxycodone hcl 10 mg tablet  84 14 [name], Rexene Edison MD Insurance [facility]   07/08/2018 07/11/2018 oxycodone hcl 10 mg tablet  43 [place], [place] [facility]

## 2019-06-29 NOTE — Telephone Encounter (Signed)
Patient notified that medication has been sent 

## 2019-07-14 NOTE — Telephone Encounter (Signed)
MyChart message sent to pt

## 2019-07-17 ENCOUNTER — Ambulatory Visit: Payer: Medicaid Other | Admitting: Internal Medicine

## 2019-07-17 NOTE — Progress Notes (Deleted)
[facility]  [address]  [place] [address]  Phone: [phone]  Fax: [phone]    CHIEF COMPLAINT   No chief complaint on file.      TELEMEDICINE CONSENT     Visit being conducted by {Telephone/Video:28939} in lieu of a face to face visit to minimize health care worker and patient exposure to COVID-19, and conserve PPE.    {Location of Patient:1603000}  {Location of Telemedicine Provider:28918}  Other Participants in telemedicine encounter and roles: ***    Consent was obtained from the patient to complete this telemedicine visit; including the potential for financial liability.  How did the patient provide consent? {ccctelemedconsent:26916}    PRIMARY DIAGNOSIS   Sickle Cell disease, type  {CCC Sickle Cell IONG:29528}    SUBJECTIVE   Sickle Cell vaso-occlusive crisis   Onset of change in pain: ***   Location of pain: ***   Inciting events: ***   Interventions at home: ***    Current home management:   Non pharmacologic interventions:{cccscdnonmed:24914}   Non opioid pain modifying medications: {cccscdnonopioid:24915}   Short acting Opioid pain medications:{cccscdshortacting:24917}*** mg Q *** hr   Long acting Opioid pain medications: {cccscdlongacting:24919}*** mg Q *** hr      Current Pain score: There were no vitals filed for this visit.    Emergency Room visits for this event: {gen number 4-13:244010}  Hospitalizations for this event: {gen number 2-72:536644}    Current disease modifying therapy: {CCC Sickle Cell Dis Mod Therapy:28945}    Medications Reviewed and changes     Current Outpatient Medications:     oxyCODONE (ROXICODONE) 10 MG immediate release tablet, Take 1 tablet (10 mg total) by mouth every 4 hours as needed for Pain Max daily dose: 6 tablets, Disp: 150 tablet, Rfl: 0    folic acid (FOLVITE) 1 MG tablet, TAKE 1 TABLET (1 MG TOTAL) BY MOUTH DAILY, Disp: 30 tablet, Rfl: 5    mesalamine (ASACOL HD) 800 MG tablet, TAKE 3 TABLETS (2,400 MG TOTAL) BY MOUTH 2  TIMES DAILY, Disp: 180 tablet, Rfl: 2    ergocalciferol (DRISDOL) 50000 UNIT capsule, TAKE 1 CAPSULE (50,000 UNITS TOTAL) BY MOUTH ONCE A WEEK, Disp: 4 capsule, Rfl: 5    diclofenac (VOLTAREN) 1 % gel, Apply 4 g to affected area 4 times daily., Disp: 100 g, Rfl: 2    Elastic Bandages & Supports (ANKLE WRAP) MISC, Use PRN for right ankle. (Patient not taking: Reported on 11/05/2018), Disp: 2 each, Rfl: 0    ferrous sulfate 325 (65 FE) MG tablet, Take 1 tablet (325 mg total) by mouth 2 times daily (with meals), Disp: 100 tablet, Rfl: 3    acetaminophen (TYLENOL) 500 mg tablet, Take 500 mg by mouth every 6 hours as needed, Disp: , Rfl:     cyclobenzaprine (FLEXERIL) 10 MG tablet, Take 1 tablet (10 mg total) by mouth 3 times daily as needed for Muscle spasms (Patient not taking: Reported on 09/02/2018), Disp: 45 tablet, Rfl: 0    naloxone (NARCAN) 4 mg/0.1 mL nasal spray, Instill 1 spray in 1 nostril once for opioid reversal. Repeat in alternating nostrils with new package every 2-3 minutes until response, Disp: 2 each, Rfl: 5  Review of Systems    OBJECTIVE   PHYSICAL EXAM  This visit was performed during a pandemic event, and the physical exam was limited to my {Telephone/Video:28939} observation of this patient's organ systems and/or body areas.   GEN:  alert, looks {mildly/moderately/severely:19575} uncomfortable  {CCCHEENT (  Optional):27221}        Lab results: 02/23/19  1352   WBC 7.5   Hemoglobin 5.9*   Hematocrit 21*   RBC 3.5*   Platelets 255         ASSESSMENT / DIAGNOSIS     There are no diagnoses linked to this encounter.    Pain management treatment plan   Patient has co diagnosis of ulcerative colitis dx age 23.  In addition Blood type O+ with antibodies anti-fya,jkb,lea  Outpatient Pain Regimen:  Long acting: none  Short acting; Oxy IR 10mg  Q4 hrs prn pain  Home Based titration regimen: none, needs assessment due to uncontrolled UC0  Adjunct therapies: none, holding NSAID due to ongoing LGI  bleeding  Other: none    Preferred hospital: [facility]    ED/Infusion Center care plan recommendations:  Toradol 30 mg IV Q6 hrs ATC  NS 2 L IV bolus  Transfuse for HcT <18  Hydromorphone 2 mg IV Q30 min prn pain score>5  Recommend hospitalization is pain score remains >5 after 3 doses of Hydromorphone 2 mg IV    Labs on arrival: CBC, retic, CHEM14  CXR if complaints of chest pain to evaluate for infiltrate consistent with acute chest syndrome  Assess for need for bronchodilator if complaints of chest pain    Inpatient care plan recommendations:  Toradol 30 mg IV Q6 hrs ATC  NS bolus until euvolemic followed by D51/2NS at 125 cc/hr until tolerating PO well  PCA: Start with demand only Hydromorophone PCA 1 mg Q20 min with lockout 12 mg over 4 hrs Recommend titration of pain regimen until achieving pain score <5     Prior to discharge recommend 24 hours of PO pain medications to assess for sx of withdrawal with transition and continued pain control to prevent readmission    Transfuse if HcT< 18 and consider GI consult as patient's most recent cause for profound anemia is due to UC    Venofer orders in [facility]    There are no Patient Instructions on file for this visit.    No follow-ups on file.  --Patient instructed to call if symptoms are not improving or worsening      Electronically signed by Concha Norway, NP 07/17/2019   1:07 PM  [facility], Phone: [phone]      {TIME [phone] minutes was spent with the patient, patient representatives, and/or other attendees.       Billing codes for telephone visits,Routine E&M codes can be used for video visits  99441 5-10 minutes  99442 11-20 minutes  99443 > 20 minutes

## 2019-07-24 ENCOUNTER — Ambulatory Visit: Payer: Medicaid Other | Admitting: Internal Medicine

## 2019-07-24 ENCOUNTER — Telehealth: Payer: Self-pay

## 2019-07-24 NOTE — Telephone Encounter (Signed)
On 10/30 CM contacted t Cameron [name] to follow up on recently missed telemed appointments. He stated that he is doing well with no immediate health concerns. CM offered to text him prior to appointments as reminder and Cameron [name] agreed to see if this helps him remember appointment times. Providers made aware. CM encouraged him to call the office to reschedule. Cameron [name] verbalized understanding and stated he will reschedule himself as needed. No additional care management assistance needed at this time.

## 2019-07-25 ENCOUNTER — Other Ambulatory Visit: Payer: Self-pay

## 2019-07-25 MED FILL — Ergocalciferol Cap 1.25 MG (50000 Unit): ORAL | 28 days supply | Qty: 4 | Fill #1 | Status: AC

## 2019-07-25 MED FILL — Folic Acid Tab 1 MG: ORAL | 30 days supply | Qty: 30 | Fill #2 | Status: AC

## 2019-07-26 ENCOUNTER — Encounter: Payer: Self-pay | Admitting: Internal Medicine

## 2019-07-27 ENCOUNTER — Other Ambulatory Visit: Payer: Self-pay

## 2019-07-28 ENCOUNTER — Other Ambulatory Visit: Payer: Self-pay | Admitting: Internal Medicine

## 2019-07-28 ENCOUNTER — Encounter: Payer: Self-pay | Admitting: Internal Medicine

## 2019-07-28 DIAGNOSIS — D571 Sickle-cell disease without crisis: Secondary | ICD-10-CM

## 2019-07-28 NOTE — Telephone Encounter (Signed)
Confidential Drug Report  Search Terms: Aydyn Testerman, 08-07-1996   Search Date: 07/28/2019 08:51:40 AM   Searching on behalf of: [id number] - Tiffany L [name]   The Drug Utilization Report below displays all of the controlled substance prescriptions, if any, that your patient has filled in the last twelve months. The information displayed on this report is compiled from pharmacy submissions to the Department, and accurately reflects the information as submitted by the pharmacies.  This report was requested by: Lupita Leash   Reference #: [id number]   Others' Prescriptions  Patient Name: Cameron [name]   Birth Date: [birth year]   Address: [address] [place], [place] [address]   Sex: Male   Rx Written Rx Dispensed Drug Quantity Days Supply Prescriber Name Payment Method Dispenser   06/29/2019 06/29/2019 oxycodone hcl 10 mg tablet  150 [address], Minette Headland M Other [facility]   05/29/2019 05/29/2019 oxycodone hcl 10 mg tablet  150 25 [name], [name] Other [facility]   04/28/2019 04/28/2019 oxycodone hcl 10 mg tablet  150 [address] Minette Headland M Other [facility]   03/31/2019 03/31/2019 oxycodone hcl 10 mg tablet  150 25 Reinhardt, [place]   02/27/2019 02/27/2019 oxycodone hcl 10 mg tablet  150 25 [name], Rexene Edison MD [facility]   02/18/2019 02/19/2019 oxycodone hcl 10 mg tablet  30 5 [name], Rexene Edison MD Insurance [facility]   02/13/2019 02/13/2019 oxycodone hcl 10 mg tablet  30 5 [name], [name] Insurance [facility]   01/26/2019 01/26/2019 oxycodone hcl 10 mg tablet  84 14 [name], Rexene Edison MD Insurance [facility]   01/09/2019 01/10/2019 oxycodone hcl 10 mg tablet  84 14 [name], [name] Insurance [facility]   12/23/2018 12/24/2018 oxycodone hcl 10 mg tablet  84 14 [name], [name]  Insurance [facility]   12/09/2018 12/10/2018 oxycodone hcl 10 mg tablet  84 14 [name], [name] Insurance [facility]   11/24/2018 11/26/2018 oxycodone hcl 10 mg tablet  84 14 [name], Rexene Edison MD Insurance [facility]   11/14/2018 11/14/2018 oxycodone hcl 10 mg tablet  84 14 Reinhardt, [place] [facility]   10/31/2018 11/01/2018 oxycodone hcl 10 mg tablet  84 14 [name], Rexene Edison MD Insurance [facility]   10/16/2018 10/16/2018 oxycodone hcl 10 mg tablet  84 14 Reinhardt, [place] [facility]   09/26/2018 09/29/2018 oxycodone hcl 10 mg tablet  84 14 [name], [name] [place] [facility]   09/04/2018 09/11/2018 oxycodone hcl 10 mg tablet  27 [place], [place]   09/03/2018 09/04/2018 oxycodone hcl 10 mg tablet  40 20 Kathlene Cote M Insurance [facility]   08/20/2018 08/21/2018 oxycodone hcl 10 mg tablet  84 14 [name], Rexene Edison MD Insurance [facility] [place]   08/07/2018 08/07/2018 oxycodone hcl 10 mg tablet  84 14 [name], [name] Insurance [facility]   * - Drugs marked with an asterisk are compound drugs. If the compound drug is made up of more than one controlled substance, then each controlled substance will be a separate row  in the table.

## 2019-07-29 ENCOUNTER — Encounter: Payer: Self-pay | Admitting: Internal Medicine

## 2019-07-29 ENCOUNTER — Other Ambulatory Visit: Payer: Self-pay

## 2019-07-29 MED ORDER — OXYCODONE HCL 10 MG PO TABS *I*
10.0000 mg | ORAL_TABLET | ORAL | 0 refills | Status: DC | PRN
Start: 2019-07-29 — End: 2019-08-04
  Filled 2019-07-29: qty 42, 7d supply, fill #0

## 2019-07-30 ENCOUNTER — Ambulatory Visit: Payer: Medicaid Other | Admitting: Internal Medicine

## 2019-07-30 ENCOUNTER — Encounter: Payer: Self-pay | Admitting: Internal Medicine

## 2019-07-30 ENCOUNTER — Other Ambulatory Visit: Payer: Self-pay

## 2019-07-30 DIAGNOSIS — D564 Hereditary persistence of fetal hemoglobin [HPFH]: Secondary | ICD-10-CM

## 2019-07-30 DIAGNOSIS — D571 Sickle-cell disease without crisis: Secondary | ICD-10-CM

## 2019-07-30 NOTE — Progress Notes (Signed)
[facility]  [address]  [place] [address]  Phone: [phone]  Fax: [phone]    CHIEF COMPLAINT     Chief Complaint   Patient presents with    Follow-up       TELEMEDICINE CONSENT     Visit being conducted by Video in lieu of a face to face visit to minimize health care worker and patient exposure to COVID-19, and conserve PPE.    Location of Patient: home  Location of Telemedicine Provider: home / other  Other Participants in telemedicine encounter and roles: none     Consent was obtained from the patient to complete this telemedicine visit; including the potential for financial liability.  How did the patient provide consent? Verbal Consent Only    PRIMARY DIAGNOSIS   Sickle Cell disease    SUBJECTIVE   Sickle Cell vaso-occlusive crisis  Was having pain in his right shoulder and back pain  Never had right shoulder imaged before   Feeling good today- he is having pain that goes in and out over the last 2 weeks   Unknown reason why he is having pain   Drinking a lot more water and Gatorade often  Appetite is good- normal   Denies fevers, SOB, chest pain, cough, cold symptoms, sore throat  Denies abdominal pain- uses the bathroom about every 2-3 days- this is normal for him- denies constipation   denies blood in stool, denies mucus in stool    Current home management:   Non pharmacologic interventions:heat and oral hydration   Non opioid pain modifying medications: tylenol  and flexeril   Short acting Opioid pain medications:oxycodone 10 mg Q 4 hrs PRN- only taking it about 2-3 times/day. This is working well for him    Long acting Opioid pain medications: none     Current Pain score: There were no vitals filed for this visit.    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     Medications Reviewed and changes     Current Outpatient Medications:     oxyCODONE (ROXICODONE) 10 MG immediate release tablet, Take 1 tablet (10 mg total) by mouth every 4 hours as needed for Pain Max daily dose: 6 tablets, Disp: 150 tablet, Rfl: 0    folic acid (FOLVITE) 1 MG tablet, TAKE 1 TABLET (1 MG TOTAL) BY MOUTH DAILY, Disp: 30 tablet, Rfl: 5    mesalamine (ASACOL HD) 800 MG tablet, TAKE 3 TABLETS (2,400 MG TOTAL) BY MOUTH 2 TIMES DAILY, Disp: 180 tablet, Rfl: 2    ergocalciferol (DRISDOL) 50000 UNIT capsule, TAKE 1 CAPSULE (50,000 UNITS TOTAL) BY MOUTH ONCE A WEEK, Disp: 4 capsule, Rfl: 5    diclofenac (VOLTAREN) 1 % gel, Apply 4 g to affected area 4 times daily., Disp: 100 g, Rfl: 2    Elastic Bandages & Supports (ANKLE WRAP) MISC, Use PRN for right ankle. (Patient not taking: Reported on 11/05/2018), Disp: 2 each, Rfl: 0    ferrous sulfate 325 (65 FE) MG tablet, Take 1 tablet (325 mg total) by mouth 2 times daily (with meals), Disp: 100 tablet, Rfl: 3    acetaminophen (TYLENOL)  500 mg tablet, Take 500 mg by mouth every 6 hours as needed, Disp: , Rfl:     cyclobenzaprine (FLEXERIL) 10 MG tablet, Take 1 tablet (10 mg total) by mouth 3 times daily as needed for Muscle spasms (Patient not taking: Reported on 09/02/2018), Disp: 45 tablet, Rfl: 0    naloxone (NARCAN) 4 mg/0.1 mL nasal spray, Instill 1 spray in 1 nostril once for opioid reversal. Repeat in alternating nostrils with new package every 2-3 minutes until response, Disp: 2 each, Rfl: 5  Review of Systems   All other systems reviewed and are negative.      OBJECTIVE   PHYSICAL EXAM  This visit was performed during a pandemic event, and the physical exam was limited to my Video observation of this patient's organ systems and/or body areas.   GEN:  alert, looks comfortable. No distress  HEENT: scleral icterus absent   Pulmonary: no SOB, trouble  breathing         Lab results: 02/23/19  1352   WBC 7.5   Hemoglobin 5.9*   Hematocrit 21*   RBC 3.5*   Platelets 255         ASSESSMENT / DIAGNOSIS     Shonta was seen today for follow-up.    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  In addition Blood type O+ with antibodies anti-fya,jkb,lea  Outpatient Pain Regimen:  Long acting: none  Short acting; Oxy IR 10mg  Q4 hrs prn pain  Home Based titration regimen: none, needs assessment due to uncontrolled UC0  Adjunct therapies: none, holding NSAID due to ongoing LGI bleeding  Other: none    [facility]    ED/Infusion Center care plan recommendations:  Toradol 30 mg IV Q6 hrs ATC  NS 2 L IV bolus  Transfuse for HcT <18  Hydromorphone 2 mg IV Q30 min prn pain score>5  Recommend hospitalization is pain score remains >5 after 3 doses of Hydromorphone 2 mg IV    Labs on arrival: CBC, retic, CHEM14  CXR if complaints of chest pain to evaluate for infiltrate consistent with acute chest syndrome  Assess for need for bronchodilator if complaints of chest pain    Inpatient care plan recommendations:  Toradol 30 mg IV Q6 hrs ATC  NS bolus until euvolemic followed by D51/2NS at 125 cc/hr until tolerating PO well  PCA: Start with demand only Hydromorophone PCA 1 mg Q20 min with lockout 12 mg over 4 hrs Recommend titration of pain regimen until achieving pain score <5     Prior to discharge recommend 24 hours of PO pain medications to assess for sx of withdrawal with transition and continued pain control  to prevent readmission    Transfuse if HcT< 18 and consider GI consult as patient's most recent cause for profound anemia is due to UC    Venofer orders in Dr Concha NorwayPulcino    There are no Patient Instructions on file for this visit.    No follow-ups on file.  --Patient instructed to call  if symptoms are not improving or worsening      Electronically signed by Barbaraann Boys, NP 07/30/2019   3:58 PM  [facility], Phone: [phone]

## 2019-08-04 ENCOUNTER — Other Ambulatory Visit: Payer: Self-pay

## 2019-08-04 ENCOUNTER — Other Ambulatory Visit: Payer: Self-pay | Admitting: Internal Medicine

## 2019-08-04 DIAGNOSIS — D571 Sickle-cell disease without crisis: Secondary | ICD-10-CM

## 2019-08-04 MED ORDER — OXYCODONE HCL 10 MG PO TABS *I*
10.0000 mg | ORAL_TABLET | ORAL | 0 refills | Status: DC | PRN
Start: 2019-08-04 — End: 2019-08-25
  Filled 2019-08-04: qty 150, 25d supply, fill #0

## 2019-08-04 NOTE — Telephone Encounter (Signed)
Patient Name: Cameron [name]   Birth Date: 1995-11-20   Address: [address] [place], [place] [address]   Sex: Male   Rx Written Rx Dispensed Drug Quantity Days Supply Prescriber Name Payment Method Dispenser   07/29/2019 07/29/2019 oxycodone hcl 10 mg tablet  42 7 [place], Minette Headland M Other [facility]   06/29/2019 06/29/2019 oxycodone hcl 10 mg tablet  150 [address] Minette Headland M Other [facility]   05/29/2019 05/29/2019 oxycodone hcl 10 mg tablet  150 25 [name], [name] Other [facility]   04/28/2019 04/28/2019 oxycodone hcl 10 mg tablet  150 [address], Minette Headland M Other [facility]   03/31/2019 03/31/2019 oxycodone hcl 10 mg tablet  150 25 Reinhardt, [place]   02/27/2019 02/27/2019 oxycodone hcl 10 mg tablet  150 25 [name], Rexene Edison MD Insurance [facility]   02/18/2019 02/19/2019 oxycodone hcl 10 mg tablet  30 5 [name], [name] Insurance [facility]   02/13/2019 02/13/2019 oxycodone hcl 10 mg tablet  30 5 [name], [name] Insurance [facility]   01/26/2019 01/26/2019 oxycodone hcl 10 mg tablet  84 14 [name], [name] Insurance [facility]   01/09/2019 01/10/2019 oxycodone hcl 10 mg tablet  84 14 [name], [name] Insurance [facility]   12/23/2018 12/24/2018 oxycodone hcl 10 mg tablet  84 14 [name], [name] Insurance [facility]   12/09/2018 12/10/2018 oxycodone hcl 10 mg tablet  84 14 [name], [name] Insurance [facility]   11/24/2018 11/26/2018 oxycodone hcl 10 mg tablet  84 14 [name], Rexene Edison MD Insurance [facility]   11/14/2018 11/14/2018 oxycodone hcl 10 mg tablet  84 14 Reinhardt, [place] [facility]   10/31/2018 11/01/2018 oxycodone hcl 10 mg tablet  84 14 [name],  Rexene Edison MD Insurance [facility]   10/16/2018 10/16/2018 oxycodone hcl 10 mg tablet  84 14 Reinhardt, [place] [facility]   09/26/2018 09/29/2018 oxycodone hcl 10 mg tablet  84 14 [name], [name] [place] [facility]   09/04/2018 09/11/2018 oxycodone hcl 10 mg tablet  57 14 Reinhardt, [place]   09/03/2018 09/04/2018 oxycodone hcl 10 mg tablet  40 20 Kathlene Cote M Insurance [facility]   08/20/2018 08/21/2018 oxycodone hcl 10 mg tablet  84 14 [name], Rexene Edison MD Insurance [facility]   08/07/2018 08/07/2018 oxycodone hcl 10 mg tablet  84 14 [name], [name] Insurance [facility] [place]

## 2019-08-24 ENCOUNTER — Other Ambulatory Visit: Payer: Self-pay

## 2019-08-24 NOTE — Progress Notes (Signed)
On 11/30 CM attempted to contact Kassidy to assess for immediate care management needs. After several unanswered calls and texts throughout the month, CM failed to connect with him. Follow up plan is to continue efforts and support. CM will also follow up with his providers regarding and possible interventions need for care management regarding his care.

## 2019-08-25 ENCOUNTER — Other Ambulatory Visit: Payer: Self-pay | Admitting: Primary Care

## 2019-08-25 DIAGNOSIS — D571 Sickle-cell disease without crisis: Secondary | ICD-10-CM

## 2019-08-25 NOTE — Telephone Encounter (Signed)
Search Terms: Eashan Schipani, Sep 01, 1996   Search Date: 08/25/2019 09:57:12 AM   Searching on behalf of: [id number] - Forrestine Him [name]   The Drug Utilization Report below displays all of the controlled substance prescriptions, if any, that your patient has filled in the last twelve months. The information displayed on this report is compiled from pharmacy submissions to the Department, and accurately reflects the information as submitted by the pharmacies.  This report was requested by: Jennye Boroughs   Reference #: [id number]   Others' Prescriptions  Patient Name: Cameron [name]   Birth Date: 01/07/1996   Address: [address] [place],  [address]   Sex: Male   Rx Written Rx Dispensed Drug Quantity Days Supply Prescriber Name Payment Method Dispenser   08/04/2019 08/04/2019 oxycodone hcl 10 mg tablet  150 25 [name], [name] Other [facility]   07/29/2019 07/29/2019 oxycodone hcl 10 mg tablet  42 7 [place], Minette Headland M Other [facility]   06/29/2019 06/29/2019 oxycodone hcl 10 mg tablet  150 [address] Minette Headland M Other [facility]   05/29/2019 05/29/2019 oxycodone hcl 10 mg tablet  150 25 [name], [name] Other [facility]   04/28/2019 04/28/2019 oxycodone hcl 10 mg tablet  150 [address], Minette Headland M Other [facility]   03/31/2019 03/31/2019 oxycodone hcl 10 mg tablet  150 25 [place], [place] [facility]   02/27/2019 02/27/2019 oxycodone hcl 10 mg tablet  150 25 [name], Rexene Edison MD Insurance [facility]   02/18/2019 02/19/2019 oxycodone hcl 10 mg tablet  30 5 [name], [name] Insurance [facility]   02/13/2019 02/13/2019 oxycodone hcl 10 mg tablet  30 5 [name], [name] Insurance [facility]   01/26/2019 01/26/2019 oxycodone hcl 10 mg tablet  84 14 [name], Rexene Edison MD Insurance [facility]   01/09/2019 01/10/2019 oxycodone hcl 10 mg tablet  84 14 [name], [name] Insurance [facility]   12/23/2018 12/24/2018 oxycodone hcl 10 mg tablet  84 14 [name], Rexene Edison MD Insurance [facility]   12/09/2018 12/10/2018 oxycodone hcl 10 mg tablet  84 14 [name], Rexene Edison MD Insurance [facility]   11/24/2018 11/26/2018 oxycodone hcl 10 mg tablet  84 14 [name], Rexene Edison MD Insurance [facility]   11/14/2018 11/14/2018 oxycodone hcl 10 mg tablet  16 14 Reinhardt, [place] [facility]   10/31/2018 11/01/2018 oxycodone hcl 10 mg tablet  84 14 [name], Rexene Edison MD Insurance [facility]   10/16/2018 10/16/2018 oxycodone hcl 10 mg tablet  84 14 Reinhardt, [place] [facility]   09/26/2018 09/29/2018 oxycodone hcl 10 mg tablet  84 14 [name], [name] Cash [facility]   09/04/2018 09/11/2018 oxycodone hcl 10 mg tablet  72 [place], [place]   09/03/2018 09/04/2018 oxycodone hcl 10 mg tablet  40 20 [place], [place] [facility]

## 2019-08-26 ENCOUNTER — Other Ambulatory Visit: Payer: Self-pay

## 2019-08-26 MED ORDER — OXYCODONE HCL 10 MG PO TABS *I*
10.0000 mg | ORAL_TABLET | ORAL | 0 refills | Status: DC | PRN
Start: 2019-08-26 — End: 2019-09-27
  Filled 2019-08-26: qty 150, 25d supply, fill #0

## 2019-08-27 ENCOUNTER — Other Ambulatory Visit: Payer: Self-pay

## 2019-08-28 ENCOUNTER — Other Ambulatory Visit: Payer: Self-pay

## 2019-09-03 ENCOUNTER — Other Ambulatory Visit: Payer: Self-pay

## 2019-09-03 NOTE — Progress Notes (Signed)
On 12/10 CM sent Milind a MyChart message to offer support and assess for care management needs. CM has utilized this form of communication because he remains connected to the [facility], but fails to answers calls and texts from CM over the past several weeks. Follow up plan is to continue attempting to make contact for health home care management.

## 2019-09-11 ENCOUNTER — Telehealth: Payer: Self-pay

## 2019-09-11 NOTE — Telephone Encounter (Signed)
On 12/10 CM sent Cameron [name] a MyChart message to assess for immediate needs and encourage him to remain connected with care management. As of 12/18, Cameron [name] did not respond to CM. CM continues to have failed contact attempts via phone call, text message, and MyChart messages over the past few weeks. Follow up plan is to continue outreach efforts. CM will remain available to providers for intervention suggestions.

## 2019-09-24 ENCOUNTER — Other Ambulatory Visit: Payer: Self-pay

## 2019-09-24 NOTE — Progress Notes (Signed)
This month CM has failed to make meaningful contact with Cameron [name] due to multiple failed communication attempts. CM has noticed that Cameron [name] has not contacted the office much over the month of December as well. CM has tried phone calls, texts, and mychart messages over the past few weeks with no contact. CM has not been notified of any need for CM assistance from any [facility] providers at this time. CM will remain available for any immediate needs. Follow up plan is to continue outreach efforts.

## 2019-09-27 ENCOUNTER — Other Ambulatory Visit: Payer: Self-pay | Admitting: Primary Care

## 2019-09-27 DIAGNOSIS — D571 Sickle-cell disease without crisis: Secondary | ICD-10-CM

## 2019-09-28 ENCOUNTER — Other Ambulatory Visit: Payer: Self-pay

## 2019-09-28 ENCOUNTER — Encounter: Payer: Self-pay | Admitting: Primary Care

## 2019-09-28 MED FILL — Folic Acid Tab 1 MG: ORAL | 30 days supply | Qty: 30 | Fill #3 | Status: AC

## 2019-09-28 NOTE — Telephone Encounter (Signed)
Search Terms: Dashun Borre, 1996-04-21   Search Date: 09/28/2019 08:25:27 AM   Searching on behalf of: [id number] - [name]   The Drug Utilization Report below displays all of the controlled substance prescriptions, if any, that your patient has filled in the last twelve months. The information displayed on this report is compiled from pharmacy submissions to the Department, and accurately reflects the information as submitted by the pharmacies.  This report was requested by: [name]   Reference #: [id number]   Others' Prescriptions  Patient Name: Cameron [name]   Birth Date: [birth year]   Address: [address] [place], [place] [address]   Sex: Male   Rx Written Rx Dispensed Drug Quantity Days Supply Prescriber Name Payment Method Dispenser   08/26/2019 08/28/2019 oxycodone hcl 10 mg tablet  150 25 [name], [name] Other [facility]   08/04/2019 08/04/2019 oxycodone hcl 10 mg tablet  150 25 [name], [name] Other [facility]   07/29/2019 07/29/2019 oxycodone hcl 10 mg tablet  42 7 [place], Barkley Bruns M [facility]   06/29/2019 06/29/2019 oxycodone hcl 10 mg tablet  150 [address], Barkley Bruns M Other [facility]   05/29/2019 05/29/2019 oxycodone hcl 10 mg tablet  150 25 [name], [name] Other [facility]   04/28/2019 04/28/2019 oxycodone hcl 10 mg tablet  150 [address] Barkley Bruns M Other [facility]   03/31/2019 03/31/2019 oxycodone hcl 10 mg tablet  150 25 [name] Insurance [facility]   02/27/2019 02/27/2019 oxycodone hcl 10 mg tablet  150 25 [name], [name] MD Insurance [facility]   02/18/2019 02/19/2019 oxycodone hcl 10 mg tablet  30 5 [name], [name] Insurance [facility]   02/13/2019 02/13/2019 oxycodone hcl 10 mg tablet  30 5 [name], [name] Insurance [facility]   01/26/2019 01/26/2019 oxycodone hcl 10 mg tablet  84 14 [name], [name] MD Insurance [facility]   01/09/2019 01/10/2019 oxycodone hcl 10 mg tablet  84 14 [name], [name] Insurance [facility]   12/23/2018 12/24/2018 oxycodone hcl 10 mg tablet  84 14 [name], [name] MD Insurance [facility]   12/09/2018 12/10/2018 oxycodone hcl 10 mg tablet  84 14 [name], [name] MD Insurance [facility]   11/24/2018 11/26/2018 oxycodone hcl 10 mg tablet  84 14 [name], [name] MD Insurance [facility]   11/14/2018 11/14/2018 oxycodone hcl 10 mg tablet  84 14 [name] Insurance [facility]   10/31/2018 11/01/2018 oxycodone hcl 10 mg tablet  84 14 [name], [name] MD Insurance [facility]   10/16/2018 10/16/2018 oxycodone hcl 10 mg tablet  84 14 [name] Insurance [facility]   09/26/2018 09/29/2018 oxycodone hcl 10 mg tablet  84 14 [name], [name] Cash [facility]

## 2019-09-29 ENCOUNTER — Encounter: Payer: Self-pay | Admitting: Primary Care

## 2019-09-29 ENCOUNTER — Other Ambulatory Visit: Payer: Self-pay

## 2019-09-29 MED ORDER — OXYCODONE HCL 10 MG PO TABS *I*
10.0000 mg | ORAL_TABLET | ORAL | 0 refills | Status: DC | PRN
Start: 2019-09-29 — End: 2019-10-30
  Filled 2019-09-29: qty 150, 25d supply, fill #0

## 2019-09-30 ENCOUNTER — Encounter: Payer: Self-pay | Admitting: Internal Medicine

## 2019-10-09 ENCOUNTER — Other Ambulatory Visit: Payer: Self-pay

## 2019-10-09 NOTE — Progress Notes (Signed)
Participants involved in review of Plan: CM and [name] collaborated to review his care plan over the phone on 1/15.     Progress towards Goals: Cameron [name] made overall progress to his goal by contacting providers with medical needs and maintaining his health well enough to reduce avoidable ED utilization within the last 6 months.     Changes to Plan: CM changed Cameron [name] goal statement to My goal is to control my health care more independently and continue handling my sickle cell disease to keep working" due to his increased independence to maintain his job at [employer]. CM also included care coordination activities to capture any immediate care management need and interventions. No other significant changes to his plan were made at this time. Cameron [name] confirmed that the plan on file is still relevant to his life, health care, and care management goals.    Updates to Clients Strengths/Preferences: At this time his strengths and preferences remain the same. CM added employment as a strength.     Updates to Barriers: No additional barriers to add at this time.     Client agreement to changes: Cameron [name] agreed to goal statement, objectives, and interventions on the care plan. Verbal consent for care plan review has been provided by Cameron [name] due to COVID-19 safety precautions. Cameron [name] did not express any other adjustments during the review.    Plan to provide copy to client/providers: The unsinged copy is readily available upon request. CM will make reasonable effort to obtain a signed copy at a later date.

## 2019-10-16 ENCOUNTER — Other Ambulatory Visit: Payer: Self-pay

## 2019-10-16 NOTE — Progress Notes (Signed)
Assessment Utilized: On 1/15 CM and [name] completed a comprehensive care management assessment over the phone due to COVID-19 safety precautions.    Names/Roles of participants: CM and Cameron [name] were only participants in completing this form.    Description of Client participation: Cameron [name] was very engaged with CM and fully participated to complete the assessment. Cameron [name] was able to fully answer questions and spoke with CM honestly about personal information he provided.    Status of completion of assessment: The assessment was completed and finalized on 1/22.     Summary of findings relevant to Care Management work: Findings relevant to care management work at this time are assistance with balancing independence and working, following appointment schedules, and transportation to appointments as needed.    Next steps to address findings: Follow up plan is to notify Cameron [name] PCP of assessment completion and offer time for a case review to be conducted with multidisciplinary team. A copy of comprehensive assessment will be made available to [facility] and healthcare team upon request.

## 2019-10-21 ENCOUNTER — Ambulatory Visit: Payer: Medicaid Other | Admitting: Primary Care

## 2019-10-21 ENCOUNTER — Encounter: Payer: Self-pay | Admitting: Primary Care

## 2019-10-21 DIAGNOSIS — D571 Sickle-cell disease without crisis: Secondary | ICD-10-CM

## 2019-10-21 DIAGNOSIS — D564 Hereditary persistence of fetal hemoglobin [HPFH]: Secondary | ICD-10-CM

## 2019-10-28 ENCOUNTER — Other Ambulatory Visit: Payer: Self-pay | Admitting: Internal Medicine

## 2019-10-28 DIAGNOSIS — D571 Sickle-cell disease without crisis: Secondary | ICD-10-CM

## 2019-10-28 NOTE — Telephone Encounter (Signed)
Search Terms: Valin Massie, 07/29/1996   Search Date: 10/28/2019 14:33:56 PM   Searching on behalf of: [id number] - [name]   The Drug Utilization Report below displays all of the controlled substance prescriptions, if any, that your patient has filled in the last twelve months. The information displayed on this report is compiled from pharmacy submissions to the Department, and accurately reflects the information as submitted by the pharmacies.  This report was requested by: [name]   Reference #: [id number]   Others' Prescriptions  Patient Name: Cameron [name]   Birth Date: [birth year]   Address: [address] [place], [place] [address]   Sex: Male   Rx Written Rx Dispensed Drug Quantity Days Supply Prescriber Name Payment Method Dispenser   09/29/2019 09/29/2019 oxycodone hcl 10 mg tablet  150 [address] Barkley Bruns M Other [facility]   08/26/2019 08/28/2019 oxycodone hcl 10 mg tablet  150 25 [name], [name] Other [facility]   08/04/2019 08/04/2019 oxycodone hcl 10 mg tablet  150 25 [name], [name] Other [facility]   07/29/2019 07/29/2019 oxycodone hcl 10 mg tablet  42 7 [place], Barkley Bruns M Other [facility]   06/29/2019 06/29/2019 oxycodone hcl 10 mg tablet  150 [address], Barkley Bruns M Other [facility]   05/29/2019 05/29/2019 oxycodone hcl 10 mg tablet  150 25 [name], [name] Other [facility]   04/28/2019 04/28/2019 oxycodone hcl 10 mg tablet  150 [address] Barkley Bruns M Other [facility]   03/31/2019 03/31/2019 oxycodone hcl 10 mg tablet  150 25 [name] Insurance [facility]   02/27/2019 02/27/2019 oxycodone hcl 10 mg tablet  150 25 [name], [name] MD Insurance [facility]   02/18/2019 02/19/2019 oxycodone hcl 10 mg tablet  30 5 [name], [name] MD Insurance [facility]   02/13/2019 02/13/2019 oxycodone hcl 10 mg tablet  30 5 [name], [name] Insurance [facility]   01/26/2019 01/26/2019 oxycodone hcl 10 mg tablet  84 14 [name], [name] MD Insurance [facility]   01/09/2019 01/10/2019 oxycodone hcl 10 mg tablet  84 14 [name], [name] MD Insurance [facility]   12/23/2018 12/24/2018 oxycodone hcl 10 mg tablet  84 14 [name], [name] MD Insurance [facility]   12/09/2018 12/10/2018 oxycodone hcl 10 mg tablet  84 14 [name], [name] MD Insurance [facility]   11/24/2018 11/26/2018 oxycodone hcl 10 mg tablet  84 14 [name], [name] MD Insurance [facility]   11/14/2018 11/14/2018 oxycodone hcl 10 mg tablet  84 14 [name] Insurance [facility]   10/31/2018 11/01/2018 oxycodone hcl 10 mg tablet  84 14 [name], [name] Insurance [facility]

## 2019-10-30 ENCOUNTER — Encounter: Payer: Self-pay | Admitting: Internal Medicine

## 2019-10-30 ENCOUNTER — Other Ambulatory Visit: Payer: Self-pay | Admitting: Internal Medicine

## 2019-10-30 ENCOUNTER — Encounter: Payer: Self-pay | Admitting: Primary Care

## 2019-10-30 ENCOUNTER — Other Ambulatory Visit: Payer: Self-pay

## 2019-10-30 DIAGNOSIS — D571 Sickle-cell disease without crisis: Secondary | ICD-10-CM

## 2019-10-30 MED FILL — Folic Acid Tab 1 MG: ORAL | 30 days supply | Qty: 30 | Fill #4 | Status: AC

## 2019-10-30 NOTE — Telephone Encounter (Addendum)
Error, please disregard.

## 2019-10-31 ENCOUNTER — Encounter: Payer: Self-pay | Admitting: Internal Medicine

## 2019-11-02 ENCOUNTER — Encounter: Payer: Self-pay | Admitting: Internal Medicine

## 2019-11-02 ENCOUNTER — Other Ambulatory Visit: Payer: Self-pay

## 2019-11-02 MED ORDER — OXYCODONE HCL 10 MG PO TABS *I*
10.0000 mg | ORAL_TABLET | ORAL | 0 refills | Status: DC | PRN
Start: 2019-11-02 — End: 2019-11-26
  Filled 2019-11-02: qty 150, 25d supply, fill #0

## 2019-11-02 NOTE — Telephone Encounter (Signed)
Patient is aware medication was sent to pharmacy.   No further needs.

## 2019-11-02 NOTE — Telephone Encounter (Signed)
Patient reports he is currently out of medication.   Patient has requested this gets sent into pharmacy right away.   Patient would like a return call once sent in.    Phone: [phone]

## 2019-11-02 NOTE — Telephone Encounter (Signed)
rx sent

## 2019-11-02 NOTE — Telephone Encounter (Signed)
Writer spoke with patient. Patient is following up on medication request. Patient is also looking to speak to provider regarding this.     Phone: [phone]

## 2019-11-02 NOTE — Telephone Encounter (Signed)
Patient is calling to follow up on medication request. 

## 2019-11-02 NOTE — Telephone Encounter (Signed)
Patient is aware medication has been sent in.

## 2019-11-02 NOTE — Telephone Encounter (Signed)
Writer spoke with patient.  Patient is following up on medication and has requested a call back.  Patient requested medication 2/3.    Writer paged provider.    Phone: [phone]

## 2019-11-03 ENCOUNTER — Ambulatory Visit: Payer: Medicaid Other | Admitting: Primary Care

## 2019-11-03 ENCOUNTER — Other Ambulatory Visit: Payer: Self-pay

## 2019-11-03 DIAGNOSIS — K5190 Ulcerative colitis, unspecified, without complications: Secondary | ICD-10-CM

## 2019-11-03 DIAGNOSIS — D571 Sickle-cell disease without crisis: Secondary | ICD-10-CM

## 2019-11-03 DIAGNOSIS — D564 Hereditary persistence of fetal hemoglobin [HPFH]: Secondary | ICD-10-CM

## 2019-11-03 NOTE — Progress Notes (Signed)
[facility]  [address]  [place] [address]  Phone: [phone]  Fax: [phone]    CHIEF COMPLAINT   No chief complaint on file.    Subjective   TELEMEDICINE CONSENT     Visit being conducted by Video in lieu of a face to face visit to minimize health care worker and patient exposure to COVID-19, and conserve PPE.    Location of Patient: home  Location of Telemedicine Provider: hospital / clinical location  Other Participants in telemedicine encounter and roles: none    Consent was obtained from the patient to complete this telemedicine visit; including the potential for financial liability.  How did the patient provide consent? 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    Taking about 4 tab per day and when ran out took acetaminophen but not as effective and would only work for an hour or two   Cyclobenzaprine is too sedating at work     Current home management:   Non pharmacologic interventions:rest and oral hydration   Non opioid pain modifying medications: ibuprofen  and tylenol    Short acting Opioid pain medications:oxycodone 10 mg Q 4 hr   Long acting Opioid pain medications: None    Emergency Room visits for this event: 0  Hospitalizations for this event: 0    Current disease modifying therapy: none    Ulcerative colitis:  Has not been bothering him much  Has not been taking the meidcations for this consistently - taking it when having sx of flare up  Taking his iron and vitamin D but has not been taking mesalamine (if taking without eating has abdominal pain so has been missing the morning dose because he doesn't eat)        Medications Reviewed and changes     Current Outpatient Medications:     oxyCODONE  (ROXICODONE) 10 MG immediate release tablet, Take 1 tablet (10 mg total) by mouth every 4 hours as needed for Pain Max daily dose: 6 tablets, Disp: 150 tablet, Rfl: 0    folic acid (FOLVITE) 1 MG tablet, TAKE 1 TABLET (1 MG TOTAL) BY MOUTH DAILY, Disp: 30 tablet, Rfl: 5    mesalamine (ASACOL HD) 800 MG tablet, TAKE 3 TABLETS (2,400 MG TOTAL) BY MOUTH 2 TIMES DAILY, Disp: 180 tablet, Rfl: 2    ergocalciferol (DRISDOL) 50000 UNIT capsule, TAKE 1 CAPSULE (50,000 UNITS TOTAL) BY MOUTH ONCE A WEEK, Disp: 4 capsule, Rfl: 5    diclofenac (VOLTAREN) 1 % gel, Apply 4 g to affected area 4 times daily., Disp: 100 g, Rfl: 2    Elastic Bandages & Supports (ANKLE WRAP) MISC, Use PRN for right ankle. (Patient not taking: Reported on 11/05/2018), Disp: 2 each, Rfl: 0    ferrous sulfate 325 (65 FE) MG tablet, Take 1 tablet (325 mg total) by mouth 2 times daily (with meals), Disp: 100 tablet, Rfl: 3    acetaminophen (TYLENOL) 500 mg tablet, Take 500 mg by mouth every 6 hours as needed, Disp: , Rfl:     cyclobenzaprine (FLEXERIL) 10 MG tablet, Take 1 tablet (10 mg total) by mouth 3 times daily as needed for  Muscle spasms (Patient not taking: Reported on 09/02/2018), Disp: 45 tablet, Rfl: 0    naloxone (NARCAN) 4 mg/0.1 mL nasal spray, Instill 1 spray in 1 nostril once for opioid reversal. Repeat in alternating nostrils with new package every 2-3 minutes until response, Disp: 2 each, Rfl: 5  Review of Systems as per HPI      Objective   OBJECTIVE   PHYSICAL EXAM  This visit was performed during a pandemic event, and the physical exam was limited to my Video observation of this patient's organ systems and/or body areas.   GEN:  alert, looks mildly uncomfortable  HEENT: scleral icterus absent        Lab results: 02/23/19  1352   WBC 7.5   Hemoglobin 5.9*   Hematocrit 21*   RBC 3.5*   Platelets 255           ASSESSMENT / DIAGNOSIS     Diagnoses and all orders for this visit:    Sickle cell disease with HPFH  Continue current pain  management for now  Would like to facilitate in office visit for full exam and scheduled as such  Based on in office assessment will decide on possible imaging with plane films      Ulcerative colitis  Discussed need to improve medication adherence for UC and outlined strategy - take masalamine with lunch and dinner to manager abdominal pain that was occurring when taking on empty stomach  Given information for GI consultation follow up - last colonoscopy 5/18 due for repeat assessment  Chronic iron deficiency due to UC, last venofer infusion 2/20  -     Ferritin; Future  -     IRON; Future  -     TIBC; Future  -     CBC and differential; Future  -     Comprehensive metabolic panel; Future            Pain management treatment plan   Patient has co diagnosis of ulcerative colitis dx age 24.  In addition Blood type O+ with antibodies anti-fya,jkb,lea  Outpatient Pain Regimen:  Long acting: none  Short acting; Oxy IR 10mg  Q4 hrs prn pain  Home Based titration regimen: none, needs assessment due to uncontrolled UC0  Adjunct therapies: none, holding NSAID due to ongoing LGI bleeding  Other: none    Preferred hospital: [facility] care plan recommendations:  Toradol 30 mg IV Q6 hrs ATC  NS 2 L IV bolus  Transfuse for HcT <18  Hydromorphone 2 mg IV Q30 min prn pain score>5  Recommend hospitalization is pain score remains >5 after 3 doses of Hydromorphone 2 mg IV    Labs on arrival: CBC, retic, CHEM14  CXR if complaints of chest pain to evaluate for infiltrate consistent with acute chest syndrome  Assess for need for bronchodilator if complaints of chest pain    Inpatient care plan recommendations:  Toradol 30 mg IV Q6 hrs ATC  NS bolus until euvolemic followed by D51/2NS at 125 cc/hr until tolerating PO well  PCA: Start with demand only Hydromorophone PCA 1 mg Q20 min with lockout 12 mg over 4 hrs Recommend titration of pain regimen until achieving pain score <5     Prior to discharge recommend 24  hours of PO pain medications to assess for sx of withdrawal with transition and continued pain control to prevent readmission    Transfuse if HcT< 18 and consider GI consult as patient's most recent  cause for profound anemia is due to UC    Venofer orders in Dr Concha Norway    --Patient instructed to call if symptoms are not improving or worsening      Electronically signed by Benay Pillow, MD 11/03/2019   4:10 PM  [facility], Phone: [phone]

## 2019-11-03 NOTE — Progress Notes (Signed)
On 2/9 CM text Viet to remind him of video visit with PCP Tiffany [name] today at 4:00. Correll verbalized understanding and confirmed that he had a timer set as a reminder as well. CM also confirmed [place] new address at this time and updated his chart to include new apartment address. CM confirmed that Sloan was able to attend the appointment. Afterwards, CM reviewed progress notes for any possible care management interventions that may have come up. No immediate needs identified by provider at this time. Laverne confirmed that he is doing well and could benefit from additional appointment reminders from CM in the future to continue overcoming barriers connecting to providers.

## 2019-11-06 ENCOUNTER — Other Ambulatory Visit: Payer: Self-pay

## 2019-11-23 ENCOUNTER — Ambulatory Visit: Payer: Medicaid Other | Admitting: Oncology

## 2019-11-23 ENCOUNTER — Other Ambulatory Visit: Payer: Self-pay | Admitting: Oncology

## 2019-11-23 DIAGNOSIS — D571 Sickle-cell disease without crisis: Secondary | ICD-10-CM

## 2019-11-23 DIAGNOSIS — M545 Low back pain, unspecified: Secondary | ICD-10-CM

## 2019-11-23 NOTE — Patient Instructions (Signed)
Please obtain blood work before appointment with Dr. Concha [place] next week  Call for refil ~3 days before it's due

## 2019-11-23 NOTE — Progress Notes (Signed)
[place] COMPLAINT     Chief Complaint   Patient presents with    Follow-up       Subjective     TELEMEDICINE CONSENT     Visit being conducted by Video in lieu of a face to face visit to minimize health care worker and patient exposure to COVID-19, and conserve PPE.    Location of Patient: home  Location of Telemedicine Provider: hospital / clinical location  Other Participants in telemedicine encounter and roles: none    Consent was obtained from the patient to complete this telemedicine visit; including the potential for financial liability.  How did the patient provide consent? Verbal Consent Only    SUBJECTIVE     Back Pain:  -Continues to have daily back pain  -Pain is sickle cell in nature  -Relieved with current pain regimen  -Takes 10mg  oxycodone PRN for back pain  -Is due for refill at end of this week    Labs:  -Did not have labs drawn yet  -will go today or tomorrow morning  -Will give urine sample at that time    Review of Systems   Constitutional: Negative for chills and fever.   Respiratory: Negative for shortness of breath.    Cardiovascular: Negative for chest pain.   Musculoskeletal: Positive for back pain.         MEDICATIONS     Current Outpatient Medications   Medication Sig    oxyCODONE (ROXICODONE) 10 MG immediate release tablet Take 1 tablet (10 mg total) by mouth every 4 hours as needed for Pain Max daily dose: 6 tablets    folic acid (FOLVITE) 1 MG tablet TAKE 1 TABLET (1 MG TOTAL) BY MOUTH DAILY    mesalamine (ASACOL HD) 800 MG tablet TAKE 3 TABLETS (2,400 MG TOTAL) BY MOUTH 2 TIMES DAILY    diclofenac (VOLTAREN) 1 % gel Apply 4 g to affected area 4 times daily.    ferrous sulfate 325 (65 FE) MG tablet Take 1 tablet (325 mg total) by mouth 2 times daily (with meals)    acetaminophen (TYLENOL) 500 mg tablet Take 500 mg by mouth every 6 hours as needed    naloxone (NARCAN) 4 mg/0.1 mL nasal spray Instill 1 spray in 1 nostril once  for opioid reversal. Repeat in alternating nostrils with new package every 2-3 minutes until response     Medications reviewed at today's visit       Objective     OBJECTIVE   PHYSICAL EXAM  This visit was performed during a pandemic event, and the physical exam was limited to my Video observation of this patient's organ systems and/or body areas.   GEN:  Awake and alert, conversant  No respiratory distress  Respiratory effort normal  Speaking in full sentences    Physical Exam         ASSESSMENT   Today we discussed the assessment and management of the following diagnosis:    ICD-10-CM ICD-9-CM   1. Sickle cell disease  D57.1 282.60   2. Low back pain  M54.5 724.2       PLAN      1. Sickle cell disease  -Will obtain routine labwork needed today    - Pain clinic profile-Urine (Includes Oxycodone); Future  - Comprehensive metabolic panel; Future  - CBC and differential; Future  - TIBC; Future  - IRON; Future  - Ferritin; Future    2.  Low back pain  -Well controlled with current regimen; no changes today  -not due for refill at time of visit         Orders Placed This Encounter    Pain clinic profile-Urine (Includes Oxycodone)    Comprehensive metabolic panel    CBC and differential    TIBC    IRON    Ferritin       There are no Patient Instructions on file for this visit.           Time: I personally spent 12 minutes on the calendar day of the encounter, including pre and post visit work.      Follow up next week with PCP arranged for lab review    Palma Holter, NP

## 2019-11-24 ENCOUNTER — Other Ambulatory Visit: Payer: Self-pay

## 2019-11-26 ENCOUNTER — Other Ambulatory Visit: Payer: Self-pay | Admitting: Primary Care

## 2019-11-26 ENCOUNTER — Other Ambulatory Visit: Payer: Self-pay

## 2019-11-26 DIAGNOSIS — D571 Sickle-cell disease without crisis: Secondary | ICD-10-CM

## 2019-11-26 NOTE — Telephone Encounter (Signed)
Confidential Drug Report  Search Terms: Jefferie Holston, January 15, 1996   Search Date: 11/26/2019 08:25:38 AM   Searching on behalf of: [id number] - Tiffany L [name]   The Drug Utilization Report below displays all of the controlled substance prescriptions, if any, that your patient has filled in the last twelve months. The information displayed on this report is compiled from pharmacy submissions to the Department, and accurately reflects the information as submitted by the pharmacies.  This report was requested by: Reece Packer   Reference #: [id number]   Others' Prescriptions  Patient Name: Cameron [name]   Birth Date: [birth year]   Address: [address] [place], [place] [address]   Sex: Male   Rx Written Rx Dispensed Drug Quantity Days Supply Prescriber Name Payment Method Dispenser   11/02/2019 11/02/2019 oxycodone hcl 10 mg tablet  150 25 [name], [name] Other [facility]   09/29/2019 09/29/2019 oxycodone hcl 10 mg tablet  150 [address], Barkley Bruns M Other [facility]   08/26/2019 08/28/2019 oxycodone hcl 10 mg tablet  150 25 [name], [name] Other [facility]   08/04/2019 08/04/2019 oxycodone hcl 10 mg tablet  150 25 [name], [name] Other [facility]   07/29/2019 07/29/2019 oxycodone hcl 10 mg tablet  42 7 [place], Barkley Bruns M Other [facility]   06/29/2019 06/29/2019 oxycodone hcl 10 mg tablet  150 [address] Barkley Bruns M Other [facility]   05/29/2019 05/29/2019 oxycodone hcl 10 mg tablet  150 25 [name], [name] Other [facility]   04/28/2019 04/28/2019 oxycodone hcl 10 mg tablet  150 [address], Barkley Bruns M Other [facility]   03/31/2019 03/31/2019 oxycodone hcl 10 mg tablet  150 25 [name] [facility]   02/27/2019 02/27/2019 oxycodone hcl 10 mg tablet  150 25 [name], [name] MD [facility]   02/18/2019 02/19/2019 oxycodone hcl 10 mg tablet  30 5 [name], [name] Insurance [facility]   02/13/2019 02/13/2019 oxycodone hcl 10 mg tablet  30 5 [name], [name] Insurance [facility]   01/26/2019 01/26/2019 oxycodone hcl 10 mg tablet  84 14 [name], [name] MD Insurance [facility]   01/09/2019 01/10/2019 oxycodone hcl 10 mg tablet  84 14 [name], [name] MD Insurance [facility]   12/23/2018 12/24/2018 oxycodone hcl 10 mg tablet  84 14 [name], [name] MD Insurance [facility]   12/09/2018 12/10/2018 oxycodone hcl 10 mg tablet  84 14 [name], [name] Insurance [facility]   * - Drugs marked with an asterisk are compound drugs. If the compound drug is made up of more than one controlled substance, then each controlled substance will be a separate row in the table.

## 2019-11-27 ENCOUNTER — Encounter: Payer: Self-pay | Admitting: Primary Care

## 2019-11-27 ENCOUNTER — Other Ambulatory Visit: Payer: Self-pay

## 2019-11-27 MED ORDER — OXYCODONE HCL 10 MG PO TABS *I*
10.0000 mg | ORAL_TABLET | ORAL | 0 refills | Status: DC | PRN
Start: 2019-11-27 — End: 2019-12-26
  Filled 2019-11-27: qty 150, 25d supply, fill #0

## 2019-11-27 NOTE — Telephone Encounter (Signed)
Patient called stating that he is out of medication and would like to know the status of this.

## 2019-12-02 ENCOUNTER — Other Ambulatory Visit
Admission: RE | Admit: 2019-12-02 | Discharge: 2019-12-02 | Disposition: A | Discharge status: Home / Self Care | Payer: Medicaid Other | Source: Ambulatory Visit | Attending: Oncology | Admitting: Oncology

## 2019-12-02 ENCOUNTER — Other Ambulatory Visit: Payer: Self-pay | Admitting: Gastroenterology

## 2019-12-02 DIAGNOSIS — D571 Sickle-cell disease without crisis: Secondary | ICD-10-CM | POA: Insufficient documentation

## 2019-12-02 LAB — COMPREHENSIVE METABOLIC PANEL
ALT: 155 U/L — ABNORMAL HIGH (ref 0–50)
AST: 116 U/L — ABNORMAL HIGH (ref 0–50)
Albumin: 3.3 g/dL — ABNORMAL LOW (ref 3.5–5.2)
Alk Phos: 1151 U/L — ABNORMAL HIGH (ref 40–130)
Anion Gap: 7 (ref 7–16)
Bilirubin,Total: 1.5 mg/dL — ABNORMAL HIGH (ref 0.0–1.2)
CO2: 24 mmol/L (ref 20–28)
Calcium: 8.7 mg/dL — ABNORMAL LOW (ref 9.3–10.5)
Chloride: 104 mmol/L (ref 96–108)
Creatinine: 0.93 mg/dL (ref 0.67–1.17)
GFR,Black: 134 *
GFR,Caucasian: 116 *
Glucose: 89 mg/dL (ref 60–99)
Lab: 7 mg/dL (ref 6–20)
Potassium: 3.9 mmol/L (ref 3.3–5.1)
Sodium: 135 mmol/L (ref 133–145)
Total Protein: 8 g/dL — ABNORMAL HIGH (ref 6.3–7.7)

## 2019-12-02 LAB — CBC AND DIFFERENTIAL
Baso # K/uL: 0.1 10*3/uL (ref 0.0–0.1)
Basophil %: 1.3 %
Eos # K/uL: 0.4 10*3/uL (ref 0.0–0.5)
Eosinophil %: 5.9 %
Hematocrit: 22 % — ABNORMAL LOW (ref 40–51)
Hemoglobin: 6.3 g/dL — ABNORMAL LOW (ref 13.7–17.5)
IMM Granulocytes #: 0 10*3/uL (ref 0.0–0.0)
IMM Granulocytes: 0.3 %
Lymph # K/uL: 1.5 10*3/uL (ref 1.3–3.6)
Lymphocyte %: 21.6 %
MCH: 18 pg — ABNORMAL LOW (ref 26–32)
MCHC: 29 g/dL — ABNORMAL LOW (ref 32–37)
MCV: 61 fL — ABNORMAL LOW (ref 79–92)
Mono # K/uL: 0.6 10*3/uL (ref 0.3–0.8)
Monocyte %: 8.6 %
Neut # K/uL: 4.4 10*3/uL (ref 1.8–5.4)
Nucl RBC # K/uL: 0 10*3/uL (ref 0.0–0.0)
Nucl RBC %: 0 /100 WBC (ref 0.0–0.2)
Platelets: 412 10*3/uL — ABNORMAL HIGH (ref 150–330)
RBC: 3.5 MIL/uL — ABNORMAL LOW (ref 4.6–6.1)
RDW: 20.5 % — ABNORMAL HIGH (ref 11.6–14.4)
Seg Neut %: 62.3 %
WBC: 7 10*3/uL (ref 4.2–9.1)

## 2019-12-02 LAB — UNABLE TO VOID

## 2019-12-02 LAB — TIBC
Iron: 22 ug/dL — ABNORMAL LOW (ref 45–170)
TIBC: 389 ug/dL (ref 250–450)
Transferrin Saturation: 6 % — ABNORMAL LOW (ref 20–55)

## 2019-12-02 LAB — FERRITIN: Ferritin: 13 ng/mL — ABNORMAL LOW (ref 20–250)

## 2019-12-03 ENCOUNTER — Ambulatory Visit: Payer: Medicaid Other | Admitting: Primary Care

## 2019-12-08 ENCOUNTER — Ambulatory Visit: Payer: Medicaid Other | Admitting: Primary Care

## 2019-12-09 ENCOUNTER — Encounter: Payer: Self-pay | Admitting: Primary Care

## 2019-12-15 ENCOUNTER — Ambulatory Visit: Payer: Medicaid Other | Admitting: Internal Medicine

## 2019-12-16 ENCOUNTER — Other Ambulatory Visit: Payer: Self-pay

## 2019-12-16 ENCOUNTER — Ambulatory Visit: Payer: Medicaid Other | Admitting: Oncology

## 2019-12-16 ENCOUNTER — Encounter: Payer: Self-pay | Admitting: Internal Medicine

## 2019-12-16 ENCOUNTER — Other Ambulatory Visit: Payer: Self-pay | Admitting: Oncology

## 2019-12-16 DIAGNOSIS — R7401 Elevation of levels of liver transaminase levels: Secondary | ICD-10-CM

## 2019-12-16 DIAGNOSIS — D564 Hereditary persistence of fetal hemoglobin [HPFH]: Secondary | ICD-10-CM

## 2019-12-16 DIAGNOSIS — D571 Sickle-cell disease without crisis: Secondary | ICD-10-CM

## 2019-12-16 DIAGNOSIS — K529 Noninfective gastroenteritis and colitis, unspecified: Secondary | ICD-10-CM

## 2019-12-16 DIAGNOSIS — R519 Headache, unspecified: Secondary | ICD-10-CM

## 2019-12-16 MED ORDER — SUMATRIPTAN SUCCINATE 50 MG PO TABS *I*
50.0000 mg | ORAL_TABLET | ORAL | 0 refills | Status: DC | PRN
Start: 2019-12-16 — End: 2022-01-11
  Filled 2019-12-16: qty 2, 1d supply, fill #0

## 2019-12-16 NOTE — Patient Instructions (Addendum)
Headaches:  -Likely migraine or strss  -Keep a headache journal: document how long you slept the night before, foods you ate, pain rating, what the headache feels like, how long it lasts and what made it go away  -Do not take tylenol right now  -If tylenol is working let us know we'll try a migraine medicine  -If lasting more than a month let us know  -If worst headache of life, loss of vision, weakness, thunderclap headache, call and go to ED  -Do not take Alleeve for headache  -Trial imitrex if tylenol doesn't work.      Labs for infusion:  Vit D, coags, phos, mag, cmp, hepatitis panel

## 2019-12-17 ENCOUNTER — Other Ambulatory Visit: Payer: Self-pay | Admitting: Oncology

## 2019-12-17 ENCOUNTER — Telehealth: Payer: Self-pay | Admitting: Primary Care

## 2019-12-17 NOTE — Telephone Encounter (Signed)
Order updated

## 2019-12-17 NOTE — Telephone Encounter (Signed)
[facility] TELEPHONE TRIAGE     Reason for call: Writer spoke to Iselin, Systems developer at [employer]. Patient scheduled next available appointment to receive urgently requested CT scan. Patient scheduled on 04/03 at 7:30 AM. Jill Side relayed imaging staff can work to reschedule the patient for a sooner appointment if CT order is updated to "stat" status.     Name of patient: Cameron [name]  Organization: [facility]

## 2019-12-17 NOTE — Progress Notes (Signed)
Please obtain cmp, vit d, coags, mag phos at iron infusion appointment.    Thank you!

## 2019-12-18 ENCOUNTER — Ambulatory Visit: Payer: Medicaid Other

## 2019-12-18 ENCOUNTER — Encounter: Payer: Self-pay | Admitting: Oncology

## 2019-12-18 ENCOUNTER — Other Ambulatory Visit: Payer: Self-pay | Admitting: Oncology

## 2019-12-18 NOTE — Progress Notes (Signed)
[facility]    TELEMEDICINE VISIT  CHIEF COMPLAINT     Chief Complaint   Patient presents with    Follow-up     SCD and labwork       Subjective     TELEMEDICINE CONSENT     Visit being conducted by Video in lieu of a face to face visit to minimize health care worker and patient exposure to COVID-19, and conserve PPE.    Location of Patient: home  Location of Telemedicine Provider: hospital / clinical location  Other Participants in telemedicine encounter and roles: none    Consent was obtained from the patient to complete this telemedicine visit; including the potential for financial liability.  How did the patient provide consent? Verbal Consent Only    SUBJECTIVE     Sickle Cell Disease:  -Pain level controlled and maintaing on current meds  -Taking oxycodone a couple times a day  -No chest pain, shortness of breath  -Pain is in back and sickle cell in nature    Headaches:  -Having headaches very couple days - aching pain   -Lasts for several hours - goes away on it's own  -Headache is in forehead both sides  -Sensitive to light, not sound  -No nausea or vomiting  -Takes aleeve and headaches go away  -No sinus pain or congestion  -No numbess, tingling, weakness, or difficulty with grasping  -Not the worst headache of life  -No confusion during them  -Occur during the day      Ulcerative Colitis  -Not having an bloody bowel movements  -No abdominal pain  -Taking mesalamine 3 tablets every 2 times a day-  -Taking with food or he gets nauseous  -Remembers 4-5 days a week    BloodWork:  -elevated liver enzymes  -No jaunidce, itching, stool changes  -No RUQ      MEDICATIONS     Current Outpatient Medications   Medication Sig    SUMAtriptan (IMITREX) 50 MG tablet Take 1 tablet (50 mg total) by mouth as needed for Migraine Take at onset of headache. May repeat once in 2 hours.    oxyCODONE (ROXICODONE) 10 MG immediate release tablet Take 1 tablet (10 mg total) by mouth every 4 hours as needed  for Pain Max daily dose: 6 tablets    folic acid (FOLVITE) 1 MG tablet TAKE 1 TABLET (1 MG TOTAL) BY MOUTH DAILY    mesalamine (ASACOL HD) 800 MG tablet TAKE 3 TABLETS (2,400 MG TOTAL) BY MOUTH 2 TIMES DAILY    diclofenac (VOLTAREN) 1 % gel Apply 4 g to affected area 4 times daily.    ferrous sulfate 325 (65 FE) MG tablet Take 1 tablet (325 mg total) by mouth 2 times daily (with meals)    acetaminophen (TYLENOL) 500 mg tablet Take 500 mg by mouth every 6 hours as needed    naloxone (NARCAN) 4 mg/0.1 mL nasal spray Instill 1 spray in 1 nostril once for opioid reversal. Repeat in alternating nostrils with new package every 2-3 minutes until response     Medications reviewed at today's visit       Objective     OBJECTIVE   PHYSICAL EXAM  This visit was performed during a pandemic event, and the physical exam was limited to my Video observation of this patient's organ systems and/or body areas.   GEN:  Awake alert conversant  No distress    Physical Exam  Constitutional:  General: He is not in acute distress.     Appearance: He is not ill-appearing or toxic-appearing.   HENT:      Head: Normocephalic.   Eyes:      General: No scleral icterus.        Right eye: No discharge.         Left eye: No discharge.   Pulmonary:      Effort: Pulmonary effort is normal. No respiratory distress.   Skin:     General: Skin is warm and dry.      Coloration: Skin is not jaundiced or pale.   Neurological:      Mental Status: He is alert.   Psychiatric:         Mood and Affect: Mood normal.              ASSESSMENT   Today we discussed the assessment and management of the following diagnosis:    ICD-10-CM ICD-9-CM   1. Elevated liver transaminase level  R74.01 790.4   2. Noninfective gastroenteritis and colitis  K52.9 558.9       PLAN      1. Elevated liver transaminase level  -Concern for elevated liver transaminase level s/p cholecysectomy d/t ulcerative colitis, severe vitamin D deficiency or other pathology.  -Will obtain CT  abdomen for evaluation  -Will order further labwork, Vit D, mag, phos, coags, CMP, and hepatitis panel to be drawn during iron infusion  -No mesalamine dose change indicated  2. Noninfective gastroenteritis and colitis  -See above  -Ordered iron infusion given iron deficiency anemia from UC  - CT abdomen without and with contrast; Future  - CT abdomen without and with contrast; Future     3. Sickle Cell Disease:  -Pain well controlled, continue current pain medication regimen    4. Headaches:  -Sound similar to migraines; hold tylenol and ibuprofen given H&H and liver function  -Try sumatriptan for headache relief  -Recommended keeping a headache journal to see if cause is evident      Orders Placed This Encounter    CT abdomen without and with contrast    CT abdomen without and with contrast    SUMAtriptan (IMITREX) 50 MG tablet       Patient Instructions   Headaches:  -Likely migraine or strss  -Keep a headache journal: document how long you slept the night before, foods you ate, pain rating, what the headache feels like, how long it lasts and what made it go away  -Do not take tylenol right now  -If tylenol is working let us know we'll try a migraine medicine  -If lasting more than a month let us know  -If worst headache of life, loss of vision, weakness, thunderclap headache, call and go to ED  -Do not take Alleeve for headache  -Trial imitrex if tylenol doesn't work.    Talking to pharmacy about mesalamine  Nursing will call you to schedule infusion    Labs for infusion:  Vit D, coags, phos, mag, cmp, hepatitis panel           Time: I personally spent 30 minutes on the calendar day of the encounter, including pre and post visit work.    Follow up post infusion and CT results    Phylis Bougie, NP

## 2019-12-18 NOTE — Telephone Encounter (Signed)
Writer canceled and rescheduled patients appointment due to being unable to reach patient. Patient scheduled to complete CT scan on 12/22/2019 at 10:30 AM. Writer left patient VM requesting to receive a return call to confirm appointment date, time, and prep. Writer will follow up with MyChart confirmation.

## 2019-12-18 NOTE — Telephone Encounter (Signed)
MyChart message sent to patient confirming appointment information.

## 2019-12-18 NOTE — Progress Notes (Addendum)
Unable to draw labs at infusion center

## 2019-12-22 ENCOUNTER — Ambulatory Visit: Payer: Medicaid Other

## 2019-12-23 ENCOUNTER — Other Ambulatory Visit: Payer: Self-pay

## 2019-12-23 NOTE — Progress Notes (Signed)
Person CM attempted to contact: CM attempted to contact Amadeus on his cell phone with several phone calls throughout the month.     Reason: CM planned to review upcoming appointments for any barriers to care, review crisis plan and comprehensive assessment, and assess for any immediate care management needs.    Result: No contact was made on 3/31. CM left a voice mail explaining reason for call.    Follow Up Plan: CM will attempt to contact Ajai again by 12/31/2019.

## 2019-12-25 ENCOUNTER — Ambulatory Visit: Payer: Medicaid Other

## 2019-12-26 ENCOUNTER — Other Ambulatory Visit: Payer: Self-pay | Admitting: Primary Care

## 2019-12-26 ENCOUNTER — Ambulatory Visit: Payer: Medicaid Other

## 2019-12-26 ENCOUNTER — Other Ambulatory Visit: Payer: Self-pay

## 2019-12-26 DIAGNOSIS — D571 Sickle-cell disease without crisis: Secondary | ICD-10-CM

## 2019-12-26 MED FILL — Folic Acid Tab 1 MG: ORAL | 30 days supply | Qty: 30 | Fill #5 | Status: AC

## 2019-12-28 ENCOUNTER — Encounter: Payer: Self-pay | Admitting: Primary Care

## 2019-12-28 NOTE — Telephone Encounter (Signed)
Patient Name: Cameron [name]   Birth Date: August 05, 1996   Address: [address] [place], [place] [address]   Sex: Male   Rx Written Rx Dispensed Drug Quantity Days Supply Prescriber Name Payment Method Dispenser   11/27/2019 11/27/2019 oxycodone hcl 10 mg tablet  150 25 [name], Tiffany L M Other [facility]      Patient Name: Cameron [name]   Birth Date: 04/03/1996   Address: [address] [place], [place] [address]   Sex: Male   Rx Written Rx Dispensed Drug Quantity Days Supply Prescriber Name Payment Method Dispenser   11/02/2019 11/02/2019 oxycodone hcl 10 mg tablet  150 25 [name], [name] [facility]   09/29/2019 09/29/2019 oxycodone hcl 10 mg tablet  150 [address] Barkley Bruns M Other [facility]   08/26/2019 08/28/2019 oxycodone hcl 10 mg tablet  150 25 [name], [name] [facility]   08/04/2019 08/04/2019 oxycodone hcl 10 mg tablet  150 25 [name], [name] [facility]   07/29/2019 07/29/2019 oxycodone hcl 10 mg tablet  42 7 [place], Barkley Bruns M Other [facility]   06/29/2019 06/29/2019 oxycodone hcl 10 mg tablet  150 [address], Barkley Bruns M Other [facility]   05/29/2019 05/29/2019 oxycodone hcl 10 mg tablet  150 25 [name], [name] [facility]   04/28/2019 04/28/2019 oxycodone hcl 10 mg tablet  150 [address], Barkley Bruns M Other [facility]   03/31/2019 03/31/2019 oxycodone hcl 10 mg tablet  150 25 [name] Insurance [facility]   02/27/2019 02/27/2019 oxycodone hcl 10 mg tablet  150 25 [name], [name] MD Insurance [facility]   02/18/2019 02/19/2019 oxycodone hcl 10 mg tablet  30 5 [name], [name] MD Insurance [facility]   02/13/2019 02/13/2019 oxycodone hcl 10 mg tablet  30 5 [name], [name] MD Insurance [facility]   01/26/2019 01/26/2019 oxycodone hcl 10 mg tablet  84 14 [name], [name] MD Insurance [facility]   01/09/2019 01/10/2019 oxycodone hcl 10 mg tablet  84 14 [name], [name] Insurance [facility]

## 2019-12-29 ENCOUNTER — Encounter: Payer: Self-pay | Admitting: Primary Care

## 2019-12-29 ENCOUNTER — Other Ambulatory Visit: Payer: Self-pay

## 2019-12-29 MED ORDER — OXYCODONE HCL 10 MG PO TABS *I*
10.0000 mg | ORAL_TABLET | ORAL | 0 refills | Status: DC | PRN
Start: 2019-12-29 — End: 2020-01-11
  Filled 2019-12-29: qty 70, 12d supply, fill #0

## 2019-12-29 NOTE — Telephone Encounter (Signed)
Error

## 2019-12-29 NOTE — Telephone Encounter (Signed)
I need Cameron [name] to complete the CT abdomen ordered by Cameron [name] and schedule his iron infusion as well as complete a urine drug screen (last completed a year ago) and blood work  I have refilled a 2 week supply for him and would ask he call us back when he has those appointments made and urine testing done for resumption of monthly refills   I am concerned about missing something we need to address by only managing the pain

## 2019-12-29 NOTE — Telephone Encounter (Signed)
Patient Name: Cameron [name]   Birth Date: 08/12/1996   Address: [address] [place], [place] [address]   Sex: Male   Rx Written Rx Dispensed Drug Quantity Days Supply Prescriber Name Payment Method Dispenser   11/27/2019 11/27/2019 oxycodone hcl 10 mg tablet  150 25 [name], Tiffany L M Other [name]      Patient Name: [name]   Birth Date: 11/13/1995   Address: [address] [place], [place] [address]   Sex: Male   Rx Written Rx Dispensed Drug Quantity Days Supply Prescriber Name Payment Method Dispenser   11/02/2019 11/02/2019 oxycodone hcl 10 mg tablet  150 25 [name], [name] Other [name]   09/29/2019 09/29/2019 oxycodone hcl 10 mg tablet  150 25 Reinhardt, Kristine M Other [name]   08/26/2019 08/28/2019 oxycodone hcl 10 mg tablet  150 25 [name], [name] Other [name]   08/04/2019 08/04/2019 oxycodone hcl 10 mg tablet  150 25 [name], [name] [name]   07/29/2019 07/29/2019 oxycodone hcl 10 mg tablet  42 7 Reinhardt, Kristine M [name]   06/29/2019 06/29/2019 oxycodone hcl 10 mg tablet  150 25 Reinhardt, Kristine M [name]   05/29/2019 05/29/2019 oxycodone hcl 10 mg tablet  150 25 [name], [name] [name]   04/28/2019 04/28/2019 oxycodone hcl 10 mg tablet  150 25 Reinhardt, Kristine M [name]   03/31/2019 03/31/2019 oxycodone hcl 10 mg tablet  150 25 Reinhardt, Kristine M Insurance [name]   02/27/2019 02/27/2019 oxycodone hcl 10 mg tablet  150 25 [name], [name] Insurance [name]   02/18/2019 02/19/2019 oxycodone hcl 10 mg tablet  30 5 [name], [name] Insurance [name]   02/13/2019 02/13/2019 oxycodone hcl 10 mg tablet  30 5 [name], [name] Insurance [facility]   01/26/2019 01/26/2019 oxycodone hcl 10 mg tablet  84 14 [name], [name] Insurance [name]   01/09/2019 01/10/2019 oxycodone hcl 10 mg tablet  84 14 [name], [name] Insurance [name]

## 2019-12-30 NOTE — Telephone Encounter (Signed)
Writer spoke with patient to relay information regarding CT abdomen, iron infusion, urine and blood work getting done. Patient confirmed understanding and will call back once this is done.

## 2019-12-31 ENCOUNTER — Telehealth: Payer: Self-pay | Admitting: Primary Care

## 2019-12-31 NOTE — Telephone Encounter (Signed)
I would really like Cameron [name] to schedule his iron infusion and GI follow up - I provided him information to schedule both at our last visit - happy to see him but those are the active issues to attend to

## 2019-12-31 NOTE — Telephone Encounter (Signed)
[facility] TELEPHONE TRIAGE     Reason for call: Mom called stating that their family is going out of town soon and would like for patient to be seen for follow up before trip. Mom states she wants to make sure everyone is okay health wise before trip.    Writer scheduled patient for video visit on 4/15.     Name of caller:Tracy for Cameron [name]  Relationship to patient: mother  Phone:  [phone]

## 2020-01-01 NOTE — Progress Notes (Signed)
Failed visit

## 2020-01-01 NOTE — Telephone Encounter (Signed)
Called and left message for pt  Requested a call back to the office   Office number provided   Dorian Pod, [place]  01/01/2020  1:34 PM

## 2020-01-04 ENCOUNTER — Other Ambulatory Visit: Payer: Self-pay

## 2020-01-04 NOTE — Progress Notes (Signed)
Assessment Utilized: On 4/12 CM and Cameron [name] completed a review of his crisis management plan over the phone.    Names/Roles of participants: CM and Cameron [name] were the only participants in in completing the plan.    Description of Client participation and understanding: Cameron [name] fully participated with CM, provided honest answers, and verbalized understanding of purpose of plan.     Status of completion of assessment: The entire assessment was finalized on 01/04/2020.     Summary of Key Crises identified: Key crisis identified at this time include significant sickle cell pain crises. Does not report any other form of crisis otherwise.     Summary of preventative actions CM can support: CM can support Cameron [name] by calling or texting him to check in for immediate needs, provider appointment reminders, and assess for barriers to care after missed appointments as needed.      Summary of Support resources to contact if client is in crisis: Support resources Cameron [name] can contact in the event of a crisis include his girlfriend, his mother, PCP, and CM. Cameron [name] verbalized understanding of calling 911 in case of an absolute emergency as well.     Status of provision of copy of the Plan to Client and Care Team: CM will make a copy available to Cameron [name] and care team upon request. CM will make a reasonable attempt to get it signed at a later date.

## 2020-01-05 ENCOUNTER — Ambulatory Visit
Admission: RE | Admit: 2020-01-05 | Discharge: 2020-01-05 | Disposition: A | Discharge status: Home / Self Care | Payer: Medicaid Other | Source: Ambulatory Visit

## 2020-01-05 ENCOUNTER — Other Ambulatory Visit: Payer: Self-pay | Admitting: Oncology

## 2020-01-05 ENCOUNTER — Telehealth: Payer: Self-pay | Admitting: Oncology

## 2020-01-05 ENCOUNTER — Ambulatory Visit
Admission: RE | Admit: 2020-01-05 | Discharge: 2020-01-05 | Disposition: A | Discharge status: Home / Self Care | Payer: Medicaid Other | Source: Ambulatory Visit | Attending: Oncology | Admitting: Oncology

## 2020-01-05 DIAGNOSIS — R161 Splenomegaly, not elsewhere classified: Secondary | ICD-10-CM

## 2020-01-05 DIAGNOSIS — R590 Localized enlarged lymph nodes: Secondary | ICD-10-CM

## 2020-01-05 DIAGNOSIS — K529 Noninfective gastroenteritis and colitis, unspecified: Secondary | ICD-10-CM

## 2020-01-05 DIAGNOSIS — K838 Other specified diseases of biliary tract: Secondary | ICD-10-CM | POA: Insufficient documentation

## 2020-01-05 DIAGNOSIS — K589 Irritable bowel syndrome without diarrhea: Secondary | ICD-10-CM | POA: Insufficient documentation

## 2020-01-05 MED ORDER — IOHEXOL 350 MG/ML (OMNIPAQUE) IV SOLN *I*
1.0000 mL | Freq: Once | INTRAVENOUS | Status: AC
Start: 2020-01-05 — End: 2020-01-05
  Administered 2020-01-05: 131 mL via INTRAVENOUS

## 2020-01-05 MED ORDER — STERILE WATER FOR IRRIGATION IR SOLN *I*
900.0000 mL | Freq: Once | Status: AC
Start: 2020-01-05 — End: 2020-01-05
  Administered 2020-01-05: 900 mL via ORAL

## 2020-01-05 NOTE — Discharge Instructions (Signed)
Discharge Instructions for Patients receiving   Contrast Medium    01/05/2020  9:48 AM    INFORMATION  I.V. contrast is eliminated through the kidneys.  Oral and rectal contrasts are not absorbed by the body and will be eliminated through the gastrointestinal tract.  Side effects and allergic reactions to contrast mediums are not common but can occur up to 48 hours after your scan.    INSTRUCTIONS   1. You will need to drink four 8 oz glasses of fluid over the next four hours, unless your medical condition does not allow you to do this.  Please speak with an [place] if you have any questions or concerns about this.  2. Mild headache may occur following contrast injection.  3. The kidneys excrete the contrast.  Your urine will NOT become discolored.  4. You may experience loose stools/diarrhea for approximately 24 hours after drinking oral contrast.  5. You may experience watery stool immediately after rectal contrast.  6.  If you are a DIABETIC and take Actos Plus Met, Actos Plus Met XR, Avandamet, Foramet, Glucophage, Glucophage XR, Glucovance, Glumetza, Janumet, Janumet XR, Jentadueto, [place], Kombiglyze XR, [place], [place], [place], [place], Xigduo XR or Metformin check with your doctor for management of your diabetes during this time.  Your doctor will have to address the need to hold this medication or the need for kidney function blood tests.   7.  There is no evidence that the contrast you have received would be harmful to your breastfed child.  If you choose, you may pump and discard your breast milk for the next 24 hours.    Delayed effects from contrast are not common.  However, notify your doctor IMMEDIATELY:    If nausea or vomiting occurs   If any itching, hives, wheezing, or rashes occur   Severe or throbbing headache    {RIS DI PHONE NUMBERS:[id number]}

## 2020-01-05 NOTE — Telephone Encounter (Signed)
Added pelvis to abd CT given colitis

## 2020-01-07 ENCOUNTER — Telehealth: Payer: Self-pay | Admitting: Student in an Organized Health Care Education/Training Program

## 2020-01-07 ENCOUNTER — Other Ambulatory Visit: Payer: Self-pay

## 2020-01-07 ENCOUNTER — Ambulatory Visit: Payer: Medicaid Other | Admitting: Primary Care

## 2020-01-07 ENCOUNTER — Telehealth: Payer: Self-pay | Admitting: Primary Care

## 2020-01-07 DIAGNOSIS — D571 Sickle-cell disease without crisis: Secondary | ICD-10-CM

## 2020-01-07 DIAGNOSIS — D564 Hereditary persistence of fetal hemoglobin [HPFH]: Secondary | ICD-10-CM

## 2020-01-07 DIAGNOSIS — K8301 Primary sclerosing cholangitis: Secondary | ICD-10-CM

## 2020-01-07 DIAGNOSIS — G43909 Migraine, unspecified, not intractable, without status migrainosus: Secondary | ICD-10-CM

## 2020-01-07 DIAGNOSIS — K5190 Ulcerative colitis, unspecified, without complications: Secondary | ICD-10-CM

## 2020-01-07 NOTE — Telephone Encounter (Signed)
Patient needs another appontment for iron infusions at [facility] on Monday/Friday/Saturday can you call to book?

## 2020-01-07 NOTE — Telephone Encounter (Signed)
Copied from CRM [id number]. Topic: Appointments - Appointment Information  >> Jan 07, 2020  3:48 PM Tona Sensing T wrote:  Dr.[name] is calling to schedule FUV with Dr.Lee for patient. Writer unable to schedule. Dr.[name] can be reached at [phone] if doctor needs to be reached. She will be sending Dr.Lee a message. Patient can be reached at [phone]     Please schedule appointment and put the date on my chart. Patient will take any date.    Writer unable to schedule

## 2020-01-07 NOTE — Progress Notes (Signed)
[facility] COMPLAINT     Chief Complaint   Patient presents with    Abnormal Lab       Subjective     TELEMEDICINE CONSENT     Visit being conducted by Video in lieu of a face to face visit to minimize health care worker and patient exposure to COVID-19, and conserve PPE.    Location of Patient: home  Location of Telemedicine Provider: hospital / clinical location  Other Participants in telemedicine encounter and roles: none    Consent was obtained from the patient to complete this telemedicine visit; including the potential for financial liability.  How did the patient provide consent? Verbal Consent Only    SUBJECTIVE     Abnormal LFT:  Completed recent CT scan and noted feeling a bit "funny" the day after but that has self resolved - no rash, no swelling, stomach just felt off  Has not had any increase in stool output, no blood stool, no change in color  No abdominal pain, good appetite and energy level  No itching  Taking asacol 3 tab BID consistently    SCD:  Intermittent recurrent back pain  Taking oxy IR about 5-6 times per week and ibuprofen with good pain control   Avoiding acetaminophen due to LFT abnormalities    Headaches:  imitrex for abortive therapy has been working well for him      MEDICATIONS     Current Outpatient Medications   Medication Sig    oxyCODONE (ROXICODONE) 10 MG immediate release tablet Take 1 tablet (10 mg total) by mouth every 4 hours as needed for Pain Max daily dose: 6 tablets    SUMAtriptan (IMITREX) 50 MG tablet Take 1 tablet (50 mg total) by mouth as needed for Migraine Take at onset of headache. May repeat once in 2 hours.    folic acid (FOLVITE) 1 MG tablet TAKE 1 TABLET (1 MG TOTAL) BY MOUTH DAILY    mesalamine (ASACOL HD) 800 MG tablet TAKE 3 TABLETS (2,400 MG TOTAL) BY MOUTH 2 TIMES DAILY    diclofenac (VOLTAREN) 1 % gel Apply 4 g to affected area 4 times daily.    ferrous sulfate 325 (65 FE) MG tablet Take 1 tablet  (325 mg total) by mouth 2 times daily (with meals)    acetaminophen (TYLENOL) 500 mg tablet Take 500 mg by mouth every 6 hours as needed    naloxone (NARCAN) 4 mg/0.1 mL nasal spray Instill 1 spray in 1 nostril once for opioid reversal. Repeat in alternating nostrils with new package every 2-3 minutes until response     Medications reviewed at today's visit       Objective     OBJECTIVE   PHYSICAL EXAM  This visit was performed during a pandemic event, and the physical exam was limited to my Video observation of this patient's organ systems and/or body areas.   GEN:  Well appearing young man       ASSESSMENT   Today we discussed the assessment and management of the following diagnosis:    ICD-10-CM ICD-9-CM   1. 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 Sickle cell disease with HPFH  D57.1 282.68    D56.4    4. Migraine, unspecified, not intractable, without status migrainosus  G43.909 346.90       PLAN      1. Ulcerative colitis  Continue Asacol at current dosing bowel sx stable     2. Primary sclerosing cholangitis  Highly concerned about this new secondary diagnosis with evidence of hepatic and biliary inflammation on imaging and labs in the setting of co diagnosis of UC.  Needs follow up with Dr. Nedra Hai of gastroenterology at [facility] -     3. Sickle cell disease with HPFH  Continue current management  Would benefit from iron replacement     4. Migraine, unspecified, not intractable, without status migrainosus  Doing well using imitrex for abortive therapy, continue current management for rare migraine               Belvin Gauss Dutch Gray, MD

## 2020-01-08 ENCOUNTER — Other Ambulatory Visit: Payer: Self-pay | Admitting: Primary Care

## 2020-01-08 ENCOUNTER — Telehealth: Payer: Self-pay | Admitting: Primary Care

## 2020-01-08 DIAGNOSIS — K5190 Ulcerative colitis, unspecified, without complications: Secondary | ICD-10-CM

## 2020-01-08 DIAGNOSIS — K8301 Primary sclerosing cholangitis: Secondary | ICD-10-CM

## 2020-01-08 NOTE — Telephone Encounter (Signed)
Called infusion center. Attempted to set up appt. Was told the order needs to be approved before they can set up an appointment.   Should be receiving a phone call back from infusion today, the latest being Monday.

## 2020-01-08 NOTE — Telephone Encounter (Signed)
Writer spoke to [employer], Systems developer at [employer]. Lupita Leash states the [facility] imaging departments have updated coding to schedule this exam. Lupita Leash relayed to Clinical research associate a sooner appointment can be scheduled to receive requested exam by placing order as " MRI abdomen with and without contrast & MRCP with 3D render".   Order pended for provider review. Writer will call to schedule once order is reviewed.

## 2020-01-08 NOTE — Telephone Encounter (Signed)
Please let patient know I communicated with his gastroenterologist who is going to work to facilitate an apppointment but he needs to have another imaging test of his abdomen in the meantime  I have ordered an MRCP that needs to be completed ASAP

## 2020-01-08 NOTE — Telephone Encounter (Signed)
Apologies - the days listed were not meant to be consequetive they are just the days of the week the patient is available - no need to do infusions that close together

## 2020-01-08 NOTE — Telephone Encounter (Signed)
Called infusion, they didn't see the order placed.   Also said the dates were too close together and they would need to speak to pharmacy before the appointments are scheduled.

## 2020-01-08 NOTE — Telephone Encounter (Signed)
Order completed and signed thank you

## 2020-01-11 ENCOUNTER — Other Ambulatory Visit
Admission: RE | Admit: 2020-01-11 | Discharge: 2020-01-11 | Disposition: A | Discharge status: Home / Self Care | Payer: Medicaid Other | Source: Ambulatory Visit | Attending: Primary Care | Admitting: Primary Care

## 2020-01-11 ENCOUNTER — Other Ambulatory Visit: Payer: Self-pay

## 2020-01-11 ENCOUNTER — Other Ambulatory Visit: Payer: Self-pay | Admitting: Primary Care

## 2020-01-11 ENCOUNTER — Other Ambulatory Visit
Admission: RE | Admit: 2020-01-11 | Discharge: 2020-01-11 | Disposition: A | Discharge status: Home / Self Care | Payer: Medicaid Other | Source: Ambulatory Visit

## 2020-01-11 ENCOUNTER — Encounter: Payer: Self-pay | Admitting: Internal Medicine

## 2020-01-11 DIAGNOSIS — D571 Sickle-cell disease without crisis: Secondary | ICD-10-CM

## 2020-01-11 DIAGNOSIS — K5190 Ulcerative colitis, unspecified, without complications: Secondary | ICD-10-CM | POA: Insufficient documentation

## 2020-01-11 LAB — COMPREHENSIVE METABOLIC PANEL
ALT: 173 U/L — ABNORMAL HIGH (ref 0–50)
AST: 108 U/L — ABNORMAL HIGH (ref 0–50)
Albumin: 3.7 g/dL (ref 3.5–5.2)
Alk Phos: 1317 U/L — ABNORMAL HIGH (ref 40–130)
Anion Gap: 9 (ref 7–16)
Bilirubin,Total: 1.8 mg/dL — ABNORMAL HIGH (ref 0.0–1.2)
CO2: 24 mmol/L (ref 20–28)
Calcium: 9 mg/dL — ABNORMAL LOW (ref 9.3–10.5)
Chloride: 103 mmol/L (ref 96–108)
Creatinine: 0.78 mg/dL (ref 0.67–1.17)
GFR,Black: 148 *
GFR,Caucasian: 128 *
Glucose: 101 mg/dL — ABNORMAL HIGH (ref 60–99)
Lab: 6 mg/dL (ref 6–20)
Potassium: 4.1 mmol/L (ref 3.3–5.1)
Sodium: 136 mmol/L (ref 133–145)
Total Protein: 8.4 g/dL — ABNORMAL HIGH (ref 6.3–7.7)

## 2020-01-11 LAB — FERRITIN: Ferritin: 12 ng/mL — ABNORMAL LOW (ref 20–250)

## 2020-01-11 LAB — PAIN CLINIC PROFILE
Amphetamine,UR: NEGATIVE
Benzodiazepinen,UR: NEGATIVE
Cocaine/Metab,UR: NEGATIVE
Opiates,UR: NEGATIVE
Oxycodone/Oxymorphone,UR: POSITIVE
THC Metabolite,UR: POSITIVE

## 2020-01-11 LAB — CBC AND DIFFERENTIAL
Baso # K/uL: 0.1 10*3/uL (ref 0.0–0.1)
Basophil %: 0.9 %
Eos # K/uL: 0.6 10*3/uL — ABNORMAL HIGH (ref 0.0–0.5)
Eosinophil %: 7.1 %
Hematocrit: 24 % — ABNORMAL LOW (ref 40–51)
Hemoglobin: 6.9 g/dL — ABNORMAL LOW (ref 13.7–17.5)
IMM Granulocytes #: 0 10*3/uL (ref 0.0–0.0)
IMM Granulocytes: 0.4 %
Lymph # K/uL: 1.5 10*3/uL (ref 1.3–3.6)
Lymphocyte %: 19.1 %
MCH: 17 pg — ABNORMAL LOW (ref 26–32)
MCHC: 28 g/dL — ABNORMAL LOW (ref 32–37)
MCV: 60 fL — ABNORMAL LOW (ref 79–92)
Mono # K/uL: 0.5 10*3/uL (ref 0.3–0.8)
Monocyte %: 6.2 %
Neut # K/uL: 5.2 10*3/uL (ref 1.8–5.4)
Nucl RBC # K/uL: 0 10*3/uL (ref 0.0–0.0)
Nucl RBC %: 0 /100 WBC (ref 0.0–0.2)
Platelets: 468 10*3/uL — ABNORMAL HIGH (ref 150–330)
RBC: 4 MIL/uL — ABNORMAL LOW (ref 4.6–6.1)
RDW: 20.8 % — ABNORMAL HIGH (ref 11.6–14.4)
Seg Neut %: 66.3 %
WBC: 7.9 10*3/uL (ref 4.2–9.1)

## 2020-01-11 LAB — VITAMIN B12: Vitamin B12: 661 pg/mL (ref 232–1245)

## 2020-01-11 LAB — TIBC
Iron: 26 ug/dL — ABNORMAL LOW (ref 45–170)
TIBC: 419 ug/dL (ref 250–450)
Transferrin Saturation: 6 % — ABNORMAL LOW (ref 20–55)

## 2020-01-11 LAB — VITAMIN D: 25-OH Vit Total: <5 ng/mL — ABNORMAL LOW (ref 30–60)

## 2020-01-11 NOTE — Addendum Note (Signed)
Addended by: Homero Fellers A on: 01/11/2020 03:20 PM     Modules accepted: Orders

## 2020-01-11 NOTE — Telephone Encounter (Signed)
Search Terms: Pranish Akhavan, 14-Aug-1996   Search Date: 01/11/2020 10:18:55 AM   Searching on behalf of: [id number] - Karle Barr   The Drug Utilization Report below displays all of the controlled substance prescriptions, if any, that your patient has filled in the last twelve months. The information displayed on this report is compiled from pharmacy submissions to the Department, and accurately reflects the information as submitted by the pharmacies.  This report was requested by: [name]   Reference #: [id number]   Others' Prescriptions  Patient Name: Cameron [name]   Birth Date: 05-27-1996   Address: [address] [place], [place] [address]   Sex: Male   Rx Written Rx Dispensed Drug Quantity Days Supply Prescriber Name Payment Method Dispenser   11/02/2019 11/02/2019 oxycodone hcl 10 mg tablet  150 25 [name], [name] Other [facility]   09/29/2019 09/29/2019 oxycodone hcl 10 mg tablet  150 [address] Barkley Bruns M Other [facility]   08/26/2019 08/28/2019 oxycodone hcl 10 mg tablet  150 25 [name], [name] Other [facility]   08/04/2019 08/04/2019 oxycodone hcl 10 mg tablet  150 25 [name], [name] Other [facility]   07/29/2019 07/29/2019 oxycodone hcl 10 mg tablet  42 7 [place], Barkley Bruns M Other [facility]   06/29/2019 06/29/2019 oxycodone hcl 10 mg tablet  150 [address], Barkley Bruns M Other [facility]   05/29/2019 05/29/2019 oxycodone hcl 10 mg tablet  150 25 [name], [name] Other [facility]   04/28/2019 04/28/2019 oxycodone hcl 10 mg tablet  150 [address], Barkley Bruns M Other [facility]   03/31/2019 03/31/2019 oxycodone hcl 10 mg tablet  150 25 [name] Insurance [facility]   02/27/2019 02/27/2019 oxycodone hcl 10 mg tablet  150 25 [name], [name] MD Insurance [facility]    02/18/2019 02/19/2019 oxycodone hcl 10 mg tablet  30 5 [name], [name] MD Insurance [facility]   02/13/2019 02/13/2019 oxycodone hcl 10 mg tablet  30 5 [name], [name] MD Insurance [facility]   01/26/2019 01/26/2019 oxycodone hcl 10 mg tablet  84 14 [name], [name] MD Insurance [facility]      Patient Name: Kyran Connaughton   Birth Date: 07-26-1996   Address: [address] [place], [place] [address]   Sex: Male   Rx Written Rx Dispensed Drug Quantity Days Supply Prescriber Name Payment Method Dispenser   12/29/2019 12/29/2019 oxycodone hcl 10 mg tablet  70 12 [name], Tiffany L M Other [facility]   11/27/2019 11/27/2019 oxycodone hcl 10 mg tablet  150 25 [name], Tiffany L M Other [facility]

## 2020-01-11 NOTE — Telephone Encounter (Signed)
Called infusion center. Set up appt for Friday the 23rd at 8am. Will call pt to confirm date of appt.

## 2020-01-12 ENCOUNTER — Other Ambulatory Visit: Payer: Self-pay

## 2020-01-12 ENCOUNTER — Other Ambulatory Visit: Payer: Self-pay | Admitting: Oncology

## 2020-01-12 ENCOUNTER — Telehealth: Payer: Self-pay | Admitting: Oncology

## 2020-01-12 ENCOUNTER — Encounter: Payer: Self-pay | Admitting: Oncology

## 2020-01-12 DIAGNOSIS — Z5321 Procedure and treatment not carried out due to patient leaving prior to being seen by health care provider: Secondary | ICD-10-CM

## 2020-01-12 MED ORDER — OXYCODONE HCL 10 MG PO TABS *I*
10.0000 mg | ORAL_TABLET | ORAL | 0 refills | Status: DC | PRN
Start: 2020-01-12 — End: 2020-01-25
  Filled 2020-01-12: qty 70, 12d supply, fill #0

## 2020-01-12 NOTE — Telephone Encounter (Signed)
IV Iron orders placed.  Infusion center is unable to draw labs.

## 2020-01-12 NOTE — Telephone Encounter (Signed)
This encounter was created in error - please disregard.  This encounter was created in error - please disregard.

## 2020-01-12 NOTE — Telephone Encounter (Signed)
Patient scheduled to complete requested imaging on 04/27. Patient sent MyChart reminder.

## 2020-01-13 ENCOUNTER — Telehealth: Payer: Self-pay

## 2020-01-13 LAB — THC SEMI-QUANT, URINE
Creatinine,UR: 178 mg/dL (ref 20–300)
Semi-Quant THC,UR: 697 ng/mL
THC/Creat Ratio: 392 ng/mg

## 2020-01-13 LAB — CONFIRM OPIATES: Confirm Opiates: POSITIVE

## 2020-01-13 LAB — CONFIRM THC METABOLITE, URINE: Confirm THC Metab: POSITIVE

## 2020-01-13 NOTE — Telephone Encounter (Signed)
Called patient. Scheduled fuv with dr. Nedra Hai for 6/8 at 10am on ac5. Patient confirmed. Advised patient of appt location.

## 2020-01-13 NOTE — Telephone Encounter (Signed)
-----   Message from Cherlynn Perches Salvatore Marvel, MD sent at 01/12/2020  4:00 PM EDT -----  Then it will need to be on June 8th. Ok to [employer]. Thank you!  ----- Message -----  From: Anthoney Harada  Sent: 01/12/2020   3:36 PM EDT  To: Lavenia Atlas, MD    Hello. I attempted to schedule the patient for 5/11, but the patient said he will be out of town until 5/18. Please advise on a different clinic date. Thanks   ----- Message -----  From: Lavenia Atlas, MD  Sent: 01/12/2020   3:02 PM EDT  To: [facility]    Afternoon team,    Would you please schedule a follow up in my clinic on 5/11 please? Thank you!

## 2020-01-14 ENCOUNTER — Other Ambulatory Visit: Payer: Self-pay | Admitting: Primary Care

## 2020-01-14 ENCOUNTER — Encounter: Payer: Self-pay | Admitting: Primary Care

## 2020-01-14 ENCOUNTER — Ambulatory Visit
Admission: AD | Admit: 2020-01-14 | Discharge: 2020-01-14 | Disposition: A | Discharge status: Home / Self Care | Payer: Medicaid Other | Source: Ambulatory Visit | Attending: Primary Care | Admitting: Primary Care

## 2020-01-14 ENCOUNTER — Other Ambulatory Visit: Payer: Self-pay

## 2020-01-14 DIAGNOSIS — Z20822 Contact with and (suspected) exposure to covid-19: Secondary | ICD-10-CM

## 2020-01-14 DIAGNOSIS — Z20828 Contact with and (suspected) exposure to other viral communicable diseases: Secondary | ICD-10-CM | POA: Insufficient documentation

## 2020-01-14 MED ORDER — ERGOCALCIFEROL 50000 UNIT PO CAPS *I*
50000.0000 [IU] | ORAL_CAPSULE | ORAL | 0 refills | Status: AC
Start: 2020-01-14 — End: 2020-07-12
  Filled 2020-01-14: qty 4, 28d supply, fill #0

## 2020-01-14 NOTE — Telephone Encounter (Signed)
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 if he needs a note for work  Will contact the [facility] if he becomes symptomatic  Clinical research associate will make MD aware and check on pt tomorrow

## 2020-01-14 NOTE — ED Triage Notes (Signed)
Patient presenting to Urgent Care for testing only. COVID-19 PCR test ordered by outside provider     Does the patient currently have symptoms concerning for COVID-19?: No     What is the reason for testing?: household exposure      Nasal swab obtained and sent for analysis.    Patient was offered a medical evaluation with an Urgent Care provider and is declined.         Triage Note   Marcello Fennel, RN

## 2020-01-14 NOTE — Telephone Encounter (Signed)
[facility] TELEPHONE TRIAGE     Reason for call: Patient called regarding this. He states that he was exposed to his brother [name], who tested positive. [name] states that he is not experiencing any symptoms and would like guidance on what to do.    Name of caller: Cameron [name]  Relationship to patient: self  Phone:  [phone]

## 2020-01-15 ENCOUNTER — Ambulatory Visit: Payer: Medicaid Other

## 2020-01-15 ENCOUNTER — Other Ambulatory Visit: Payer: Self-pay

## 2020-01-15 LAB — COVID-19 PCR

## 2020-01-15 LAB — COVID-19 NAAT (PCR): COVID-19 NAAT (PCR): NEGATIVE

## 2020-01-15 NOTE — Addendum Note (Signed)
Addended by: Homero Fellers A on: 01/15/2020 12:38 PM     Modules accepted: Orders

## 2020-01-15 NOTE — Progress Notes (Signed)
Plan for 3 iron infusions  After the third recheck iron studies  Based on lab value may need up to 2 more infusions for a total of 5 iron infusions

## 2020-01-19 ENCOUNTER — Ambulatory Visit: Payer: Medicaid Other

## 2020-01-25 ENCOUNTER — Other Ambulatory Visit: Payer: Self-pay

## 2020-01-25 ENCOUNTER — Other Ambulatory Visit: Payer: Self-pay | Admitting: Oncology

## 2020-01-25 ENCOUNTER — Ambulatory Visit: Payer: Medicaid Other | Attending: Oncology

## 2020-01-25 ENCOUNTER — Other Ambulatory Visit: Payer: Self-pay | Admitting: Internal Medicine

## 2020-01-25 VITALS — BP 120/54 | HR 81 | Temp 97.9°F

## 2020-01-25 DIAGNOSIS — K5180 Other ulcerative colitis without complications: Secondary | ICD-10-CM

## 2020-01-25 DIAGNOSIS — D508 Other iron deficiency anemias: Secondary | ICD-10-CM | POA: Insufficient documentation

## 2020-01-25 DIAGNOSIS — D571 Sickle-cell disease without crisis: Secondary | ICD-10-CM

## 2020-01-25 DIAGNOSIS — K5100 Ulcerative (chronic) pancolitis without complications: Secondary | ICD-10-CM | POA: Insufficient documentation

## 2020-01-25 DIAGNOSIS — E611 Iron deficiency: Secondary | ICD-10-CM

## 2020-01-25 DIAGNOSIS — D564 Hereditary persistence of fetal hemoglobin [HPFH]: Secondary | ICD-10-CM

## 2020-01-25 MED ORDER — SODIUM CHLORIDE 0.9 % IV SOLN WRAPPED *I*
100.0000 mg | Freq: Once | Status: AC
Start: 2020-01-25 — End: 2020-01-25
  Administered 2020-01-25: 100 mg via INTRAVENOUS
  Filled 2020-01-25 (×2): qty 5

## 2020-01-25 NOTE — Progress Notes (Signed)
D1C1 of venofer. PIV placed in right forearm with great flush and blood return noted pre and post infusion. Tolerated treatment with 30 minute wait time after. Appointment scheduled for 1 week. Denied AVS. No concerns at time.    COVID precautions were followed throughout visit. Patient complied with masking policy throughout duration of appointment. Writer maintained use of mask throughout duration of appointment. Writer was within 6 feet of patient for >15 minutes due to the nature of the treatment.

## 2020-01-25 NOTE — Telephone Encounter (Signed)
Patient Name: Cameron [name]   Birth Date: 01-22-1996   Address: [address] [place], [place] [address]   Sex: Male   Rx Written Rx Dispensed Drug Quantity Days Supply Prescriber Name Payment Method Dispenser   01/12/2020 01/12/2020 oxycodone hcl 10 mg tablet  70 12 Homero Fellers, A, [place] [facility]   12/29/2019 12/29/2019 oxycodone hcl 10 mg tablet  70 12 [name], Tiffany L M Other [facility]   11/27/2019 11/27/2019 oxycodone hcl 10 mg tablet  150 25 [name], Tiffany L M Other [facility]      Patient Name: Cameron [name]   Birth Date: 1996-01-20   Address: [address] [place], [place] [address]   Sex: Male   Rx Written Rx Dispensed Drug Quantity Days Supply Prescriber Name Payment Method Dispenser   11/02/2019 11/02/2019 oxycodone hcl 10 mg tablet  150 25 [name], [name] Other [facility]   09/29/2019 09/29/2019 oxycodone hcl 10 mg tablet  150 [address] Barkley Bruns M Other [facility]   08/26/2019 08/28/2019 oxycodone hcl 10 mg tablet  150 25 [name], [name] Other [facility]   08/04/2019 08/04/2019 oxycodone hcl 10 mg tablet  150 25 [name], [name] Other [facility]   07/29/2019 07/29/2019 oxycodone hcl 10 mg tablet  42 7 [place], Barkley Bruns M Other [facility]   06/29/2019 06/29/2019 oxycodone hcl 10 mg tablet  150 [address] Barkley Bruns M Other [facility]   05/29/2019 05/29/2019 oxycodone hcl 10 mg tablet  150 25 [name], [name] Other [facility]   04/28/2019 04/28/2019 oxycodone hcl 10 mg tablet  150 [address], Barkley Bruns M Other [facility]   03/31/2019 03/31/2019 oxycodone hcl 10 mg tablet  150 25 [name] Insurance [facility]   02/27/2019 02/27/2019 oxycodone hcl 10 mg tablet  150 25 [name], [name] MD Insurance [facility]   02/18/2019 02/19/2019 oxycodone hcl 10 mg tablet  30 5 [name], [name] MD Insurance [facility]   02/13/2019 02/13/2019 oxycodone hcl 10 mg tablet  30 5 [name], [name] MD Insurance [facility]   01/26/2019 01/26/2019 oxycodone hcl 10 mg tablet  84 14 [name], [name] Insurance [facility]

## 2020-01-26 ENCOUNTER — Encounter: Payer: Self-pay | Admitting: Internal Medicine

## 2020-01-26 ENCOUNTER — Telehealth: Payer: Self-pay | Admitting: Primary Care

## 2020-01-26 ENCOUNTER — Other Ambulatory Visit: Payer: Self-pay

## 2020-01-26 MED ORDER — OXYCODONE HCL 10 MG PO TABS *I*
10.0000 mg | ORAL_TABLET | ORAL | 0 refills | Status: DC | PRN
Start: 2020-01-26 — End: 2020-02-02
  Filled 2020-01-26: qty 70, 12d supply, fill #0

## 2020-01-26 MED ORDER — MESALAMINE 800 MG PO TBEC *I*
DELAYED_RELEASE_TABLET | ORAL | 2 refills | Status: DC
Start: 2020-01-26 — End: 2020-08-02
  Filled 2020-01-26: qty 180, 30d supply, fill #0
  Filled 2020-04-25: qty 180, 30d supply, fill #1
  Filled 2020-06-08: qty 180, 30d supply, fill #2

## 2020-01-26 NOTE — Telephone Encounter (Signed)
[facility] TELEPHONE TRIAGE     Reason for call: Patient requested to speak to supervisor or manager. Patent requested to speak to someone regarding switching providers. Patient states he is frustrated with only receiving two week prescriptions for pain medication at a time when he has been unable or unavailable to complete follow ups. Patient states this has been disruptive to the patients care. Patient was on phone with patients mother. Both patient and patients mother requested to receive a return call from a supervisor.     Name of caller: Cameron [name]  Phone: [phone]

## 2020-01-27 ENCOUNTER — Other Ambulatory Visit: Payer: Self-pay

## 2020-01-27 ENCOUNTER — Other Ambulatory Visit: Payer: Self-pay | Admitting: Primary Care

## 2020-01-27 DIAGNOSIS — D571 Sickle-cell disease without crisis: Secondary | ICD-10-CM

## 2020-01-27 NOTE — Progress Notes (Signed)
On 5/4 CM consulted with PCP Tiffany [name] and [facility] nursing staff to discuss care management needs in preparation for Himmat upcoming vacation. CM initiated a brief case conference with his treatment providers to offer assistance with helping prepare any social needs that Burrell may have presented to his providers during recent conversations. PCP confirmed that Berman needs are medical and related to having enough medications for his trip. PCP also confirmed that Khamani did not have any social or health homes needs requiring CM assistance at this time. CM confirmed the estimated length of stay out of town to be approximately two weeks. Matvey has not presented any barriers to care or needs to CM at this time. CM confirmed he has CMs direct cell number for any calls or texts needing immediate attention. CM will remain available to providers and Zariah as needed.

## 2020-01-27 NOTE — Telephone Encounter (Signed)
Patient Name: Cameron [name]   Birth Date: 1996-06-18   Address: [address] [place], [place] [address]   Sex: Male   Rx Written Rx Dispensed Drug Quantity Days Supply Prescriber Name Payment Method Dispenser   11/02/2019 11/02/2019 oxycodone hcl 10 mg tablet  150 25 [name], [name] Other [facility]   09/29/2019 09/29/2019 oxycodone hcl 10 mg tablet  150 [address], Barkley Bruns M Other [facility]   08/26/2019 08/28/2019 oxycodone hcl 10 mg tablet  150 25 [name], [name] Other [facility]   08/04/2019 08/04/2019 oxycodone hcl 10 mg tablet  150 25 [name], [name] Other [facility]   07/29/2019 07/29/2019 oxycodone hcl 10 mg tablet  42 7 [place], Barkley Bruns M Other [facility]   06/29/2019 06/29/2019 oxycodone hcl 10 mg tablet  150 [address] Barkley Bruns M Other [facility]   05/29/2019 05/29/2019 oxycodone hcl 10 mg tablet  150 25 [name], [name] Other [facility]   04/28/2019 04/28/2019 oxycodone hcl 10 mg tablet  150 [address] Barkley Bruns M Other [facility]   03/31/2019 03/31/2019 oxycodone hcl 10 mg tablet  150 25 [name] Insurance [facility]   02/27/2019 02/27/2019 oxycodone hcl 10 mg tablet  150 25 [name], [name] MD Insurance [facility]   02/18/2019 02/19/2019 oxycodone hcl 10 mg tablet  30 5 [name], [name] MD Insurance [facility]   02/13/2019 02/13/2019 oxycodone hcl 10 mg tablet  30 5 [name], [name] MD Insurance [facility]      Patient Name: Cameron [name]   Birth Date: [birth year]   Address: [address] [place], [place] [address]   Sex: Male   Rx Written Rx Dispensed Drug Quantity Days Supply Prescriber Name Payment Method Dispenser   01/26/2020 01/26/2020 oxycodone hcl 10 mg tablet  70 12 [name], Tiffany L M Other [facility]   01/12/2020 01/12/2020 oxycodone hcl 10 mg tablet  70 12 Homero Fellers, A, [place] Other [facility]   12/29/2019 12/29/2019 oxycodone hcl 10 mg tablet  70 12 [name], Tiffany L M Other [facility]   11/27/2019 11/27/2019 oxycodone hcl 10 mg tablet  150 25 [name], Tiffany L M Other [facility]

## 2020-02-02 ENCOUNTER — Encounter: Payer: Self-pay | Admitting: Internal Medicine

## 2020-02-02 ENCOUNTER — Other Ambulatory Visit: Payer: Self-pay | Admitting: Primary Care

## 2020-02-02 ENCOUNTER — Ambulatory Visit
Admission: AD | Admit: 2020-02-02 | Discharge: 2020-02-02 | Disposition: A | Discharge status: Home / Self Care | Payer: Medicaid Other | Source: Ambulatory Visit | Attending: Emergency Medicine | Admitting: Emergency Medicine

## 2020-02-02 ENCOUNTER — Other Ambulatory Visit: Payer: Self-pay

## 2020-02-02 DIAGNOSIS — D571 Sickle-cell disease without crisis: Secondary | ICD-10-CM

## 2020-02-02 DIAGNOSIS — Z202 Contact with and (suspected) exposure to infections with a predominantly sexual mode of transmission: Secondary | ICD-10-CM | POA: Insufficient documentation

## 2020-02-02 DIAGNOSIS — Z113 Encounter for screening for infections with a predominantly sexual mode of transmission: Secondary | ICD-10-CM

## 2020-02-02 MED ORDER — AZITHROMYCIN 250 MG PO TABS *I*
1000.0000 mg | ORAL_TABLET | Freq: Once | ORAL | Status: AC
Start: 2020-02-02 — End: 2020-02-02
  Administered 2020-02-02: 1000 mg via ORAL

## 2020-02-02 MED ORDER — CEFTRIAXONE SODIUM 250 MG IJ SOLR *I*
500.0000 mg | Freq: Once | INTRAMUSCULAR | Status: AC
Start: 2020-02-02 — End: 2020-02-02
  Administered 2020-02-02: 500 mg via INTRAMUSCULAR

## 2020-02-02 NOTE — ED Notes (Signed)
Pt refused to stay to be observed after receiving medication.

## 2020-02-02 NOTE — Discharge Instructions (Signed)
We have given you medication to cover for both chlamydia and gonorrhea.  We have also tested you for both of these diseases.

## 2020-02-02 NOTE — Telephone Encounter (Signed)
Search Terms: Alferd Obryant, 1996/03/05   Search Date: 02/02/2020 16:31:32 PM   Searching on behalf of: [id number] - Karle Barr   The Drug Utilization Report below displays all of the controlled substance prescriptions, if any, that your patient has filled in the last twelve months. The information displayed on this report is compiled from pharmacy submissions to the Department, and accurately reflects the information as submitted by the pharmacies.  This report was requested by: [name]   Reference #: [id number]   Others' Prescriptions  Patient Name: Laban Orourke   Birth Date: [birth year]   Address: [address] [place], [place] [address]   Sex: Male   Rx Written Rx Dispensed Drug Quantity Days Supply Prescriber Name Payment Method Dispenser   11/02/2019 11/02/2019 oxycodone hcl 10 mg tablet  150 25 [name], [name] Other [facility]   09/29/2019 09/29/2019 oxycodone hcl 10 mg tablet  150 [address] Barkley Bruns M Other [facility]   08/26/2019 08/28/2019 oxycodone hcl 10 mg tablet  150 25 [name], [name] Other [facility]   08/04/2019 08/04/2019 oxycodone hcl 10 mg tablet  150 25 [name], [name] Other [facility]   07/29/2019 07/29/2019 oxycodone hcl 10 mg tablet  42 7 [place], Barkley Bruns M Other [facility]   06/29/2019 06/29/2019 oxycodone hcl 10 mg tablet  150 [address] Barkley Bruns M Other [facility]   05/29/2019 05/29/2019 oxycodone hcl 10 mg tablet  150 25 [name], [name] Other [facility]   04/28/2019 04/28/2019 oxycodone hcl 10 mg tablet  150 [address], Barkley Bruns M Other [facility]   03/31/2019 03/31/2019 oxycodone hcl 10 mg tablet  150 25 [name] Insurance [facility]   02/27/2019 02/27/2019 oxycodone hcl 10 mg tablet  150 25 [name], [name] MD Insurance [facility]    02/18/2019 02/19/2019 oxycodone hcl 10 mg tablet  30 5 [name], [name] MD Insurance [facility]   02/13/2019 02/13/2019 oxycodone hcl 10 mg tablet  30 5 [name], [name] MD Insurance [facility]      Patient Name: Duquan Gillooly   Birth Date: Apr 10, 1996   Address: [address] [place], [place] [address]   Sex: Male   Rx Written Rx Dispensed Drug Quantity Days Supply Prescriber Name Payment Method Dispenser   01/26/2020 01/26/2020 oxycodone hcl 10 mg tablet  70 12 [name], Tiffany L M Other [facility]   01/12/2020 01/12/2020 oxycodone hcl 10 mg tablet  70 12 Homero Fellers, A, [place] [facility]   12/29/2019 12/29/2019 oxycodone hcl 10 mg tablet  70 12 [name], Tiffany L M Other [facility]   11/27/2019 11/27/2019 oxycodone hcl 10 mg tablet  150 25 [name], Tiffany L M Other [facility]

## 2020-02-02 NOTE — UC Provider Note (Signed)
History     Chief Complaint   Patient presents with    Sexually Transmitted Diseases     found out on friday that the male he had unprotecteed sex with on thursday tested positive for chlamydia. denies dysuria or abdominal pain.      Here because he states he had unprotected sex with a woman who tested positive for chlamydia.  This is apparently within the last week or so.  Patient denies any symptoms.  He is not having penile discharge or pain.      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            Past Surgical History:   Procedure Laterality Date    CHOLECYSTECTOMY  2014     Family History   Problem Relation Age of Onset    Other Mother     Sickle cell anemia Sister     Sickle cell anemia Brother     Sickle cell anemia Brother           Social History     Tobacco Use    Smoking status: Never Smoker    Smokeless tobacco: Never Used   Substance Use Topics    Alcohol use: No    Drug use: No     Living Situation     Questions Responses    Patient lives with Family    Homeless No    Caregiver for other family member     External Services     Employment     Domestic Violence Risk                 Review of Systems   Review of Systems   Constitutional: Negative for fever.   Genitourinary: Negative for decreased urine volume, discharge, dysuria, frequency, penile pain, penile swelling, scrotal swelling, testicular pain and urgency.   Allergic/Immunologic: Positive for  immunocompromised state.   All other systems reviewed and are negative.      Physical Exam   Triage Vitals  Triage Start: Start, (02/02/20 1230)   First Recorded BP: 134/64, Resp: 16, Temp: 36.2 C (97.2 F), Temp src: TEMPORAL Oxygen Therapy SpO2: 100 %, Oximetry Source: Rt Hand, O2 Device: None (Room air), Heart Rate: 82, (02/02/20 1231)  .      Physical Exam  Vitals and nursing note reviewed.   Constitutional:       General: He is not in acute distress.  HENT:      Nose: Nose normal.   Cardiovascular:      Rate and Rhythm: Normal rate.   Pulmonary:      Effort: Pulmonary effort is normal.   Abdominal:      Palpations: Abdomen is soft.   Neurological:      General: No focal deficit present.      Mental Status: He is alert.  Medical Decision Making        Initial Evaluation:  ED First Provider Contact     Date/Time Event User Comments    02/02/20 1229 ED First Provider Contact Mammie Russian H Initial Face to Face Provider Contact          Patient was seen on: 02/02/2020        Assessment:  23 y.o.male comes [facility] the [facility] with reports an exposure to a woman with chlamydia positive testing.  Asymptomatic.    Differential Diagnosis includes:  Chlamydia  Gonorrhea    Plan: Given the exposure I will presumptively treat for both chlamydia and gonorrhea.  Urine test has been sent for testing for both gonorrhea and chlamydia but treatment has already been given.         Final Diagnosis  Final diagnoses:   [Z20.2] Exposure to chlamydia (Primary)         Alvester Morin, MD              Alvester Morin, MD  02/02/20 [phone]

## 2020-02-03 ENCOUNTER — Encounter: Payer: Self-pay | Admitting: Internal Medicine

## 2020-02-03 ENCOUNTER — Ambulatory Visit: Payer: Medicaid Other

## 2020-02-03 ENCOUNTER — Encounter: Payer: Self-pay | Admitting: Primary Care

## 2020-02-03 ENCOUNTER — Telehealth: Payer: Self-pay

## 2020-02-03 LAB — N. GONORRHOEAE DNA AMPLIFICATION: N. gonorrhoeae DNA Amplification: 0

## 2020-02-03 LAB — CHLAMYDIA PLASMID DNA AMPLIFICATION: Chlamydia Plasmid DNA Amplification: 0

## 2020-02-03 MED FILL — Iron Sucrose Inj 20 MG/ML (Fe Equiv): INTRAVENOUS | Qty: 5 | Status: AC

## 2020-02-03 NOTE — Telephone Encounter (Signed)
Attempted to call patient after no show for appointment.  No answer on provided number.

## 2020-02-04 ENCOUNTER — Other Ambulatory Visit: Payer: Self-pay

## 2020-02-04 MED ORDER — OXYCODONE HCL 10 MG PO TABS *I*
10.0000 mg | ORAL_TABLET | ORAL | 0 refills | Status: DC | PRN
Start: 2020-02-04 — End: 2020-02-24
  Filled 2020-02-04: qty 70, 12d supply, fill #0

## 2020-02-06 ENCOUNTER — Other Ambulatory Visit: Payer: Self-pay

## 2020-02-07 ENCOUNTER — Other Ambulatory Visit: Payer: Self-pay

## 2020-02-08 ENCOUNTER — Other Ambulatory Visit: Payer: Self-pay

## 2020-02-09 ENCOUNTER — Ambulatory Visit: Payer: Medicaid Other | Admitting: Primary Care

## 2020-02-10 ENCOUNTER — Telehealth: Payer: Self-pay

## 2020-02-10 ENCOUNTER — Ambulatory Visit: Payer: Medicaid Other

## 2020-02-10 MED FILL — Iron Sucrose Inj 20 MG/ML (Fe Equiv): INTRAVENOUS | Qty: 5 | Status: AC

## 2020-02-10 NOTE — Telephone Encounter (Signed)
Called pt regarding his 2pm appointment, no answer voicemail message left.

## 2020-02-17 ENCOUNTER — Ambulatory Visit: Payer: Medicaid Other

## 2020-02-17 ENCOUNTER — Telehealth: Payer: Self-pay

## 2020-02-17 MED FILL — Iron Sucrose Inj 20 MG/ML (Fe Equiv): INTRAVENOUS | Qty: 5 | Status: AC

## 2020-02-17 NOTE — Telephone Encounter (Signed)
Called pt about 25 minutes into his appt to see if he was planning on coming today for his iron infusion. He stated he was at work and he was sorry he hasn't been coming, but that he has been working.     Writer asked pt if he wanted to reschedule at this time and he said he would have to see which day he has off this week and call back on his break. Pt stated he had our number.

## 2020-02-24 ENCOUNTER — Other Ambulatory Visit: Payer: Self-pay

## 2020-02-24 ENCOUNTER — Other Ambulatory Visit: Payer: Self-pay | Admitting: Internal Medicine

## 2020-02-24 DIAGNOSIS — D571 Sickle-cell disease without crisis: Secondary | ICD-10-CM

## 2020-02-25 ENCOUNTER — Other Ambulatory Visit: Payer: Self-pay

## 2020-02-25 NOTE — Telephone Encounter (Signed)
Confidential Drug Report  Search Terms: Aadil Sur, 02-17-1996   Search Date: 02/25/2020 08:28:58 AM   Searching on behalf of: [id number] - [name]   The Drug Utilization Report below displays all of the controlled substance prescriptions, if any, that your patient has filled in the last twelve months. The information displayed on this report is compiled from pharmacy submissions to the Department, and accurately reflects the information as submitted by the pharmacies.  This report was requested by: Alvira Philips   Reference #: [id number]   Others' Prescriptions  Patient Name: Cameron [name]   Birth Date: [birth year]   Address: [address] [place], [place] [address]   Sex: Male   Rx Written Rx Dispensed Drug Quantity Days Supply Prescriber Name Prescriber Dea # Payment Method Dispenser   11/02/2019 11/02/2019 oxycodone hcl 10 mg tablet  150 25 PulcinoGwenith Spitz MD [id number] [facility]   09/29/2019 09/29/2019 oxycodone hcl 10 mg tablet  150 25 Jodi Geralds [id number] [facility]   08/26/2019 08/28/2019 oxycodone hcl 10 mg tablet  150 25 PulcinoGwenith Spitz MD [id number] [facility]   08/04/2019 08/04/2019 oxycodone hcl 10 mg tablet  150 25 PulcinoGwenith Spitz MD [id number] [facility]   07/29/2019 07/29/2019 oxycodone hcl 10 mg tablet  42 7 Jodi Geralds [id number] [facility]   06/29/2019 06/29/2019 oxycodone hcl 10 mg tablet  150 25 Jodi Geralds [id number] [facility]   05/29/2019 05/29/2019 oxycodone hcl 10 mg tablet  150 25 PulcinoGwenith Spitz MD [id number] [facility]   04/28/2019 04/28/2019 oxycodone hcl 10 mg tablet  150 25 Jodi Geralds [id number] [facility]   03/31/2019 03/31/2019 oxycodone hcl 10 mg tablet  150 [address] Jodi Geralds [id number] Insurance [facility]   02/27/2019 02/27/2019 oxycodone hcl 10 mg tablet  150 25 Benay Pillow MD [id number] Insurance [facility]      Patient Name: Cameron [name]   Birth Date: [birth year]   Address: [address] [place], [place] [address]   Sex: Male   Rx Written Rx Dispensed Drug Quantity Days Supply Prescriber Name Prescriber Dea # Payment Method Dispenser   02/04/2020 02/08/2020 oxycodone hcl 10 mg tablet  [address] Jodi Geralds [id number] [facility]   01/26/2020 01/26/2020 oxycodone hcl 10 mg tablet  70 12 [place]ice [id number] Other [facility]   01/12/2020 01/12/2020 oxycodone hcl 10 mg tablet  [address] Oliva Bustard [id number] Other [facility]   12/29/2019 12/29/2019 oxycodone hcl 10 mg tablet  70 12 [place] Grice [id number] Other [facility]   11/27/2019 11/27/2019 oxycodone hcl 10 mg tablet  150 25 [place] Grice [id number] Other [facility]   * - Drugs marked with an asterisk are compound drugs. If the compound drug is made up of more than one controlled substance, then each controlled substance will be a separate row in the table.   2021 [facility]

## 2020-02-26 ENCOUNTER — Encounter: Payer: Self-pay | Admitting: Internal Medicine

## 2020-02-27 ENCOUNTER — Encounter: Payer: Self-pay | Admitting: Internal Medicine

## 2020-02-27 ENCOUNTER — Other Ambulatory Visit: Payer: Self-pay

## 2020-02-27 MED ORDER — OXYCODONE HCL 10 MG PO TABS *I*
10.0000 mg | ORAL_TABLET | ORAL | 0 refills | Status: DC | PRN
Start: 2020-02-27 — End: 2020-03-08
  Filled 2020-02-27: qty 70, 12d supply, fill #0

## 2020-03-01 ENCOUNTER — Ambulatory Visit: Payer: Medicaid Other | Admitting: Student in an Organized Health Care Education/Training Program

## 2020-03-01 ENCOUNTER — Telehealth: Payer: Self-pay | Admitting: Primary Care

## 2020-03-01 ENCOUNTER — Encounter: Payer: Self-pay | Admitting: Internal Medicine

## 2020-03-01 NOTE — Telephone Encounter (Signed)
I am very very concerned about Cameron [name] - he has evidence of now liver disease related to his UC and was scheduled for imaging to clarify (MRI of abdomen) and a visit with GI and did not attend either   Routing to team nurse and CM to engage with patient in coordinated way and ensure rescheduling and attendance at those f/u visits

## 2020-03-02 ENCOUNTER — Telehealth: Payer: Self-pay

## 2020-03-02 NOTE — Telephone Encounter (Signed)
Called and he's at work right now. Tentative phone call with me tomorrow at 9 am. Will update with the outcome.

## 2020-03-02 NOTE — Telephone Encounter (Signed)
Patient was a NO SHOW for a follow up visit on 6.8.21 with Dr. Nedra Hai. Letter sent to patient.

## 2020-03-02 NOTE — Telephone Encounter (Signed)
Called and left message for pt  Requested a call back to the office   Office number provided   Dorian Pod, [place]  03/02/2020  10:43 AM

## 2020-03-04 NOTE — Telephone Encounter (Signed)
Attempted to call Thursday and Friday. Will try calling again next week, will also send significant information in a text.

## 2020-03-08 ENCOUNTER — Ambulatory Visit: Payer: Medicaid Other | Admitting: Internal Medicine

## 2020-03-08 ENCOUNTER — Other Ambulatory Visit: Payer: Self-pay

## 2020-03-08 DIAGNOSIS — M545 Low back pain, unspecified: Secondary | ICD-10-CM

## 2020-03-08 DIAGNOSIS — R748 Abnormal levels of other serum enzymes: Secondary | ICD-10-CM

## 2020-03-08 DIAGNOSIS — D571 Sickle-cell disease without crisis: Secondary | ICD-10-CM

## 2020-03-08 DIAGNOSIS — K5100 Ulcerative (chronic) pancolitis without complications: Secondary | ICD-10-CM

## 2020-03-08 DIAGNOSIS — G8929 Other chronic pain: Secondary | ICD-10-CM

## 2020-03-08 MED ORDER — OXYCODONE HCL 10 MG PO TABS *I*
10.0000 mg | ORAL_TABLET | ORAL | 0 refills | Status: DC | PRN
Start: 2020-03-08 — End: 2020-03-28
  Filled 2020-03-08: qty 70, 12d supply, fill #0

## 2020-03-08 MED ORDER — CYCLOBENZAPRINE HCL 5 MG PO TABS *I*
5.0000 mg | ORAL_TABLET | Freq: Three times a day (TID) | ORAL | 0 refills | Status: DC | PRN
Start: 2020-03-08 — End: 2020-09-20
  Filled 2020-03-08: qty 60, 20d supply, fill #0

## 2020-03-08 NOTE — Patient Instructions (Addendum)
One time visit with PT to focus on lower back pain/stretches   Flexeril 5mg  three times a day as needed for back pain/muscle tightness    Elevated Liver enzymes: You NEED to follow up with the GI doctors   Call today to reschedule!!-  Phone: [phone]  Care management is going to follow up in 1 week to remind you of this   -MRI will also be scheduled- we will notify you of when this gets ordered    -you have follow up with Dr. [phone] scheduled for 04/19/20 at 3pm- this is inperson and is really important that you make it to this appointment!

## 2020-03-08 NOTE — Progress Notes (Signed)
[facility]    TELEMEDICINE VISIT  CHIEF COMPLAINT     Chief Complaint   Patient presents with    Follow-up       Subjective     TELEMEDICINE CONSENT     Visit being conducted by Video in lieu of a face to face visit to minimize health care worker and patient exposure to COVID-19, and conserve PPE.    Location of Patient: home  Location of Telemedicine Provider: hospital / clinical location  Other Participants in telemedicine encounter and roles: none     Consent was obtained from the patient to complete this telemedicine visit; including the potential for financial liability.  How did the patient provide consent? Verbal Consent Only    SUBJECTIVE     Sickle cell disease:  "i'm alright"   Back pain: lower back pain- pain feels like sickle cell pain  Denies feeling like muscle soreness- middle back- not along his spine but along the sides of his spine- Has tried muscle relaxer in the past- this has helped. 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     Pain medication:  Oxycodone IR 10mg  Q4hrs PRN- typically he takes it on average about 3x/day  Drinking a lot of fluids- good po intake   "for some reason my sickle cell doesn't bother me too much"  Denies fever, SOB, chest pain, etc.     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 Max daily dose: 6 tablets    mesalamine (ASACOL HD) 800 MG tablet TAKE 3 TABLETS (2,400 MG TOTAL) BY MOUTH 2 TIMES DAILY    ergocalciferol (ERGOCALCIFEROL) 50000 UNIT capsule Take 1 capsule (50,000 units total) by mouth once a week    SUMAtriptan (IMITREX) 50 MG tablet Take 1 tablet (50 mg total) by mouth as needed for Migraine Take at onset of headache. May repeat once in 2 hours.    folic acid (FOLVITE) 1 MG tablet TAKE 1 TABLET (1 MG TOTAL) BY MOUTH DAILY    diclofenac (VOLTAREN) 1 % gel Apply 4 g to affected area 4 times daily.    ferrous sulfate 325 (65 FE) MG  tablet Take 1 tablet (325 mg total) by mouth 2 times daily (with meals)    acetaminophen (TYLENOL) 500 mg tablet Take 500 mg by mouth every 6 hours as needed    naloxone (NARCAN) 4 mg/0.1 mL nasal spray Instill 1 spray in 1 nostril once for opioid reversal. Repeat in alternating nostrils with new package every 2-3 minutes until response     Medications reviewed at today's visit       Objective     OBJECTIVE   PHYSICAL EXAM  This visit was performed during a pandemic event, and the physical exam was limited to my Video observation of this patient's organ systems and/or body areas.       Physical Exam  Constitutional:       General: He is not in acute distress.     Appearance: He is not diaphoretic.      Comments: Pleasant, cooperative, conversant. Smiling and appears comfortable over video.    HENT:      Head: Normocephalic and atraumatic.   Eyes:      General: No  scleral icterus.  Pulmonary:      Effort: Pulmonary effort is normal. No respiratory distress.   Musculoskeletal:         General: Normal range of motion.      Cervical back: Normal range of motion and neck supple.   Skin:     General: Skin is dry.   Neurological:      Mental Status: He is alert and oriented to person, place, and time.   Psychiatric:         Mood and Affect: Mood and affect normal.              ASSESSMENT   Today we discussed the assessment and management of the following diagnosis:    ICD-10-CM ICD-9-CM   1. Sickle cell disease  D57.1 282.60   2. Chronic low back pain without sciatica, unspecified back pain laterality  M54.5 724.2    G89.29 338.29   3. Ulcerative pancolitis without complication  K51.00 556.6   4. Elevated liver enzymes  R74.8 790.5       PLAN      1. Sickle cell disease  Overall stable  -Continue oxycodone 10mg  every 4 hours as needed  -Continue other disease modifying medications as prescribed  -Encouraged good p.o. 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 Max daily dose: 6 tablets  Dispense: 70 tablet; Refill: 0  - [facility]B RE[facility]ERRAL TO [facility]    2. Chronic low back pain without sciatica, unspecified back pain laterality  Added Flexeril 5 mg 3 times daily as needed  Encouraged stretching lower thoracic-patient is willing to meet with physical therapist at least once to review some exercises referral sent  AMB REFERRAL TO [facility]    3. Ulcerative pancolitis without complication  Stable  Continue disease modifying medications as prescribed    4. Elevated liver enzymes/concern for primary sclerosing  cholangitis  Discussed PCP and team's concern for follow-up regarding follow-up with GI provider and MRI of abdomen.   -Provided patient with GI doctors phone number, will have care management/nursing reach out in  1 week to follow-up with patient regarding this appointment.   -Discussed patient's barriers that he works a lot and makes it difficult to go to appointments-patient states that he does not work on Mondays or Tuesdays so these are good days for him to get appointment scheduled.   -[facility] to schedule MRI as soon as possible as orders are already in place and waiting to be obtained  -Reiterated the importance of patient to follow-up regarding these issues and the potential risks if not appropriately diagnosed         Orders Placed This Encounter    AMB REFERRAL TO [facility]    cyclobenzaprine (FLEXERIL) 5 MG tablet    oxyCODONE (ROXICODONE) 10 MG immediate release tablet       Patient Instructions   One time visit with PT to focus on lower back pain/stretches   Flexeril 5mg  three times a day as needed for back pain/muscle tightness    Elevated Liver enzymes: You NEED to follow up with the GI doctors   Call today to reschedule!!-  Phone: [phone]  Care management is going to follow up in 1 week to remind you of this   -MRI will also be scheduled- we will notify you of when this gets ordered    -you have follow up with Dr. Wilhelmenia Blase scheduled for 04/19/20 at 3pm- this is inperson and is really important that you make it to this appointment!          No follow-ups on file.      Barbaraann Boys, NP

## 2020-03-09 ENCOUNTER — Other Ambulatory Visit: Payer: Self-pay | Admitting: Internal Medicine

## 2020-03-09 ENCOUNTER — Other Ambulatory Visit: Payer: Self-pay

## 2020-03-09 DIAGNOSIS — K5190 Ulcerative colitis, unspecified, without complications: Secondary | ICD-10-CM

## 2020-03-09 DIAGNOSIS — R748 Abnormal levels of other serum enzymes: Secondary | ICD-10-CM

## 2020-03-09 NOTE — Progress Notes (Signed)
I spoke to Rajha, she will keep a look out for the scheduled GI appointment and she will reach out to [facility] with reminder calls/text.

## 2020-03-10 ENCOUNTER — Other Ambulatory Visit: Payer: Self-pay

## 2020-03-11 ENCOUNTER — Encounter: Payer: Self-pay | Admitting: Internal Medicine

## 2020-03-14 DIAGNOSIS — D5700 Hb-SS disease with crisis, unspecified: Secondary | ICD-10-CM

## 2020-03-14 DIAGNOSIS — M545 Low back pain: Secondary | ICD-10-CM

## 2020-03-14 NOTE — ED Triage Notes (Signed)
Low back pain for 3 days. No mechanism of injury. Hx sickle cell disease. Has not taken anything at home for the pain. Ambulatory.        Triage Note   Marga Hoots, RN

## 2020-03-15 ENCOUNTER — Encounter: Payer: Self-pay | Admitting: Emergency Medicine

## 2020-03-15 ENCOUNTER — Emergency Department
Admission: EM | Admit: 2020-03-15 | Discharge: 2020-03-15 | Disposition: A | Discharge status: Home / Self Care | Payer: Medicaid Other | Source: Ambulatory Visit | Attending: Emergency Medicine | Admitting: Emergency Medicine

## 2020-03-15 ENCOUNTER — Telehealth: Payer: Self-pay | Admitting: Primary Care

## 2020-03-15 DIAGNOSIS — D5700 Hb-SS disease with crisis, unspecified: Secondary | ICD-10-CM

## 2020-03-15 LAB — HOLD GREEN WITH GEL

## 2020-03-15 LAB — BASIC METABOLIC PANEL
Anion Gap: 12 (ref 7–16)
CO2: 22 mmol/L (ref 20–28)
Calcium: 9.2 mg/dL — ABNORMAL LOW (ref 9.3–10.5)
Chloride: 101 mmol/L (ref 96–108)
Creatinine: 0.75 mg/dL (ref 0.67–1.17)
GFR,Black: 150 *
GFR,Caucasian: 130 *
Glucose: 90 mg/dL (ref 60–99)
Lab: 7 mg/dL (ref 6–20)
Potassium: 4.5 mmol/L (ref 3.3–5.1)
Sodium: 135 mmol/L (ref 133–145)

## 2020-03-15 LAB — CBC AND DIFFERENTIAL
Baso # K/uL: 0.1 10*3/uL (ref 0.0–0.1)
Basophil %: 0.7 %
Eos # K/uL: 0.5 10*3/uL (ref 0.0–0.5)
Eosinophil %: 5.7 %
Hematocrit: 25 % — ABNORMAL LOW (ref 40–51)
Hemoglobin: 7.2 g/dL — ABNORMAL LOW (ref 13.7–17.5)
IMM Granulocytes #: 0 10*3/uL (ref 0.0–0.0)
IMM Granulocytes: 0.1 %
Lymph # K/uL: 2.2 10*3/uL (ref 1.3–3.6)
Lymphocyte %: 25.8 %
MCH: 18 pg — ABNORMAL LOW (ref 26–32)
MCHC: 29 g/dL — ABNORMAL LOW (ref 32–37)
MCV: 64 fL — ABNORMAL LOW (ref 79–92)
Mono # K/uL: 0.6 10*3/uL (ref 0.3–0.8)
Monocyte %: 7.3 %
Neut # K/uL: 5.2 10*3/uL (ref 1.8–5.4)
Nucl RBC # K/uL: 0 10*3/uL (ref 0.0–0.0)
Nucl RBC %: 0 /100 WBC (ref 0.0–0.2)
Platelets: 377 10*3/uL — ABNORMAL HIGH (ref 150–330)
RBC: 4 MIL/uL — ABNORMAL LOW (ref 4.6–6.1)
RDW: 20.9 % — ABNORMAL HIGH (ref 11.6–14.4)
Seg Neut %: 60.4 %
WBC: 8.7 10*3/uL (ref 4.2–9.1)

## 2020-03-15 LAB — RBC MORPHOLOGY

## 2020-03-15 LAB — HOLD LAVENDER

## 2020-03-15 LAB — RETICULOCYTES
Retic %: 2.4 % — ABNORMAL HIGH (ref 0.7–2.3)
Retic Abs: 94.5 10*3/uL (ref 33.8–124.0)

## 2020-03-15 LAB — HOLD SST

## 2020-03-15 MED ORDER — MORPHINE SULFATE 4 MG/ML IV SOLN *WRAPPED*
8.0000 mg | Freq: Once | INTRAVENOUS | Status: AC
Start: 2020-03-15 — End: 2020-03-15
  Administered 2020-03-15: 8 mg via INTRAVENOUS
  Filled 2020-03-15: qty 2

## 2020-03-15 MED ORDER — KETOROLAC TROMETHAMINE 30 MG/ML IJ SOLN *I*
15.0000 mg | Freq: Once | INTRAMUSCULAR | Status: DC
Start: 2020-03-15 — End: 2020-03-15
  Filled 2020-03-15: qty 1

## 2020-03-15 NOTE — Telephone Encounter (Signed)
Spoke to [employer].  Reported that he's been feeling since he's been home from ED.  Pain back to baseline.  Was nervous because he was having numbness yesterday (as in previous triage).    Attempted to book fuv with PCP or Barkley Bruns, NP.  D/t work schedule was unable to make any of appt's this week.  Booked with Marchelle Folks, NP on Monday 6/28 to check in.  Wanted to come in-person.

## 2020-03-15 NOTE — ED Notes (Addendum)
03/15/20 0046   Expected Call-In Information   ED Service [facility] Adult Call-in   PCP/Service Referral 0045 CALL FROM DR. COYNE FROM [facility]   Call received from [facility] Transfer Center? No   Pt Info note/Reason for sending PATIENT HAS CALLED DR. Beaulah Corin TO REPORT EXTREMITY NUMBNESS SINCE TRIAGE.   Call reported to DR. OSTROVSKY, DR. 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

## 2020-03-15 NOTE — Telephone Encounter (Signed)
Received a call from Cameron [name]' phone as he was in the [facility] ED.   I tried to call multiple times, but there was no answer.  The answering service was able to connect me eventually to his cell phone.    He stated that he initially called to see if we could have him roomed sooner, thankfully he was roomed already. He says that he has been having severe pain, noting that he doesn't usually have crises. He also says that he feels a sensation of numbness all over -- arms, legs. I encouraged him to discuss this with the ED providers.    I also reached out to the communication nurse to share this information with them.     The ED provider later paged me and shared that Cameron [name] was feeling better after pain medications. Their neurologic exam was unremarkable thankfully. They were planning on sending him home and wanted to ensure we were on board/communicated with.     Will send to [facility] RN pool to check in on patient and schedule clinical follow up for his crisis.    [name], MD

## 2020-03-15 NOTE — ED Provider Notes (Signed)
History     Chief Complaint   Patient presents with    Back Pain     This is a 24 y.o. male presenting to the ER with back pain.  Patient states that the pain started about 3 days ago.  He has been having chronic low back pain for some time and has been unclear if this is associated with his sickle cell pain crises.  There is a note from just a few weeks ago where he was seen for chronic low back pain.  This is similar but just feels more severe to him.  He has had no chest pain no shortness of breath.  No fevers or chills.  He has had no leg weakness.  He started saying that he was having some numbness and tingling in the bilateral upper extremities after he got here and he feels generally weak but nothing focal.          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            Past Surgical History:   Procedure Laterality Date    CHOLECYSTECTOMY  2014     Family History   Problem Relation Age of Onset    Other Mother     Sickle cell anemia Sister     Sickle cell anemia Brother     Sickle cell anemia Brother           Social History     Tobacco Use    Smoking status: Never Smoker    Smokeless tobacco: Never Used   Substance Use Topics    Alcohol use: No    Drug use: No     Living Situation     Questions Responses    Patient lives with Family    Homeless No    Caregiver for other family member     External Services     Employment     Domestic Violence Risk                 Review of Systems   Review of Systems    Constitutional: Negative for chills and fever.   HENT: Negative for congestion, rhinorrhea and sore throat.    Eyes: Negative for visual disturbance.   Respiratory: Negative for cough and shortness of breath.    Cardiovascular: Negative for chest pain.   Gastrointestinal: Negative for abdominal pain, nausea and vomiting.   Genitourinary: Negative for dysuria, frequency and urgency.   Musculoskeletal: Positive for back pain.   Skin: Negative for rash.   Neurological: Negative for dizziness, seizures and light-headedness.   Hematological: Negative for adenopathy.   Psychiatric/Behavioral: Negative for confusion.       Physical Exam     Triage Vitals  Triage Start: Start, (03/14/20 2242)   First Recorded BP: 122/60, Resp: 18, Temp: 36.9 C (98.4 F), Temp src: TEMPORAL Oxygen Therapy SpO2: 99 %, Oximetry Source: Rt Hand,  Heart Rate: 89, (03/14/20 2235)  .  First Pain Reported  0-10 Scale: 8, (03/14/20 2235)       Physical Exam  Vitals and nursing note reviewed.   Constitutional:       General: He is not in acute distress.     Appearance: He is well-developed.   HENT:      Head: Normocephalic and atraumatic.      Mouth/Throat:      Mouth: Mucous membranes are moist.   Eyes:      General: No scleral icterus.        Right eye: No discharge.         Left eye: No discharge.      Conjunctiva/sclera: Conjunctivae normal.   Neck:      Vascular: No JVD.   Cardiovascular:      Rate and Rhythm: Normal rate and regular rhythm.      Heart sounds: Normal heart sounds. No murmur heard.     Pulmonary:      Effort: Pulmonary effort is normal. No respiratory distress.      Breath sounds: Normal breath sounds. No wheezing or rales.   Chest:      Chest wall: No tenderness.   Abdominal:      General: Bowel sounds are normal. There is no distension.      Palpations: Abdomen is soft.      Tenderness: There is no abdominal tenderness.   Musculoskeletal:      Cervical back: Normal range of motion and neck supple.      Comments: Bilateral  paraspinal tenderness in the lumbar spine.  He is also tender midline but no more so than he is bilaterally.  He has good range of motion.  He is able to stand from sitting.  He is able to ambulate.  Distal extremities are warm and well perfused.   Skin:     General: Skin is warm and dry.      Coloration: Skin is not pale.      Findings: No erythema or rash.   Neurological:      General: No focal deficit present.      Mental Status: He is alert and oriented to person, place, and time.      Cranial Nerves: No cranial nerve deficit.      Sensory: No sensory deficit.      Motor: No weakness.      Coordination: Coordination normal.      Gait: Gait normal.      Deep Tendon Reflexes: Reflexes normal.      Comments: Patient reports subjective numbness and tingling in the hands.  However he has normal sensation in all dermatomes.  He has good grip strength.  He is able to use his arms to push himself up and lower himself down into a chair.  He is also able to stand on his toes and stand on his heels and ambulate.  He is limited more by discomfort than he is actual weakness or loss of sensation.   Psychiatric:         Behavior: Behavior normal.         Medical Decision Making   Patient seen by me on:  03/15/2020    Assessment:  Patient here with low back pain that may be worsening sickle cell pain crisis.  He does not have any findings that would concern me for an acute cord compression or cauda equina syndrome.  His presentation is also not consistent  with a spinal stroke.  I think we will check his labs as it has been several weeks and make sure he is not having any sort of aplastic crisis or severe anemia.  We will try to treat his pain with stronger pain control medicines and check reticulocytes.    Differential diagnosis:  Sickle cell pain crisis  Aplastic crisis  Anemia    Plan:  CBC, BMP, Retics  IV morphine, IV toradol    Independent review of: Existing labs    ED Course and Disposition:  Pain improved. Labs at  baseline. Patient safe to go home and follow up outpatient.        ED Course as of Mar 15 249   Tue Mar 15, 2020   0245 Reports that pain is gone.       0246 Retic %(!): 2.4   0246 Hemoglobin(!): 7.2   0246 Hematocrit(!): 25   0246 Platelets(!): 377   0246 Potassium: 4.5   0246 Creatinine: 0.75       Forde Dandy, MD          Shanica Castellanos, Wynona Meals, MD  03/15/20 [phone]

## 2020-03-15 NOTE — Discharge Instructions (Signed)
You were seen in [facility] today, 03/15/2020. Please follow the below instructions and remember to read through the attached instructions as well. If you have questions you can always call us back at [phone] or call your primary doctor's office.    PLEASE remember to bring this discharge paperwork with you to your doctor's appointment as it may contain information which can help your doctor understand what was done here today.    You were seen for sickle cell pain.     Your labs look okay and your pain got better.     Continue to take Oxycodone as prescribed for pain not controlled by other medicines. You can take one tab and if no improvement in 15-20 minutes, you may take another. Please do not drive while taking this medicine. This medicine may also cause constipation or nausea.     You can also take Medicines like Ibuprofen and Tylenol as pain adjuncts.     Take Ibuprofen and Tylenol as specified below:  1. Ibuprofen 600mg  every 6-8 hours as needed for pain  2. Tylenol 1000mg  every 6-8 hours as needed for pain  For Example:  You can take a dose of Ibuprofen at 8am. You can then take a dose of Tylenol at 11am. At 2pm you are okay for another dose of Ibuprofen.   Do not exceed 4000mg  of Tylenol or 3200 mg of Ibuprofen in a single day. Also realize that many non-prescription and prescription pain medicines may already contain these medicines.    Follow up with your primary doctor.     If your pain worsens, you develop a fever, or yellowing of the eyes, return to the ED.

## 2020-03-16 ENCOUNTER — Encounter: Payer: Self-pay | Admitting: Internal Medicine

## 2020-03-16 ENCOUNTER — Telehealth: Payer: Self-pay | Admitting: Internal Medicine

## 2020-03-16 DIAGNOSIS — G8929 Other chronic pain: Secondary | ICD-10-CM

## 2020-03-16 DIAGNOSIS — M545 Low back pain, unspecified: Secondary | ICD-10-CM

## 2020-03-16 NOTE — Telephone Encounter (Signed)
Please follow up with Cameron [name] to ask about rescheduling his GI appointment and assure he is set for his MRI that is scheduled for 7/3.  Thanks   Jodi Geralds, NP

## 2020-03-17 NOTE — Telephone Encounter (Signed)
Please see encounter from 6/23 RE: GI scheduling

## 2020-03-17 NOTE — Telephone Encounter (Signed)
MRI is 7/3 for [facility] evaluation

## 2020-03-18 ENCOUNTER — Encounter: Payer: Self-pay | Admitting: Gastroenterology

## 2020-03-19 NOTE — Telephone Encounter (Signed)
I have ordered the xrays - patient is scheduled for visit at 9 am on Monday - he should not leave the office without an appointment booked in gastroenterology.  If unable to book with [facility] GI please call [facility]

## 2020-03-21 ENCOUNTER — Ambulatory Visit: Payer: Medicaid Other | Admitting: Oncology

## 2020-03-21 ENCOUNTER — Encounter: Payer: Self-pay | Admitting: Oncology

## 2020-03-21 VITALS — BP 112/64 | HR 82 | Temp 98.0°F | Ht 74.0 in | Wt 182.0 lb

## 2020-03-21 DIAGNOSIS — M549 Dorsalgia, unspecified: Secondary | ICD-10-CM

## 2020-03-21 DIAGNOSIS — D564 Hereditary persistence of fetal hemoglobin [HPFH]: Secondary | ICD-10-CM

## 2020-03-21 DIAGNOSIS — K8301 Primary sclerosing cholangitis: Secondary | ICD-10-CM

## 2020-03-21 DIAGNOSIS — D571 Sickle-cell disease without crisis: Secondary | ICD-10-CM

## 2020-03-21 NOTE — Progress Notes (Signed)
[facility]  [address]  [place] [address]  Phone: [phone]  Fax: [phone]     REASON FOR VISIT      Chief Complaint   Patient presents with    Back Pain    Medication Management         PRIMARY DIAGNOSIS      Sickle cell disease    Subjective     SUBJECTIVE      Cameron [name] was unaccompanied for this visit.    ED Follow up:  -No acute issues today  -Pain 3/10 - baseline  -Back pain is much better  -No bowel/bladder problems  -NO CP, SOB, fever  -No numbness/tingling  -No leg pain      Elevated liver enzymes:  -NO abdominal pain  -No jaundice  -No itching  -Stools are normal in color  -Aware of MRI appointment- has reminder and alarm set in phone  -Aware of need for GI appointment        Review of Systems   Constitutional: Negative for chills, fever and malaise/fatigue.   Respiratory: Negative for sputum production, shortness of breath and wheezing.    Cardiovascular: Negative for chest pain, palpitations and leg swelling.   Gastrointestinal: Negative for abdominal pain, blood in stool, constipation, diarrhea, nausea and vomiting.   Genitourinary: Negative.    Musculoskeletal: Positive for back pain (basleine). Myalgias:  baseline.   Neurological: Negative for dizziness and headaches.     Review of Systems as per HPI above    Past Medical History, Social History, Family History, and Medications/allergies reviewed during this visit    Current Outpatient Medications   Medication    mesalamine (ASACOL HD) 800 MG tablet    ergocalciferol (ERGOCALCIFEROL) 50000 UNIT capsule    folic acid (FOLVITE) 1 MG tablet    diclofenac (VOLTAREN) 1 % gel    ferrous sulfate 325 (65 FE) MG tablet    cyclobenzaprine (FLEXERIL) 5 MG tablet    oxyCODONE (ROXICODONE) 10 MG immediate release tablet    SUMAtriptan (IMITREX) 50 MG tablet    acetaminophen (TYLENOL) 500 mg tablet    naloxone (NARCAN) 4 mg/0.1 mL nasal spray     No current facility-administered medications for this visit.          Objective      OBJECTIVE      BP 112/64    Pulse 82    Temp 36.7 C (98 F)    Ht 1.88 m (6\' 2" )    Wt 82.6 kg (182 lb)    SpO2 100%    BMI 23.37 kg/m     Physical Exam  Constitutional:       General: He is not in acute distress.     Appearance: He is not ill-appearing, toxic-appearing or diaphoretic.   HENT:      Nose: Nose normal. No congestion or rhinorrhea.      Mouth/Throat:      Mouth: Mucous membranes are dry.      Pharynx: Oropharynx is clear. No oropharyngeal exudate or posterior oropharyngeal erythema.   Eyes:      General: No scleral icterus.        Right eye: No discharge.         Left eye: No discharge.      Conjunctiva/sclera: Conjunctivae normal.      Pupils: Pupils are equal, round, and reactive to light.   Cardiovascular:      Rate and Rhythm: Normal rate and regular rhythm.  Heart sounds: Normal heart sounds.   Pulmonary:      Effort: Pulmonary effort is normal. No respiratory distress.      Breath sounds: Normal breath sounds.   Abdominal:      General: Abdomen is flat. Bowel sounds are normal. There is no distension.      Palpations: Abdomen is soft.      Tenderness: There is no abdominal tenderness.      Comments: Unable to palpate liver or spleen- however patient tensing   Skin:     General: Skin is warm and dry.      Capillary Refill: Capillary refill takes less than 2 seconds.      Coloration: Skin is not jaundiced or pale.   Neurological:      General: No focal deficit present.      Mental Status: He is alert and oriented to person, place, and time. Mental status is at baseline.   Psychiatric:         Mood and Affect: Mood normal.         Behavior: Behavior normal.         Thought Content: Thought content normal.              ASSESSMENT / DIAGNOSIS         1. Back pain  -No acute issues today, pain is back to baseline    2. Sickle cell disease with HPFH  Underlying condition, all treatment decisions made with this in consideration       3. Primary sclerosing cholangitis  -MRI  scheduled  -Appointment for GI made with our [facility] staff today at check out      Orders Placed This Encounter   No orders placed during this encounter.     There are no Patient Instructions on file for this visit.       --Patient instructed to call if symptoms are not improving or worsening  --Follow-up arranged  Appointment with Dr. Demetrius Charity made      Electronically signed by Palma Holter, NP , 03/21/2020 @   [facility], Phone: [phone]

## 2020-03-21 NOTE — Progress Notes (Signed)
Would like appt with GI as well.  No acute concerns.  I Lucinda Dell, RN, personally documented the review of systems and the past medical, family, social history. I obtained history of present illness and presented to the billing provider.

## 2020-03-23 ENCOUNTER — Telehealth: Payer: Self-pay | Admitting: Student in an Organized Health Care Education/Training Program

## 2020-03-23 ENCOUNTER — Other Ambulatory Visit: Payer: Self-pay

## 2020-03-23 NOTE — Telephone Encounter (Signed)
Copied from CRM [id number]. Topic: Appointments - Reschedule Appointment  >> Mar 23, 2020  2:01 PM Tona Sensing T wrote:  Rene Kocher the care manager is calling to reschedule the patients missed 03/01/20 with Dr.Lee. Writer unable to schedule. Rene Kocher can be reached at [phone]

## 2020-03-23 NOTE — Progress Notes (Signed)
CM spoke with Adain today to discuss an appointment with GI. His PCP would like him to be seen by GI. CM called [facility] to confirm his dates of availability which he states are Mondays all day and Tuesday mornings. CM reached to GI to schedule an was told to expect a call back due to the doctor seen in the past is  no longer with  the office and they will need to figure out who to schedule him with. CM provided a contact number to be called back to have the appointment scheduled. If CM does not receive a call in 24hrs CM will call GI again.

## 2020-03-25 ENCOUNTER — Other Ambulatory Visit: Payer: Self-pay | Admitting: Internal Medicine

## 2020-03-25 DIAGNOSIS — D571 Sickle-cell disease without crisis: Secondary | ICD-10-CM

## 2020-03-26 ENCOUNTER — Ambulatory Visit: Payer: Medicaid Other

## 2020-03-28 ENCOUNTER — Other Ambulatory Visit: Payer: Self-pay

## 2020-03-28 ENCOUNTER — Telehealth: Payer: Self-pay | Admitting: Internal Medicine

## 2020-03-28 ENCOUNTER — Encounter: Payer: Self-pay | Admitting: Internal Medicine

## 2020-03-28 DIAGNOSIS — D571 Sickle-cell disease without crisis: Secondary | ICD-10-CM

## 2020-03-28 MED ORDER — OXYCODONE HCL 10 MG PO TABS *I*
10.0000 mg | ORAL_TABLET | ORAL | 0 refills | Status: DC | PRN
Start: 2020-03-28 — End: 2020-04-11
  Filled 2020-03-28: qty 84, 14d supply, fill #0

## 2020-03-28 NOTE — Telephone Encounter (Signed)
03/28/20   4:41 PM       Out of pain medicaitons  Requested a refill on 03/25/20- doesn't appear to have been pushed through to the provider  Reminded Toriano to give a 3 business day ahead request for medications in order to avoid off shift refills    Reminded Domanic to call about MRI and reschedule with GI provider.   Willet stated that he will do that tomorrow  Jodi Geralds, NP

## 2020-03-29 ENCOUNTER — Other Ambulatory Visit: Payer: Medicaid Other

## 2020-03-29 NOTE — Telephone Encounter (Signed)
Called patient. Rescheduled missed fuv with dr. Steele Berg to 8/10 at 8:30am on ac5. Patient confirmed. Advised patient of appt location and to arrive 15 mins early.

## 2020-03-29 NOTE — Telephone Encounter (Signed)
Called patient's care Production designer, theatre/television/film. Unable to leave vm as the phone just kept ringing.

## 2020-04-10 ENCOUNTER — Other Ambulatory Visit: Payer: Self-pay

## 2020-04-11 ENCOUNTER — Other Ambulatory Visit: Payer: Self-pay | Admitting: Internal Medicine

## 2020-04-11 DIAGNOSIS — D571 Sickle-cell disease without crisis: Secondary | ICD-10-CM

## 2020-04-12 ENCOUNTER — Other Ambulatory Visit: Payer: Self-pay

## 2020-04-12 MED ORDER — OXYCODONE HCL 10 MG PO TABS *I*
10.0000 mg | ORAL_TABLET | ORAL | 0 refills | Status: DC | PRN
Start: 2020-04-12 — End: 2020-04-26
  Filled 2020-04-12: qty 84, 14d supply, fill #0

## 2020-04-12 NOTE — Telephone Encounter (Signed)
Patient Name: Cameron [name]   Birth Date: 1996/03/02   Address: [address] [place], [place] [address]   Sex: Male   Rx Written Rx Dispensed Drug Quantity Days Supply Prescriber Name Prescriber Dea # Payment Method Dispenser   03/28/2020 03/28/2020 oxycodone hcl 10 mg tablet  [address] Jodi Geralds [id number] Other [facility]   03/08/2020 03/10/2020 oxycodone hcl 10 mg tablet  [address] Jodi Geralds [id number] Other [facility]   02/27/2020 02/27/2020 oxycodone hcl 10 mg tablet  [name], [name] MD [id number] Other [facility]   02/04/2020 02/08/2020 oxycodone hcl 10 mg tablet  [address] Jodi Geralds [id number] Other [facility]   01/26/2020 01/26/2020 oxycodone hcl 10 mg tablet  70 [address] [place] Grice [id number] Other [facility]   01/12/2020 01/12/2020 oxycodone hcl 10 mg tablet  [address] Oliva Bustard [id number] Other [facility]   12/29/2019 12/29/2019 oxycodone hcl 10 mg tablet  70 [address] [place] Grice [id number] Other [facility]   11/27/2019 11/27/2019 oxycodone hcl 10 mg tablet  150 [address] [place] Grice [id number] Other [facility]      Patient Name: Cameron [name]   Birth Date: [birth year]   Address: [address] [place], [place] [address]   Sex: Male   Rx Written Rx Dispensed Drug Quantity Days Supply Prescriber Name Prescriber Dea # Payment Method Dispenser   11/02/2019 11/02/2019 oxycodone hcl 10 mg tablet  150 25 [address]wenith Spitz MD [id number] Other [facility]   09/29/2019 09/29/2019 oxycodone hcl 10 mg tablet  150 25 Jodi Geralds [id number] Other [facility]   08/26/2019 08/28/2019 oxycodone hcl 10 mg tablet  150 25 [address]wen[address] Spitz MD [id number] Other [facility]   08/04/2019 08/04/2019 oxycodone hcl 10 mg tablet  150 25 Pulc[address]Gwenith Spitz MD [id number] Other  [facility]   07/29/2019 07/29/2019 oxycodone hcl 10 mg tablet  42 7 Jodi Geralds [id number] Other [facility]   06/29/2019 06/29/2019 oxycodone hcl 10 mg tablet  150 25 Jodi Geralds [id number] Other [facility]   05/29/2019 05/29/2019 oxycodone hcl 10 mg tablet  150 25 Benay Pillow MD [id number] Other [facility]   04/28/2019 04/28/2019 oxycodone hcl 10 mg tablet  [address] Jodi Geralds [id number] Other [facility]   * - Drugs marked with an asterisk are compound drugs. If the compound drug is made up of more than one controlled substance, then each controlled substance will be a separate row in the table.

## 2020-04-19 ENCOUNTER — Encounter: Payer: Self-pay | Admitting: Gastroenterology

## 2020-04-19 ENCOUNTER — Encounter: Payer: Self-pay | Admitting: Primary Care

## 2020-04-19 ENCOUNTER — Ambulatory Visit: Payer: Medicaid Other | Admitting: Primary Care

## 2020-04-19 ENCOUNTER — Other Ambulatory Visit: Payer: Self-pay

## 2020-04-19 DIAGNOSIS — K5190 Ulcerative colitis, unspecified, without complications: Secondary | ICD-10-CM

## 2020-04-19 DIAGNOSIS — D564 Hereditary persistence of fetal hemoglobin [HPFH]: Secondary | ICD-10-CM

## 2020-04-19 DIAGNOSIS — D571 Sickle-cell disease without crisis: Secondary | ICD-10-CM

## 2020-04-19 DIAGNOSIS — E611 Iron deficiency: Secondary | ICD-10-CM

## 2020-04-19 NOTE — Progress Notes (Signed)
CM reached out to Maximillion to remind him of his appointment today with the PCP and to assess for any barriers to attending to this appointment today. Mckinnley states that he is aware of the appointment via telemedicine today and does plan to attend. CM also reviewed and confirmed with Jensen that he is aware of his appointment with G[facility]. on August 10 Krishiv states that he is aware and plans to attend that appointment as well. CM assessed for transportation or any other barriers and Romin states that he is all set for now  and will be attending the appointment on August 10. When asked Madsen states  there are no other CM needs at this time,  CM encouraged Bentzion to please reach out to CM on the cell number or the office phone if that changes.

## 2020-04-19 NOTE — Progress Notes (Signed)
[facility]  SICKLE CELL - ACUTE TELEMEDICINE VISIT      CHIEF COMPLAINT     Chief Complaint   Patient presents with    Sickle Cell Anemia    Ulcerative Colitis       Subjective   TELEMEDICINE CONSENT     Visit being conducted by Video in lieu of a face to face visit to minimize health care worker and patient exposure to COVID-19, and conserve PPE.    Location of Patient: home  Location of Telemedicine Provider: hospital / clinical location  Other Participants in telemedicine encounter and roles: none    Consent was obtained from the patient to complete this telemedicine visit; including the potential for financial liability.  How did the patient provide consent? Verbal Consent Only    SUBJECTIVE   History of HgSS     Sickle Cell disease:  - back pain remains major area of discomfort  - no episodes of pain uncontrolled with home regimen  - recent travel with no exacerbations  - has not had to call out of work due to crisis pain or fatigue    Outpatient Pain Regimen:  Long acting: none  Short acting; Oxy IR 10mg  Q4 hrs prn pain  Home Based titration regimen: none, needs assessment due to uncontrolled UC0  Adjunct therapies: none, holding NSAID due to ongoing LGI bleeding  Other: none    Current disease modifying therapy: none    Emergency Room visits for this event: 0  Hospitalizations for this event: 0    Ulcerative Colitis:  Taking medication consistently mesalamine at least once a day - some days able to get in twice a day  stooling about once a day without blood our mucous  No urgency noted  No oral lesions or other skin lesions    Medications Reviewed and changes     Current Outpatient Medications:     oxyCODONE (ROXICODONE) 10 MG immediate release tablet, Take 1 tablet (10 mg total) by mouth every 4 hours as needed for Pain. Max daily dose: 6 tablets, Disp: 84 tablet, Rfl: 0    cyclobenzaprine (FLEXERIL) 5 MG tablet, Take 1 tablet (5 mg total) by mouth 3 times daily as needed for Muscle  spasms, Disp: 60 tablet, Rfl: 0    mesalamine (ASACOL HD) 800 MG tablet, TAKE 3 TABLETS (2,400 MG TOTAL) BY MOUTH 2 TIMES DAILY, Disp: 180 tablet, Rfl: 2    ergocalciferol (ERGOCALCIFEROL) 50000 UNIT capsule, Take 1 capsule (50,000 units total) by mouth once a week, Disp: 12 capsule, Rfl: 0    SUMAtriptan (IMITREX) 50 MG tablet, Take 1 tablet (50 mg total) by mouth as needed for Migraine Take at onset of headache. May repeat once in 2 hours., Disp: 2 tablet, Rfl: 0    diclofenac (VOLTAREN) 1 % gel, Apply 4 g to affected area 4 times daily., Disp: 100 g, Rfl: 2    ferrous sulfate 325 (65 FE) MG tablet, Take 1 tablet (325 mg total) by mouth 2 times daily (with meals), Disp: 100 tablet, Rfl: 3    acetaminophen (TYLENOL) 500 mg tablet, Take 500 mg by mouth every 6 hours as needed, Disp: , Rfl:     naloxone (NARCAN) 4 mg/0.1 mL nasal spray, Instill 1 spray in 1 nostril once for opioid reversal. Repeat in alternating nostrils with new package every 2-3 minutes until response (Patient not taking: Reported on 03/21/2020), Disp: 2 each, Rfl: 5  Review of Systems   Constitutional: Negative  for activity change, appetite change, fatigue and fever.   Gastrointestinal: Negative for abdominal distention, abdominal pain, anal bleeding, diarrhea, nausea and vomiting.   Musculoskeletal: Negative for arthralgias.          Objective   OBJECTIVE   PHYSICAL EXAM  This visit was performed during a pandemic event, and the physical exam was limited to my Video observation of this patient's organ systems and/or body areas.   GEN:  alert, looks comfortable - no scleral icterus        Lab results: 03/15/20  0110   WBC 8.7   Hemoglobin 7.2*   Hematocrit 25*   RBC 4.0*   Platelets 377*          ASSESSMENT / DIAGNOSIS   Julien was seen today for sickle cell anemia and ulcerative colitis.    Diagnoses and all orders for this visit:    Sickle cell disease with HPFH  Stable currently - having pain in back about 3-4 times per week exacerbated  by activity or other stressors like change in weather - encouraged participation in PT  Continue current pain management regimen    Ulcerative colitis  Sx appear stable  Has been doing a fantastic job improving medication adherence for disease control  Reviewed he is due for monitoring colonoscopy and timing and location of upcoming appointment with Dr. Steele Berg of GI    Iron deficiency  Last venofer infusion 01/25/20  Deficiency due to uncontrolled UC in the setting of SCD  Repeat iron studies ordered     Encouraged Riker to have labs prior to GI appointment to have information to review with Dr. Steele Berg     Electronically signed by Signed: Benay Pillow, MD on 04/19/2020   [facility]

## 2020-04-25 ENCOUNTER — Other Ambulatory Visit: Payer: Self-pay

## 2020-04-25 ENCOUNTER — Other Ambulatory Visit: Payer: Self-pay | Admitting: Pediatrics

## 2020-04-25 DIAGNOSIS — D571 Sickle-cell disease without crisis: Secondary | ICD-10-CM

## 2020-04-25 NOTE — Telephone Encounter (Signed)
Search Terms: Virgel Haro, [birth year]   Search Date: 04/25/2020 10:35:55 AM   Searching on behalf of: [id number] - [name]   The Drug Utilization Report below displays all of the controlled substance prescriptions, if any, that your patient has filled in the last twelve months. The information displayed on this report is compiled from pharmacy submissions to the Department, and accurately reflects the information as submitted by the pharmacies.  This report was requested by: [name]   Reference #: [id number]   Others' Prescriptions  Patient Name: Cameron [name]   Birth Date: 1996/09/24   Address: [address] [place], [place] [address]   Sex: Male   Rx Written Rx Dispensed Drug Quantity Days Supply Prescriber Name Prescriber Dea # Payment Method Dispenser   04/12/2020 04/12/2020 oxycodone hcl 10 mg tablet  84 14 Midge Minium [id number] Other [facility]   03/28/2020 03/28/2020 oxycodone hcl 10 mg tablet  [address] Jodi Geralds [id number] Other [facility]   03/08/2020 03/10/2020 oxycodone hcl 10 mg tablet  [address] Jodi Geralds [id number] Other [facility]   02/27/2020 02/27/2020 oxycodone hcl 10 mg tablet  70 12 [name], [name] MD [id number] Other [facility]   02/04/2020 02/08/2020 oxycodone hcl 10 mg tablet  [address] Jodi Geralds [id number] Other [facility]   01/26/2020 01/26/2020 oxycodone hcl 10 mg tablet  70 12 York Grice [id number] Other [facility]   01/12/2020 01/12/2020 oxycodone hcl 10 mg tablet  [address] Oliva Bustard [id number] Other [facility]   12/29/2019 12/29/2019 oxycodone hcl 10 mg tablet  70 12 York Grice [id number] Other [facility]   11/27/2019 11/27/2019 oxycodone hcl 10 mg tablet  150 25 York Grice [id number] Other [facility]      Patient Name: Cameron [name]   Birth Date: 1996-05-29   Address: [address] [place], [place] [address]   Sex: Male   Rx Written Rx Dispensed Drug Quantity Days Supply Prescriber Name Prescriber Dea # Payment Method Dispenser   11/02/2019 11/02/2019 oxycodone hcl 10 mg tablet  150 25 PulcinoGwenith Spitz MD [id number] Other [facility]   09/29/2019 09/29/2019 oxycodone hcl 10 mg tablet  150 25 Jodi Geralds [id number] Other [facility]   08/26/2019 08/28/2019 oxycodone hcl 10 mg tablet  150 25 PulcinoGwenith Spitz MD [id number] [facility]   08/04/2019 08/04/2019 oxycodone hcl 10 mg tablet  150 25 PulcinoGwenith Spitz MD [id number] Other [facility]   07/29/2019 07/29/2019 oxycodone hcl 10 mg tablet  42 7 Jodi Geralds [id number] Other [facility]   06/29/2019 06/29/2019 oxycodone hcl 10 mg tablet  150 25 Jodi Geralds [id number] [facility]   05/29/2019 05/29/2019 oxycodone hcl 10 mg tablet  150 25 Benay Pillow MD [id number] Other [facility]   04/28/2019 04/28/2019 oxycodone hcl 10 mg tablet  [address] Jodi Geralds [id number] Other [facility]

## 2020-04-26 ENCOUNTER — Other Ambulatory Visit: Payer: Self-pay

## 2020-04-26 ENCOUNTER — Encounter: Payer: Self-pay | Admitting: Internal Medicine

## 2020-04-26 DIAGNOSIS — D571 Sickle-cell disease without crisis: Secondary | ICD-10-CM

## 2020-04-26 MED ORDER — OXYCODONE HCL 10 MG PO TABS *I*
10.0000 mg | ORAL_TABLET | ORAL | 0 refills | Status: DC | PRN
Start: 2020-04-26 — End: 2020-05-11
  Filled 2020-04-26: qty 84, 14d supply, fill #0

## 2020-04-26 NOTE — Telephone Encounter (Signed)
Patient Name: Cameron [name]   Birth Date: Jul 04, 1996   Address: [address] [place], [place] [address]   Sex: Male   Rx Written Rx Dispensed Drug Quantity Days Supply Prescriber Name Prescriber Dea # Payment Method Dispenser   11/02/2019 11/02/2019 oxycodone hcl 10 mg tablet  150 25 PulcinoGwenith Spitz MD [id number] Other [facility]   09/29/2019 09/29/2019 oxycodone hcl 10 mg tablet  150 25 Jodi Geralds [id number] [facility]   08/26/2019 08/28/2019 oxycodone hcl 10 mg tablet  150 25 PulcinoGwenith Spitz MD [id number] [facility]   08/04/2019 08/04/2019 oxycodone hcl 10 mg tablet  150 25 PulcinoGwenith Spitz MD [id number] [facility]   07/29/2019 07/29/2019 oxycodone hcl 10 mg tablet  42 7 Jodi Geralds [id number] Other [facility]   06/29/2019 06/29/2019 oxycodone hcl 10 mg tablet  150 25 Jodi Geralds [id number] [facility]   05/29/2019 05/29/2019 oxycodone hcl 10 mg tablet  150 25 PulcinoGwenith Spitz MD [id number] Other [facility]   04/28/2019 04/28/2019 oxycodone hcl 10 mg tablet  [address] Jodi Geralds [id number] Other [facility]      Patient Name: Cameron [name]   Birth Date: Mar 31, 1996   Address: [address] [place], [place] [address]   Sex: Male   Rx Written Rx Dispensed Drug Quantity Days Supply Prescriber Name Prescriber Dea # Payment Method Dispenser   04/12/2020 04/12/2020 oxycodone hcl 10 mg tablet  84 14 Midge Minium [id number] [facility]   03/28/2020 03/28/2020 oxycodone hcl 10 mg tablet  [address] Jodi Geralds [id number] [facility]   03/08/2020 03/10/2020 oxycodone hcl 10 mg tablet  [address] Jodi Geralds [id number] [facility]   02/27/2020 02/27/2020 oxycodone hcl 10 mg tablet  70 12 [name], [name] MD [id number] Other [facility]   02/04/2020 02/08/2020 oxycodone hcl 10 mg tablet  [address] Jodi Geralds [id number] [facility]   01/26/2020 01/26/2020 oxycodone hcl 10 mg tablet  70 12 York Grice [id number] [facility]   01/12/2020 01/12/2020 oxycodone hcl 10 mg tablet  [address] Oliva Bustard [id number] [facility]   12/29/2019 12/29/2019 oxycodone hcl 10 mg tablet  70 12 York Grice [id number] Other [facility]   11/27/2019 11/27/2019 oxycodone hcl 10 mg tablet  150 25 York Grice [id number] Other [facility]

## 2020-04-29 ENCOUNTER — Other Ambulatory Visit: Payer: Self-pay

## 2020-05-03 ENCOUNTER — Ambulatory Visit: Payer: Medicaid Other | Admitting: Student in an Organized Health Care Education/Training Program

## 2020-05-03 ENCOUNTER — Telehealth: Payer: Self-pay

## 2020-05-03 NOTE — Telephone Encounter (Signed)
Patient was a No SHOW for a follow up visit with Dr. Dorris Carnes. [facility] on 8.10.21. Letter sent to patient.

## 2020-05-04 ENCOUNTER — Other Ambulatory Visit: Payer: Self-pay

## 2020-05-04 NOTE — Progress Notes (Signed)
On 8/9 CM reached out to Cameron [name] to remind him of his appointment tomorrow  with the [facility] and to assess for any barriers to attending to this appointment. Cameron [name] states  that he is aware of the appointment and that he plans to arrive at 8:15am.  CM assessed for any other care management needs at this time and Cameron [name] states that there is none. CM encouraged Cameron [name] to reach out if that changes and Cm will remind available for any care management needs.

## 2020-05-08 ENCOUNTER — Other Ambulatory Visit: Payer: Self-pay | Admitting: Primary Care

## 2020-05-08 DIAGNOSIS — D571 Sickle-cell disease without crisis: Secondary | ICD-10-CM

## 2020-05-09 ENCOUNTER — Other Ambulatory Visit: Payer: Self-pay

## 2020-05-09 NOTE — Telephone Encounter (Signed)
Confidential Drug Report  Search Terms: [name], [birth year]   Search Date: 05/09/2020 10:35:21 AM   Searching on behalf of: [id number] - [name]   The Drug Utilization Report below displays all of the controlled substance prescriptions, if any, that your patient has filled in the last twelve months. The information displayed on this report is compiled from pharmacy submissions to the Department, and accurately reflects the information as submitted by the pharmacies.  This report was requested by: Alvira Philips   Reference #: [id number]   Others' Prescriptions  Patient Name: Cameron [name]   Birth Date: 1996-02-25   Address: [address] [place], [place] [address]   Sex: Male   Rx Written Rx Dispensed Drug Quantity Days Supply Prescriber Name Prescriber Dea # Payment Method Dispenser   11/02/2019 11/02/2019 oxycodone hcl 10 mg tablet  150 25 PulcinoGwenith Spitz MD [id number] Other [facility]   09/29/2019 09/29/2019 oxycodone hcl 10 mg tablet  150 25 Jodi Geralds [id number] [facility]   08/26/2019 08/28/2019 oxycodone hcl 10 mg tablet  150 25 PulcinoGwenith Spitz MD [id number] Other [facility]   08/04/2019 08/04/2019 oxycodone hcl 10 mg tablet  150 25 PulcinoGwenith Spitz MD [id number] [facility]   07/29/2019 07/29/2019 oxycodone hcl 10 mg tablet  42 7 Jodi Geralds [id number] [facility]   06/29/2019 06/29/2019 oxycodone hcl 10 mg tablet  150 25 Jodi Geralds [id number] Other [facility]   05/29/2019 05/29/2019 oxycodone hcl 10 mg tablet  150 25 Benay Pillow MD [id number] Other [facility]      Patient Name: [name]   Birth Date: 14-May-1996   Address: [address] [place], [place] [address]   Sex: Male   Rx Written Rx Dispensed Drug Quantity Days Supply Prescriber Name Prescriber Dea # Payment Method Dispenser   04/26/2020  04/26/2020 oxycodone hcl 10 mg tablet  84 14 [name], [name] [id number] Other [facility]   04/12/2020 04/12/2020 oxycodone hcl 10 mg tablet  84 14 Midge Minium [id number] [facility]   03/28/2020 03/28/2020 oxycodone hcl 10 mg tablet  84 14 Jodi Geralds [id number] [facility]   03/08/2020 03/10/2020 oxycodone hcl 10 mg tablet  [address] Jodi Geralds [id number] Other [facility]   02/27/2020 02/27/2020 oxycodone hcl 10 mg tablet  70 12 [name], [name] MD [id number] Other [facility]   02/04/2020 02/08/2020 oxycodone hcl 10 mg tablet  [address] Jodi Geralds [id number] [facility]   01/26/2020 01/26/2020 oxycodone hcl 10 mg tablet  70 12 York Grice [id number] Other [facility]   01/12/2020 01/12/2020 oxycodone hcl 10 mg tablet  [address] Oliva Bustard [id number] [facility]   12/29/2019 12/29/2019 oxycodone hcl 10 mg tablet  70 12 York Grice [id number] Other [facility]   11/27/2019 11/27/2019 oxycodone hcl 10 mg tablet  150 25 York Grice [id number] Other [facility]   * - Drugs marked with an asterisk are compound drugs. If the compound drug is made up of more than one controlled substance, then each controlled substance will be a separate row  in the table.   [place]

## 2020-05-10 ENCOUNTER — Other Ambulatory Visit: Payer: Self-pay

## 2020-05-10 ENCOUNTER — Encounter: Payer: Self-pay | Admitting: Oncology

## 2020-05-10 DIAGNOSIS — D571 Sickle-cell disease without crisis: Secondary | ICD-10-CM

## 2020-05-10 NOTE — Telephone Encounter (Signed)
Looks like med is pended in previous encounter.     This report was requested by: Janyce Llanos   Reference #: [id number]   Others' Prescriptions  Patient Name: Cameron [name]   Birth Date: Sep 23, 1996   Address: [address] [place], [place] [address]   Sex: Male   Rx Written Rx Dispensed Drug Quantity Days Supply Prescriber Name Prescriber Dea # Payment Method Dispenser   04/26/2020 04/26/2020 oxycodone hcl 10 mg tablet  84 14 [name], [name] MD [id number] Other [facility]   04/12/2020 04/12/2020 oxycodone hcl 10 mg tablet  84 14 Midge Minium [id number] Other [facility]   03/28/2020 03/28/2020 oxycodone hcl 10 mg tablet  84 14 Jodi Geralds [id number] [facility]   03/08/2020 03/10/2020 oxycodone hcl 10 mg tablet  [address] Jodi Geralds [id number] Other [facility]   02/27/2020 02/27/2020 oxycodone hcl 10 mg tablet  70 12 [name], [name] MD [id number] Other [facility]   02/04/2020 02/08/2020 oxycodone hcl 10 mg tablet  [address] Jodi Geralds [id number] Other [facility]   01/26/2020 01/26/2020 oxycodone hcl 10 mg tablet  70 12 York Grice [id number] Other [facility]   01/12/2020 01/12/2020 oxycodone hcl 10 mg tablet  [address] Oliva Bustard [id number] Other [facility]   12/29/2019 12/29/2019 oxycodone hcl 10 mg tablet  70 12 York Grice [id number] Other [facility]   11/27/2019 11/27/2019 oxycodone hcl 10 mg tablet  150 25 York Grice [id number] Other [facility]      Patient Name: Cameron [name]   Birth Date: 21-Nov-1995   Address: [address] [place], [place] [address]   Sex: Male   Rx Written Rx Dispensed Drug Quantity Days Supply Prescriber Name Prescriber Dea # Payment Method Dispenser   11/02/2019 11/02/2019 oxycodone hcl 10 mg tablet  150 25 PulcinoGwenith Spitz MD  [id number] Other [facility]   09/29/2019 09/29/2019 oxycodone hcl 10 mg tablet  150 25 Jodi Geralds [id number] Other [facility]   08/26/2019 08/28/2019 oxycodone hcl 10 mg tablet  150 25 PulcinoGwenith Spitz MD [id number] Other [facility]   08/04/2019 08/04/2019 oxycodone hcl 10 mg tablet  150 25 PulcinoGwenith Spitz MD [id number] Other [facility]   07/29/2019 07/29/2019 oxycodone hcl 10 mg tablet  42 7 Jodi Geralds [id number] [facility]   06/29/2019 06/29/2019 oxycodone hcl 10 mg tablet  150 25 Jodi Geralds [id number] Other [facility]   05/29/2019 05/29/2019 oxycodone hcl 10 mg tablet  150 25 PulcinoGwenith Spitz MD [id number] Other [facility]   * - Drugs marked with an asterisk are compound drugs. If the compound drug is made up of more

## 2020-05-11 ENCOUNTER — Other Ambulatory Visit: Payer: Self-pay

## 2020-05-11 MED ORDER — OXYCODONE HCL 10 MG PO TABS *I*
10.0000 mg | ORAL_TABLET | ORAL | 0 refills | Status: DC | PRN
Start: 2020-05-11 — End: 2020-05-22
  Filled 2020-05-11: qty 84, 14d supply, fill #0

## 2020-05-11 NOTE — Telephone Encounter (Signed)
rx sent - patient has not rescheduled missed appointment with GI yet - critical that he engages in follow up given dx of UC and due for monitoring colonoscopy

## 2020-05-14 ENCOUNTER — Other Ambulatory Visit: Payer: Self-pay

## 2020-05-22 ENCOUNTER — Other Ambulatory Visit: Payer: Self-pay | Admitting: Primary Care

## 2020-05-22 DIAGNOSIS — D571 Sickle-cell disease without crisis: Secondary | ICD-10-CM

## 2020-05-23 ENCOUNTER — Other Ambulatory Visit: Payer: Self-pay

## 2020-05-23 NOTE — Telephone Encounter (Signed)
Patient Name: Cameron [name]   Birth Date: 1996-05-24   Address: [address] [place], [place] [address]   Sex: Male   Rx Written Rx Dispensed Drug Quantity Days Supply Prescriber Name Prescriber Dea # Payment Method Dispenser   05/11/2020 05/11/2020 oxycodone hcl 10 mg tablet  84 14 [name], [name] MD [id number] Other [facility]   04/26/2020 04/26/2020 oxycodone hcl 10 mg tablet  84 14 [name], [name] [id number] Other [facility]   04/12/2020 04/12/2020 oxycodone hcl 10 mg tablet  84 14 Midge Minium [id number] Other [facility]   03/28/2020 03/28/2020 oxycodone hcl 10 mg tablet  84 14 Jodi Geralds [id number] [facility]   03/08/2020 03/10/2020 oxycodone hcl 10 mg tablet  [address] Jodi Geralds [id number] [facility]   02/27/2020 02/27/2020 oxycodone hcl 10 mg tablet  70 12 [name], [name] MD [id number] Other [facility]   02/04/2020 02/08/2020 oxycodone hcl 10 mg tablet  [address] Jodi Geralds [id number] Other [facility]   01/26/2020 01/26/2020 oxycodone hcl 10 mg tablet  70 12 York Grice [id number] [facility]   01/12/2020 01/12/2020 oxycodone hcl 10 mg tablet  [address] Oliva Bustard [id number] Other [facility]   12/29/2019 12/29/2019 oxycodone hcl 10 mg tablet  70 12 York Grice [id number] Other [facility]   11/27/2019 11/27/2019 oxycodone hcl 10 mg tablet  150 25 York Grice [id number] Other [facility]      Patient Name: Cameron [name]   Birth Date: 11-29-1995   Address: [address] [place], [place] [address]   Sex: Male   Rx Written Rx Dispensed Drug Quantity Days Supply Prescriber Name Prescriber Dea # Payment Method Dispenser   11/02/2019 11/02/2019 oxycodone hcl 10 mg tablet  150 25 PulcinoGwenith Spitz MD [id number] [facility]   09/29/2019 09/29/2019 oxycodone hcl 10 mg tablet  150 25 Jodi Geralds [id number] [facility]   08/26/2019 08/28/2019 oxycodone hcl 10 mg tablet  150 25 PulcinoGwenith Spitz MD [id number] [facility]   08/04/2019 08/04/2019 oxycodone hcl 10 mg tablet  150 25 PulcinoGwenith Spitz MD [id number] [facility]   07/29/2019 07/29/2019 oxycodone hcl 10 mg tablet  42 7 Jodi Geralds [id number] [facility]   06/29/2019 06/29/2019 oxycodone hcl 10 mg tablet  150 25 Jodi Geralds [id number] Other [facility]   05/29/2019 05/29/2019 oxycodone hcl 10 mg tablet  150 25 PulcinoGwenith Spitz MD [id number] Other [facility]

## 2020-05-24 ENCOUNTER — Other Ambulatory Visit: Payer: Self-pay

## 2020-05-24 MED ORDER — OXYCODONE HCL 10 MG PO TABS *I*
10.0000 mg | ORAL_TABLET | ORAL | 0 refills | Status: DC | PRN
Start: 2020-05-24 — End: 2020-06-06
  Filled 2020-05-24: qty 84, 14d supply, fill #0

## 2020-05-25 ENCOUNTER — Other Ambulatory Visit: Payer: Self-pay

## 2020-06-06 ENCOUNTER — Other Ambulatory Visit: Payer: Self-pay | Admitting: Primary Care

## 2020-06-06 DIAGNOSIS — D571 Sickle-cell disease without crisis: Secondary | ICD-10-CM

## 2020-06-06 NOTE — Telephone Encounter (Signed)
Patient Name: Cameron [name]   Birth Date: 01-05-1996   Address: [address] [place], [place] [address]   Sex: Male   Rx Written Rx Dispensed Drug Quantity Days Supply Prescriber Name Prescriber Dea # Payment Method Dispenser   11/02/2019 11/02/2019 oxycodone hcl 10 mg tablet  150 25 PulcinoGwenith Spitz MD [id number] Other [facility]   09/29/2019 09/29/2019 oxycodone hcl 10 mg tablet  150 25 Jodi Geralds [id number] Other [facility]   08/26/2019 08/28/2019 oxycodone hcl 10 mg tablet  150 25 PulcinoGwenith Spitz MD [id number] Other [facility]   08/04/2019 08/04/2019 oxycodone hcl 10 mg tablet  150 25 PulcinoGwenith Spitz MD [id number] Other [facility]   07/29/2019 07/29/2019 oxycodone hcl 10 mg tablet  42 7 Jodi Geralds [id number] Other [facility]   06/29/2019 06/29/2019 oxycodone hcl 10 mg tablet  150 [address] Jodi Geralds [id number] Other [facility]      Patient Name: [name]   Birth Date: 1996-08-17   Address: [address] [place], [place] [address]   Sex: Male   Rx Written Rx Dispensed Drug Quantity Days Supply Prescriber Name Prescriber Dea # Payment Method Dispenser   05/24/2020 05/25/2020 oxycodone hcl 10 mg tablet  84 14 [name], [name] MD [id number] Other [facility]   05/11/2020 05/11/2020 oxycodone hcl 10 mg tablet  84 14 [name], [name] [id number] Other [facility]   04/26/2020 04/26/2020 oxycodone hcl 10 mg tablet  84 14 [name], [name] [id number] Other [facility]   04/12/2020 04/12/2020 oxycodone hcl 10 mg tablet  84 14 Midge Minium [id number] Other [facility]   03/28/2020 03/28/2020 oxycodone hcl 10 mg tablet  84 14 Jodi Geralds [id number] [facility]   03/08/2020 03/10/2020 oxycodone hcl 10 mg tablet  [address] Jodi Geralds [id number] Other [facility]   02/27/2020 02/27/2020 oxycodone hcl 10 mg tablet  70 12 [name], [name] MD [id number] Other [facility]   02/04/2020 02/08/2020 oxycodone hcl 10 mg tablet  [address] Jodi Geralds [id number] Other [facility]   01/26/2020 01/26/2020 oxycodone hcl 10 mg tablet  70 12 York Grice [id number] Other [facility]   01/12/2020 01/12/2020 oxycodone hcl 10 mg tablet  [address] Oliva Bustard [id number] Other [facility]   12/29/2019 12/29/2019 oxycodone hcl 10 mg tablet  70 12 York Grice [id number] Other [facility]   11/27/2019 11/27/2019 oxycodone hcl 10 mg tablet  150 25 York Grice [id number] Other [facility]   * - Drugs marked with an asterisk are compound drugs. If the compound drug is made up of more than one controlled substance, then each controlled substance will be a separate row in the table.

## 2020-06-07 ENCOUNTER — Other Ambulatory Visit: Payer: Self-pay

## 2020-06-08 ENCOUNTER — Other Ambulatory Visit: Payer: Self-pay

## 2020-06-08 MED ORDER — OXYCODONE HCL 10 MG PO TABS *I*
10.0000 mg | ORAL_TABLET | ORAL | 0 refills | Status: DC | PRN
Start: 2020-06-08 — End: 2020-06-21
  Filled 2020-06-08: qty 84, 14d supply, fill #0

## 2020-06-20 ENCOUNTER — Other Ambulatory Visit: Payer: Self-pay | Admitting: Primary Care

## 2020-06-20 DIAGNOSIS — D571 Sickle-cell disease without crisis: Secondary | ICD-10-CM

## 2020-06-21 ENCOUNTER — Other Ambulatory Visit: Payer: Self-pay

## 2020-06-21 ENCOUNTER — Telehealth: Payer: Self-pay

## 2020-06-21 ENCOUNTER — Ambulatory Visit: Payer: Medicaid Other | Admitting: Primary Care

## 2020-06-21 ENCOUNTER — Other Ambulatory Visit: Payer: Self-pay | Admitting: Primary Care

## 2020-06-21 ENCOUNTER — Telehealth: Payer: Self-pay | Admitting: Student in an Organized Health Care Education/Training Program

## 2020-06-21 DIAGNOSIS — M25562 Pain in left knee: Secondary | ICD-10-CM

## 2020-06-21 DIAGNOSIS — G8929 Other chronic pain: Secondary | ICD-10-CM

## 2020-06-21 DIAGNOSIS — M25561 Pain in right knee: Secondary | ICD-10-CM

## 2020-06-21 DIAGNOSIS — D571 Sickle-cell disease without crisis: Secondary | ICD-10-CM

## 2020-06-21 NOTE — Telephone Encounter (Signed)
Thank you :)

## 2020-06-21 NOTE — Telephone Encounter (Signed)
Copied from CRM [id number]. Topic: Return Call - Schedule Appointment  >> Jun 21, 2020  2:16 PM Joya Salm wrote:  Rene Kocher- Care Mgr calling to reschedule Patient's missed in office appointment. Writer unable to schedule.  Rene Kocher  may be reached at [phone]

## 2020-06-21 NOTE — Progress Notes (Signed)
[facility]  SICKLE CELL - ACUTE TELEMEDICINE VISIT      CHIEF COMPLAINT   SCD and UC management    Subjective   TELEMEDICINE CONSENT     Visit being conducted by Video in lieu of a face to face visit to minimize health care worker and patient exposure to COVID-19, and conserve PPE.    Location of Patient: home  Location of Telemedicine Provider: hospital / clinical location  Other Participants in telemedicine encounter and roles: none    Consent was obtained from the patient to complete this telemedicine visit; including the potential for financial liability.  How did the patient provide consent? Verbal Consent Only    SUBJECTIVE   History of HgSS     Sickle Cell vaso-occlusive crisis  - Things are "good" feels pain is well controlled and has good energy levels   - Location of pain: knees superiorly only at night when laying down, not when increased activity    Current home management:  - Non-pharmacologic interventions:heat, rest, hot showers and oral hydration  - Non-opioid pain modifying medications: tylenol  and icy hot  - Short acting Opioid pain medications:oxycodone 10 mg Q4  - Long acting Opioid pain medications: None    Current disease modifying therapy: none    Emergency Room visits for this event: 0  Hospitalizations for this event: 0    Ulcerative colitis:   Stooling about once a day   Taking mesalamine 3 tab BID and taking 3-4 days per week  Has not been taking iron  Notes that he is tired  But can't sleep at night - develops weird aching in knees above his knee cap  Taking oxy helps or waits it out - has not tried heating pads yet  Icy hot is also helpful for pain in knees at night   Has been going on a few weeks      Medications Reviewed and changes     Current Outpatient Medications:     oxyCODONE (ROXICODONE) 10 MG immediate release tablet, Take 1 tablet (10 mg total) by mouth every 4 hours as needed for pain. Max daily dose: 6 tablets, Disp: 84 tablet, Rfl: 0     cyclobenzaprine (FLEXERIL) 5 MG tablet, Take 1 tablet (5 mg total) by mouth 3 times daily as needed for Muscle spasms, Disp: 60 tablet, Rfl: 0    mesalamine (ASACOL HD) 800 MG tablet, TAKE 3 TABLETS (2,400 MG TOTAL) BY MOUTH 2 TIMES DAILY, Disp: 180 tablet, Rfl: 2    ergocalciferol (ERGOCALCIFEROL) 50000 UNIT capsule, Take 1 capsule (50,000 units total) by mouth once a week, Disp: 12 capsule, Rfl: 0    SUMAtriptan (IMITREX) 50 MG tablet, Take 1 tablet (50 mg total) by mouth as needed for Migraine Take at onset of headache. May repeat once in 2 hours., Disp: 2 tablet, Rfl: 0    diclofenac (VOLTAREN) 1 % gel, Apply 4 g to affected area 4 times daily., Disp: 100 g, Rfl: 2    ferrous sulfate 325 (65 FE) MG tablet, Take 1 tablet (325 mg total) by mouth 2 times daily (with meals), Disp: 100 tablet, Rfl: 3    acetaminophen (TYLENOL) 500 mg tablet, Take 500 mg by mouth every 6 hours as needed, Disp: , Rfl:     naloxone (NARCAN) 4 mg/0.1 mL nasal spray, Instill 1 spray in 1 nostril once for opioid reversal. Repeat in alternating nostrils with new package every 2-3 minutes until response (Patient not taking:  Reported on 03/21/2020), Disp: 2 each, Rfl: 5  Review of Systems       Objective   OBJECTIVE   PHYSICAL EXAM  This visit was performed during a pandemic event, and the physical exam was limited to my Video observation of this patient's organ systems and/or body areas.   GEN:  alert, looks mildly uncomfortable        Lab results: 03/15/20  0110   WBC 8.7   Hemoglobin 7.2*   Hematocrit 25*   RBC 4.0*   Platelets 377*          ASSESSMENT / DIAGNOSIS   Diagnoses and all orders for this visit:    Left and right knee pain worse after cessation of activity at end of day and keeping awake at night  DDx includes AVN of distal femur and iron defieicny causing RLS with associated cramping pain  Asked patient to obtain plane films and labs to further assess    SCD:   Discussed initiation of HU therapy givne frequency of VOE  events and patient will continue to consider  Continue current pain management - agreement on file and tox monitoring UTD, has narcan at home and keeps medication in safe place    UC:   Working with CM to help him reschedule appointment with GI Dr. Nedra Hai  Will assess iron studies and determine if infusion supports are needed   Patient with minimal sx on mesalamine 60% of dosing success without ability to manage ferrous sulfate supplementation    -Patient instructed to call if symptoms are not improving or worsening    Electronically signed by Signed: Benay Pillow, MD on 06/21/2020   [facility]

## 2020-06-21 NOTE — Telephone Encounter (Signed)
FYI, I reached out to [facility] to reschedule appointment and was told that a scheduler would to call me back to schedule.

## 2020-06-22 ENCOUNTER — Other Ambulatory Visit: Payer: Self-pay

## 2020-06-22 ENCOUNTER — Encounter: Payer: Self-pay | Admitting: Oncology

## 2020-06-22 NOTE — Telephone Encounter (Signed)
Patient Name: Cameron [name]   Birth Date: 1995-11-14   Address: [address] [place], [place] [address]   Sex: Male   Rx Written Rx Dispensed Drug Quantity Days Supply Prescriber Name Prescriber Dea # Payment Method Dispenser   06/08/2020 06/08/2020 oxycodone hcl 10 mg tablet  84 14 [name], [name] MD [id number] Other [facility]   05/24/2020 05/25/2020 oxycodone hcl 10 mg tablet  84 14 [name], [name] MD [id number] [facility]   05/11/2020 05/11/2020 oxycodone hcl 10 mg tablet  84 14 [name], [name] [id number] [facility]   04/26/2020 04/26/2020 oxycodone hcl 10 mg tablet  84 14 [name], [name] [id number] [facility]   04/12/2020 04/12/2020 oxycodone hcl 10 mg tablet  84 14 Midge Minium [id number] Other [facility]   03/28/2020 03/28/2020 oxycodone hcl 10 mg tablet  84 14 Jodi Geralds [id number] [facility]   03/08/2020 03/10/2020 oxycodone hcl 10 mg tablet  [address] Jodi Geralds [id number] [facility]   02/27/2020 02/27/2020 oxycodone hcl 10 mg tablet  70 12 [name], [name] MD [id number] [facility]   02/04/2020 02/08/2020 oxycodone hcl 10 mg tablet  [address] Jodi Geralds [id number] Other [facility]   01/26/2020 01/26/2020 oxycodone hcl 10 mg tablet  70 12 [place] Grice [id number] Other [facility]   01/12/2020 01/12/2020 oxycodone hcl 10 mg tablet  [address] Oliva Bustard [id number] Other [facility]   12/29/2019 12/29/2019 oxycodone hcl 10 mg tablet  70 12 [place] Grice [id number] Other [facility]   11/27/2019 11/27/2019 oxycodone hcl 10 mg tablet  150 25 [place] Grice [id number] Other [facility]      Patient Name: Cameron [name]   Birth Date: September 18, 1996   Address: [address] [place], [place] [address]   Sex: Male   Rx Written Rx Dispensed Drug Quantity Days Supply Prescriber Name Prescriber Dea # Payment Method Dispenser   11/02/2019 11/02/2019 oxycodone hcl 10 mg tablet  150 25 PulcinoGwenith Spitz MD [id number] [facility]   09/29/2019 09/29/2019 oxycodone hcl 10 mg tablet  150 25 Jodi Geralds [id number] [facility]   08/26/2019 08/28/2019 oxycodone hcl 10 mg tablet  150 25 PulcinoGwenith Spitz MD [id number] [facility]   08/04/2019 08/04/2019 oxycodone hcl 10 mg tablet  150 25 PulcinoGwenith Spitz MD [id number] [facility]   07/29/2019 07/29/2019 oxycodone hcl 10 mg tablet  42 7 Jodi Geralds [id number] [facility]   06/29/2019 06/29/2019 oxycodone hcl 10 mg tablet  [address] Jodi Geralds [id number] Other [facility]   * - Drugs marked with an asterisk are compound drugs. If the compound drug is made up of more than one controlled substance, then each controlled substance will be a separate row in the table.

## 2020-06-23 ENCOUNTER — Other Ambulatory Visit: Payer: Self-pay

## 2020-06-23 ENCOUNTER — Encounter: Payer: Self-pay | Admitting: Oncology

## 2020-06-23 MED ORDER — OXYCODONE HCL 10 MG PO TABS *I*
10.0000 mg | ORAL_TABLET | ORAL | 0 refills | Status: DC | PRN
Start: 2020-06-23 — End: 2020-07-07
  Filled 2020-06-23: qty 84, 14d supply, fill #0

## 2020-06-23 NOTE — Telephone Encounter (Signed)
Copied from CRM [id number]. Topic: Appointments - Reschedule Appointment  >> Jun 23, 2020  3:57 PM Joya Salm wrote:  Mr. Gaffin has been scheduled for an appointment. Writer unable to schedule, reached out to back line, spoke with Daromell    Type of appointment: fuv  Date: 09/27/2020  Time: 945 am  Provider: [facility]  Location: [facility]    Patient can be reached if necessary at [phone].

## 2020-06-23 NOTE — Telephone Encounter (Signed)
Refill sent.

## 2020-06-24 ENCOUNTER — Encounter: Payer: Self-pay | Admitting: Emergency Medicine

## 2020-06-24 ENCOUNTER — Ambulatory Visit
Admission: AD | Admit: 2020-06-24 | Discharge: 2020-06-24 | Disposition: A | Discharge status: Home / Self Care | Payer: Medicaid Other | Source: Ambulatory Visit

## 2020-06-24 ENCOUNTER — Other Ambulatory Visit: Payer: Self-pay

## 2020-06-24 DIAGNOSIS — S61412A Laceration without foreign body of left hand, initial encounter: Secondary | ICD-10-CM

## 2020-06-24 DIAGNOSIS — Z23 Encounter for immunization: Secondary | ICD-10-CM

## 2020-06-24 MED ORDER — TETANUS-DIPHTH-ACELL PERT 5-2.5-18.5 LF-MCG/0.5 IM SUSP *WRAPPED*
0.5000 mL | Freq: Once | INTRAMUSCULAR | Status: AC
Start: 2020-06-24 — End: 2020-06-24
  Administered 2020-06-24: 0.5 mL via INTRAMUSCULAR
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
## 2020-06-24 NOTE — Discharge Instructions (Signed)
Keep wound clean and dry until healed.    Change dressing once daily.  Place antibiotic ointment on wound tomorrow, and then wash with soap and water at least once daily and replace dressing.

## 2020-07-07 ENCOUNTER — Other Ambulatory Visit: Payer: Self-pay

## 2020-07-07 ENCOUNTER — Other Ambulatory Visit: Payer: Self-pay | Admitting: Primary Care

## 2020-07-07 DIAGNOSIS — D571 Sickle-cell disease without crisis: Secondary | ICD-10-CM

## 2020-07-08 ENCOUNTER — Other Ambulatory Visit: Payer: Self-pay

## 2020-07-08 ENCOUNTER — Ambulatory Visit: Payer: Medicaid Other | Admitting: Primary Care

## 2020-07-08 DIAGNOSIS — D571 Sickle-cell disease without crisis: Secondary | ICD-10-CM

## 2020-07-08 DIAGNOSIS — D564 Hereditary persistence of fetal hemoglobin [HPFH]: Secondary | ICD-10-CM

## 2020-07-08 DIAGNOSIS — K5190 Ulcerative colitis, unspecified, without complications: Secondary | ICD-10-CM

## 2020-07-08 MED ORDER — OXYCODONE HCL 10 MG PO TABS *I*
10.0000 mg | ORAL_TABLET | ORAL | 0 refills | Status: DC | PRN
Start: 2020-07-08 — End: 2020-07-20
  Filled 2020-07-08: qty 84, 14d supply, fill #0
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
## 2020-07-08 NOTE — Progress Notes (Signed)
Spoke with pt.  Pt reports no pain or further questions/concerns.     Cameron [name], [employer]

## 2020-07-11 ENCOUNTER — Ambulatory Visit: Payer: Medicaid Other

## 2020-07-20 ENCOUNTER — Other Ambulatory Visit: Payer: Self-pay | Admitting: Primary Care

## 2020-07-20 DIAGNOSIS — D571 Sickle-cell disease without crisis: Secondary | ICD-10-CM

## 2020-07-21 ENCOUNTER — Other Ambulatory Visit: Payer: Self-pay

## 2020-07-21 ENCOUNTER — Encounter: Payer: Self-pay | Admitting: Oncology

## 2020-07-21 MED ORDER — OXYCODONE HCL 10 MG PO TABS *I*
10.0000 mg | ORAL_TABLET | ORAL | 0 refills | Status: DC | PRN
Start: 2020-07-21 — End: 2020-08-02
  Filled 2020-07-21: qty 84, 14d supply, fill #0

## 2020-07-27 ENCOUNTER — Ambulatory Visit: Payer: Medicaid Other | Admitting: Internal Medicine

## 2020-07-27 NOTE — Progress Notes (Deleted)
[facility]    Office Visit Note     REASON FOR VISIT      No chief complaint on file.        PRIMARY DIAGNOSIS      ***    Subjective     SUBJECTIVE      Cameron [name] was accompanied by {patient/parent/guardian/caregiver:[id number]} who provided {DESC; MOST / DXA:12878} of subjective history         ***        ROS  Review of Systems as per HPI above    Past Medical History, Social History, Family History, and Medications/allergies reviewed during this visit    Current Outpatient Medications   Medication    oxyCODONE (ROXICODONE) 10 mg immediate release tablet    cyclobenzaprine (FLEXERIL) 5 MG tablet    mesalamine (ASACOL HD) 800 MG tablet    SUMAtriptan (IMITREX) 50 MG tablet    diclofenac (VOLTAREN) 1 % gel    ferrous sulfate 325 (65 FE) MG tablet    acetaminophen (TYLENOL) 500 mg tablet    naloxone (NARCAN) 4 mg/0.1 mL nasal spray     No current facility-administered medications for this visit.          Objective     OBJECTIVE      There were no vitals taken for this visit.    Physical Exam         ASSESSMENT / DIAGNOSIS         There are no diagnoses linked to this encounter.    Orders Placed This Encounter   No orders placed during this encounter.     There are no Patient Instructions on file for this visit.       --Patient instructed to call if symptoms are not improving or worsening  --Follow-up arranged  No follow-ups on file.     {Time Based:  I personally spent {time based:35511} minutes on the calendar day of the encounter, including pre and post visit work reviewing the EMR and management of this patient. }    Electronically signed by Jodi Geralds, NP , 07/27/2020 @   [facility], Phone: [phone]

## 2020-08-02 ENCOUNTER — Other Ambulatory Visit: Payer: Self-pay | Admitting: Primary Care

## 2020-08-02 DIAGNOSIS — D571 Sickle-cell disease without crisis: Secondary | ICD-10-CM

## 2020-08-03 ENCOUNTER — Encounter: Payer: Self-pay | Admitting: Primary Care

## 2020-08-03 NOTE — Telephone Encounter (Signed)
Patient Name: Cameron [name]   Birth Date: [birth year]   Address: [address] [place], [place] [address]   Sex: Male   Rx Written Rx Dispensed Drug Quantity Days Supply Prescriber Name Prescriber Dea # Payment Method Dispenser   11/02/2019 11/02/2019 oxycodone hcl 10 mg tablet  150 25 Benay Pillow MD [id number] Other [facility]   09/29/2019 09/29/2019 oxycodone hcl 10 mg tablet  150 25 Jodi Geralds [id number] [facility]   08/26/2019 08/28/2019 oxycodone hcl 10 mg tablet  150 25 Benay Pillow MD [id number] [facility]      Patient Name: Cameron [name]   Birth Date: 1995/12/25   Address: [address] [place], [place] [address]   Sex: Male   Rx Written Rx Dispensed Drug Quantity Days Supply Prescriber Name Prescriber Dea # Payment Method Dispenser   07/21/2020 07/21/2020 oxycodone hcl 10 mg tablet  84 14 [name], [name] MD [id number] [facility]   07/08/2020 07/08/2020 oxycodone hcl 10 mg tablet  84 14 [name], [name] MD [id number] [facility]   06/23/2020 06/24/2020 oxycodone hcl 10 mg tablet  84 14 [name], [name] MD [id number] [facility]   06/08/2020 06/08/2020 oxycodone hcl 10 mg tablet  84 14 [name], [name] [id number] [facility]   05/24/2020 05/25/2020 oxycodone hcl 10 mg tablet  84 14 [name], [name] [id number] [facility]   05/11/2020 05/11/2020 oxycodone hcl 10 mg tablet  84 14 [name], [name] [id number] [facility]   04/26/2020 04/26/2020 oxycodone hcl 10 mg tablet  84 14 [name], [name] [id number] [facility]   04/12/2020 04/12/2020 oxycodone hcl 10 mg tablet  84 14 Midge Minium [id number] [facility]   03/28/2020 03/28/2020 oxycodone hcl 10 mg tablet  84 14 Jodi Geralds [id number] [facility]   03/08/2020 03/10/2020 oxycodone hcl 10 mg tablet  [address] Jodi Geralds [id number] [facility]   02/27/2020 02/27/2020 oxycodone hcl 10 mg tablet  70 12 [name], [name] MD [id number] [facility]   02/04/2020 02/08/2020 oxycodone hcl 10 mg tablet  [address] Jodi Geralds [id number] [facility]   01/26/2020 01/26/2020 oxycodone hcl 10 mg tablet  70 12 [place] Grice [id number] [facility]   01/12/2020 01/12/2020 oxycodone hcl 10 mg tablet  [address] Oliva Bustard [id number] [facility]   12/29/2019 12/29/2019 oxycodone hcl 10 mg tablet  70 12 [place] Grice [id number] [facility]   11/27/2019 11/27/2019 oxycodone hcl 10 mg tablet  150 25 [place] Grice [id number] Other [facility]   * - Drugs marked with an asterisk are compound drugs. If the compound drug is made up of more than one controlled substance, then each controlled substance will be a separate row in the table.   2021 [facility]

## 2020-08-04 ENCOUNTER — Other Ambulatory Visit: Payer: Self-pay

## 2020-08-04 MED ORDER — OXYCODONE HCL 10 MG PO TABS *I*
10.0000 mg | ORAL_TABLET | ORAL | 0 refills | Status: DC | PRN
Start: 2020-08-04 — End: 2020-08-17
  Filled 2020-08-04: qty 84, 14d supply, fill #0

## 2020-08-04 MED ORDER — MESALAMINE 800 MG PO TBEC *I*
2400.0000 mg | DELAYED_RELEASE_TABLET | Freq: Two times a day (BID) | ORAL | 2 refills | Status: DC
Start: 2020-08-04 — End: 2021-06-01
  Filled 2020-08-04: qty 180, 30d supply, fill #0
  Filled 2020-09-20: qty 180, 30d supply, fill #1
  Filled 2021-02-05: qty 180, 30d supply, fill #2

## 2020-08-11 ENCOUNTER — Other Ambulatory Visit: Payer: Self-pay

## 2020-08-11 NOTE — Progress Notes (Signed)
Person CM attempted to contact: CM attempted to contact Marquavious by phone and text.    Reason: CM attempted to contact to assess for immediate care management needs and follow up on several missed appointments with [facility] over the past few weeks. CM also wanted to provide any supports needed to remain connected with primary and specialty provider care teams as well as offer resources for sickle cell patients of the [facility].    Result: Othello did not answers any phone calls from CM on 11/15, 11/116, and 11/17. CM left a detailed voice mail and requested a call back as well. As of 11/17 Amarion has not returned any phone calls to CM.     Follow Up Plan: CM will continue communication efforts in the following week. If still unable to reach Benito, CM will discuss with providers for any significant information that would be helpful to implement care management interventions.

## 2020-08-15 ENCOUNTER — Encounter: Payer: Self-pay | Admitting: Primary Care

## 2020-08-15 ENCOUNTER — Telehealth: Payer: Self-pay

## 2020-08-15 NOTE — Telephone Encounter (Signed)
Pt sent mychart message with the same concern  Discussing via mychart

## 2020-08-15 NOTE — Telephone Encounter (Signed)
Informed Sarah RN that Shem confirmed appointment with Barkley Bruns at 4:30. No additional CM needs present.

## 2020-08-15 NOTE — Telephone Encounter (Signed)
FYI -     Diesel text me saying he would like an appointment with Tiffany soon due to "excessive pains in lower back getting worse day by day"    I instructed him to call the office ASAP and speak to a nurse an book an appointment     If he doesn't call today could someone reach out to him tomorrow?

## 2020-08-16 ENCOUNTER — Other Ambulatory Visit: Payer: Self-pay

## 2020-08-16 ENCOUNTER — Ambulatory Visit: Payer: Medicaid Other | Admitting: Internal Medicine

## 2020-08-16 ENCOUNTER — Encounter: Payer: Self-pay | Admitting: Gastroenterology

## 2020-08-16 NOTE — Progress Notes (Signed)
Progress since last Review: On 11/22 CM connected with Cameron [name] to assess for immediate care managements needs related to Cameron [name] intervention: Cameron [name] will call in his own prescriptions, schedule, and attend medical appointments in person or over Zoom consistently.    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  CM instructed Cameron [name] to call the office and speak to a nurse for immediate symptom assessment and get scheduled, CM also sent Cameron [name] care team a message regarding his urgency. [facility] RN Cameron [name] responded to Cameron [name] ad CM to offer him an appointment with Cameron [name] for 11/23. Cameron [name] not respond to [employer] from Cameron [name] but [name] respond to CM texts facilitating communication. Cameron [name] confirmed zoom appointment with Cameron for 11/23 at 4:30pm. CM informed Cameron [name] and made note in his chart.     Cameron [name] also informed CM that he will be going to urgent care that evening for treatment. CM informed Cameron [name] of this as well. As of 11/23 it is unclear whether or not he went to urgent care. No additional immediate needs presented to CM or on-call provider over the night.     Plan to Move Forward:  CM will contact Cameron [name] again in the morning to confirm he still plans on attending his appointment. CM will update care team accordingly. CM will remain available to Cameron [name] and care team as needed.

## 2020-08-17 ENCOUNTER — Other Ambulatory Visit: Payer: Self-pay | Admitting: Primary Care

## 2020-08-17 ENCOUNTER — Encounter: Payer: Self-pay | Admitting: Primary Care

## 2020-08-17 ENCOUNTER — Other Ambulatory Visit: Payer: Self-pay

## 2020-08-17 DIAGNOSIS — D571 Sickle-cell disease without crisis: Secondary | ICD-10-CM

## 2020-08-17 MED ORDER — OXYCODONE HCL 10 MG PO TABS *I*
10.0000 mg | ORAL_TABLET | ORAL | 0 refills | Status: DC | PRN
Start: 2020-08-17 — End: 2020-08-26
  Filled 2020-08-17: qty 36, 6d supply, fill #0

## 2020-08-17 NOTE — Telephone Encounter (Signed)
Writer spoke to patient. Patient rescheduled for an in person visit with Gaylyn Cheers NP on 12/03 at 11:30 AM.

## 2020-08-17 NOTE — Telephone Encounter (Signed)
Patient Name: Cameron [name]   Birth Date: 1996-01-13   Address: [address] [place], [place] [address]   Sex: Male   Rx Written Rx Dispensed Drug Quantity Days Supply Prescriber Name Prescriber Dea # Payment Method Dispenser   08/04/2020 08/04/2020 oxycodone hcl 10 mg tablet  84 14 [name], [name] MD [id number] [facility]   07/21/2020 07/21/2020 oxycodone hcl 10 mg tablet  84 14 [name], [name] MD [id number] [facility]   07/08/2020 07/08/2020 oxycodone hcl 10 mg tablet  84 14 [name], [name] MD [id number] [facility]   06/23/2020 06/24/2020 oxycodone hcl 10 mg tablet  84 14 [name], [name] MD [id number] [facility]   06/08/2020 06/08/2020 oxycodone hcl 10 mg tablet  84 14 [name], [name] [id number] [facility]   05/24/2020 05/25/2020 oxycodone hcl 10 mg tablet  84 14 [name], [name] MD [id number] [facility]   05/11/2020 05/11/2020 oxycodone hcl 10 mg tablet  84 14 [name], [name] [id number] [facility]   04/26/2020 04/26/2020 oxycodone hcl 10 mg tablet  84 14 [name], [name] [id number] [facility]   02/27/2020 02/27/2020 oxycodone hcl 10 mg tablet  70 12 [name], [name] [id number] [facility]   01/26/2020 01/26/2020 oxycodone hcl 10 mg tablet  70 12 [place] Grice [id number] [facility]   12/29/2019 12/29/2019 oxycodone hcl 10 mg tablet  70 12 [place] Grice [id number] [facility]   11/27/2019 11/27/2019 oxycodone hcl 10 mg tablet  150 25 [place] Grice [id number] [facility]      Patient Name: Cameron [name]   Birth Date: 04-20-1996   Address: [address] [place], [place] [address]   Sex: Male   Rx Written Rx Dispensed Drug Quantity Days Supply Prescriber Name Prescriber  Dea # Payment Method Dispenser   11/02/2019 11/02/2019 oxycodone hcl 10 mg tablet  150 25 Benay Pillow MD [id number] [facility]   08/26/2019 08/28/2019 oxycodone hcl 10 mg tablet  150 25 Benay Pillow MD [id number] [facility]   Others' Prescriptions  Patient Name: Cameron [name]   Birth Date: [birth year]   Address: [address] [place], [place] [address]   Sex: Male   Rx Written Rx Dispensed Drug Quantity Days Supply Prescriber Name Prescriber Dea # Payment Method Dispenser   04/12/2020 04/12/2020 oxycodone hcl 10 mg tablet  84 14 Midge Minium [id number] [facility]   03/28/2020 03/28/2020 oxycodone hcl 10 mg tablet  [address] Jodi Geralds [id number] [facility]   03/08/2020 03/10/2020 oxycodone hcl 10 mg tablet  [address] Jodi Geralds [id number] [facility]   02/04/2020 02/08/2020 oxycodone hcl 10 mg tablet  [address] Jodi Geralds [id number] [facility]   01/12/2020 01/12/2020 oxycodone hcl 10 mg tablet  [address] Oliva Bustard [id number] Other [facility]      Patient Name: Cameron [name]   Birth Date: Sep 26, 1995   Address: [address] [place], [place] [address]   Sex: Male   Rx Written Rx Dispensed  Drug Quantity Days Supply Prescriber Name Prescriber Dea # Payment Method Dispenser   09/29/2019 09/29/2019 oxycodone hcl 10 mg tablet  [address] Jodi Geralds [id number] Other [facility]

## 2020-08-25 ENCOUNTER — Encounter: Payer: Self-pay | Admitting: Internal Medicine

## 2020-08-25 ENCOUNTER — Other Ambulatory Visit: Payer: Self-pay | Admitting: Internal Medicine

## 2020-08-25 DIAGNOSIS — D571 Sickle-cell disease without crisis: Secondary | ICD-10-CM

## 2020-08-25 NOTE — Telephone Encounter (Signed)
Confidential Drug Report  Search Terms: Ethin Drummond, [birth year]   Search Date: 08/25/2020 11:35:18 AM   Searching on behalf of: [id number] - [name]   The Drug Utilization Report below displays all of the controlled substance prescriptions, if any, that your patient has filled in the last twelve months. The information displayed on this report is compiled from pharmacy submissions to the Department, and accurately reflects the information as submitted by the pharmacies.  This report was requested by: Alvira Philips   Reference #: [id number]   Others' Prescriptions  Patient Name: Cameron [name]   Birth Date: 1996/02/15   Address: [address] [place], [place] [address]   Sex: Male   Rx Written Rx Dispensed Drug Quantity Days Supply Prescriber Name Prescriber Dea # Payment Method Dispenser   08/17/2020 08/17/2020 oxycodone hcl (ir) 10 mg tab  36 6 Jodi Geralds [id number] Other [facility]   08/04/2020 08/04/2020 oxycodone hcl 10 mg tablet  84 14 [name], [name] MD [id number] Other [facility]   07/21/2020 07/21/2020 oxycodone hcl 10 mg tablet  84 14 [name], [name] [id number] Other [facility]   07/08/2020 07/08/2020 oxycodone hcl 10 mg tablet  84 14 [name], [name] [id number] Other [facility]   06/23/2020 06/24/2020 oxycodone hcl 10 mg tablet  84 14 [name], [name] [id number] Other [facility]   06/08/2020 06/08/2020 oxycodone hcl 10 mg tablet  84 14 [name], [name] [id number] Other [facility]   05/24/2020 05/25/2020 oxycodone hcl 10 mg tablet  84 14 [name], [name] [id number] Other [facility]   05/11/2020 05/11/2020 oxycodone hcl 10 mg tablet  84 14 [name], [name] [id number] Other [facility]   04/26/2020 04/26/2020 oxycodone hcl 10 mg tablet  84 14 [name], [name] [id number] Other [facility]   04/12/2020 04/12/2020 oxycodone hcl 10 mg tablet  84 14 Midge Minium [id number] Other [facility]   03/28/2020 03/28/2020 oxycodone hcl 10 mg tablet  84 14 Jodi Geralds [id number] Other [facility]   03/08/2020 03/10/2020 oxycodone hcl 10 mg tablet  [address] Jodi Geralds [id number] [facility]   02/27/2020 02/27/2020 oxycodone hcl 10 mg tablet  70 12 [name], [name] MD [id number] Other [facility]   02/04/2020 02/08/2020 oxycodone hcl 10 mg tablet  [address] Jodi Geralds [id number] [facility]   01/26/2020 01/26/2020 oxycodone hcl 10 mg tablet  70 12 [place] Grice [id number] Other [facility]   01/12/2020 01/12/2020 oxycodone hcl 10 mg tablet  [address] Oliva Bustard [id number] Other [facility]   12/29/2019 12/29/2019 oxycodone hcl 10 mg tablet  70 12 [place] Grice [id number] Other [facility]   11/27/2019 11/27/2019 oxycodone hcl 10 mg tablet  150 25 [place] Grice [id number] Other [facility]      Patient Name: Cameron [name]   Birth Date: 05/31/1996   Address: [address] [place], [place] [address]   Sex: Male   Rx Written Rx Dispensed Drug Quantity Days Supply Prescriber Name Prescriber Dea # Payment Method Dispenser   11/02/2019 11/02/2019 oxycodone hcl 10 mg  tablet  150 25 Benay Pillow MD [id number] [facility]   09/29/2019 09/29/2019 oxycodone hcl 10 mg tablet  150 25 Jodi Geralds [id number] [facility]   08/26/2019 08/28/2019 oxycodone hcl 10 mg tablet  150 25 Benay Pillow MD [id number] Other [facility]   * - Drugs marked with an asterisk are compound drugs. If the compound drug is made up of more than one controlled substance, then each controlled substance will be a separate row in the  table.   2021 [facility]

## 2020-08-26 ENCOUNTER — Other Ambulatory Visit: Payer: Self-pay | Admitting: Internal Medicine

## 2020-08-26 ENCOUNTER — Other Ambulatory Visit: Payer: Self-pay

## 2020-08-26 ENCOUNTER — Ambulatory Visit: Payer: Medicaid Other | Attending: Internal Medicine | Admitting: Internal Medicine

## 2020-08-26 VITALS — BP 116/76 | HR 89 | Temp 97.7°F | Ht 74.0 in | Wt 185.6 lb

## 2020-08-26 DIAGNOSIS — K5190 Ulcerative colitis, unspecified, without complications: Secondary | ICD-10-CM | POA: Insufficient documentation

## 2020-08-26 DIAGNOSIS — Z23 Encounter for immunization: Secondary | ICD-10-CM | POA: Insufficient documentation

## 2020-08-26 DIAGNOSIS — M549 Dorsalgia, unspecified: Secondary | ICD-10-CM | POA: Insufficient documentation

## 2020-08-26 DIAGNOSIS — K8301 Primary sclerosing cholangitis: Secondary | ICD-10-CM | POA: Insufficient documentation

## 2020-08-26 DIAGNOSIS — D5700 Hb-SS disease with crisis, unspecified: Secondary | ICD-10-CM | POA: Insufficient documentation

## 2020-08-26 MED ORDER — OXYCODONE HCL 15 MG PO TABS *I*
15.0000 mg | ORAL_TABLET | ORAL | 0 refills | Status: DC | PRN
Start: 2020-08-26 — End: 2020-09-08
  Filled 2020-08-26: qty 42, 7d supply, fill #0

## 2020-08-26 NOTE — Patient Instructions (Addendum)
Increase your pain medicine to 15mg  every 4 hours as needed- reduce back down to 10mg  when you are feeling better  We will plan for an appointment for IVF and IV pain medicine here in our office hopefully in the next week         Follow up 1 week (video is fine for this) to reassess pain after you infusion appointment.

## 2020-08-26 NOTE — Progress Notes (Signed)
[facility]  SICKLE CELL - ACUTE VISIT      CHIEF COMPLAINT   Increase in pain     Subjective     SUBJECTIVE       Sickle Cell vaso-occlusive crisis  - Symptoms began a few weeks ago  - Location of pain: back and flank- right sided pain is a dull/achey pain. He is not able tot ell me if this pain is the same as his sickle cell pain   - Inciting events: weather likely increased pain- "came out of nowhere"  - Interventions at home: laying down and relaxing     Current home management:  - Non-pharmacologic interventions:heat, rest, hot showers and oral hydration - dirnking a lot of water   - Non-opioid pain modifying medications: tylenol   No ibuprofen d/t renal issues.   Tries to use the flexeril occasionally at night but it makes him sleepy- it does help with his back pain occasionally   - Short acting Opioid pain medications:oxycodone  10 mg Q4hrs PRN   - Long acting Opioid pain medications: none   Denies fevers, SOB, chest pain, trouble breathing   Denies any nausea, vomiting, any issues with eating, pain is not worse after meals, etc.   GI is good- denies any constipation, diarrhea, abdominal pain, blood in stool.       Medications Reviewed and changes     Current Outpatient Medications:     mesalamine (ASACOL HD) 800 mg tablet, Take 3 tablets (2,400 mg total) by mouth 2 times daily, Disp: 180 tablet, Rfl: 2    ferrous sulfate 325 (65 FE) MG tablet, Take 1 tablet (325 mg total) by mouth 2 times daily (with meals), Disp: 100 tablet, Rfl: 3    oxyCODONE (ROXICODONE) 15 MG immediate release tablet, Take 1 tablet (15 mg total) by mouth every 4 hours as needed for Pain  Max daily dose: 6 tablets, Disp: 42 tablet, Rfl: 0    cyclobenzaprine (FLEXERIL) 5 MG tablet, Take 1 tablet (5 mg total) by mouth 3 times daily as needed for Muscle spasms, Disp: 60 tablet, Rfl: 0    SUMAtriptan (IMITREX) 50 MG tablet, Take 1 tablet (50 mg total) by mouth as needed for Migraine Take at onset of headache. May  repeat once in 2 hours., Disp: 2 tablet, Rfl: 0    diclofenac (VOLTAREN) 1 % gel, Apply 4 g to affected area 4 times daily., Disp: 100 g, Rfl: 2    acetaminophen (TYLENOL) 500 mg tablet, Take 500 mg by mouth every 6 hours as needed, Disp: , Rfl:     naloxone (NARCAN) 4 mg/0.1 mL nasal spray, Instill 1 spray in 1 nostril once for opioid reversal. Repeat in alternating nostrils with new package every 2-3 minutes until response (Patient not taking: Reported on 03/21/2020), Disp: 2 each, Rfl: 5  Review of Systems   All other systems reviewed and are negative.         Objective   OBJECTIVE   BP 116/76    Pulse 89    Temp 36.5 C (97.7 F) (Temporal)    Ht 1.88 m (6\' 2" )    Wt 84.2 kg (185 lb 9.6 oz)    SpO2 93%    BMI 23.83 kg/m   Physical Exam  Vitals reviewed.   Constitutional:       General: He is not in acute distress.     Appearance: Normal appearance. He is well-developed. He is not ill-appearing, toxic-appearing or  diaphoretic.   HENT:      Head: Normocephalic and atraumatic.      Right Ear: Hearing normal.      Left Ear: Hearing normal.      Nose: Nose normal.      Mouth/Throat:      Pharynx: Uvula midline.   Eyes:      General: Lids are normal. Scleral icterus present.      Conjunctiva/sclera: Conjunctivae normal.      Pupils: Pupils are equal, round, and reactive to light.   Neck:      Trachea: Trachea normal.   Cardiovascular:      Rate and Rhythm: Normal rate and regular rhythm.      Pulses: Normal pulses.      Heart sounds: Normal heart sounds, S1 normal and S2 normal. No murmur heard.      Pulmonary:      Effort: Pulmonary effort is normal. No accessory muscle usage or respiratory distress.      Breath sounds: Normal breath sounds. No decreased breath sounds, wheezing or rhonchi.   Abdominal:      General: Abdomen is flat. Bowel sounds are normal.      Palpations: Abdomen is soft.      Tenderness: There is no abdominal tenderness. There is no guarding.   Musculoskeletal:         General: Normal range of  motion.      Cervical back: Normal range of motion and neck supple.   Lymphadenopathy:      Cervical: No cervical adenopathy.   Skin:     General: Skin is warm and dry.      Coloration: Skin is not pale.      Findings: No rash.      Nails: There is no clubbing.   Neurological:      Mental Status: He is alert and oriented to person, place, and time.      Gait: Gait normal.   Psychiatric:         Mood and Affect: Mood normal.         Speech: Speech normal.         Behavior: Behavior normal.             Lab results: 03/15/20  0110   WBC 8.7   Hemoglobin 7.2*   Hematocrit 25*   RBC 4.0*   Platelets 377*          ASSESSMENT / DIAGNOSIS   Cameron [name] was seen today for follow-up.    Diagnoses and all orders for this visit:    Sickle cell crisis  Increase oxycodone IR to 15mg  Q4hrs PRN  [facility] appointment next Thursday- follow up via video after   Increase water intake     Back pain  Plan as above +flexeril 10mg  TID PRN    Ulcerative colitis  Stable  Continue disease modifying medications     Immunization due  -     Covid-19 mRNA vaccine (PFIZER) IM 30 mcg/0.3 mL    Primary sclerosing cholangitis  C/o right flank/side pain- likely related to sickle cell crisis however with this diagnosis and new sclera icterus- I would like to assess liver function and avoid tylenol. Less likely UC or GI related.   -     Comprehensive metabolic panel; Future    Other orders  -     oxyCODONE (ROXICODONE) 15 MG immediate release tablet; Take 1 tablet (15 mg total) by mouth every 4 hours as needed for Pain  Max daily dose: 6 tablets        No follow-ups on file.  -Patient instructed to call if symptoms are not improving or worsening    Electronically signed by Signed: Jodi Geralds, NP on 08/26/2020   [facility]

## 2020-08-26 NOTE — Progress Notes (Signed)
Patient seen for [employer] vaccination.   Completed in left deltoid. Tolerated well.  Lot #: [id number]  Exp date: 02/2021  VIS provided.   Verbal and paper consent obtained.  Patient monitored for 15 minutes after administration, no adverse reactions.     Leonard Schwartz, RN

## 2020-09-01 ENCOUNTER — Other Ambulatory Visit: Payer: Self-pay

## 2020-09-01 ENCOUNTER — Telehealth: Payer: Self-pay | Admitting: Primary Care

## 2020-09-01 ENCOUNTER — Ambulatory Visit: Payer: Medicaid Other

## 2020-09-01 ENCOUNTER — Encounter: Payer: Self-pay | Admitting: Gastroenterology

## 2020-09-01 NOTE — Telephone Encounter (Signed)
Called and l/m for pt   Writer wanting to discuss today's infusion appointment  Requested a call back to the office  Number provided

## 2020-09-01 NOTE — Progress Notes (Signed)
Progress since last Review: Steele will notify CM of any immediate barriers that may cause him to miss an appointment or prevent him from having medications he will need.    Current Status: On 12/9 CM was contacted by [facility] RN Sarah to notify Dewie of appointment time change and assess for barriers to attending appointment at the new time. CM spoke to Stonewall on the phone and he confirmed ability to accommodate time change and confirmed he will be able to attend new appointment this afternoon. Several hours later Gianmarco text CM prior to his appointment requesting CM facilitate communication with providers to notify [facility] nursing staff that he will miss his appointment due to his car having a flat time. There was not enough time to book Medicaid transportation for a ride so close to his appointment time.  CM told [facility] RN Maralyn Sago and she requested CM assistance with contacting him to propose a new appointment for tomorrow afternoon due to her inability to contact him by phone. CM called Nyxon again and he agreed to new appointment date and time. CM assessed for transportation barriers and he confirmed having capacity to have his tire fixed today and no potential barriers to appointment attendance. CM followed up with RN Maralyn Sago to confirm appointment with care team. No immediate care management needs assessed at this time.    Plan to Move Forward: CM will attempt to text Taran prior to appointment if time allows. CM will remain available to [facility] and care team as needed.

## 2020-09-02 ENCOUNTER — Encounter: Payer: Self-pay | Admitting: Oncology

## 2020-09-02 ENCOUNTER — Ambulatory Visit
Admission: AD | Admit: 2020-09-02 | Discharge: 2020-09-02 | Disposition: A | Discharge status: Home / Self Care | Payer: Medicaid Other | Source: Ambulatory Visit | Attending: Urology | Admitting: Urology

## 2020-09-02 ENCOUNTER — Ambulatory Visit: Payer: Medicaid Other

## 2020-09-02 DIAGNOSIS — J069 Acute upper respiratory infection, unspecified: Secondary | ICD-10-CM | POA: Insufficient documentation

## 2020-09-02 DIAGNOSIS — Z20828 Contact with and (suspected) exposure to other viral communicable diseases: Secondary | ICD-10-CM | POA: Insufficient documentation

## 2020-09-02 DIAGNOSIS — Z20822 Contact with and (suspected) exposure to covid-19: Secondary | ICD-10-CM | POA: Insufficient documentation

## 2020-09-02 LAB — COVID-19 PCR

## 2020-09-02 LAB — COVID-19 NAAT (PCR): COVID-19 NAAT (PCR): NEGATIVE

## 2020-09-02 NOTE — Telephone Encounter (Signed)
Called patient back.   Booked infusion appt for Tuesday 12/14 as long as patient is COVID negative.     Leonard Schwartz, RN

## 2020-09-02 NOTE — Telephone Encounter (Signed)
Called and spoke to patient.   States that he currently has a runny nose, cough, sore throat. No SOB.   Started last night.   No known exposures. Has had 1 dose of Pfizer- 12/3.  Offered patient options to be tested- [facility], [facility], urgent care. Patient states he will go to urgent care to be tested.   Infusion appt cancelled for today 12/10. Patient will call to reschedule when he gets his results and is asymptomatic.    Leonard Schwartz, RN

## 2020-09-02 NOTE — Discharge Instructions (Signed)
Rest and drink plenty of fluids.  Run a humidifier in your bedroom at night  Wash hands frequently to prevent the spread of infection.    Motrin or tylenol every 6 hours as needed for pain.    Mucinex can help to break up mucous and may be easier to cough it up. You can take this every 12 hours for the next 3-5 days.     Delsym OTC as needed for cough.    Warm salt water gargles and hot tea for a sore throat.  Throat lozengers as needed.    Flonase nasal spray is helpful for nasal congestion and post nasal drip     Saline nasal spray every 4 hours for the next 2-5 days. Netti Pot Sinus rinse can be helpful. Use up to twice daily- once in the morning and once before bed to help with nasal congestion.    Shower steam can be helpful to loosen any mucous and relief congestion. You can try this before bed, or when you get up in the morning.    Elevate your head at night with an extra pillow to help with congestion/reducing cough.    Please seek medical attention if you develop worsening sore throat, fevers, shortness of breath or difficulty breathing.      You were tested for COVID today at your visit. We will call you with any positive or negative results when they result    What is Coronavirus disease 2019 (COVID-19)?  Coronaviruses are a large family of viruses. Some cause illness in people, and others, such as canine and feline coronaviruses, only infect animals.  Most coronaviruses in humans are known to cause what we often refer to as the “common cold.”  Rarely, coronaviruses can be come much more serious and have caused more serious infections such as Middle East Respiratory Syndrome (MERS) and Severe Acute Respiratory Syndrome (SARS).  COVID-19 appears to be more serious than the routine coronaviruses that cause colds, but not as severe as MERS or SARS.    What should I do to keep my infection from spreading to my family and other people in the community?  Stay home except to get medical care  You should restrict  activities outside your home, except for getting medical care. Do not go to work, school, or public areas. Avoid  using public transportation, ride-sharing, or taxis.        If you are NOT vaccinated and you have had close exposure ( greater than 15 mins wihtout a mask on, or less than 6 feet away without a mask on) with a COVID-19 positive individual you must  quarantine for 10 days, you may come off of quarantine on the seventh day if you test negative.  Should continue to monitor symptoms for full 14 days.    If you have fever, cough or shortness of breath and have not been around anyone who has been diagnosed with you must remain in quarantine until you receive your results.     If you result negative for COVID-19 and have NOT had direct COVID-19 exposure you can be released from quarantine.     If you are vaccinated and your  result is negative for COVID-19 and you have had close exposure to someone positive for COVID-19  you should get tested 3-5 days after your exposure, even if you don’t have symptoms. You should also wear a mask indoors in public for 14 days following exposure or until your test result is negative.   You should isolate for 10 days if your test result is positive.    If you test positive, remain in isolation for at least 10 days (may be longer if you continue to have fevers).  You can return to work after 10 days of isolation from the onset of symptoms if  it has been at least 72 hours after your symptoms and fever have resolved without using fever-reducing medications.     If your symptoms are worsening (such as developing shortness of breath, chest pain, or confusion) please call ahead to your local emergency room to let them know you are coming so that they can prepare, or let the EMS crew know on their arrival that you have COVID-19 so that they can wear appropriate protection.  Remain isolated from others in your home, cover any coughs/sneezes, do not share common household items (such as  cups and silverware) and wash hands frequently.

## 2020-09-02 NOTE — UC Provider Note (Signed)
Chief Complaint:   Chief Complaint   Patient presents with    COVID-19 Concern        HPI:  24 y.o. male w hx as noted in chart presents to Urgent Care with symptoms concerning for COVID 19. Patient complains of 1 day(s) of the following symptoms: Sneeze, cough, congestion and mild sore throat  Symptoms have remained the same     Known sick contacts / COVID 19 exposures: no  Recent travel: no  COVID 19 vaccination completed >2 weeks ago: no has received 1 dose    VITALS:  BP 137/74    Pulse 95    Temp 36.6 C (97.9 F)    Resp 16    SpO2 100%      ROS: see above / below, otherwise complete ROS negative   Fever: no  Body aches: no  Upper respiratory congestion: yes  Shortness of breath: no  Cough: yes     PHYSICAL EXAM:  Appearance: Well appearing, no acute distress   Nose: normal   Throat: normal  Neck: no cervical LAD or tenderness, soft  CV: RRR, no murmurs   Pulm: Lungs Clear to ausculation bilaterally, no acute respiratory distress     UC LABS:  Labs Reviewed   COVID-19 PCR       No results found for this visit on 09/02/20.     MDM:  24 y.o. male w hx as noted in chart presents with symptoms concerning for COVID-19 vs other mild viral illness. COVID 19 PCR testing ordered (see above if other labs ordered). The patient was informed the COVID 19 result will be available in MyChart and the need for isolation per CDC guidelines. Recommend supportive care at this time. Advised close follow up w PCP as needed. Appropriate Handouts provided.     Given the patient's reassuring vital signs and overall well appearance, the patient can be managed safely at home. Hydration advised. Patient advised to go to ED for any worsening or concerning symptoms.        Annett Gula, NP  09/02/20 1327

## 2020-09-02 NOTE — ED Triage Notes (Signed)
Patient coming in with COVID concern - symptoms started yesterday with sneezing, cough, congestion and sore throat.

## 2020-09-04 ENCOUNTER — Other Ambulatory Visit: Payer: Self-pay

## 2020-09-05 ENCOUNTER — Encounter: Payer: Self-pay | Admitting: Oncology

## 2020-09-06 ENCOUNTER — Ambulatory Visit: Payer: Medicaid Other | Attending: Internal Medicine

## 2020-09-06 VITALS — BP 119/69 | HR 84 | Temp 99.0°F

## 2020-09-06 DIAGNOSIS — E611 Iron deficiency: Secondary | ICD-10-CM | POA: Insufficient documentation

## 2020-09-06 DIAGNOSIS — K5100 Ulcerative (chronic) pancolitis without complications: Secondary | ICD-10-CM | POA: Insufficient documentation

## 2020-09-06 LAB — LACTATE DEHYDROGENASE: LD: 641 U/L — ABNORMAL HIGH (ref 118–225)

## 2020-09-06 LAB — CBC AND DIFFERENTIAL
Baso # K/uL: 0.1 10*3/uL (ref 0.0–0.1)
Basophil %: 0.7 %
Eos # K/uL: 0.2 10*3/uL (ref 0.0–0.5)
Eosinophil %: 3.3 %
Hematocrit: 27 % — ABNORMAL LOW (ref 40–51)
Hemoglobin: 7.7 g/dL — ABNORMAL LOW (ref 13.7–17.5)
IMM Granulocytes #: 0 10*3/uL (ref 0.0–0.0)
IMM Granulocytes: 0.1 %
Lymph # K/uL: 1.9 10*3/uL (ref 1.3–3.6)
Lymphocyte %: 26.3 %
MCH: 19 pg — ABNORMAL LOW (ref 26–32)
MCHC: 29 g/dL — ABNORMAL LOW (ref 32–37)
MCV: 66 fL — ABNORMAL LOW (ref 79–92)
Mono # K/uL: 0.6 10*3/uL (ref 0.3–0.8)
Monocyte %: 8.4 %
Neut # K/uL: 4.5 10*3/uL (ref 1.8–5.4)
Nucl RBC # K/uL: 0 10*3/uL (ref 0.0–0.0)
Nucl RBC %: 0 /100 WBC (ref 0.0–0.2)
Platelets: 458 10*3/uL — ABNORMAL HIGH (ref 150–330)
RBC: 4 MIL/uL — ABNORMAL LOW (ref 4.6–6.1)
RDW: 24 % — ABNORMAL HIGH (ref 11.6–14.4)
Seg Neut %: 61.2 %
WBC: 7.3 10*3/uL (ref 4.2–9.1)

## 2020-09-06 LAB — COMPREHENSIVE METABOLIC PANEL
ALT: 173 U/L — ABNORMAL HIGH (ref 0–50)
AST: 130 U/L — ABNORMAL HIGH (ref 0–50)
Albumin: 3.8 g/dL (ref 3.5–5.2)
Alk Phos: 1014 U/L — ABNORMAL HIGH (ref 40–130)
Anion Gap: 12 (ref 7–16)
Bilirubin,Total: 4.6 mg/dL — ABNORMAL HIGH (ref 0.0–1.2)
CO2: 23 mmol/L (ref 20–28)
Calcium: 9 mg/dL — ABNORMAL LOW (ref 9.3–10.5)
Chloride: 104 mmol/L (ref 96–108)
Creatinine: 0.66 mg/dL — ABNORMAL LOW (ref 0.67–1.17)
GFR,Black: 157 *
GFR,Caucasian: 136 *
Glucose: 76 mg/dL (ref 60–99)
Lab: 6 mg/dL (ref 6–20)
Potassium: 4.1 mmol/L (ref 3.3–5.1)
Sodium: 139 mmol/L (ref 133–145)
Total Protein: 8.1 g/dL — ABNORMAL HIGH (ref 6.3–7.7)

## 2020-09-06 LAB — RBC MORPHOLOGY

## 2020-09-06 LAB — RETICULOCYTES
Retic %: 3.7 % — ABNORMAL HIGH (ref 0.7–2.3)
Retic Abs: 148.7 10*3/uL — ABNORMAL HIGH (ref 33.8–124.0)

## 2020-09-06 LAB — NEUTROPHIL #-INSTRUMENT: Neutrophil #-Instrument: 4.5 10*3/uL

## 2020-09-06 MED ORDER — SODIUM CHLORIDE 0.9 % INJ (FLUSH) WRAPPED *I*
5.0000 mL | Status: DC | PRN
Start: 2020-09-06 — End: 2020-09-06

## 2020-09-06 MED ORDER — HYDROMORPHONE HCL PF 1 MG/ML IJ SOLN *WRAPPED*
2.0000 mg | INTRAMUSCULAR | Status: DC | PRN
Start: 2020-09-06 — End: 2020-09-06

## 2020-09-06 MED ORDER — SODIUM CHLORIDE 0.9 % IV SOLN WRAPPED *I*
2000.0000 mL | Freq: Once | Status: AC
Start: 2020-09-06 — End: 2020-09-06
  Administered 2020-09-06: 1000 mL via INTRAVENOUS

## 2020-09-06 MED ORDER — ONDANSETRON HCL 2 MG/ML IV SOLN *I*
4.0000 mg | Freq: Once | INTRAMUSCULAR | Status: DC | PRN
Start: 2020-09-06 — End: 2020-09-06

## 2020-09-06 MED ORDER — DIPHENHYDRAMINE HCL 50 MG/ML IJ SOLN *I*
25.0000 mg | Freq: Once | INTRAMUSCULAR | Status: DC | PRN
Start: 2020-09-06 — End: 2020-09-06

## 2020-09-06 NOTE — Progress Notes (Signed)
Pt seen in [facility] for IVF and pain meds.  Pt reporting 0/10 pain; declined all pain medications.  Has court in [place] at 2pm today.  1L IVF given into 22g PIV in right AC.  IV removed.  Site benign; bnad-aid applied.  FUV booked with Barkley Bruns, NP.  Pt given out of work note until that appt.  No further issues.    Blossom Hoops, RN

## 2020-09-06 NOTE — Patient Instructions (Signed)
Appt booked 12/16 8:30a in-person with Barkley Bruns

## 2020-09-08 ENCOUNTER — Other Ambulatory Visit: Payer: Self-pay

## 2020-09-08 ENCOUNTER — Ambulatory Visit: Payer: Medicaid Other | Admitting: Internal Medicine

## 2020-09-08 VITALS — BP 122/68 | HR 83 | Temp 96.8°F | Ht 75.0 in | Wt 184.7 lb

## 2020-09-08 DIAGNOSIS — D5700 Hb-SS disease with crisis, unspecified: Secondary | ICD-10-CM

## 2020-09-08 DIAGNOSIS — K8301 Primary sclerosing cholangitis: Secondary | ICD-10-CM

## 2020-09-08 MED ORDER — OXYCODONE HCL 10 MG PO TABS *I*
10.0000 mg | ORAL_TABLET | ORAL | 0 refills | Status: DC | PRN
Start: 2020-09-08 — End: 2020-09-20
  Filled 2020-09-08: qty 84, 14d supply, fill #0

## 2020-09-08 NOTE — Progress Notes (Signed)
[facility]  SICKLE CELL - ACUTE VISIT      CHIEF COMPLAINT   Follow up pain     Subjective     SUBJECTIVE   History of HgSS     Seen two weeks ago for acute pain crisis- pain medications were increased to 15mg  Q4hrs  And he received an infusion center appointment for fluids   Sickle Cell vaso-occlusive crisis  - Location of pain: lower back   - Inciting events: unknwon  - Interventions at home: pain meds  deneis SOB, cough, fevers  Is getting over a mild cold- test for this came back negative the last time he checked.     Current home management:  - Non-pharmacologic interventions:heat, rest and hot showers  - Non-opioid pain modifying medications: tylenol occasionally- doesn't take ibuprofen    - Short acting Opioid pain medications:oxycodone 15mg  Q4hrs pRN- prefers to go back down to 10mg  because the 15 made him too tired   - Long acting Opioid pain medications: none   Denies abdominal pain0 BMs are normal and denies blood/diarrhea     Current disease modifying therapy: Exchange Transfusion  Medications Reviewed and changes     Current Outpatient Medications:     mesalamine (ASACOL HD) 800 mg tablet, Take 3 tablets (2,400 mg total) by mouth 2 times daily, Disp: 180 tablet, Rfl: 2    diclofenac (VOLTAREN) 1 % gel, Apply 4 g to affected area 4 times daily., Disp: 100 g, Rfl: 2    ferrous sulfate 325 (65 FE) MG tablet, Take 1 tablet (325 mg total) by mouth 2 times daily (with meals), Disp: 100 tablet, Rfl: 3    oxyCODONE (ROXICODONE) 10 mg immediate release tablet, Take 1 tablet (10 mg total) by mouth every 4 hours as needed for Pain  Max daily dose: 60 mg, Disp: 84 tablet, Rfl: 0    cyclobenzaprine (FLEXERIL) 5 MG tablet, Take 1 tablet (5 mg total) by mouth 3 times daily as needed for Muscle spasms, Disp: 60 tablet, Rfl: 0    SUMAtriptan (IMITREX) 50 MG tablet, Take 1 tablet (50 mg total) by mouth as needed for Migraine Take at onset of headache. May repeat once in 2 hours., Disp: 2  tablet, Rfl: 0    naloxone (NARCAN) 4 mg/0.1 mL nasal spray, Instill 1 spray in 1 nostril once for opioid reversal. Repeat in alternating nostrils with new package every 2-3 minutes until response (Patient not taking: Reported on 03/21/2020), Disp: 2 each, Rfl: 5  Review of Systems   All other systems reviewed and are negative.         Objective   OBJECTIVE   BP 122/68    Pulse 83    Temp 36 C (96.8 F) (Temporal)    Ht 1.905 m (6\' 3" )    Wt 83.8 kg (184 lb 11.2 oz)    SpO2 100%    BMI 23.09 kg/m   Physical Exam  Vitals reviewed.   Constitutional:       General: He is not in acute distress.     Appearance: Normal appearance. He is well-developed. He is not ill-appearing, toxic-appearing or diaphoretic.   HENT:      Head: Normocephalic and atraumatic.      Right Ear: Hearing normal.      Left Ear: Hearing normal.      Nose: Nose normal.      Mouth/Throat:      Pharynx: Uvula midline.   Eyes:  General: Lids are normal. Scleral icterus present.      Conjunctiva/sclera: Conjunctivae normal.      Pupils: Pupils are equal, round, and reactive to light.   Neck:      Trachea: Trachea normal.   Cardiovascular:      Rate and Rhythm: Normal rate and regular rhythm.      Pulses: Normal pulses.      Heart sounds: Normal heart sounds, S1 normal and S2 normal. No murmur heard.      Pulmonary:      Effort: Pulmonary effort is normal. No accessory muscle usage or respiratory distress.      Breath sounds: Normal breath sounds. No decreased breath sounds, wheezing or rhonchi.   Abdominal:      General: Bowel sounds are normal.      Palpations: Abdomen is soft.      Tenderness: There is no abdominal tenderness.   Musculoskeletal:         General: Normal range of motion.      Cervical back: Normal range of motion and neck supple.   Lymphadenopathy:      Cervical: No cervical adenopathy.   Skin:     General: Skin is warm and dry.      Coloration: Skin is not pale.      Findings: No rash.      Nails: There is no clubbing.    Neurological:      Mental Status: He is alert and oriented to person, place, and time.      Gait: Gait normal.   Psychiatric:         Speech: Speech normal.             Lab results: 09/06/20  0855   WBC 7.3   Hemoglobin 7.7*   Hematocrit 27*   RBC 4.0*   Platelets 458*          ASSESSMENT / DIAGNOSIS   Prakash was seen today for follow-up.    Diagnoses and all orders for this visit:    Sickle cell crisis  Improved s/p increase in pain medications and infusion appointment for fluids   -today he is feeling improved and wanted to decrease back down to his 10mg  dose of oxycodone- reports that the 15mg  made him too tired.   Symptoms management reviewed: good hydration by drinking lots of water, warm pads/warm baths, rest, stay out of cold weather, reduce stress/triggers    Primary sclerosing cholangitis  -educated on avoiding use of tylenol for pain control  -Diesel also needs to use caution with ibuprofen d/t UC.     Other orders  -     oxyCODONE (ROXICODONE) 10 mg immediate release tablet; Take 1 tablet (10 mg total) by mouth every 4 hours as needed for Pain  Max daily dose: 60 mg        No follow-ups on file.  -Patient instructed to call if symptoms are not improving or worsening    Electronically signed by Signed: , NP on 09/08/2020   [facility]

## 2020-09-20 ENCOUNTER — Other Ambulatory Visit: Payer: Self-pay

## 2020-09-20 ENCOUNTER — Other Ambulatory Visit: Payer: Self-pay | Admitting: Internal Medicine

## 2020-09-20 MED ORDER — CYCLOBENZAPRINE HCL 5 MG PO TABS *I*
5.0000 mg | ORAL_TABLET | Freq: Three times a day (TID) | ORAL | 0 refills | Status: DC | PRN
Start: 2020-09-20 — End: 2021-02-05
  Filled 2020-09-20: qty 60, 20d supply, fill #0

## 2020-09-20 MED ORDER — OXYCODONE HCL 10 MG PO TABS *I*
10.0000 mg | ORAL_TABLET | ORAL | 0 refills | Status: DC | PRN
Start: 2020-09-20 — End: 2020-10-02
  Filled 2020-09-20: qty 84, 14d supply, fill #0

## 2020-09-20 NOTE — Telephone Encounter (Signed)
Patient Name: Cameron [name]   Birth Date: Jan 31, 1996   Address: [address] [place], [place] [address]   Sex: Male   Rx Written Rx Dispensed Drug Quantity Days Supply Prescriber Name Prescriber Dea # Payment Method Dispenser   09/08/2020 09/08/2020 oxycodone hcl (ir) 10 mg tab  [address] Jodi Geralds [id number] Other [facility]   08/26/2020 08/26/2020 oxycodone hcl (ir) 15 mg tab  42 7 Jodi Geralds [id number] Other [facility]   08/17/2020 08/17/2020 oxycodone hcl (ir) 10 mg tab  36 6 Jodi Geralds [id number] Other [facility]   08/04/2020 08/04/2020 oxycodone hcl 10 mg tablet  84 14 [name], [name] MD [id number] Other [facility]   07/21/2020 07/21/2020 oxycodone hcl 10 mg tablet  84 14 [name], [name] [id number] Other [facility]   07/08/2020 07/08/2020 oxycodone hcl 10 mg tablet  84 14 [name], [name] [id number] Other [facility]   06/23/2020 06/24/2020 oxycodone hcl 10 mg tablet  84 14 [name], [name] MD [id number] Other [facility]   06/08/2020 06/08/2020 oxycodone hcl 10 mg tablet  84 14 [name], [name] [id number] Other [facility]   05/24/2020 05/25/2020 oxycodone hcl 10 mg tablet  84 14 [name], [name] [id number] Other [facility]   05/11/2020 05/11/2020 oxycodone hcl 10 mg tablet  84 14 [name], [name] [id number] Other [facility]   04/26/2020 04/26/2020 oxycodone hcl 10 mg tablet  84 14 [name], [name] [id number] Other [facility]   04/12/2020 04/12/2020 oxycodone hcl 10 mg tablet  84 14 Midge Minium [id number] Other [facility]   03/28/2020 03/28/2020 oxycodone hcl 10 mg tablet  84 14 Jodi Geralds [id number] Other [facility]   03/08/2020 03/10/2020 oxycodone hcl 10 mg tablet  [address] Jodi Geralds [id number] Other [facility]   02/27/2020 02/27/2020 oxycodone hcl 10 mg tablet  70 12 [name], [name] MD [id number] Other [facility]   02/04/2020 02/08/2020 oxycodone hcl 10 mg tablet  [address] Jodi Geralds [id number] Other [facility]   01/26/2020 01/26/2020 oxycodone hcl 10 mg tablet  70 12 [place] Grice [id number] Other [facility]   01/12/2020 01/12/2020 oxycodone hcl 10 mg tablet  [address] Oliva Bustard [id number] Other [facility]   12/29/2019 12/29/2019 oxycodone hcl 10 mg tablet  70 12 [place] Grice [id number] Other [facility]   11/27/2019 11/27/2019 oxycodone hcl (ir) 10 mg tab  150 25 [place] Grice [id number] Other [facility]      Patient Name: Cameron [name]   Birth Date: 1996-09-23   Address: [address] [place], [place] [address]   Sex: Male   Rx Written Rx Dispensed Drug Quantity Days Supply Prescriber Name Prescriber Dea # Payment Method Dispenser   11/02/2019 11/02/2019 oxycodone hcl 10 mg tablet  150 25 Benay Pillow MD [id number] Other [facility]   09/29/2019 09/29/2019 oxycodone hcl 10 mg tablet  [address] Jodi Geralds [id number] Other [facility]   * - Drugs marked with an asterisk are compound  drugs. If the compound drug is made up of more than one controlled substance, then each controlled substance will be a separate row in the table.

## 2020-09-21 ENCOUNTER — Encounter: Payer: Self-pay | Admitting: Emergency Medicine

## 2020-09-21 ENCOUNTER — Ambulatory Visit
Admission: AD | Admit: 2020-09-21 | Discharge: 2020-09-21 | Disposition: A | Discharge status: Home / Self Care | Payer: Medicaid Other | Source: Ambulatory Visit | Attending: Emergency Medicine | Admitting: Emergency Medicine

## 2020-09-21 DIAGNOSIS — Z20822 Contact with and (suspected) exposure to covid-19: Secondary | ICD-10-CM | POA: Insufficient documentation

## 2020-09-21 DIAGNOSIS — Z20828 Contact with and (suspected) exposure to other viral communicable diseases: Secondary | ICD-10-CM | POA: Insufficient documentation

## 2020-09-21 DIAGNOSIS — J029 Acute pharyngitis, unspecified: Secondary | ICD-10-CM

## 2020-09-21 LAB — COVID-19 NAAT (PCR): COVID-19 NAAT (PCR): NEGATIVE

## 2020-09-21 LAB — COVID-19 PCR

## 2020-09-21 NOTE — UC Provider Note (Signed)
Chief Complaint:   Chief Complaint   Patient presents with    COVID-19 Concern        HPI:  24 y.o. male w hx as noted in chart presents to Urgent Care with symptoms concerning for COVID 19. Patient complains of 1 day(s) of the following symptoms: sore throat and congestion    Symptoms have remained the same     Known sick contacts / COVID 19 exposures: yes  Recent travel: no  COVID 19 vaccination completed >2 weeks ago: no    VITALS:  BP 123/68 (BP Location: Right arm)    Pulse 79    Temp 37 C (98.6 F) (Temporal)    SpO2 100%      ROS: see above / below, otherwise complete ROS negative   Fever: no  Body aches: no  Upper respiratory congestion: yes  Shortness of breath: no  Cough: no     PHYSICAL EXAM:  Appearance: Well appearing, no acute distress   Nose: normal   Throat: normal  Neck: no cervical LAD or tenderness, soft  CV: RRR, no murmurs   Pulm: Lungs Clear to ausculation bilaterally, no acute respiratory distress     UC LABS:  Labs Reviewed   COVID-19 PCR       No results found for this visit on 09/21/20.     MDM:  24 y.o. male w hx as noted in chart presents with symptoms concerning for COVID-19 vs other mild viral illness. COVID 19 PCR testing ordered (see above if other labs ordered). The patient was informed the COVID 19 result will be available in MyChart and the need for isolation per CDC guidelines. Recommend supportive care at this time. Advised close follow up w PCP as needed. Appropriate Handouts provided.     Given the patient's reassuring vital signs and overall well appearance, the patient can be managed safely at home. Hydration advised. Patient advised to go to ED for any worsening or concerning symptoms.        [facility], [place]  09/21/20 1037

## 2020-09-21 NOTE — Discharge Instructions (Addendum)
Covid results are taking between 1-3 days, please quarantine until you know you have a negative covid test, all results will go to my chart we will still call positive cases    In the event that you are positive, you need to remain in quarantine for 10 days from the onset of your symptoms, remained fever free for 24 hours without any medications on board with improving symptoms.    In the event that you tested negative, you still need to remain in quarantine for 10 days as you have had a Covid exposure and you are unvaccinated, I recommend canceling your booster tomorrow.    Please call your primary care physician or message them via MyChart in the event that you are Covid positive.

## 2020-09-21 NOTE — ED Triage Notes (Signed)
Patient exposed to COVID positive individual for the last two days. Patient presents with cough and sore throat.     Current state of emergency related to COVID-19. Documentation completed per hospital emergency response standard.

## 2020-09-22 ENCOUNTER — Ambulatory Visit: Payer: Medicaid Other

## 2020-09-23 ENCOUNTER — Encounter: Payer: Self-pay | Admitting: Internal Medicine

## 2020-09-23 DIAGNOSIS — Z20822 Contact with and (suspected) exposure to covid-19: Secondary | ICD-10-CM

## 2020-09-23 DIAGNOSIS — U071 COVID-19: Secondary | ICD-10-CM

## 2020-09-26 ENCOUNTER — Ambulatory Visit: Payer: Medicaid Other | Attending: Internal Medicine

## 2020-09-26 DIAGNOSIS — Z20822 Contact with and (suspected) exposure to covid-19: Secondary | ICD-10-CM | POA: Insufficient documentation

## 2020-09-26 DIAGNOSIS — U071 COVID-19: Secondary | ICD-10-CM | POA: Insufficient documentation

## 2020-09-26 NOTE — Telephone Encounter (Signed)
Ordered coovid testing at [facility]  Will communicate with pt via mychart

## 2020-09-27 ENCOUNTER — Telehealth: Payer: Self-pay

## 2020-09-27 ENCOUNTER — Inpatient Hospital Stay: Admit: 2020-09-27 | Payer: Medicaid Other | Admitting: Student in an Organized Health Care Education/Training Program

## 2020-09-27 LAB — COVID-19 NAAT (PCR): COVID-19 NAAT (PCR): POSITIVE — AB

## 2020-09-27 LAB — COVID-19 PCR

## 2020-09-27 NOTE — Telephone Encounter (Signed)
Patient no-showed 09/27/20 fuv appointment with Bernadette Hoit, PA. Letter was sent to patient

## 2020-09-28 ENCOUNTER — Encounter: Payer: Self-pay | Admitting: Oncology

## 2020-09-28 NOTE — Telephone Encounter (Signed)
This is being handled in another task.

## 2020-09-28 NOTE — Telephone Encounter (Signed)
Called to inform pt his covid is positive  States he is doing well today  Still congested and achy, but not as bad    Aware he needs to quarantine for 10 days  Aware he is to call or seek emergency care/assessment if he becomes SOB, uncontrolled fever    Will route to MD  Will check in later this week

## 2020-09-28 NOTE — Telephone Encounter (Signed)
Assessment for treatment of +COVID19:    Patient is not eligible for antiviral due to lack of comorbid condition and duration of symptoms BUT is eligible for monoclonal antibody - I have placed referral to ID for them to contact [facility] if they have therapy available.    Is patient within 5 days of symptoms onset or positive test (if asymptomatic)? no    Oral antivirals:    Does patient meet NIH recommendations for  prioritizing treatment to the highest risk patients including (but not limited to):   Patients who are within 1 year of receiving B-cell depleting therapies (e.g., rituximab, ocrelizumab, ofatumumab, alemtuzumab) - no   Patients receiving Bruton tyrosine kinase inhibitors- no   Chimeric antigen receptor T cell recipients- no   Post-hematopoietic cell transplant recipients who have chronic graft versus host disease or who are taking immunosuppressive medications for another indication -no   Patients with hematologic malignancies who are on active therapy - no   Lung transplant recipients - no   Patients who are within 1 year of receiving a solid-organ transplant (other than lung transplant) - no   Solid-organ transplant recipients with recent treatment for acute rejection with T or B cell depleting agents - no   Patients with severe combined immunodeficiencies - no   Patients with untreated HIV who have a CD4 T lymphocyte cell count <50 cells/mm  no      Patients receiving the following regimen do not qualify as prioritization class 1A  Azathioprine ?3.0 mg/kg/day   Corticosteroids 20mg  (or 2 mg/kg/day for pts <10 kg) of prednisone or equivalent for <14 days   Hydroxychloroquine   Leflunomide   Methotrexate >0.4 mg/kg/week   (RA dosing generally does not meet this threshold)   Sulfasalazine      If yes sent both paxlovid (enter note to pharmacy to fill first line) and molnupirvair (note to pharmacy to fill second line if paxlovid not available) to the appropriate pharmacy IF PATIENT CONFIRMS  THAT THEY WILL TAKE THE FULL ANTIVIRAL COURSE:    [facility] [place] ([address])  [facility] [place] ([address])  [facility] [place] ([address])   [facility] [place] ([address])   [place] [facility], [facility] ([address])   [place] [facility], hospital ([address])  [facility] ([address])   [facility] [place] [phone])   [address] - [place] ([address]  [facility] [place] ([address])   [facility] [place] ([address])    [facility] [place] ([address])   [facility] [place] ([address]

## 2020-09-29 NOTE — Result Encounter Note (Signed)
Patient positive for covid.  Please call patient/patient representative to notify of results. Thanks   Jodi Geralds, NP

## 2020-10-02 ENCOUNTER — Other Ambulatory Visit: Payer: Self-pay | Admitting: Primary Care

## 2020-10-03 ENCOUNTER — Encounter: Payer: Self-pay | Admitting: Oncology

## 2020-10-03 NOTE — Telephone Encounter (Signed)
Confidential Drug Report  Search Terms: Areon Cocuzza, [birth year]   Search Date: 10/03/2020 08:47:33 AM   Searching on behalf of: [id number] - Karle Barr   The Drug Utilization Report below displays all of the controlled substance prescriptions, if any, that your patient has filled in the last twelve months. The information displayed on this report is compiled from pharmacy submissions to the Department, and accurately reflects the information as submitted by the pharmacies.  This report was requested by: Gerrie Nordmann   Reference #: [id number]   Others' Prescriptions  Patient Name: Cameron [name]   Birth Date: 12-Apr-1996   Address: [address] [place], [place] [address]   Sex: Male   Rx Written Rx Dispensed Drug Quantity Days Supply Prescriber Name Prescriber Dea # Payment Method Dispenser   09/20/2020 09/20/2020 oxycodone hcl (ir) 10 mg tab  84 14 [name], [name] [id number] Other [facility]   09/08/2020 09/08/2020 oxycodone hcl (ir) 10 mg tab  [address] Jodi Geralds [id number] [facility]   08/26/2020 08/26/2020 oxycodone hcl (ir) 15 mg tab  42 7 Jodi Geralds [id number] [facility]   08/17/2020 08/17/2020 oxycodone hcl (ir) 10 mg tab  36 6 Jodi Geralds [id number] Other [facility]   08/04/2020 08/04/2020 oxycodone hcl 10 mg tablet  84 14 [name], [name] MD [id number] [facility]   07/21/2020 07/21/2020 oxycodone hcl 10 mg tablet  84 14 [name], [name] [id number] Other [facility]   07/08/2020 07/08/2020 oxycodone hcl 10 mg tablet  84 14 [name], [name] [id number] Other [facility]   06/23/2020 06/24/2020 oxycodone hcl 10 mg tablet  84 14 [name], [name] [id number] [facility]   06/08/2020 06/08/2020 oxycodone hcl 10 mg tablet  84 14 [name], [name] [id number] [facility]   05/24/2020 05/25/2020 oxycodone hcl 10 mg tablet  84 14 [name], [name] [id number] Other [facility]   05/11/2020 05/11/2020 oxycodone hcl 10 mg tablet  84 14 [name], [name] [id number] Other [facility]   04/26/2020 04/26/2020 oxycodone hcl 10 mg tablet  84 14 [name], [name] [id number] Other [facility]   04/12/2020 04/12/2020 oxycodone hcl 10 mg tablet  84 14 Midge Minium [id number] [facility]   03/28/2020 03/28/2020 oxycodone hcl 10 mg tablet  84 14 Jodi Geralds [id number] [facility]   03/08/2020 03/10/2020 oxycodone hcl 10 mg tablet  [address] Jodi Geralds [id number] Other [facility]   02/27/2020 02/27/2020 oxycodone hcl 10 mg tablet  70 12 [name], [name] MD [id number] Other [facility]   02/04/2020 02/08/2020 oxycodone hcl 10 mg tablet  [address] Jodi Geralds [id number] Other [facility]   01/26/2020 01/26/2020 oxycodone hcl 10 mg tablet  70 12 [place] Grice [id number] [facility]   01/12/2020 01/12/2020 oxycodone hcl 10 mg tablet  [address] Oliva Bustard [id number] Other [facility]   12/29/2019 12/29/2019 oxycodone hcl 10 mg tablet  70 12 [place] Grice [id number] Other [facility]   11/27/2019 11/27/2019 oxycodone hcl (  ir) 10 mg tab  150 25 York Grice [id number] Other [facility]      Patient Name: Cameron [name]   Birth Date: [birth year]   Address: [address] [place], [place] [address]   Sex: Male   Rx Written Rx Dispensed Drug Quantity Days Supply Prescriber Name Prescriber Dea # Payment Method Dispenser   11/02/2019 11/02/2019 oxycodone hcl 10 mg tablet  150 25 Benay Pillow MD [id number] Other [facility]   * - Drugs marked with an asterisk are compound drugs. If the compound  drug is made up of more than one controlled substance, then each controlled substance will be a separate row in the table.

## 2020-10-04 ENCOUNTER — Encounter: Payer: Self-pay | Admitting: Oncology

## 2020-10-04 ENCOUNTER — Ambulatory Visit: Payer: Medicaid Other | Admitting: Internal Medicine

## 2020-10-04 ENCOUNTER — Other Ambulatory Visit: Payer: Self-pay

## 2020-10-04 DIAGNOSIS — D571 Sickle-cell disease without crisis: Secondary | ICD-10-CM

## 2020-10-04 DIAGNOSIS — M25561 Pain in right knee: Secondary | ICD-10-CM

## 2020-10-04 DIAGNOSIS — D564 Hereditary persistence of fetal hemoglobin [HPFH]: Secondary | ICD-10-CM

## 2020-10-04 DIAGNOSIS — D5700 Hb-SS disease with crisis, unspecified: Secondary | ICD-10-CM

## 2020-10-04 MED ORDER — OXYCODONE HCL 10 MG PO TABS *I*
10.0000 mg | ORAL_TABLET | ORAL | 0 refills | Status: DC | PRN
Start: 2020-10-04 — End: 2020-10-04
  Filled 2020-10-04: qty 84, 14d supply, fill #0

## 2020-10-04 MED ORDER — DICLOFENAC SODIUM 1 % EX GEL *I*
CUTANEOUS | 2 refills | Status: DC
Start: 2020-10-04 — End: 2023-02-19
  Filled 2020-10-04: qty 100, 6d supply, fill #0

## 2020-10-04 MED ORDER — OXYCODONE HCL 10 MG PO TABS *I*
10.0000 mg | ORAL_TABLET | ORAL | 0 refills | Status: DC | PRN
Start: 2020-10-04 — End: 2020-10-17
  Filled 2020-10-04: qty 84, 14d supply, fill #0

## 2020-10-04 NOTE — Progress Notes (Signed)
[facility]  SICKLE CELL - ACUTE TELEMEDICINE VISIT    CHIEF COMPLAINT   Sickle cell    Subjective   TELEMEDICINE CONSENT     Visit being conducted by Video in lieu of a face to face visit to minimize health care worker and patient exposure to COVID-19, and conserve PPE.    Location of Patient: home  Location of Telemedicine Provider: hospital / clinical location  Other Participants in telemedicine encounter and roles: None    Consent was obtained from the patient to complete this telemedicine visit; including the potential for financial liability.  How did the patient provide consent? Verbal Consent Only    SUBJECTIVE       Recent COVID infection- 12/28 was his contact with an infected person   Feeling "way better now"   He his first vaccine in but   Denies any residual cough, SOB, chest pain,   Was able to drink and had low appetite but that has improved  Using the bathroom a lot more than he usually did before- denies blood in stool. Denies black stool  Denies abdominal pain     Sickle Cell vaso-occlusive crisis  Back pain is "going in and out"  He was having a lot of pain when he was having covid symptoms  achiness   He was taking his pain medicine along with tylenol and nyquil.     Bilateral knee pain: "aches in my knees"   Denies swelling  This has been happening for a while but previously was      Current home management:  - Non-pharmacologic interventions:heat, hot showers and oral hydration  - Non-opioid pain modifying medications: tylenol  and flexeril  - Short acting Opioid pain medications:oxycodone 10mg  Q4hrs. - this helped keep him more stable during the time and was enough to help his symptoms   - Long acting Opioid pain medications: None    Current disease modifying therapy: Exchange transfusion    Emergency Room visits for this event: 0  Hospitalizations for this event: 0    Medications Reviewed and changes     Current Outpatient Medications:     oxyCODONE (ROXICODONE) 10 mg  immediate release tablet, Take 1 tablet (10 mg total) by mouth every 4 hours as needed for Pain  Max daily dose: 60 mg (6 tablets), Disp: 84 tablet, Rfl: 0    diclofenac (VOLTAREN) 1 % gel, Apply 4 g to affected area 4 times daily., Disp: 100 g, Rfl: 2    cyclobenzaprine (FLEXERIL) 5 mg tablet, Take 1 tablet (5 mg total) by mouth 3 times daily as needed for Muscle spasms  for Muscle Spasm, Disp: 60 tablet, Rfl: 0    mesalamine (ASACOL HD) 800 mg tablet, Take 3 tablets (2,400 mg total) by mouth 2 times daily, Disp: 180 tablet, Rfl: 2    SUMAtriptan (IMITREX) 50 MG tablet, Take 1 tablet (50 mg total) by mouth as needed for Migraine Take at onset of headache. May repeat once in 2 hours., Disp: 2 tablet, Rfl: 0    ferrous sulfate 325 (65 FE) MG tablet, Take 1 tablet (325 mg total) by mouth 2 times daily (with meals), Disp: 100 tablet, Rfl: 3    naloxone (NARCAN) 4 mg/0.1 mL nasal spray, Instill 1 spray in 1 nostril once for opioid reversal. Repeat in alternating nostrils with new package every 2-3 minutes until response (Patient not taking: Reported on 03/21/2020), Disp: 2 each, Rfl: 5  Review of Systems  Objective   OBJECTIVE   PHYSICAL EXAM  This visit was performed during a pandemic event, and the physical exam was limited to my Video observation of this patient's organ systems and/or body areas.   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  -     Discontinue: oxyCODONE (ROXICODONE) 10 mg immediate release tablet; Take 1 tablet (10 mg total) by mouth every 4 hours as  needed for Pain  Max daily dose: 60 mg (6 tablets)        No follow-ups on file.  -Patient instructed to call if symptoms are not improving or worsening    Electronically signed by Signed: Jodi Geralds, NP on 10/04/2020   [facility]

## 2020-10-04 NOTE — Telephone Encounter (Signed)
Confidential Drug Report  Search Terms: Pranav Lince, September 15, 1996   Search Date: 10/04/2020 13:13:59 PM   Searching on behalf of: [id number] - [name]   The Drug Utilization Report below displays all of the controlled substance prescriptions, if any, that your patient has filled in the last twelve months. The information displayed on this report is compiled from pharmacy submissions to the Department, and accurately reflects the information as submitted by the pharmacies.  This report was requested by: Blossom Hoops   Reference #: [id number]   Others' Prescriptions  Patient Name: Nicolae Vasek   Birth Date: Sep 08, 1996   Address: [address] [place], [place] [address]   Sex: Male   Rx Written Rx Dispensed Drug Quantity Days Supply Prescriber Name Prescriber Dea # Payment Method Dispenser   11/02/2019 11/02/2019 oxycodone hcl 10 mg tablet  150 25 Benay Pillow MD [id number] Other [facility]      Patient Name: Anastacio Bua   Birth Date: 02/16/1996   Address: [address] [place], [place] [address]   Sex: Male   Rx Written Rx Dispensed Drug Quantity Days Supply Prescriber Name Prescriber Dea # Payment Method Dispenser   09/20/2020 09/20/2020 oxycodone hcl (ir) 10 mg tab  84 14 [name], [name] [id number] Other [facility]   09/08/2020 09/08/2020 oxycodone hcl (ir) 10 mg tab  [address] Jodi Geralds [id number] Other [facility]   08/26/2020 08/26/2020 oxycodone hcl (ir) 15 mg tab  42 7 Jodi Geralds [id number] Other [facility]   08/17/2020 08/17/2020 oxycodone hcl (ir) 10 mg tab  36 6 Jodi Geralds [id number] Other [facility]   08/04/2020 08/04/2020 oxycodone hcl 10 mg tablet  84 14 [name], [name] MD [id number] Other [facility]   07/21/2020 07/21/2020 oxycodone hcl 10 mg tablet  84 14 [name], [name] [id number] Other [facility]   07/08/2020  07/08/2020 oxycodone hcl 10 mg tablet  84 14 [name], [name] [id number] Other [facility]   06/23/2020 06/24/2020 oxycodone hcl 10 mg tablet  84 14 [name], [name] MD [id number] Other [facility]   06/08/2020 06/08/2020 oxycodone hcl 10 mg tablet  84 14 [name], [name] [id number] Other [facility]   05/24/2020 05/25/2020 oxycodone hcl 10 mg tablet  84 14 [name], [name] [id number] Other [facility]   05/11/2020 05/11/2020 oxycodone hcl 10 mg tablet  84 14 [name], [name] [id number] Other [facility]   04/26/2020 04/26/2020 oxycodone hcl 10 mg tablet  84 14 [name], [name] [id number] Other [facility]   04/12/2020 04/12/2020 oxycodone hcl 10 mg tablet  84 14 Midge Minium [id number] Other [facility]   03/28/2020 03/28/2020 oxycodone hcl 10 mg tablet  84 14 Jodi Geralds [id number] Other [facility]   03/08/2020 03/10/2020 oxycodone hcl 10 mg tablet  [address] Jodi Geralds [id number] Other [facility]   02/27/2020 02/27/2020 oxycodone hcl 10 mg tablet  70 12 [name], [name] MD [id number] Other [facility]   02/04/2020 02/08/2020 oxycodone hcl 10 mg tablet  [address] Jodi Geralds [id number] Other [facility]   01/26/2020 01/26/2020 oxycodone hcl 10  mg tablet  70 12 [place] Grice [id number] Other [facility]   01/12/2020 01/12/2020 oxycodone hcl 10 mg tablet  [address] Oliva Bustard [id number] Other [facility]   12/29/2019 12/29/2019 oxycodone hcl 10 mg tablet  70 12 [place] Grice [id number] Other [facility]   11/27/2019 11/27/2019 oxycodone hcl (ir) 10 mg tab  150 25 [place] Grice [id number] Other [facility]   * - Drugs marked with an asterisk are compound drugs. If the  compound drug is made up of more than one controlled substance, then each controlled substance will be a separate row in the table.   2022 [facility]

## 2020-10-04 NOTE — Patient Instructions (Addendum)
Knee pain:   Get the xrays completed of your knees- any [facility] outpatient imaging location can do these. Plan to get this done before your next appointment with Dr. Concha Norway  -heating pads, voltaren gel to your knees at night, you can try using ice if this helps and doesn't trigger sickle cell pain.  -continue tylenol as needed  -continue oxycodone as needed  -try doing some gentle stretches of your hamstrings and quads prior to bedtime to see if this helps release some tension of your muscles and reduces the pain prior to laying down at night      Follow up with Nursing to schedule your second covid vaccine.

## 2020-10-04 NOTE — Progress Notes (Signed)
Search Terms: Beecher Furio, 19-Jul-1996   Search Date: 10/04/2020 16:53:27 PM   The Drug Utilization Report below displays all of the controlled substance prescriptions, if any, that your patient has filled in the last twelve months. The information displayed on this report is compiled from pharmacy submissions to the Department, and accurately reflects the information as submitted by the pharmacies.  This report was requested by: [name]   Reference #: [id number]   You have not added a DEA number. Keeping your DEA number(s) up to date on the [facility] # page will enable the separation of your prescriptions from others in the search results.   Others' Prescriptions  Patient Name: Cameron [name]   Birth Date: 10/07/1995   Address: [address] [place], [place] [address]   Sex: Male   Rx Written Rx Dispensed Drug Quantity Days Supply Prescriber Name Prescriber Dea # Payment Method Dispenser   11/02/2019 11/02/2019 oxycodone hcl 10 mg tablet  150 25 Benay Pillow MD [id number] Other [facility]      Patient Name: Cameron [name]   Birth Date: 02-Feb-1996   Address: [address] [place], [place] [address]   Sex: Male   Rx Written Rx Dispensed Drug Quantity Days Supply Prescriber Name Prescriber Dea # Payment Method Dispenser   09/20/2020 09/20/2020 oxycodone hcl (ir) 10 mg tab  84 14 [name], [name] [id number] Other [facility]   09/08/2020 09/08/2020 oxycodone hcl (ir) 10 mg tab  [address] Jodi Geralds [id number] [facility]   08/26/2020 08/26/2020 oxycodone hcl (ir) 15 mg tab  42 7 Jodi Geralds [id number] [facility]   08/17/2020 08/17/2020 oxycodone hcl (ir) 10 mg tab  36 6 Jodi Geralds [id number] [facility]   08/04/2020 08/04/2020 oxycodone hcl 10 mg tablet  84 14 [name], [name] MD [id number] Other [facility]   07/21/2020 07/21/2020 oxycodone hcl 10  mg tablet  84 14 [name], [name] [id number] [facility]   07/08/2020 07/08/2020 oxycodone hcl 10 mg tablet  84 14 [name], [name] MD [id number] Other [facility]   06/23/2020 06/24/2020 oxycodone hcl 10 mg tablet  84 14 [name], [name] MD [id number] [facility]   06/08/2020 06/08/2020 oxycodone hcl 10 mg tablet  84 14 [name], [name] [id number] Other [facility]   05/24/2020 05/25/2020 oxycodone hcl 10 mg tablet  84 14 [name], [name] [id number] Other [facility]   05/11/2020 05/11/2020 oxycodone hcl 10 mg tablet  84 14 [name], [name] [id number] Other [facility]   04/26/2020 04/26/2020 oxycodone hcl 10 mg tablet  84 14 [name], [name] [id number] Other [facility]   04/12/2020 04/12/2020 oxycodone hcl 10 mg tablet  84 14 Midge Minium [id number] [facility]   03/28/2020 03/28/2020 oxycodone hcl 10 mg tablet  84 14 Jodi Geralds [id number] [facility]   03/08/2020 03/10/2020 oxycodone hcl 10 mg tablet  [address] Jodi Geralds [id number] Other [facility]   02/27/2020 02/27/2020 oxycodone hcl 10 mg tablet  70 12 PulcinoGwenith Spitz MD [id number] Other [facility]   02/04/2020 02/08/2020 oxycodone hcl 10 mg tablet  [address] Jodi Geralds [id number] [facility]   01/26/2020 01/26/2020 oxycodone hcl 10 mg tablet  70 12 [place] Grice [id number] [facility]   01/12/2020 01/12/2020 oxycodone hcl 10 mg tablet  [address] Oliva Bustard [id number] Other [facility]   12/29/2019 12/29/2019 oxycodone hcl 10 mg tablet  70 12 [place] Grice [id number] Other [facility]   11/27/2019 11/27/2019 oxycodone hcl (ir) 10 mg tab  150 25 [place] Grice [id number] Other  [facility]

## 2020-10-05 ENCOUNTER — Telehealth: Payer: Self-pay

## 2020-10-05 NOTE — Telephone Encounter (Signed)
-----   Message from Oleh Genin, RN sent at 10/05/2020  2:23 PM EST -----  Can we schedule him for any time in the afternoon on a nurse visit??  Thanks   ----- Message -----  From: Jodi Geralds, NP  Sent: 10/04/2020   5:06 PM EST  To: [facility] Team    Please schedule Cameron [name] for his second covid vaccine.

## 2020-10-07 ENCOUNTER — Other Ambulatory Visit: Payer: Self-pay

## 2020-10-07 NOTE — Progress Notes (Signed)
Progress since last Review: On 1/14 CM called Sergey to assess for immediate care management needs related to [facility] interventions: Marie will notify CM of any immediate barriers that may cause him to miss an appointment or prevent him from having medications he will need / CM will continually assess for potential barriers to attending appointments and receiving prescriptions and contact Murphy for opportunities of assistance.    Current Status: Justn stated that he has no immediate care management needs at this time. He verbalized doing well and taking care of himself. CM praised him for reaching out to providers via [place] as needed. CM reminded him of date and time for upcoming covid vaccination. Deforest confirmed ability to attend the appointment. CM assessed for reliable transportation as a potential barrier to care. Yunis verbalized having his car in working condition and declined any other potential barriers that could prevent him from attending his appointment. CM will continue to monitor chart to continue continuity of care and promote appointment compliance as needed.     Plan to Move Forward:  CM made note in chart of conversation details with Micky available to PCP and care team. CM will remain available to [facility] and care team as needed. CM encouraged him to call or text if any needs arise.

## 2020-10-13 ENCOUNTER — Ambulatory Visit: Payer: Medicaid Other

## 2020-10-17 ENCOUNTER — Encounter: Payer: Self-pay | Admitting: Oncology

## 2020-10-17 ENCOUNTER — Other Ambulatory Visit: Payer: Self-pay | Admitting: Internal Medicine

## 2020-10-17 NOTE — Telephone Encounter (Signed)
Confidential Drug Report  Search Terms: Bodie Abernethy, [birth year]   Search Date: 10/17/2020 14:56:19 PM   Searching on behalf of: [id number] - [name]   The Drug Utilization Report below displays all of the controlled substance prescriptions, if any, that your patient has filled in the last twelve months. The information displayed on this report is compiled from pharmacy submissions to the Department, and accurately reflects the information as submitted by the pharmacies.  This report was requested by: Blossom Hoops   Reference #: [id number]   Others' Prescriptions  Patient Name: Cameron [name]   Birth Date: [birth year]   Address: [address] [place], [place] [address]   Sex: Male   Rx Written Rx Dispensed Drug Quantity Days Supply Prescriber Name Prescriber Dea # Payment Method Dispenser   10/04/2020 10/04/2020 oxycodone hcl (ir) 10 mg tab  [address] Jodi Geralds [id number] Other [facility]   09/20/2020 09/20/2020 oxycodone hcl (ir) 10 mg tab  84 14 [name], [name] MD [id number] [facility]   09/08/2020 09/08/2020 oxycodone hcl (ir) 10 mg tab  [address] Jodi Geralds [id number] Other [facility]   08/26/2020 08/26/2020 oxycodone hcl (ir) 15 mg tab  42 7 Jodi Geralds [id number] Other [facility]   08/17/2020 08/17/2020 oxycodone hcl (ir) 10 mg tab  36 6 Jodi Geralds [id number] Other [facility]   08/04/2020 08/04/2020 oxycodone hcl 10 mg tablet  84 14 [name], [name] MD [id number] Other [facility]   07/21/2020 07/21/2020 oxycodone hcl 10 mg tablet  84 14 [name], [name] [id number] Other [facility]   07/08/2020 07/08/2020 oxycodone hcl 10 mg tablet  84 14 [name], [name] [id number] Other [facility]   06/23/2020 06/24/2020 oxycodone hcl 10 mg tablet  84 14 [name], [name] MD [id number] Other [facility]   06/08/2020 06/08/2020 oxycodone hcl 10 mg tablet  84 14 [name], [name] [id number] Other [facility]   05/24/2020 05/25/2020 oxycodone hcl 10 mg tablet  84 14 [name], [name] [id number] Other [facility]   05/11/2020 05/11/2020 oxycodone hcl 10 mg tablet  84 14 [name], [name] [id number] Other [facility]   04/26/2020 04/26/2020 oxycodone hcl 10 mg tablet  84 14 [name], [name] [id number] Other [facility]   04/12/2020 04/12/2020 oxycodone hcl 10 mg tablet  84 14 Midge Minium [id number] Other [facility]   03/28/2020 03/28/2020 oxycodone hcl 10 mg tablet  84 14 Jodi Geralds [id number] [facility]   03/08/2020 03/10/2020 oxycodone hcl 10 mg tablet  [address] Jodi Geralds [id number] Other [facility]   02/27/2020 02/27/2020 oxycodone hcl 10 mg tablet  70 12 [name], [name] MD [id number] Other [facility]   02/04/2020 02/08/2020 oxycodone hcl 10 mg tablet  [address] Jodi Geralds [id number] Other [facility]   01/26/2020 01/26/2020 oxycodone hcl 10 mg tablet  70 12 [place] Grice [id number] Other [facility]   01/12/2020 01/12/2020 oxycodone hcl 10 mg tablet  [address] Oliva Bustard [id number] Other [facility]   12/29/2019 12/29/2019 oxycodone hcl  10 mg tablet  70 12 [place] Grice [id number] Other [facility]   11/27/2019 11/27/2019 oxycodone hcl (ir) 10 mg tab  150 25 [place] Grice [id number] Other [facility]      Patient Name: Cameron [name]   Birth Date: 1996/06/26   Address: [address] [place], [place] [address]   Sex: Male     Rx Written Rx Dispensed Drug Quantity Days Supply Prescriber Name Prescriber Dea # Payment Method Dispenser   11/02/2019 11/02/2019 oxycodone hcl 10 mg tablet  150 25  Benay Pillow MD [id number] Other [facility]     * - Drugs marked with an asterisk are compound drugs. If the compound drug is made up of more than one controlled substance, then each controlled substance will be a separate row in the table.     2022 [facility]

## 2020-10-17 NOTE — Telephone Encounter (Signed)
Confidential Drug Report  Search Terms: Cameron [name], 01-20-1996   Search Date: 10/17/2020 10:10:01 AM   Searching on behalf of: [id number] - Karle Barr   The Drug Utilization Report below displays all of the controlled substance prescriptions, if any, that your patient has filled in the last twelve months. The information displayed on this report is compiled from pharmacy submissions to the Department, and accurately reflects the information as submitted by the pharmacies.  This report was requested by: Gerrie Nordmann   Reference #: [id number]   Others' Prescriptions  Patient Name: Cameron [name]   Birth Date: 05/01/1996   Address: [address] [place], [place] [address]   Sex: Male   Rx Written Rx Dispensed Drug Quantity Days Supply Prescriber Name Prescriber Dea # Payment Method Dispenser   11/02/2019 11/02/2019 oxycodone hcl 10 mg tablet  150 25 Benay Pillow MD [id number] Other [facility]      Patient Name: Cameron [name]   Birth Date: 1996-06-06   Address: [address] [place], [place] [address]   Sex: Male     Rx Written Rx Dispensed Drug Quantity Days Supply Prescriber Name Prescriber Dea # Payment Method Dispenser   10/04/2020 10/04/2020 oxycodone hcl (ir) 10 mg tab  [address] Jodi Geralds [id number] Other [facility]   09/20/2020 09/20/2020 oxycodone hcl (ir) 10 mg tab  84 14 [name], [name] MD [id number] Other [facility]   09/08/2020 09/08/2020 oxycodone hcl (ir) 10 mg tab  [address] Jodi Geralds [id number] Other [facility]   08/26/2020 08/26/2020 oxycodone hcl (ir) 15 mg tab  42 7 Jodi Geralds [id number] [facility]   08/17/2020 08/17/2020 oxycodone hcl (ir) 10 mg tab  36 6 Jodi Geralds [id number] Other [facility]   08/04/2020 08/04/2020 oxycodone hcl 10 mg tablet  84 14 [name], [name] MD [id number] Other [facility]   07/21/2020  07/21/2020 oxycodone hcl 10 mg tablet  84 14 [name], [name] [id number] Other [facility]   07/08/2020 07/08/2020 oxycodone hcl 10 mg tablet  84 14 [name], [name] MD [id number] Other [facility]   06/23/2020 06/24/2020 oxycodone hcl 10 mg tablet  84 14 [name], [name] MD [id number] Other [facility]   06/08/2020 06/08/2020 oxycodone hcl 10 mg tablet  84 14 [name], [name] [id number] Other [facility]   05/24/2020 05/25/2020 oxycodone hcl 10 mg tablet  84 14 [name], [name] [id number] Other [facility]   05/11/2020 05/11/2020 oxycodone hcl 10 mg tablet  84 14 [name], [name] [id number] Other [facility]   04/26/2020 04/26/2020 oxycodone hcl 10 mg tablet  84 14 [name], [name] [id number] Other [facility]   04/12/2020 04/12/2020 oxycodone hcl 10 mg tablet  84 14 Midge Minium [id number] Other [facility]   03/28/2020 03/28/2020 oxycodone hcl 10 mg tablet  84 14 Jodi Geralds [id number] Other [facility]   03/08/2020 03/10/2020 oxycodone hcl 10 mg tablet  [address] Jodi Geralds [id number] Other [facility]   02/27/2020 02/27/2020 oxycodone hcl 10 mg tablet  70 12 PulcinoGwenith Spitz MD [id number] Other [facility]   02/04/2020 02/08/2020  oxycodone hcl 10 mg tablet  [address] Jodi Geralds [id number] Other [facility]   01/26/2020 01/26/2020 oxycodone hcl 10 mg tablet  70 12 [place] Grice [id number] Other [facility]   01/12/2020 01/12/2020 oxycodone hcl 10 mg tablet  [address] Oliva Bustard [id number] Other [facility]   12/29/2019 12/29/2019 oxycodone hcl 10 mg tablet  70 12 [place] Grice [id number] Other [facility]   11/27/2019 11/27/2019 oxycodone hcl (ir) 10 mg tab  150 25 [place] Grice [id number] Other [facility]     * - Drugs marked with an asterisk are compound drugs. If the compound drug is made up of more than one controlled substance, then each controlled substance will be a separate row in the table.

## 2020-10-18 ENCOUNTER — Encounter: Payer: Self-pay | Admitting: Oncology

## 2020-10-18 ENCOUNTER — Other Ambulatory Visit: Payer: Self-pay

## 2020-10-18 MED ORDER — OXYCODONE HCL 10 MG PO TABS *I*
10.0000 mg | ORAL_TABLET | ORAL | 0 refills | Status: DC | PRN
Start: 2020-10-18 — End: 2020-10-31
  Filled 2020-10-18: qty 84, 14d supply, fill #0

## 2020-10-18 NOTE — Telephone Encounter (Signed)
Writer spoke to patient. Patient reports the patient is currently out of pain medication. Patient requests medication order is sent to the pharmacy today for completion.

## 2020-10-20 NOTE — Telephone Encounter (Signed)
This is being addressed in a newer task.

## 2020-10-27 ENCOUNTER — Ambulatory Visit: Payer: Medicaid Other | Admitting: Primary Care

## 2020-10-31 ENCOUNTER — Other Ambulatory Visit: Payer: Self-pay | Admitting: Primary Care

## 2020-10-31 ENCOUNTER — Encounter: Payer: Self-pay | Admitting: Primary Care

## 2020-10-31 ENCOUNTER — Encounter: Payer: Self-pay | Admitting: Oncology

## 2020-10-31 NOTE — Telephone Encounter (Signed)
Confidential Drug Report  Search Terms: Abelardo Seidner, 12-20-1995   Search Date: 10/31/2020 09:37:29 AM   Searching on behalf of: [id number] - [name]   The Drug Utilization Report below displays all of the controlled substance prescriptions, if any, that your patient has filled in the last twelve months. The information displayed on this report is compiled from pharmacy submissions to the Department, and accurately reflects the information as submitted by the pharmacies.  This report was requested by: Alvira Philips   Reference #: [id number]   Others' Prescriptions  Patient Name: Cameron [name]   Birth Date: 1996-09-21   Address: [address] [place], [place] [address]   Sex: Male   Rx Written Rx Dispensed Drug Quantity Days Supply Prescriber Name Prescriber Dea # Payment Method Dispenser   10/18/2020 10/18/2020 oxycodone hcl (ir) 10 mg tab  84 14 [name], [name] [id number] Other [facility]   10/04/2020 10/04/2020 oxycodone hcl (ir) 10 mg tab  [address] Jodi Geralds [id number] Other [facility]   09/20/2020 09/20/2020 oxycodone hcl (ir) 10 mg tab  84 14 [name], [name] MD [id number] Other [facility]   09/08/2020 09/08/2020 oxycodone hcl (ir) 10 mg tab  [address] Jodi Geralds [id number] Other [facility]   08/26/2020 08/26/2020 oxycodone hcl (ir) 15 mg tab  42 7 Jodi Geralds [id number] Other [facility]   08/17/2020 08/17/2020 oxycodone hcl (ir) 10 mg tab  36 6 Jodi Geralds [id number] Other [facility]   08/04/2020 08/04/2020 oxycodone hcl 10 mg tablet  84 14 [name], [name] MD [id number] Other [facility]   07/21/2020 07/21/2020 oxycodone hcl 10 mg tablet  84 14 [name], [name] [id number] Other [facility]   07/08/2020 07/08/2020 oxycodone hcl 10 mg tablet  84 14 [name], [name] [id number] Other [facility]   06/23/2020 06/24/2020 oxycodone hcl 10 mg tablet  84 14 [name], [name] [id number] Other [facility]   06/08/2020 06/08/2020 oxycodone hcl 10 mg tablet  84 14 [name], [name] [id number] Other [facility]   05/24/2020 05/25/2020 oxycodone hcl 10 mg tablet  84 14 [name], [name] [id number] Other [facility]   05/11/2020 05/11/2020 oxycodone hcl 10 mg tablet  84 14 [name], [name] [id number] Other [facility]   04/26/2020 04/26/2020 oxycodone hcl 10 mg tablet  84 14 [name], [name] [id number] Other [facility]   04/12/2020 04/12/2020 oxycodone hcl 10 mg tablet  84 14 Midge Minium [id number] Other [facility]   03/28/2020 03/28/2020 oxycodone hcl 10 mg tablet  84 14 Jodi Geralds [id number] Other [facility]   03/08/2020 03/10/2020 oxycodone hcl 10 mg tablet  [address] Jodi Geralds [id number] Other [facility]   02/27/2020 02/27/2020 oxycodone hcl 10 mg tablet  70 12 [name], [name] MD [id number] [facility]   02/04/2020 02/08/2020 oxycodone hcl 10 mg tablet  [address] Jodi Geralds [id number] Other [facility]   01/26/2020 01/26/2020 oxycodone hcl 10 mg tablet  70 12 [place] Grice [id number] Other [facility]   01/12/2020 01/12/2020 oxycodone  hcl (ir) 10 mg tab  [address] Oliva Bustard [id number] Other [facility]   12/29/2019 12/29/2019 oxycodone hcl 10 mg tablet  70 12 [place] Grice [id number] Other [facility]   11/27/2019 11/27/2019 oxycodone hcl (ir) 10 mg tab  150 25 [place] Grice [id number] Other [facility]      Patient Name: Cameron [name]   Birth Date: 06-21-1996   Address: [address] [place], [place] [address]   Sex: Male     Rx Written Rx Dispensed Drug  Quantity Days Supply Prescriber Name Prescriber Dea # Payment Method Dispenser   11/02/2019 11/02/2019 oxycodone hcl 10 mg tablet  150 25 Benay Pillow MD [id number] Other [facility]     * - Drugs marked with an asterisk are compound drugs. If the compound drug is made up of more than one controlled substance, then each controlled substance will be a separate row in the table.     2022 [facility]

## 2020-11-01 ENCOUNTER — Other Ambulatory Visit: Payer: Self-pay

## 2020-11-01 MED ORDER — OXYCODONE HCL 10 MG PO TABS *I*
10.0000 mg | ORAL_TABLET | ORAL | 0 refills | Status: DC | PRN
Start: 2020-11-01 — End: 2020-11-14
  Filled 2020-11-01: qty 84, 14d supply, fill #0

## 2020-11-01 NOTE — Telephone Encounter (Signed)
Confidential Drug Report  Search Terms: Emilo Gras, 06/28/1996   Search Date: 11/01/2020 08:10:54 AM   Searching on behalf of: [id number] - [name]   The Drug Utilization Report below displays all of the controlled substance prescriptions, if any, that your patient has filled in the last twelve months. The information displayed on this report is compiled from pharmacy submissions to the Department, and accurately reflects the information as submitted by the pharmacies.  This report was requested by: Blossom Hoops   Reference #: [id number]   Others' Prescriptions  Patient Name: Cameron [name]   Birth Date: Nov 04, 1995   Address: [address] [place], [place] [address]   Sex: Male   Rx Written Rx Dispensed Drug Quantity Days Supply Prescriber Name Prescriber Dea # Payment Method Dispenser   10/18/2020 10/18/2020 oxycodone hcl (ir) 10 mg tab  84 14 [name], [name] [id number] Other [facility]   10/04/2020 10/04/2020 oxycodone hcl (ir) 10 mg tab  [address] Jodi Geralds [id number] Other [facility]   09/20/2020 09/20/2020 oxycodone hcl (ir) 10 mg tab  84 14 [name], [name] MD [id number] Other [facility]   09/08/2020 09/08/2020 oxycodone hcl (ir) 10 mg tab  [address] Jodi Geralds [id number] Other [facility]   08/26/2020 08/26/2020 oxycodone hcl (ir) 15 mg tab  42 7 Jodi Geralds [id number] Other [facility]   08/17/2020 08/17/2020 oxycodone hcl (ir) 10 mg tab  36 6 Jodi Geralds [id number] Other [facility]   08/04/2020 08/04/2020 oxycodone hcl 10 mg tablet  84 14 [name], [name] MD [id number] Other [facility]   07/21/2020 07/21/2020 oxycodone hcl 10 mg tablet  84 14 [name], [name] [id number] Other [facility]   07/08/2020 07/08/2020 oxycodone hcl 10 mg tablet  84 14 [name], [name] [id number] Other [facility]   06/23/2020 06/24/2020 oxycodone hcl 10 mg tablet  84 14 [name], [name] [id number] Other [facility]   06/08/2020 06/08/2020 oxycodone hcl 10 mg tablet  84 14 [name], [name] [id number] [facility]   05/24/2020 05/25/2020 oxycodone hcl 10 mg tablet  84 14 [name], [name] [id number] Other [facility]   05/11/2020 05/11/2020 oxycodone hcl 10 mg tablet  84 14 [name], [name] [id number] Other [facility]   04/26/2020 04/26/2020 oxycodone hcl 10 mg tablet  84 14 [name], [name] [id number] [facility]   04/12/2020 04/12/2020 oxycodone hcl 10 mg tablet  84 14 Midge Minium [id number] Other [facility]   03/28/2020 03/28/2020 oxycodone hcl 10 mg tablet  84 14 Jodi Geralds [id number] [facility]   03/08/2020 03/10/2020 oxycodone hcl 10 mg tablet  [address] Jodi Geralds [id number] [facility]   02/27/2020 02/27/2020 oxycodone hcl 10 mg tablet  70 12 [name], [name] MD [id number] [facility]   02/04/2020 02/08/2020 oxycodone hcl 10 mg tablet  [address] Jodi Geralds [id number] Other [facility]   01/26/2020 01/26/2020 oxycodone hcl 10 mg tablet  70 12 [place] Grice [id number] [facility]   01/12/2020 01/12/2020 oxycodone  hcl (ir) 10 mg tab  [address] Oliva Bustard [id number] [facility]   12/29/2019 12/29/2019 oxycodone hcl 10 mg tablet  70 12 [place] Grice [id number] Other [facility]   11/27/2019 11/27/2019 oxycodone hcl (ir) 10 mg tab  150 25 [place] Grice [id number] Other [facility]     * - Drugs marked with an asterisk are compound drugs. If the compound drug is made up of more than one controlled substance, then each controlled substance  will be a separate row in the table.     2022 [facility]

## 2020-11-01 NOTE — Telephone Encounter (Signed)
Patient Name: Cameron [name]   Birth Date: [birth year]   Address: [address] [place], [place] [address]   Sex: Male   Rx Written Rx Dispensed Drug Quantity Days Supply Prescriber Name Prescriber Dea # Payment Method Dispenser   10/18/2020 10/18/2020 oxycodone hcl (ir) 10 mg tab  84 14 [name], [name] MD [id number] Other [facility]   09/20/2020 09/20/2020 oxycodone hcl (ir) 10 mg tab  84 14 [name], [name] MD [id number] [facility]   08/04/2020 08/04/2020 oxycodone hcl 10 mg tablet  84 14 [name], [name] MD [id number] [facility]   07/21/2020 07/21/2020 oxycodone hcl 10 mg tablet  84 14 [name], [name] [id number] [facility]   07/08/2020 07/08/2020 oxycodone hcl 10 mg tablet  84 14 [name], [name] [id number] [facility]   06/23/2020 06/24/2020 oxycodone hcl 10 mg tablet  84 14 [name], [name] MD [id number] [facility]   06/08/2020 06/08/2020 oxycodone hcl 10 mg tablet  84 14 [name], [name] [id number] [facility]   05/24/2020 05/25/2020 oxycodone hcl 10 mg tablet  84 14 [name], [name] [id number] [facility]   05/11/2020 05/11/2020 oxycodone hcl 10 mg tablet  84 14 [name], [name] [id number] [facility]   04/26/2020 04/26/2020 oxycodone hcl 10 mg tablet  84 14 [name], [name] [id number] [facility]   02/27/2020 02/27/2020 oxycodone hcl 10 mg tablet  70 12 [name], [name] [id number] [facility]   01/26/2020 01/26/2020 oxycodone hcl 10 mg tablet  70 12 [place] Grice [id number] [facility]   12/29/2019 12/29/2019 oxycodone hcl 10 mg tablet  70 12 [place] Grice [id number] [facility]   11/27/2019 11/27/2019 oxycodone hcl (ir) 10 mg tab  150 25  [place] Grice [id number] [facility]     Others' Prescriptions  Patient Name: Cameron [name]   Birth Date: 1996/03/31   Address: [address] [place], [place] [address]   Sex: Male   Rx Written Rx Dispensed Drug Quantity Days Supply Prescriber Name Prescriber Dea # Payment Method Dispenser   10/04/2020 10/04/2020 oxycodone hcl (ir) 10 mg tab  [address] Jodi Geralds [id number] Other [facility]   09/08/2020 09/08/2020 oxycodone hcl (ir) 10 mg tab  [address] Jodi Geralds [id number] [facility]   08/26/2020 08/26/2020 oxycodone hcl (ir) 15 mg tab  42 7 Jodi Geralds [id number] [facility]   08/17/2020 08/17/2020 oxycodone hcl (ir) 10 mg tab  36 6 Jodi Geralds [id number] [facility]   04/12/2020 04/12/2020 oxycodone hcl 10 mg tablet  84 14 Midge Minium [id number] [facility]   03/28/2020 03/28/2020 oxycodone hcl 10 mg tablet  84 14 Jodi Geralds [id number] [facility]   03/08/2020 03/10/2020 oxycodone hcl 10 mg tablet  [address] Jodi Geralds [id number] [facility]   02/04/2020 02/08/2020 oxycodone hcl 10 mg tablet  [address] Jodi Geralds [id number] Other [facility]   01/12/2020 01/12/2020  oxycodone hcl (ir) 10 mg tab  [address] Oliva Bustard [id number] Other [facility]

## 2020-11-01 NOTE — Telephone Encounter (Signed)
Already sent.

## 2020-11-02 NOTE — Telephone Encounter (Signed)
Booked

## 2020-11-08 ENCOUNTER — Ambulatory Visit: Payer: Medicaid Other | Admitting: Internal Medicine

## 2020-11-14 ENCOUNTER — Other Ambulatory Visit: Payer: Self-pay | Admitting: Primary Care

## 2020-11-14 ENCOUNTER — Other Ambulatory Visit: Payer: Self-pay

## 2020-11-14 MED ORDER — OXYCODONE HCL 10 MG PO TABS *I*
10.0000 mg | ORAL_TABLET | ORAL | 0 refills | Status: DC | PRN
Start: 2020-11-14 — End: 2020-11-29
  Filled 2020-11-14: qty 84, 14d supply, fill #0

## 2020-11-14 NOTE — Telephone Encounter (Signed)
Confidential Drug Report  Search Terms: Billyjack Trompeter, 12/04/1995   Search Date: 11/14/2020 11:24:41 AM   Searching on behalf of: [id number] - [name]   The Drug Utilization Report below displays all of the controlled substance prescriptions, if any, that your patient has filled in the last twelve months. The information displayed on this report is compiled from pharmacy submissions to the Department, and accurately reflects the information as submitted by the pharmacies.  This report was requested by: Gerrie Nordmann   Reference #: [id number]   Others' Prescriptions  Patient Name: Cameron [name]   Birth Date: 10/21/1995   Address: [address] [place], [place] [address]   Sex: Male   Rx Written Rx Dispensed Drug Quantity Days Supply Prescriber Name Prescriber Dea # Payment Method Dispenser   11/01/2020 11/01/2020 oxycodone hcl (ir) 10 mg tab  84 14 [name], [name] MD [id number] [facility]   10/18/2020 10/18/2020 oxycodone hcl (ir) 10 mg tab  84 14 [name], [name] MD [id number] [facility]   10/04/2020 10/04/2020 oxycodone hcl (ir) 10 mg tab  [address] Jodi Geralds [id number] [facility]   09/20/2020 09/20/2020 oxycodone hcl (ir) 10 mg tab  84 14 [name], [name] [id number] [facility]   09/08/2020 09/08/2020 oxycodone hcl (ir) 10 mg tab  [address] Jodi Geralds [id number] [facility]   08/26/2020 08/26/2020 oxycodone hcl (ir) 15 mg tab  42 7 Jodi Geralds [id number] [facility]   08/17/2020 08/17/2020 oxycodone hcl (ir) 10 mg tab  36 6 Jodi Geralds [id number] [facility]   08/04/2020 08/04/2020 oxycodone hcl 10 mg tablet  84 14 [name], [name] MD [id number] [facility]   07/21/2020 07/21/2020 oxycodone hcl 10 mg tablet  84 14 [name], [name] [id number] [facility]   07/08/2020 07/08/2020 oxycodone hcl 10 mg tablet  84 14 [name], [name] [id number] [facility]   06/23/2020 06/24/2020 oxycodone hcl 10 mg tablet  84 14 [name], [name] MD [id number] [facility]   06/08/2020 06/08/2020 oxycodone hcl 10 mg tablet  84 14 [name], [name] [id number] Other [facility]   05/24/2020 05/25/2020 oxycodone hcl 10 mg tablet  84 14 [name], [name] [id number] Other [facility]   05/11/2020 05/11/2020 oxycodone hcl 10 mg tablet  84 14 [name], [name] [id number] [facility]   04/26/2020 04/26/2020 oxycodone hcl 10 mg tablet  84 14 [name], [name] [id number] [facility]   04/12/2020 04/12/2020 oxycodone hcl 10 mg tablet  84 14 Midge Minium [id number] Other [facility]   03/28/2020 03/28/2020 oxycodone hcl 10 mg tablet  84 14 Jodi Geralds [id number] [facility]   03/08/2020 03/10/2020 oxycodone hcl 10 mg tablet  [address] Jodi Geralds [id number] Other [facility]   02/27/2020 02/27/2020 oxycodone hcl 10 mg tablet  70 12 [name], [name] MD [id number] [facility]   02/04/2020 02/08/2020 oxycodone hcl 10 mg tablet  [address] Jodi Geralds [id number] Other [facility]   01/26/2020 01/26/2020  oxycodone hcl 10 mg tablet  70 12 [place] Grice [id number] Other [facility]   01/12/2020 01/12/2020 oxycodone hcl (ir) 10 mg tab  [address] Oliva Bustard [id number] Other [facility]   12/29/2019 12/29/2019 oxycodone hcl 10 mg tablet  70 12 [place] Grice [id number] Other [facility]   11/27/2019 11/27/2019 oxycodone hcl (ir) 10 mg tab  150 25 [place] Grice [id number] Other [facility]     * - Drugs marked with  an asterisk are compound drugs. If the compound drug is made up of more than one controlled substance, then each controlled substance will be a separate row in the table.

## 2020-11-15 ENCOUNTER — Other Ambulatory Visit: Payer: Self-pay

## 2020-11-15 NOTE — Progress Notes (Signed)
Person CM attempted to contact: CM attempted to contact Mohsin by phone call and text on 2/21.    Reason: CM called to assess for immediate needs and discuss future care management goals/objectives to work on. CM would also like to review need for care management due to decreased participation with CM and lack of health home needs over the past few months. CM would like to help Gust connect to GI specialist as requested by his PCP over the past year.     Result: Hilario did not answer at this time. CM left a voice mail explaining reason for call and providing call back number. Lavern has not responded to CM text message either by the end of day.     Follow Up Plan: CM will attempt to call again next week for the same reasons. CM will remain available to all members of care team as needed.

## 2020-11-21 ENCOUNTER — Other Ambulatory Visit: Payer: Self-pay

## 2020-11-21 NOTE — Progress Notes (Signed)
Person Cameron [name] attempted to contact: Cameron [name] attempted to contact Cameron [name] by phone call on 2/28.     Reason: Cameron [name] called to assess for immediate needs and discuss future care management goals/objectives to work on. Cameron [name] would also like to review need for care management due to decreased participation with Cameron [name] and lack of health home needs over the past few months. Cameron [name] would like to help Cameron [name] connect to GI specialist as requested by his PCP over the past year, and has the correct phone number to provide so that he can call and schedule an appointment.      Result: Cameron [name] did not answer at this time. Cameron [name] left a voice mail explaining reason for call and providing call back number. Cameron [name] has not responded to Cameron [name] text message either as of today.      Follow Up Plan: Cameron [name] will attempt to call again next week for the same reasons. Cameron [name] will remain available to all members of care team as needed.

## 2020-11-26 ENCOUNTER — Other Ambulatory Visit: Payer: Self-pay | Admitting: Internal Medicine

## 2020-11-28 NOTE — Telephone Encounter (Signed)
Confidential Drug Report  Search Terms: Cameron [name], Aug 22, 1996   Search Date: 11/28/2020 09:35:57 AM   Searching on behalf of: [id number] - Karle Barr   The Drug Utilization Report below displays all of the controlled substance prescriptions, if any, that your patient has filled in the last twelve months. The information displayed on this report is compiled from pharmacy submissions to the Department, and accurately reflects the information as submitted by the pharmacies.  This report was requested by: Gerrie Nordmann   Reference #: [id number]   Others' Prescriptions  Patient Name: Cameron [name]   Birth Date: 1996-08-08   Address: [address] [place], [place] [address]   Sex: Male   Rx Written Rx Dispensed Drug Quantity Days Supply Prescriber Name Prescriber Dea # Payment Method Dispenser   11/14/2020 11/15/2020 oxycodone hcl (ir) 10 mg tab  [address] Jodi Geralds [id number] Other [facility]   11/01/2020 11/01/2020 oxycodone hcl (ir) 10 mg tab  84 14 [name], [name] MD [id number] Other [facility]   10/18/2020 10/18/2020 oxycodone hcl (ir) 10 mg tab  84 14 [name], [name] MD [id number] [facility]   10/04/2020 10/04/2020 oxycodone hcl (ir) 10 mg tab  [address] Jodi Geralds [id number] Other [facility]   09/20/2020 09/20/2020 oxycodone hcl (ir) 10 mg tab  84 14 [name], [name] [id number] [facility]   09/08/2020 09/08/2020 oxycodone hcl (ir) 10 mg tab  84 14 Jodi Geralds [id number] [facility]   08/26/2020 08/26/2020 oxycodone hcl (ir) 15 mg tab  42 7 Jodi Geralds [id number] [facility]   08/17/2020 08/17/2020 oxycodone hcl (ir) 10 mg tab  36 6 Jodi Geralds [id number] [facility]   08/04/2020 08/04/2020 oxycodone hcl 10 mg tablet  84 14 [name], [name] MD [id number] [facility]   07/21/2020 07/21/2020 oxycodone hcl 10 mg tablet  84 14 [name], [name] [id number] [facility]   07/08/2020 07/08/2020 oxycodone hcl 10 mg tablet  84 14 [name], [name] MD [id number] [facility]   06/23/2020 06/24/2020 oxycodone hcl 10 mg tablet  84 14 [name], [name] MD [id number] Other [facility]   06/08/2020 06/08/2020 oxycodone hcl 10 mg tablet  84 14 [name], [name] [id number] [facility]   05/24/2020 05/25/2020 oxycodone hcl 10 mg tablet  84 14 [name], [name] [id number] [facility]   05/11/2020 05/11/2020 oxycodone hcl 10 mg tablet  84 14 [name], [name] [id number] [facility]   04/26/2020 04/26/2020 oxycodone hcl 10 mg tablet  84 14 [name], [name] [id number] [facility]   04/12/2020 04/12/2020 oxycodone hcl 10 mg tablet  84 14 Midge Minium [id number] Other [facility]   03/28/2020 03/28/2020 oxycodone hcl 10 mg tablet  84 14 Jodi Geralds [id number] [facility]   03/08/2020 03/10/2020 oxycodone hcl 10 mg tablet  [address] Jodi Geralds [id number] Other [facility]   02/27/2020 02/27/2020 oxycodone hcl 10 mg tablet  70 12 PulcinoGwenith Spitz MD [id number] Other [facility]   02/04/2020  02/08/2020 oxycodone hcl 10 mg tablet  [address] Jodi Geralds [id number] Other [facility]   01/26/2020 01/26/2020 oxycodone hcl 10 mg tablet  70 12 [place] Grice [id number] Other [facility]   01/12/2020 01/12/2020 oxycodone hcl (ir) 10 mg tab  [address] Oliva Bustard [id number] Other [facility]   12/29/2019 12/29/2019 oxycodone hcl 10 mg tablet  70 12 [place] Grice [id number] Other [facility]     * - Drugs marked with an asterisk  are compound drugs. If the compound drug is made up of more than one controlled substance, then each controlled substance will be a separate row in the table.

## 2020-11-29 ENCOUNTER — Other Ambulatory Visit: Payer: Self-pay | Admitting: Primary Care

## 2020-11-29 ENCOUNTER — Encounter: Payer: Self-pay | Admitting: Oncology

## 2020-11-29 ENCOUNTER — Other Ambulatory Visit: Payer: Self-pay

## 2020-11-29 MED ORDER — OXYCODONE HCL 10 MG PO TABS *I*
10.0000 mg | ORAL_TABLET | ORAL | 0 refills | Status: DC | PRN
Start: 2020-11-29 — End: 2020-12-12
  Filled 2020-11-29: qty 84, 14d supply, fill #0

## 2020-11-29 NOTE — Telephone Encounter (Signed)
Confidential Drug Report  Search Terms: Marwin Primmer, 08-Sep-1996   Search Date: 11/29/2020 15:46:33 PM   Searching on behalf of: [id number] - Karle Barr   The Drug Utilization Report below displays all of the controlled substance prescriptions, if any, that your patient has filled in the last twelve months. The information displayed on this report is compiled from pharmacy submissions to the Department, and accurately reflects the information as submitted by the pharmacies.  This report was requested by: Gerrie Nordmann   Reference #: [id number]   Others' Prescriptions  Patient Name: Cameron [name]   Birth Date: 09-05-1996   Address: [address] [place], [place] [address]   Sex: Male   Rx Written Rx Dispensed Drug Quantity Days Supply Prescriber Name Prescriber Dea # Payment Method Dispenser   11/14/2020 11/15/2020 oxycodone hcl (ir) 10 mg tab  [address] Jodi Geralds [id number] Other [facility]   11/01/2020 11/01/2020 oxycodone hcl (ir) 10 mg tab  84 14 [name], [name] MD [id number] Other [facility]   10/18/2020 10/18/2020 oxycodone hcl (ir) 10 mg tab  84 14 [name], [name] MD [id number] Other [facility]   10/04/2020 10/04/2020 oxycodone hcl (ir) 10 mg tab  [address] Jodi Geralds [id number] Other [facility]   09/20/2020 09/20/2020 oxycodone hcl (ir) 10 mg tab  84 14 [name], [name] [id number] [facility]   09/08/2020 09/08/2020 oxycodone hcl (ir) 10 mg tab  84 14 Jodi Geralds [id number] [facility]   08/26/2020 08/26/2020 oxycodone hcl (ir) 15 mg tab  42 7 Jodi Geralds [id number] [facility]   08/17/2020 08/17/2020 oxycodone hcl (ir) 10 mg tab  36 6 Jodi Geralds [id number] [facility]   08/04/2020 08/04/2020 oxycodone hcl 10 mg tablet  84 14 [name], [name] MD [id number] Other [facility]   07/21/2020 07/21/2020 oxycodone hcl 10 mg tablet  84 14 [name], [name] [id number] Other [facility]   07/08/2020 07/08/2020 oxycodone hcl 10 mg tablet  84 14 [name], [name] MD [id number] Other [facility]   06/23/2020 06/24/2020 oxycodone hcl 10 mg tablet  84 14 [name], [name] MD [id number] Other [facility]   06/08/2020 06/08/2020 oxycodone hcl 10 mg tablet  84 14 [name], [name] [id number] Other [facility]   05/24/2020 05/25/2020 oxycodone hcl 10 mg tablet  84 14 [name], [name] [id number] [facility]   05/11/2020 05/11/2020 oxycodone hcl 10 mg tablet  84 14 [name], [name] [id number] [facility]   04/26/2020 04/26/2020 oxycodone hcl 10 mg tablet  84 14 [name], [name] [id number] [facility]   04/12/2020 04/12/2020 oxycodone hcl 10 mg tablet  84 14 Midge Minium [id number] Other [facility]   03/28/2020 03/28/2020 oxycodone hcl 10 mg tablet  84 14 Jodi Geralds [id number] [facility]   03/08/2020 03/10/2020 oxycodone hcl 10 mg tablet  [address] Jodi Geralds [id number] Other [facility]   02/27/2020 02/27/2020 oxycodone hcl 10 mg tablet  70 12 PulcinoGwenith Spitz MD [id number] [facility]   02/04/2020  02/08/2020 oxycodone hcl 10 mg tablet  [address] Jodi Geralds [id number] [facility]   01/26/2020 01/26/2020 oxycodone hcl 10 mg tablet  70 12 [place] Grice [id number] Other [facility]   01/12/2020 01/12/2020 oxycodone hcl (ir) 10 mg tab  [address] Oliva Bustard [id number] Other [facility]   12/29/2019 12/29/2019 oxycodone hcl 10 mg tablet  70 12 [place] Grice [id number] Other [facility]     * - Drugs marked with an asterisk  are compound drugs. If the compound drug is made up of more than one controlled substance, then each controlled substance will be a separate row in the table.

## 2020-11-30 ENCOUNTER — Other Ambulatory Visit: Payer: Self-pay

## 2020-12-07 ENCOUNTER — Other Ambulatory Visit: Payer: Self-pay

## 2020-12-07 NOTE — Progress Notes (Signed)
Person CM attempted to contact: CM attempted to contact [name] by phone call on 3/16.     Reason: CM called to assess for immediate needs and discuss future care management goals/objectives to work on. CM would also like to review need for care management due to decreased participation with CM and lack of health home needs over the past few months. CM would like to help [name] connect to GI specialist as requested by his PCP over the past year, and has the correct phone number to provide so that he can call and schedule an appointment.      Result: CM called one phone number that is out of service. CM called the mobile number in his chart. [name] did not answer at this time and there is no voicemail message set up. CM left a voice mail stating name, job, and provided call back number without additional details due to not knowing if this his new phone number.      Follow Up Plan: CM will attempt to call again for the same reasons. CM will remain available to all members of care team as needed. CM will discuss strategies to move forward with care management supervisors as well as [name] PCP.

## 2020-12-12 ENCOUNTER — Other Ambulatory Visit: Payer: Self-pay | Admitting: Internal Medicine

## 2020-12-13 ENCOUNTER — Other Ambulatory Visit: Payer: Self-pay

## 2020-12-13 MED ORDER — OXYCODONE HCL 10 MG PO TABS *I*
10.0000 mg | ORAL_TABLET | ORAL | 0 refills | Status: DC | PRN
Start: 2020-12-13 — End: 2020-12-26
  Filled 2020-12-13: qty 84, 14d supply, fill #0

## 2020-12-13 NOTE — Telephone Encounter (Signed)
Patient Name: Cameron [name]   Birth Date: 1996/09/09   Address: [address] [place], [place] [address]   Sex: Male   Rx Written Rx Dispensed Drug Quantity Days Supply Prescriber Name Prescriber Dea # Payment Method Dispenser   11/29/2020 11/30/2020 oxycodone hcl (ir) 10 mg tab  [address] Jodi Geralds [id number] Other [facility]   11/14/2020 11/15/2020 oxycodone hcl (ir) 10 mg tab  [address] Jodi Geralds [id number] Other [facility]   11/01/2020 11/01/2020 oxycodone hcl (ir) 10 mg tab  84 14 [name], [name] MD [id number] Other [facility]   10/18/2020 10/18/2020 oxycodone hcl (ir) 10 mg tab  84 14 [name], [name] MD [id number] Other [facility]   10/04/2020 10/04/2020 oxycodone hcl (ir) 10 mg tab  84 14 Jodi Geralds [id number] Other [facility]   09/20/2020 09/20/2020 oxycodone hcl (ir) 10 mg tab  84 14 [name], [name] MD [id number] Other [facility]   09/08/2020 09/08/2020 oxycodone hcl (ir) 10 mg tab  84 14 Jodi Geralds [id number] Other [facility]   08/26/2020 08/26/2020 oxycodone hcl (ir) 15 mg tab  42 7 Jodi Geralds [id number] Other [facility]   08/17/2020 08/17/2020 oxycodone hcl (ir) 10 mg tab  36 6 Jodi Geralds [id number] Other [facility]   08/04/2020 08/04/2020 oxycodone hcl 10 mg tablet  84 14 [name], [name] MD [id number] Other [facility]   07/21/2020 07/21/2020 oxycodone hcl 10 mg tablet  84 14 [name], [name] [id number] Other [facility]   07/08/2020 07/08/2020 oxycodone hcl 10 mg tablet  84 14 [name], [name] [id number] Other [facility]   06/23/2020 06/24/2020 oxycodone hcl 10 mg tablet  84 14 [name], [name] MD [id number] Other [facility]   06/08/2020 06/08/2020 oxycodone hcl 10 mg tablet   84 14 [name], [name] [id number] Other [facility]   05/24/2020 05/25/2020 oxycodone hcl 10 mg tablet  84 14 [name], [name] [id number] Other [facility]   05/11/2020 05/11/2020 oxycodone hcl 10 mg tablet  84 14 [name], [name] [id number] Other [facility]   04/26/2020 04/26/2020 oxycodone hcl 10 mg tablet  84 14 [name], [name] [id number] Other [facility]   04/12/2020 04/12/2020 oxycodone hcl 10 mg tablet  84 14 Midge Minium [id number] Other [facility]   03/28/2020 03/28/2020 oxycodone hcl 10 mg tablet  84 14 Jodi Geralds [id number] Other [facility]   03/08/2020 03/10/2020 oxycodone hcl 10 mg tablet  [address] Jodi Geralds [id number] Other [facility]   02/27/2020 02/27/2020 oxycodone hcl 10 mg tablet  70 12 [name], [name] MD [id number] Other [facility]   02/04/2020 02/08/2020 oxycodone hcl 10 mg tablet  [address] Jodi Geralds [id number] Other [facility]   01/26/2020 01/26/2020 oxycodone hcl 10 mg tablet  70 12 [place] Grice [id number] Other [facility]   01/12/2020 01/12/2020 oxycodone hcl (ir) 10 mg tab  [address] Oliva Bustard [id number] Other [facility]   12/29/2019 12/29/2019 oxycodone hcl  10 mg tablet  70 12 [place] Grice [id number] Other [facility]     * - Drugs marked with an asterisk are compound drugs. If the compound drug is made up of more than one controlled substance, then each controlled substance will be a separate row in the table.

## 2020-12-14 ENCOUNTER — Other Ambulatory Visit: Payer: Self-pay

## 2020-12-20 ENCOUNTER — Other Ambulatory Visit: Payer: Self-pay

## 2020-12-20 NOTE — Progress Notes (Signed)
1. Participants involved in review of Plan: CM and Autry collaborated to review his health home care management care plan over the phone on 12/15/2020.    Progress towards Goals: Cayce continues to make steps toward maintaining his health as he has remained out of the hospital and is well enough to continue working consistently.     Changes to Plan: At this time, Sadiel goal, objectives, and interventions listed on the plan remain unchanged. Corry verbalized that the current plan is still relevant to his life, health care goals, and care management goals during the time of the conversation and declined wanting to add or remove anything at this time. CM encouraged him to call or text if he would like to make any changes prior to next care plan review in 2023.      Updates to Clients Strengths/Preferences: At this time his strengths remain the same.     Updates to Barriers: CM added busy work schedule to his barriers as this is a reason why he has decreased communication and forgets appointments at times.      Client agreement to changes: Derell agreed to goal statement, objectives, and interventions on the care plan. Verbal consent for care plan review has been provided by Audra.     Plan to provide copy to client/providers: The unsinged copy is readily available upon request. On 3/29 CM made a reasonable effort to obtain a signature by mailing home the care plan with instructions to sign and return. The signed copy will be uploaded once received.        2. Assessment Utilized: On 3/24 CM completed the health home care management comprehensive reassessment with Atilano over the phone.    Names/Roles of participants: CM and Dmarius were only participants in completion.    Description of Client participation: CM utilized medical records and information gathered more information from Iziah to complete the assessment. CM assessed for recent changes and no adjustments were needed to reflect the last 12 months.      Status of completion of assessment: The entire assessment was documented and finalized on 12/20/2020.    Summary of findings relevant to Care Management work: Findings relevant to care management work include assisting communicating with PCP and specialty offices as needed, continuing support while Mel continues to work, and assisting with social situations as needed.     Next steps to address findings: Next steps include incorporating more frequent contact and support to Juell and additional reassessments for needs. CM will remain connected with all members of care team to provide useful care management interventions as needed. A copy will be made available to [facility] and providers as needed.           3. Assessment Utilized: CM and Izaac collaborated to review his health home care management crisis plan during over the phone on 12/15/2020.     Names/Roles of participants: CM and Ramiel were the only participants in in completing the plan.    Description of Client participation and understanding: Tamim fully participated with CM, provided honest answers, and verbalized understanding of purpose of plan.     Status of completion of assessment: The entire assessment was completed and reviewed in its entirety at this time. The plan remains unchanged due to Joni agreeing that the information present is still current and relevant to his daily life.     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  Summary of preventative actions CM can support: CM can support Escher by texting him to check in multiple times throughout the month and serving as an active listener to any problems he wants to disclose.     Summary of Support resources to contact if client is in crisis: Support resources Abhinav can contact in the event of a crisis include his girlfriend, family members, CM, and members of health care team. Macyn verbalized understanding of  calling 911 in case of an absolute emergency as well.     Status of provision of copy of the Plan to Client and Care Team: The unsinged copy is readily available upon request. On 3/29 CM made a reasonable effort to obtain a signature by mailing home the crisis plan with instructions to sign and return. The signed copy will be uploaded once received.

## 2020-12-26 ENCOUNTER — Other Ambulatory Visit: Payer: Self-pay | Admitting: Internal Medicine

## 2020-12-26 NOTE — Telephone Encounter (Signed)
Patient Name: Cameron [name]   Birth Date: [birth year]   Address: [address] [place], [place] [address]   Sex: Male   Rx Written Rx Dispensed Drug Quantity Days Supply Prescriber Name Prescriber Dea # Payment Method Dispenser   12/13/2020 12/14/2020 oxycodone hcl (ir) 10 mg tab  [address] Jodi Geralds [id number] Other [facility]   11/29/2020 11/30/2020 oxycodone hcl (ir) 10 mg tab  [address] Jodi Geralds [id number] Other [facility]   11/14/2020 11/15/2020 oxycodone hcl (ir) 10 mg tab  84 14 Jodi Geralds [id number] Other [facility]   11/01/2020 11/01/2020 oxycodone hcl (ir) 10 mg tab  84 14 [name], [name] MD [id number] [facility]   10/18/2020 10/18/2020 oxycodone hcl (ir) 10 mg tab  84 14 [name], [name] MD [id number] [facility]   10/04/2020 10/04/2020 oxycodone hcl (ir) 10 mg tab  84 14 Jodi Geralds [id number] [facility]   09/20/2020 09/20/2020 oxycodone hcl (ir) 10 mg tab  84 14 [name], [name] MD [id number] [facility]   09/08/2020 09/08/2020 oxycodone hcl (ir) 10 mg tab  84 14 Jodi Geralds [id number] [facility]   08/26/2020 08/26/2020 oxycodone hcl (ir) 15 mg tab  42 7 Jodi Geralds [id number] [facility]   08/17/2020 08/17/2020 oxycodone hcl (ir) 10 mg tab  36 6 Jodi Geralds [id number] [facility]   08/04/2020 08/04/2020 oxycodone hcl 10 mg tablet  84 14 [name], [name] MD [id number] Other [facility]   07/21/2020 07/21/2020 oxycodone hcl 10 mg tablet  84 14 [name], [name] [id number] Other [facility]   07/08/2020 07/08/2020 oxycodone hcl 10 mg tablet  84 14 [name], [name] [id number] Other [facility]   06/23/2020 06/24/2020 oxycodone hcl 10 mg  tablet  84 14 [name], [name] MD [id number] Other [facility]   06/08/2020 06/08/2020 oxycodone hcl 10 mg tablet  84 14 [name], [name] [id number] Other [facility]   05/24/2020 05/25/2020 oxycodone hcl 10 mg tablet  84 14 [name], [name] [id number] Other [facility]   05/11/2020 05/11/2020 oxycodone hcl 10 mg tablet  84 14 [name], [name] [id number] Other [facility]   04/26/2020 04/26/2020 oxycodone hcl 10 mg tablet  84 14 [name], [name] [id number] Other [facility]   04/12/2020 04/12/2020 oxycodone hcl 10 mg tablet  84 14 Midge Minium [id number] Other [facility]   03/28/2020 03/28/2020 oxycodone hcl 10 mg tablet  84 14 Jodi Geralds [name] Other [facility]   03/08/2020 03/10/2020 oxycodone hcl 10 mg tablet  [address] Jodi Geralds [id number] Other [facility]   02/27/2020 02/27/2020 oxycodone hcl 10 mg tablet  70 12 [name], [name] MD [id number] Other [facility]   02/04/2020 02/08/2020 oxycodone hcl 10 mg tablet  [address] Jodi Geralds [id number] Other [facility]   01/26/2020 01/26/2020 oxycodone hcl 10 mg tablet  70 12 [place] Grice [id number] Other [facility]   01/12/2020 01/12/2020 oxycodone hcl (ir)  10 mg tab  [address] Oliva Bustard [id number] Other [facility]   12/29/2019 12/29/2019 oxycodone hcl 10 mg tablet  70 12 [place] Grice [id number] Other [facility]     * - Drugs marked with an asterisk are compound drugs. If the compound drug is made up of more than one controlled substance, then each controlled substance will be a separate row in the table.

## 2020-12-27 ENCOUNTER — Other Ambulatory Visit: Payer: Self-pay

## 2020-12-27 MED ORDER — OXYCODONE HCL 10 MG PO TABS *I*
10.0000 mg | ORAL_TABLET | ORAL | 0 refills | Status: DC | PRN
Start: 2020-12-27 — End: 2021-01-08
  Filled 2020-12-27: qty 84, 14d supply, fill #0

## 2021-01-08 ENCOUNTER — Other Ambulatory Visit: Payer: Self-pay | Admitting: Internal Medicine

## 2021-01-09 ENCOUNTER — Other Ambulatory Visit: Payer: Self-pay

## 2021-01-09 ENCOUNTER — Encounter: Payer: Self-pay | Admitting: Internal Medicine

## 2021-01-09 MED ORDER — OXYCODONE HCL 10 MG PO TABS *I*
10.0000 mg | ORAL_TABLET | ORAL | 0 refills | Status: DC | PRN
Start: 2021-01-09 — End: 2021-01-22
  Filled 2021-01-09: qty 84, 14d supply, fill #0

## 2021-01-09 NOTE — Telephone Encounter (Signed)
Routing to provider  

## 2021-01-09 NOTE — Telephone Encounter (Signed)
Others' Prescriptions  Patient Name: Cameron [name]   Birth Date: 12-30-1995   Address: [address] [place], [place] [address]   Sex: Male   Rx Written Rx Dispensed Drug Quantity Days Supply Prescriber Name Prescriber Dea # Payment Method Dispenser   12/27/2020 12/27/2020 oxycodone hcl (ir) 10 mg tab  [address] Jodi Geralds [id number] Other [facility]   12/13/2020 12/14/2020 oxycodone hcl (ir) 10 mg tab  [address] Jodi Geralds [id number] Other [facility]   11/29/2020 11/30/2020 oxycodone hcl (ir) 10 mg tab  84 14 Jodi Geralds [id number] Other [facility]   11/14/2020 11/15/2020 oxycodone hcl (ir) 10 mg tab  84 14 Jodi Geralds [id number] [facility]   11/01/2020 11/01/2020 oxycodone hcl (ir) 10 mg tab  84 14 [name], [name] MD [id number] Other [facility]   10/18/2020 10/18/2020 oxycodone hcl (ir) 10 mg tab  84 14 [name], [name] MD [id number] Other [facility]   10/04/2020 10/04/2020 oxycodone hcl (ir) 10 mg tab  84 14 Jodi Geralds [id number] [facility]   09/20/2020 09/20/2020 oxycodone hcl (ir) 10 mg tab  84 14 [name], [name] [id number] Other [facility]   09/08/2020 09/08/2020 oxycodone hcl (ir) 10 mg tab  84 14 Jodi Geralds [id number] Other [facility]   08/26/2020 08/26/2020 oxycodone hcl (ir) 15 mg tab  42 7 Jodi Geralds [id number] Other [facility]   08/17/2020 08/17/2020 oxycodone hcl (ir) 10 mg tab  36 6 Jodi Geralds [id number] [facility]   08/04/2020 08/04/2020 oxycodone hcl 10 mg tablet  84 14 [name], [name] [id number] Other [facility]   07/21/2020 07/21/2020 oxycodone hcl 10 mg tablet  84 14 [name], [name] [id number] Other [facility]   07/08/2020 07/08/2020  oxycodone hcl 10 mg tablet  84 14 [name], [name] [id number] Other [facility]   06/23/2020 06/24/2020 oxycodone hcl 10 mg tablet  84 14 [name], [name] [id number] Other [facility]   06/08/2020 06/08/2020 oxycodone hcl 10 mg tablet  84 14 [name], [name] [id number] Other [facility]   05/24/2020 05/25/2020 oxycodone hcl 10 mg tablet  84 14 [name], [name] [id number] Other [facility]   05/11/2020 05/11/2020 oxycodone hcl 10 mg tablet  84 14 [name], [name] [id number] Other [facility]   04/26/2020 04/26/2020 oxycodone hcl 10 mg tablet  84 14 [name], [name] [id number] Other [facility]   04/12/2020 04/12/2020 oxycodone hcl 10 mg tablet  84 14 Midge Minium [id number] Other [facility]   03/28/2020 03/28/2020 oxycodone hcl 10 mg tablet  84 14 Jodi Geralds [id number] Other [facility]   03/08/2020 03/10/2020 oxycodone hcl 10 mg tablet  [address] Jodi Geralds [id number] Other [facility]   02/27/2020 02/27/2020 oxycodone hcl 10 mg tablet  70 12 [name], [name] MD [id number] Other [facility]   02/04/2020 02/08/2020 oxycodone hcl 10 mg tablet  [address] Jodi Geralds [id number] Other [facility]   01/26/2020 01/26/2020  oxycodone hcl 10 mg tablet  70 12 [place] Grice [id number] Other [facility]   01/12/2020 01/12/2020 oxycodone hcl (ir) 10 mg tab  [address] Oliva Bustard [id number] Other [facility]     * - Drugs marked with an asterisk are compound drugs. If the compound drug is made up of more than one controlled substance, then each controlled substance will be a separate row in the table.

## 2021-01-17 ENCOUNTER — Other Ambulatory Visit: Payer: Self-pay

## 2021-01-17 NOTE — Progress Notes (Signed)
Person Cameron [name] attempted to contact: Cameron [name] attempted to contact Cameron [name] by multiple phone calls and via text message on 4/25 and 4/26.     Reason: Cameron [name] called to assess for immediate needs, determine health home care management goals, and assist in connecting with GI specialist.      Result: Cameron [name] unable to reach Cameron [name] by phone call due to phone not being in service. Cameron [name] has not received any text responses in the even that his phone is still able to receive text messages.      Follow Up Plan: Cameron [name] will attempt to call again this week for the same reasons until contact is made. Cameron [name] will remain available to all members of care team as needed.

## 2021-01-22 ENCOUNTER — Other Ambulatory Visit: Payer: Self-pay | Admitting: Internal Medicine

## 2021-01-23 ENCOUNTER — Other Ambulatory Visit: Payer: Self-pay

## 2021-01-23 ENCOUNTER — Encounter: Payer: Self-pay | Admitting: Oncology

## 2021-01-23 MED ORDER — OXYCODONE HCL 10 MG PO TABS *I*
10.0000 mg | ORAL_TABLET | ORAL | 0 refills | Status: DC | PRN
Start: 2021-01-23 — End: 2021-01-23
  Filled 2021-01-23: qty 84, 14d supply, fill #0

## 2021-01-23 MED ORDER — OXYCODONE HCL 10 MG PO TABS *I*
10.0000 mg | ORAL_TABLET | ORAL | 0 refills | Status: DC | PRN
Start: 2021-01-23 — End: 2021-02-05
  Filled 2021-01-23: qty 84, 14d supply, fill #0

## 2021-01-23 NOTE — Telephone Encounter (Signed)
Patient Name: Cameron [name]   Birth Date: September 11, 1996   Address: [address] [place], [place] [address]   Sex: Male   Rx Written Rx Dispensed Drug Quantity Days Supply Prescriber Name Prescriber Dea # Payment Method Dispenser   01/09/2021 01/09/2021 oxycodone hcl (ir) 10 mg tab  [address] Jodi Geralds [id number] Other [facility]   12/27/2020 12/27/2020 oxycodone hcl (ir) 10 mg tab  [address] Jodi Geralds [id number] Other [facility]   12/13/2020 12/14/2020 oxycodone hcl (ir) 10 mg tab  84 14 Jodi Geralds [id number] Other [facility]   11/29/2020 11/30/2020 oxycodone hcl (ir) 10 mg tab  84 14 Jodi Geralds [id number] Other [facility]   11/14/2020 11/15/2020 oxycodone hcl (ir) 10 mg tab  84 14 Jodi Geralds [id number] Other [facility]   11/01/2020 11/01/2020 oxycodone hcl (ir) 10 mg tab  84 14 [name], [name] MD [id number] Other [facility]   10/18/2020 10/18/2020 oxycodone hcl (ir) 10 mg tab  84 14 [name], [name] [id number] Other [facility]   10/04/2020 10/04/2020 oxycodone hcl (ir) 10 mg tab  84 14 Jodi Geralds [id number] Other [facility]   09/20/2020 09/20/2020 oxycodone hcl (ir) 10 mg tab  84 14 [name], [name] [id number] Other [facility]   09/08/2020 09/08/2020 oxycodone hcl (ir) 10 mg tab  84 14 Jodi Geralds [id number] Other [facility]   08/26/2020 08/26/2020 oxycodone hcl (ir) 15 mg tab  42 7 Jodi Geralds [id number] Other [facility]   08/17/2020 08/17/2020 oxycodone hcl (ir) 10 mg tab  36 6 Jodi Geralds [id number] [facility]   08/04/2020 08/04/2020 oxycodone hcl 10 mg tablet  84 14 [name], [name] [id number] Other [facility]   07/21/2020 07/21/2020 oxycodone hcl 10 mg  tablet  84 14 [name], [name] [id number] Other [facility]   07/08/2020 07/08/2020 oxycodone hcl 10 mg tablet  84 14 [name], [name] [id number] Other [facility]   06/23/2020 06/24/2020 oxycodone hcl 10 mg tablet  84 14 [name], [name] [id number] Other [facility]   06/08/2020 06/08/2020 oxycodone hcl 10 mg tablet  84 14 [name], [name] [id number] Other [facility]   05/24/2020 05/25/2020 oxycodone hcl 10 mg tablet  84 14 [name], [name] [id number] Other [facility]   05/11/2020 05/11/2020 oxycodone hcl 10 mg tablet  84 14 [name], [name] [id number] Other [facility]   04/26/2020 04/26/2020 oxycodone hcl 10 mg tablet  84 14 [name], [name] [id number] Other [facility]   04/12/2020 04/12/2020 oxycodone hcl 10 mg tablet  84 14 Midge Minium [id number] Other [facility]   03/28/2020 03/28/2020 oxycodone hcl 10 mg tablet  84 14 Jodi Geralds [id number] Other [facility]   03/08/2020 03/10/2020 oxycodone hcl 10 mg tablet  [address] Jodi Geralds [id number] Other [facility]   02/27/2020 02/27/2020 oxycodone hcl (ir) 10 mg tab  70 12 PulcinoGwenith Spitz MD [id number] Other [facility]   02/04/2020 02/08/2020 oxycodone  hcl 10 mg tablet  [address] Jodi Geralds [id number] Other [facility]   01/26/2020 01/26/2020 oxycodone hcl 10 mg tablet  [address] [place] Grice [id number] Other [facility]

## 2021-01-23 NOTE — Telephone Encounter (Signed)
Patient Name: Cameron [name]   Birth Date: September 11, 1996   Address: [address] [place], [place] [address]   Sex: Male   Rx Written Rx Dispensed Drug Quantity Days Supply Prescriber Name Prescriber Dea # Payment Method Dispenser   01/09/2021 01/09/2021 oxycodone hcl (ir) 10 mg tab  [address] Jodi Geralds [id number] Other [facility]   12/27/2020 12/27/2020 oxycodone hcl (ir) 10 mg tab  [address] Jodi Geralds [id number] Other [facility]   12/13/2020 12/14/2020 oxycodone hcl (ir) 10 mg tab  84 14 Jodi Geralds [id number] Other [facility]   11/29/2020 11/30/2020 oxycodone hcl (ir) 10 mg tab  84 14 Jodi Geralds [id number] Other [facility]   11/14/2020 11/15/2020 oxycodone hcl (ir) 10 mg tab  84 14 Jodi Geralds [id number] Other [facility]   11/01/2020 11/01/2020 oxycodone hcl (ir) 10 mg tab  84 14 [name], [name] MD [id number] Other [facility]   10/18/2020 10/18/2020 oxycodone hcl (ir) 10 mg tab  84 14 [name], [name] [id number] [facility]   10/04/2020 10/04/2020 oxycodone hcl (ir) 10 mg tab  84 14 Jodi Geralds [id number] Other [facility]   09/20/2020 09/20/2020 oxycodone hcl (ir) 10 mg tab  84 14 [name], [name] [id number] Other [facility]   09/08/2020 09/08/2020 oxycodone hcl (ir) 10 mg tab  84 14 Jodi Geralds [id number] Other [facility]   08/26/2020 08/26/2020 oxycodone hcl (ir) 15 mg tab  42 7 Jodi Geralds [id number] Other [facility]   08/17/2020 08/17/2020 oxycodone hcl (ir) 10 mg tab  36 6 Jodi Geralds [id number] Other [facility]   08/04/2020 08/04/2020 oxycodone hcl 10 mg tablet  84 14 [name], [name] [id number] Other [facility]   07/21/2020 07/21/2020 oxycodone hcl 10 mg  tablet  84 14 [name], [name] [id number] Other [facility]   07/08/2020 07/08/2020 oxycodone hcl 10 mg tablet  84 14 [name], [name] [id number] Other [facility]   06/23/2020 06/24/2020 oxycodone hcl 10 mg tablet  84 14 [name], [name] [id number] Other [facility]   06/08/2020 06/08/2020 oxycodone hcl 10 mg tablet  84 14 [name], [name] [id number] Other [facility]   05/24/2020 05/25/2020 oxycodone hcl 10 mg tablet  84 14 [name], [name] [id number] Other [facility]   05/11/2020 05/11/2020 oxycodone hcl 10 mg tablet  84 14 [name], [name] [id number] Other [facility]   04/26/2020 04/26/2020 oxycodone hcl 10 mg tablet  84 14 [name], [name] [id number] Other [facility]   04/12/2020 04/12/2020 oxycodone hcl 10 mg tablet  84 14 Midge Minium [id number] Other [facility]   03/28/2020 03/28/2020 oxycodone hcl 10 mg tablet  84 14 Jodi Geralds [id number] Other [facility]   03/08/2020 03/10/2020 oxycodone hcl 10 mg tablet  [address] Jodi Geralds [id number] Other [facility]   02/27/2020 02/27/2020 oxycodone hcl (ir) 10 mg tab  70 12 PulcinoGwenith Spitz MD [id number] Other [facility]   02/04/2020 02/08/2020 oxycodone  hcl 10 mg tablet  [address] Jodi Geralds [id number] Other [facility]   01/26/2020 01/26/2020 oxycodone hcl 10 mg tablet  70 12 [place] Grice [id number] Other [facility]     * - Drugs marked with an asterisk are compound drugs. If the compound drug is made up of more than one controlled substance, then each controlled substance will be a separate row in the table.

## 2021-01-31 ENCOUNTER — Other Ambulatory Visit: Payer: Self-pay

## 2021-01-31 NOTE — Progress Notes (Signed)
Person CM attempted to contact: CM attempted to contact [name] by phone calls and via text message on 5/10.     Reason: CM reached out to assess for immediate needs, determine health home care management goals, and discuss engagement requirements for the health home program.       Result: CM unable to reach Cameron [name] by phone call and left a voice mail requesting a call back. CM has not received any text responses in the event that his phone is still able to receive text messages.      Follow Up Plan: CM will continue outreach efforts for the same reasons until contact is made. CM will remain available to all members of care team as needed. CM will schedule a case review to be conduced the [facility] supervisors and [facility] providers to discuss strategies for connecting, identify barriers to connecting, and review ability to remain enrolled in health home care management.

## 2021-02-05 ENCOUNTER — Other Ambulatory Visit: Payer: Self-pay

## 2021-02-05 ENCOUNTER — Other Ambulatory Visit: Payer: Self-pay | Admitting: Internal Medicine

## 2021-02-05 ENCOUNTER — Other Ambulatory Visit: Payer: Self-pay | Admitting: Primary Care

## 2021-02-06 ENCOUNTER — Other Ambulatory Visit: Payer: Self-pay

## 2021-02-06 ENCOUNTER — Encounter: Payer: Self-pay | Admitting: Internal Medicine

## 2021-02-06 MED ORDER — CYCLOBENZAPRINE HCL 5 MG PO TABS *I*
5.0000 mg | ORAL_TABLET | Freq: Three times a day (TID) | ORAL | 0 refills | Status: DC | PRN
Start: 2021-02-06 — End: 2021-09-21
  Filled 2021-02-06: qty 60, 20d supply, fill #0

## 2021-02-06 MED ORDER — OXYCODONE HCL 10 MG PO TABS *I*
10.0000 mg | ORAL_TABLET | ORAL | 0 refills | Status: DC | PRN
Start: 2021-02-06 — End: 2021-02-17
  Filled 2021-02-06: qty 84, 14d supply, fill #0

## 2021-02-06 NOTE — Telephone Encounter (Signed)
Patient Name: Cameron [name]   Birth Date: 07/25/1996   Address: [address] [place], [place] [address]   Sex: Male   Rx Written Rx Dispensed Drug Quantity Days Supply Prescriber Name Prescriber Dea # Payment Method Dispenser   01/23/2021 01/23/2021 oxycodone hcl (ir) 10 mg tab  [address] Jodi Geralds [id number] Other [facility]   01/09/2021 01/09/2021 oxycodone hcl (ir) 10 mg tab  [address] Jodi Geralds [id number] Other [facility]   12/27/2020 12/27/2020 oxycodone hcl (ir) 10 mg tab  [address] Jodi Geralds [id number] Other [facility]   12/13/2020 12/14/2020 oxycodone hcl (ir) 10 mg tab  84 14 Jodi Geralds [id number] Other [facility]   11/29/2020 11/30/2020 oxycodone hcl (ir) 10 mg tab  84 14 Jodi Geralds [id number] [facility]   11/14/2020 11/15/2020 oxycodone hcl (ir) 10 mg tab  84 14 Jodi Geralds [id number] [facility]   11/01/2020 11/01/2020 oxycodone hcl (ir) 10 mg tab  84 14 [name], [name] MD [id number] Other [facility]   10/18/2020 10/18/2020 oxycodone hcl (ir) 10 mg tab  84 14 [name], [name] [id number] Other [facility]   10/04/2020 10/04/2020 oxycodone hcl (ir) 10 mg tab  84 14 Jodi Geralds [id number] [facility]   09/20/2020 09/20/2020 oxycodone hcl (ir) 10 mg tab  84 14 [name], [name] [id number] [facility]   09/08/2020 09/08/2020 oxycodone hcl (ir) 10 mg tab  84 14 Jodi Geralds [id number] [facility]   08/26/2020 08/26/2020 oxycodone hcl (ir) 15 mg tab  42 7 Jodi Geralds [id number] [facility]   08/17/2020 08/17/2020 oxycodone hcl (ir) 10 mg tab  36 6 Jodi Geralds [id number] [facility]   08/04/2020 08/04/2020 oxycodone hcl 10 mg  tablet  84 14 [name], [name] [id number] [facility]   07/21/2020 07/21/2020 oxycodone hcl 10 mg tablet  84 14 [name], [name] [id number] [facility]   07/08/2020 07/08/2020 oxycodone hcl 10 mg tablet  84 14 [name], [name] [id number] [facility]   06/23/2020 06/24/2020 oxycodone hcl 10 mg tablet  84 14 [name], [name] [id number] Other [facility]   06/08/2020 06/08/2020 oxycodone hcl 10 mg tablet  84 14 [name], [name] [id number] Other [facility]   05/24/2020 05/25/2020 oxycodone hcl 10 mg tablet  84 14 [name], [name] [id number] Other [facility]   05/11/2020 05/11/2020 oxycodone hcl 10 mg tablet  84 14 [name], [name] [id number] [facility]   04/26/2020 04/26/2020 oxycodone hcl 10 mg tablet  84 14 [name], [name] [id number] [facility]   04/12/2020 04/12/2020 oxycodone hcl 10 mg tablet  84 14 Midge Minium [id number] Other [facility]   03/28/2020 03/28/2020 oxycodone hcl 10 mg tablet  84 14 Jodi Geralds [id number] [facility]   03/08/2020 03/10/2020 oxycodone hcl 10 mg tablet  [address] Jodi Geralds [id number] Other [facility]   02/27/2020 02/27/2020 oxycodone hcl (  ir) 10 mg tab  70 12 PulcinoGwenith Spitz MD [id number] Other [facility]   02/04/2020 02/08/2020 oxycodone hcl 10 mg tablet  [address] Jodi Geralds [id number] Other [facility]

## 2021-02-06 NOTE — Telephone Encounter (Signed)
Others' Prescriptions  Patient Name: Cameron [name]   Birth Date: Jun 12, 1996   Address: [address] [place], [place] [address]   Sex: Male   Rx Written Rx Dispensed Drug Quantity Days Supply Prescriber Name Prescriber Dea # Payment Method Dispenser   01/23/2021 01/23/2021 oxycodone hcl (ir) 10 mg tab  [address] Jodi Geralds [id number] Other [facility]   01/09/2021 01/09/2021 oxycodone hcl (ir) 10 mg tab  [address] Jodi Geralds [id number] Other [facility]   12/27/2020 12/27/2020 oxycodone hcl (ir) 10 mg tab  [address] Jodi Geralds [id number] [facility]   12/13/2020 12/14/2020 oxycodone hcl (ir) 10 mg tab  84 14 Jodi Geralds [id number] [facility]   11/29/2020 11/30/2020 oxycodone hcl (ir) 10 mg tab  84 14 Jodi Geralds [id number] [facility]   11/14/2020 11/15/2020 oxycodone hcl (ir) 10 mg tab  84 14 Jodi Geralds [id number] [facility]   11/01/2020 11/01/2020 oxycodone hcl (ir) 10 mg tab  84 14 [name], [name] MD [id number] Other [facility]   10/18/2020 10/18/2020 oxycodone hcl (ir) 10 mg tab  84 14 [name], [name] [id number] Other [facility]   10/04/2020 10/04/2020 oxycodone hcl (ir) 10 mg tab  84 14 Jodi Geralds [id number] [facility]   09/20/2020 09/20/2020 oxycodone hcl (ir) 10 mg tab  84 14 [name], [name] [id number] Other [facility]   09/08/2020 09/08/2020 oxycodone hcl (ir) 10 mg tab  84 14 Jodi Geralds [id number] [facility]   08/26/2020 08/26/2020 oxycodone hcl (ir) 15 mg tab  42 7 Jodi Geralds [id number] [facility]   08/17/2020 08/17/2020 oxycodone hcl (ir) 10 mg tab  36 6 Jodi Geralds [id number] Other [facility]   08/04/2020  08/04/2020 oxycodone hcl 10 mg tablet  84 14 [name], [name] [id number] Other [facility]   07/21/2020 07/21/2020 oxycodone hcl 10 mg tablet  84 14 [name], [name] [id number] Other [facility]   07/08/2020 07/08/2020 oxycodone hcl 10 mg tablet  84 14 [name], [name] [id number] Other [facility]   06/23/2020 06/24/2020 oxycodone hcl 10 mg tablet  84 14 [name], [name] [id number] Other [facility]   06/08/2020 06/08/2020 oxycodone hcl 10 mg tablet  84 14 [name], [name] [id number] Other [facility]   05/24/2020 05/25/2020 oxycodone hcl 10 mg tablet  84 14 [name], [name] [id number] Other [facility]   05/11/2020 05/11/2020 oxycodone hcl 10 mg tablet  84 14 [name], [name] [id number] [facility]   04/26/2020 04/26/2020 oxycodone hcl 10 mg tablet  84 14 [name], [name] [id number] [facility]   04/12/2020 04/12/2020 oxycodone hcl 10 mg tablet  84 14 Midge Minium [id number] Other [facility]   03/28/2020 03/28/2020 oxycodone hcl 10 mg tablet  84 14 Jodi Geralds [id number] Other [facility]   03/08/2020 03/10/2020 oxycodone hcl 10 mg tablet  [address] Jodi Geralds [id number] Other [facility]   02/27/2020  02/27/2020 oxycodone hcl (ir) 10 mg tab  70 12 PulcinoGwenith Spitz MD [id number] Other [facility]   02/04/2020 02/08/2020 oxycodone hcl 10 mg tablet  [address] Jodi Geralds [id number] Other [facility]     * - Drugs marked with an asterisk are compound drugs. If the compound drug is made up of more than one controlled substance, then each controlled substance will be a separate row in the table.

## 2021-02-17 ENCOUNTER — Telehealth: Payer: Self-pay

## 2021-02-17 ENCOUNTER — Encounter: Payer: Self-pay | Admitting: Oncology

## 2021-02-17 ENCOUNTER — Other Ambulatory Visit: Payer: Self-pay

## 2021-02-17 ENCOUNTER — Other Ambulatory Visit: Payer: Self-pay | Admitting: Internal Medicine

## 2021-02-17 MED ORDER — OXYCODONE HCL 10 MG PO TABS *I*
10.0000 mg | ORAL_TABLET | ORAL | 0 refills | Status: DC | PRN
Start: 2021-02-17 — End: 2021-03-02
  Filled 2021-02-17: qty 84, 14d supply, fill #0

## 2021-02-17 NOTE — Telephone Encounter (Signed)
Cameron [name] contacted me about med refills. I told him to call the office, he said no one answered but he will try again. I informed him that the office will be closed Monday and I would let the team know.     Thanks   Jennifier Smitherman

## 2021-02-17 NOTE — Telephone Encounter (Signed)
Writer spoke to patient. Patient requests the patients medication is sent to the pharmacy today due to the patient leaving out of town for work on 05/30.

## 2021-02-17 NOTE — Telephone Encounter (Signed)
Patient Name: Cameron [name]   Birth Date: [birth year]   Address: [address] [place], [place] [address]   Sex: Male   Rx Written Rx Dispensed Drug Quantity Days Supply Prescriber Name Prescriber Dea # Payment Method Dispenser   02/06/2021 02/06/2021 oxycodone hcl (ir) 10 mg tab  [address] Jodi Geralds [id number] Other [facility]   01/23/2021 01/23/2021 oxycodone hcl (ir) 10 mg tab  [address] Jodi Geralds [id number] Other [facility]   01/09/2021 01/09/2021 oxycodone hcl (ir) 10 mg tab  84 14 Jodi Geralds [id number] [facility]   12/27/2020 12/27/2020 oxycodone hcl (ir) 10 mg tab  84 14 Jodi Geralds [id number] [facility]   12/13/2020 12/14/2020 oxycodone hcl (ir) 10 mg tab  84 14 Jodi Geralds [id number] [facility]   11/29/2020 11/30/2020 oxycodone hcl (ir) 10 mg tab  84 14 Jodi Geralds [id number] [facility]   11/14/2020 11/15/2020 oxycodone hcl (ir) 10 mg tab  84 14 Jodi Geralds [id number] Other [facility]   11/01/2020 11/01/2020 oxycodone hcl (ir) 10 mg tab  84 14 [name], [name] MD [id number] Other [facility]   10/18/2020 10/18/2020 oxycodone hcl (ir) 10 mg tab  84 14 [name], [name] [id number] Other [facility]   10/04/2020 10/04/2020 oxycodone hcl (ir) 10 mg tab  84 14 Jodi Geralds [id number] Other [facility]   09/20/2020 09/20/2020 oxycodone hcl (ir) 10 mg tab  84 14 [name], [name] [id number] Other [facility]   09/08/2020 09/08/2020 oxycodone hcl (ir) 10 mg tab  84 14 Jodi Geralds [id number] Other [facility]   08/26/2020 08/26/2020 oxycodone hcl (ir) 15 mg tab  42 7 Jodi Geralds [id number] Other [facility]   08/17/2020 08/17/2020 oxycodone hcl (ir)  10 mg tab  36 6 Jodi Geralds [id number] Other [facility]   08/04/2020 08/04/2020 oxycodone hcl 10 mg tablet  84 14 [name], [name] [id number] Other [facility]   07/21/2020 07/21/2020 oxycodone hcl 10 mg tablet  84 14 [name], [name] [id number] Other [facility]   07/08/2020 07/08/2020 oxycodone hcl 10 mg tablet  84 14 [name], [name] [id number] Other [facility]   06/23/2020 06/24/2020 oxycodone hcl 10 mg tablet  84 14 [name], [name] [id number] Other [facility]   06/08/2020 06/08/2020 oxycodone hcl 10 mg tablet  84 14 [name], [name] [id number] Other [facility]   05/24/2020 05/25/2020 oxycodone hcl 10 mg tablet  84 14 [name], [name] [id number] Other [facility]   05/11/2020 05/11/2020 oxycodone hcl 10 mg tablet  84 14 [name], [name] [id number] Other [facility]   04/26/2020 04/26/2020 oxycodone hcl 10 mg tablet  84 14 [name], [name] [id number] Other [facility]   04/12/2020 04/12/2020 oxycodone hcl 10 mg tablet  84 14 Midge Minium [id number] Other [facility]   03/28/2020 03/28/2020 oxycodone hcl 10 mg tablet  84 14 Jodi Geralds [id number] [facility]   03/08/2020 03/10/2020 oxycodone  hcl 10 mg tablet  [address] Jodi Geralds [id number] Other [facility]   02/27/2020 02/27/2020 oxycodone hcl (ir) 10 mg tab  70 12 PulcinoGwenith Spitz MD [id number] Other [facility]     * - Drugs marked with an asterisk are compound drugs. If the compound drug is made up of more than one controlled substance, then each controlled substance will be a separate row in the table.

## 2021-02-17 NOTE — Telephone Encounter (Signed)
This has been routed in a [employer].

## 2021-02-21 ENCOUNTER — Encounter: Payer: Self-pay | Admitting: Gastroenterology

## 2021-02-21 ENCOUNTER — Other Ambulatory Visit: Payer: Self-pay

## 2021-02-21 NOTE — Progress Notes (Signed)
CM was contacted by Cameron [name] via text requesting CM assistance with obtaining medication refills. CM addressed his concerns by instructing him to call the office and request medication refills. Cameron [name] stated that he sent a message to the office and attempted to call. CM sent a message to [facility] care team regarding his med refills and reason for urgent refill request. CM then spoke to [facility] nursing to review steps for addressing his need and offering CM assistance as needed. No actions required by CM at that time. CM contacted Cameron [name] to confirm he received medication refills and no additional needs related to this barrier to care assessed at this time. Cameron [name] did not present any additional health home or health care needs at this time. CM has updated [facility] chart to include his new phone number.

## 2021-03-01 ENCOUNTER — Other Ambulatory Visit: Payer: Self-pay | Admitting: Internal Medicine

## 2021-03-01 NOTE — Telephone Encounter (Signed)
Patient Name: Cameron [name]   Birth Date: 28-Aug-1996   Address: [address] [place], [place] [address]   Sex: Male   Rx Written Rx Dispensed Drug Quantity Days Supply Prescriber Name Prescriber Dea # Payment Method Dispenser   02/17/2021 02/17/2021 oxycodone hcl (ir) 10 mg tab  [address] Cameron [name] [id number] Other [facility]   02/06/2021 02/06/2021 oxycodone hcl (ir) 10 mg tab  [address] Cameron [name] [id number] Other [facility]   01/23/2021 01/23/2021 oxycodone hcl (ir) 10 mg tab  [address] Cameron [name] [id number] [facility]   01/09/2021 01/09/2021 oxycodone hcl (ir) 10 mg tab  84 14 Cameron [name] [id number] [facility]   12/27/2020 12/27/2020 oxycodone hcl (ir) 10 mg tab  84 14 Cameron [name] [id number] Other [facility]   12/13/2020 12/14/2020 oxycodone hcl (ir) 10 mg tab  84 14 Cameron [name] [id number] [facility]   11/29/2020 11/30/2020 oxycodone hcl (ir) 10 mg tab  84 14 Cameron [name] [id number] Other [facility]   11/14/2020 11/15/2020 oxycodone hcl (ir) 10 mg tab  84 14 Cameron [name] [id number] [facility]   11/01/2020 11/01/2020 oxycodone hcl (ir) 10 mg tab  84 14 Cameron [name], Cameron Spitz MD [id number] [facility]   10/18/2020 10/18/2020 oxycodone hcl (ir) 10 mg tab  84 14 Cameron [name], Cameron L MD [id number] [facility]   10/04/2020 10/04/2020 oxycodone hcl (ir) 10 mg tab  84 14 Cameron [name] [id number] Other [facility]   09/20/2020 09/20/2020 oxycodone hcl (ir) 10 mg tab  84 14 Cameron [name], Cameron L MD [id number] [facility]   09/08/2020 09/08/2020 oxycodone hcl (ir) 10 mg tab  84 14 Cameron [name] [id number] [facility]   08/26/2020 08/26/2020 oxycodone hcl  (ir) 15 mg tab  42 7 Cameron [name] [id number] [facility]   08/17/2020 08/17/2020 oxycodone hcl (ir) 10 mg tab  36 6 Cameron [name] [id number] [facility]   08/04/2020 08/04/2020 oxycodone hcl 10 mg tablet  84 14 Cameron [name], Cameron L MD [id number] [facility]   07/21/2020 07/21/2020 oxycodone hcl 10 mg tablet  84 14 Cameron [name], Cameron L MD [id number] [facility]   07/08/2020 07/08/2020 oxycodone hcl 10 mg tablet  84 14 Cameron [name], Cameron L MD [id number] [facility]   06/23/2020 06/24/2020 oxycodone hcl 10 mg tablet  84 14 Cameron [name], Cameron L MD [id number] [facility]   06/08/2020 06/08/2020 oxycodone hcl 10 mg tablet  84 14 Cameron [name], Cameron L MD [id number] Other [facility]   05/24/2020 05/25/2020 oxycodone hcl 10 mg tablet  84 14 Cameron [name], Cameron L MD [id number] [facility]   05/11/2020 05/11/2020 oxycodone hcl 10 mg tablet  84 14 Cameron [name], Cameron L MD [id number] Other [facility]   04/26/2020 04/26/2020 oxycodone hcl 10 mg tablet  84 14 Cameron [name], Cameron L MD [id number] Other [facility]   04/12/2020 04/12/2020 oxycodone hcl 10 mg tablet  84 14 Cameron [name] [id number] Other [facility]   03/28/2020 03/28/2020  oxycodone hcl 10 mg tablet  84 14 Cameron [name] [id number] Other [facility]   03/08/2020 03/10/2020 oxycodone hcl 10 mg tablet  [address] Cameron [name] [id number] Other [facility]     * - Drugs marked with an asterisk are compound drugs. If the compound drug is made up of more than one controlled substance, then each controlled substance will be a separate row in the table.

## 2021-03-02 ENCOUNTER — Other Ambulatory Visit: Payer: Self-pay

## 2021-03-02 ENCOUNTER — Encounter: Payer: Self-pay | Admitting: Oncology

## 2021-03-02 MED ORDER — OXYCODONE HCL 10 MG PO TABS *I*
10.0000 mg | ORAL_TABLET | ORAL | 0 refills | Status: DC | PRN
Start: 2021-03-02 — End: 2021-03-15
  Filled 2021-03-02 (×2): qty 84, 14d supply, fill #0

## 2021-03-02 NOTE — Telephone Encounter (Signed)
Patient Name: Cameron [name]   Birth Date: September 28, 1995   Address: [address] [place], [place] [address]   Sex: Male   Rx Written Rx Dispensed Drug Quantity Days Supply Prescriber Name Prescriber Dea # Payment Method Dispenser   02/17/2021 02/17/2021 oxycodone hcl (ir) 10 mg tab  [address] Jodi Geralds [id number] Other [facility]   02/06/2021 02/06/2021 oxycodone hcl (ir) 10 mg tab  [address] Jodi Geralds [id number] Other [facility]   01/23/2021 01/23/2021 oxycodone hcl (ir) 10 mg tab  [address] Jodi Geralds [id number] Other [facility]   01/09/2021 01/09/2021 oxycodone hcl (ir) 10 mg tab  84 14 Jodi Geralds [id number] Other [facility]   12/27/2020 12/27/2020 oxycodone hcl (ir) 10 mg tab  84 14 Jodi Geralds [id number] Other [facility]   12/13/2020 12/14/2020 oxycodone hcl (ir) 10 mg tab  84 14 Jodi Geralds [id number] Other [facility]   11/29/2020 11/30/2020 oxycodone hcl (ir) 10 mg tab  84 14 Jodi Geralds [id number] Other [facility]   11/14/2020 11/15/2020 oxycodone hcl (ir) 10 mg tab  84 14 Jodi Geralds [id number] Other [facility]   11/01/2020 11/01/2020 oxycodone hcl (ir) 10 mg tab  84 14 [name], [name] MD [id number] Other [facility]   10/18/2020 10/18/2020 oxycodone hcl (ir) 10 mg tab  84 14 [name], [name] [id number] Other [facility]   10/04/2020 10/04/2020 oxycodone hcl (ir) 10 mg tab  84 14 Jodi Geralds [id number] Other [facility]   09/20/2020 09/20/2020 oxycodone hcl (ir) 10 mg tab  84 14 [name], [name] [id number] Other [facility]   09/08/2020 09/08/2020 oxycodone hcl (ir) 10 mg tab  84 14 Jodi Geralds [id number] Other [facility]   08/26/2020 08/26/2020 oxycodone hcl  (ir) 15 mg tab  42 7 Jodi Geralds [id number] Other [facility]   08/17/2020 08/17/2020 oxycodone hcl (ir) 10 mg tab  36 6 Jodi Geralds [id number] Other [facility]   08/04/2020 08/04/2020 oxycodone hcl 10 mg tablet  84 14 [name], [name] [id number] Other [facility]   07/21/2020 07/21/2020 oxycodone hcl 10 mg tablet  84 14 [name], [name] [id number] Other [facility]   07/08/2020 07/08/2020 oxycodone hcl 10 mg tablet  84 14 [name], [name] [id number] Other [facility]   06/23/2020 06/24/2020 oxycodone hcl 10 mg tablet  84 14 [name], [name] [id number] Other [facility]   06/08/2020 06/08/2020 oxycodone hcl 10 mg tablet  84 14 [name], [name] [id number] Other [facility]   05/24/2020 05/25/2020 oxycodone hcl 10 mg tablet  84 14 [name], [name] [id number] Other [facility]   05/11/2020 05/11/2020 oxycodone hcl 10 mg tablet  84 14 [name], [name] [id number] Other [facility]   04/26/2020 04/26/2020 oxycodone hcl 10 mg tablet  84 14 [name], [name] [id number] Other [facility]   04/12/2020 04/12/2020 oxycodone hcl 10 mg tablet  84 14 Midge Minium [id number] Other [facility]   03/28/2020 03/28/2020  oxycodone hcl 10 mg tablet  84 14 Jodi Geralds [id number] Other [facility]   03/08/2020 03/10/2020 oxycodone hcl 10 mg tablet  [address] Jodi Geralds [id number] Other [facility]

## 2021-03-03 ENCOUNTER — Other Ambulatory Visit: Payer: Self-pay

## 2021-03-06 ENCOUNTER — Other Ambulatory Visit: Payer: Self-pay

## 2021-03-15 ENCOUNTER — Other Ambulatory Visit: Payer: Self-pay | Admitting: Internal Medicine

## 2021-03-16 ENCOUNTER — Encounter: Payer: Self-pay | Admitting: Internal Medicine

## 2021-03-16 NOTE — Telephone Encounter (Signed)
Patient Name: Cameron [name]   Birth Date: 03/01/1996   Address: [address] [place], [place] [address]   Sex: Male   Rx Written Rx Dispensed Drug Quantity Days Supply Prescriber Name Prescriber Dea # Payment Method Dispenser   03/02/2021 03/03/2021 oxycodone hcl (ir) 10 mg tab  84 14 Reinhardt, Kristine M [id number] Other [name]   02/17/2021 02/17/2021 oxycodone hcl (ir) 10 mg tab  84 14 Reinhardt, Kristine M [id number] Other [name]   02/06/2021 02/06/2021 oxycodone hcl (ir) 10 mg tab  84 14 Reinhardt, Kristine M [id number] Other [name]   01/23/2021 01/23/2021 oxycodone hcl (ir) 10 mg tab  84 14 Reinhardt, Kristine M [id number] Other [name]   01/09/2021 01/09/2021 oxycodone hcl (ir) 10 mg tab  84 14 Reinhardt, Kristine M [id number] Other [name]   12/27/2020 12/27/2020 oxycodone hcl (ir) 10 mg tab  84 14 Reinhardt, Kristine M [id number] Other [name]   12/13/2020 12/14/2020 oxycodone hcl (ir) 10 mg tab  84 14 Reinhardt, Kristine M [id number] Other [name]   11/29/2020 11/30/2020 oxycodone hcl (ir) 10 mg tab  84 14 Reinhardt, Kristine M [id number] Other [name]   11/14/2020 11/15/2020 oxycodone hcl (ir) 10 mg tab  84 14 Reinhardt, Kristine M [id number] Other [name]   11/01/2020 11/01/2020 oxycodone hcl (ir) 10 mg tab  84 14 [name], [name] [id number] Other [name]   10/18/2020 10/18/2020 oxycodone hcl (ir) 10 mg tab  84 14 [name], [name] [id number] Other [name]   10/04/2020 10/04/2020 oxycodone hcl (ir) 10 mg tab  84 14 Reinhardt, Kristine M [id number] Other [name]   09/20/2020 09/20/2020 oxycodone hcl (ir) 10 mg tab  84 14 [name], [name] [id number] Other [name]   09/08/2020 09/08/2020 oxycodone hcl  (ir) 10 mg tab  84 14 Reinhardt, Kristine M [id number] Other [name]   08/26/2020 08/26/2020 oxycodone hcl (ir) 15 mg tab  42 7 Reinhardt, Kristine M [id number] Other [name]   08/17/2020 08/17/2020 oxycodone hcl (ir) 10 mg tab  36 6 Reinhardt, Kristine M [id number] Other [name]   08/04/2020 08/04/2020 oxycodone hcl 10 mg tablet  84 14 [name], [name] [id number] Other [name]   07/21/2020 07/21/2020 oxycodone hcl 10 mg tablet  84 14 [name], [name] [id number] Other [name]   07/08/2020 07/08/2020 oxycodone hcl 10 mg tablet  84 14 [name], [name] [id number] Other [name]   06/23/2020 06/24/2020 oxycodone hcl 10 mg tablet  84 14 [name], [name] [id number] Other [name]   06/08/2020 06/08/2020 oxycodone hcl 10 mg tablet  84 14 [name], [name] [id number] Other [name]   05/24/2020 05/25/2020 oxycodone hcl 10 mg tablet  84 14 [name], [name] [id number] Other [name]   05/11/2020 05/11/2020 oxycodone hcl 10 mg tablet  84 14 [name], [name] [id number] Other [name]   04/26/2020 04/26/2020 oxycodone hcl 10 mg tablet  84 14 [name], [name] [id number] Other [name]     04/12/2020 04/12/2020 oxycodone hcl (ir) 10 mg tab  84 14 Midge Minium [id number] Other [facility]   03/28/2020 03/28/2020 oxycodone hcl 10 mg tablet  [address] Jodi Geralds [id number] Other [facility]

## 2021-03-17 ENCOUNTER — Encounter: Payer: Self-pay | Admitting: Internal Medicine

## 2021-03-17 ENCOUNTER — Other Ambulatory Visit: Payer: Self-pay

## 2021-03-17 MED ORDER — OXYCODONE HCL 10 MG PO TABS *I*
10.0000 mg | ORAL_TABLET | ORAL | 0 refills | Status: DC | PRN
Start: 2021-03-17 — End: 2021-03-29
  Filled 2021-03-17: qty 84, 14d supply, fill #0

## 2021-03-17 NOTE — Telephone Encounter (Signed)
Patient Name: Cameron [name]   Birth Date: 1995-11-08   Address: [address] [place], [place] [address]   Sex: Male   Rx Written Rx Dispensed Drug Quantity Days Supply Prescriber Name Prescriber Dea # Payment Method Dispenser   03/02/2021 03/03/2021 oxycodone hcl (ir) 10 mg tab  [address] Jodi Geralds [name] Other [facility]   02/17/2021 02/17/2021 oxycodone hcl (ir) 10 mg tab  [address] Jodi Geralds [id number] Other [facility]   02/06/2021 02/06/2021 oxycodone hcl (ir) 10 mg tab  [address] Jodi Geralds [id number] Other [facility]   01/23/2021 01/23/2021 oxycodone hcl (ir) 10 mg tab  84 14 Jodi Geralds [id number] Other [facility]   01/09/2021 01/09/2021 oxycodone hcl (ir) 10 mg tab  84 14 Jodi Geralds [id number] Other [facility]   12/27/2020 12/27/2020 oxycodone hcl (ir) 10 mg tab  84 14 Jodi Geralds [id number] Other [facility]   12/13/2020 12/14/2020 oxycodone hcl (ir) 10 mg tab  84 14 Jodi Geralds [id number] Other [facility]   11/29/2020 11/30/2020 oxycodone hcl (ir) 10 mg tab  84 14 Jodi Geralds [id number] Other [facility]   11/14/2020 11/15/2020 oxycodone hcl (ir) 10 mg tab  84 14 Jodi Geralds [id number] Other [facility]   11/01/2020 11/01/2020 oxycodone hcl (ir) 10 mg tab  84 14 [name], [name] [id number] Other [facility]   10/18/2020 10/18/2020 oxycodone hcl (ir) 10 mg tab  84 14 [name], [name] [id number] Other [facility]   10/04/2020 10/04/2020 oxycodone hcl (ir) 10 mg tab  84 14 Jodi Geralds [id number] Other [facility]   09/20/2020 09/20/2020 oxycodone hcl (ir) 10 mg tab  84 14 [name], [name] [id number] Other [facility]   09/08/2020 09/08/2020 oxycodone hcl  (ir) 10 mg tab  84 14 Jodi Geralds [id number] Other [facility]   08/26/2020 08/26/2020 oxycodone hcl (ir) 15 mg tab  42 7 Jodi Geralds [id number] Other [facility]   08/17/2020 08/17/2020 oxycodone hcl (ir) 10 mg tab  36 6 Jodi Geralds [id number] Other [facility]   08/04/2020 08/04/2020 oxycodone hcl 10 mg tablet  84 14 [name], [name] [id number] Other [facility]   07/21/2020 07/21/2020 oxycodone hcl 10 mg tablet  84 14 [name], [name] [id number] Other [facility]   07/08/2020 07/08/2020 oxycodone hcl 10 mg tablet  84 14 [name], [name] [id number] Other [facility]   06/23/2020 06/24/2020 oxycodone hcl 10 mg tablet  84 14 [name], [name] [id number] Other [facility]   06/08/2020 06/08/2020 oxycodone hcl 10 mg tablet  84 14 [name], [name] [id number] Other [facility]   05/24/2020 05/25/2020 oxycodone hcl 10 mg tablet  84 14 [name], [name] [id number] Other [facility]   05/11/2020 05/11/2020 oxycodone hcl 10 mg tablet  84 14 [name], [name] [id number] Other [facility]   04/26/2020 04/26/2020 oxycodone hcl 10 mg tablet  84 14 [name], [name] MD [id number] Other [facility]  04/12/2020 04/12/2020 oxycodone hcl (ir) 10 mg tab  84 14 Midge Minium [id number] Other [facility]   03/28/2020 03/28/2020 oxycodone hcl 10 mg tablet  [address] Jodi Geralds [id number] Other [facility]

## 2021-03-17 NOTE — Telephone Encounter (Signed)
Writer spoke to patients mother, French Ana. French Ana requests the patients medication is sent to the pharmacy to day due to patient being out of medication.

## 2021-03-21 ENCOUNTER — Other Ambulatory Visit: Payer: Self-pay

## 2021-03-21 NOTE — Progress Notes (Signed)
CM called Mac to assess for immediate health home care management needs. Rodel denied any immediate needs at this time. CM and Jamesyn discussed upcoming appointments and assessed for potential barriers to care. CM confirmed Cameron [name] had correct appointment dates and times. No additional potential barriers to care present at this time, Javyn verbalized understanding of calling or texting CM if needs change prior to appointments. CM and Leory discussed his current health home care management goals and Trelon declined needing any CM assistance with them at this time. He confirmed work is going well and he has not immediate needs related to social determinants of health. Follow up plan is to check in again via text to assess for needs and provide supports as needed.

## 2021-03-22 ENCOUNTER — Encounter: Payer: Self-pay | Admitting: Internal Medicine

## 2021-03-22 ENCOUNTER — Other Ambulatory Visit: Payer: Self-pay

## 2021-03-22 ENCOUNTER — Ambulatory Visit: Payer: Medicaid Other | Admitting: Internal Medicine

## 2021-03-22 DIAGNOSIS — R519 Headache, unspecified: Secondary | ICD-10-CM

## 2021-03-22 DIAGNOSIS — D571 Sickle-cell disease without crisis: Secondary | ICD-10-CM

## 2021-03-22 NOTE — Progress Notes (Signed)
[facility]  SICK[facility] CELL - ACUTE TELEMEDICINE VISIT      CHIEF COMPLAINT   Follow-up    Subjective   TELEMEDICINE CONSENT     Visit being conducted by Video in lieu of a face to face visit to minimize health care worker and patient exposure to COVID-19, and conserve PPE.    Location of Patient: home  Location of Telemedicine Provider: hospital / clinical location  Other Participants in telemedicine encounter and roles: None    Consent was obtained from the patient to complete this telemedicine visit; including the potential for financial liability.  How did the patient provide consent? Form Completed    SUBJECTIVE     No denies pain. 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 He denies CP, shortness of breath, cough, difficulty breathing, abdominal pain, Nausea / vomiting, diarrhea / constipation. He has had no issues in his medications and does not need refills.     HA in R side forehead   - HA for the past week. Occurs every few hours since Fri night. Can last - 1 hr. He uses tylenol 200mg  - 250mg  1-2 tablets per day. The tylenol was helpful.   - Describes it as an achey pain. No Nausea / vomiting , changes in vision , ear pain / tinnitus, runny nose, nasal congestion.    - Exacerbating factors: bright light / sunlight  - Loud noises do not worsen his HA.    - Does not take sumatriptan for these HA because he is not physically around his supply of sumatriptan.     Medications Reviewed and changes     Current Outpatient Medications:     oxyCODONE (ROXICODONE) 10 mg immediate release tablet, Take 1 tablet (10 mg total) by mouth every 4 hours as needed for Pain.  Max daily dose: 60 mg (6 tablets)., Disp: 84 tablet, Rfl: 0    cyclobenzaprine (FLEXERIL) 5 mg tablet, Take 1 tablet (5 mg total) by mouth 3 times daily as needed for Muscle spasms  for Muscle Spasm, Disp: 60 tablet, Rfl: 0    diclofenac (VOLTAREN) 1 % gel, Apply 4 g to affected area 4 times daily., Disp: 100 g,  Rfl: 2    mesalamine (ASACOL HD) 800 mg tablet, Take 3 tablets (2,400 mg total) by mouth 2 times daily, Disp: 180 tablet, Rfl: 2    SUMAtriptan (IMITREX) 50 MG tablet, Take 1 tablet (50 mg total) by mouth as needed for Migraine Take at onset of headache. May repeat once in 2 hours., Disp: 2 tablet, Rfl: 0    ferrous sulfate 325 (65 FE) MG tablet, Take 1 tablet (325 mg total) by mouth 2 times daily (with meals), Disp: 100 tablet, Rfl: 3    naloxone (NARCAN) 4 mg/0.1 mL nasal spray, Instill 1 spray in 1 nostril once for opioid reversal. Repeat in alternating nostrils with new package every 2-3 minutes until response (Patient not taking: Reported on 03/21/2020), Disp: 2 each, Rfl: 5  Review of Systems   All other systems reviewed and are negative.         Objective   OBJECTIVE   PHYSICAL EXAM  This visit was performed during a pandemic event, and the physical exam was limited to my Video observation of this patient's organ systems and/or body areas.   GEN:  alert, looks comfortable. Speech is clear and cohesive. NAD         Lab results: 09/06/20  03/23/2020  WBC 7.3   Hemoglobin 7.7*   Hematocrit 27*   RBC 4.0*   Platelets 458*          ASSESSMENT / DIAGNOSIS   Diagnoses and all orders for this visit:    Headache  Appears to be a migraine.   Continue sumatriptan as needed  Increase fluids, rest, avoid triggers  Cbc to assess Hct levels and assure this is not contributing     Sickle cell disease  Stable       No follow-ups on file.  -Patient instructed to call if symptoms are not improving or worsening    Electronically signed by Signed: Jodi Geralds, NP on 03/22/2021   [facility]

## 2021-03-22 NOTE — Progress Notes (Signed)
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 - health home care manager  LANGUAGE - english    Recent Screening Results:  PHQ-9:   PHQ 03/22/2021   Over the last two weeks, have you often been bothered by feeling down, depressed or hopeless? -   Over the last two weeks, have you often had little interest or pleasure in doing things? -   PHQ Total Score 0     GAD-7:GAD-7 Score: 0  DAST-10:  How many times in the past year have you used an illegal drug or used a prescription medication for nonmedical reasons?: 0 (03/22/2021  4:04 PM)     AUDIT-C:   Recent Review Flowsheet Data     Audit-C Dates 03/22/2021    Total Score 0         PTC COMM SCORE ONLY 03/22/2021   COMM Score 0     Mental Health Information  Patient Identified Stressors: pt stated not really  Coping: stated doesn't get upset  Strengths: non provided  Current MH Treatment: No  Past MH Treatment: No  Current mediations:   Current Outpatient Medications:     oxyCODONE (ROXICODONE) 10 mg immediate release tablet, Take 1 tablet (10 mg total) by mouth every 4 hours as needed for Pain.  Max daily dose: 60 mg (6 tablets)., Disp: 84 tablet, Rfl: 0    cyclobenzaprine (FLEXERIL) 5 mg tablet, Take 1 tablet (5 mg total) by mouth 3 times daily as needed for Muscle spasms  for Muscle Spasm, Disp: 60 tablet, Rfl: 0    diclofenac (VOLTAREN) 1 % gel, Apply 4 g to affected area 4 times daily., Disp: 100 g, Rfl: 2    mesalamine (ASACOL HD) 800 mg tablet, Take 3 tablets (2,400 mg total) by mouth 2 times daily, Disp: 180 tablet, Rfl: 2    SUMAtriptan (IMITREX) 50 MG tablet, Take 1 tablet (50 mg total) by mouth as needed for Migraine Take at onset of headache. May repeat once  in 2 hours., Disp: 2 tablet, Rfl: 0    ferrous sulfate 325 (65 FE) MG tablet, Take 1 tablet (325 mg total) by mouth 2 times daily (with meals), Disp: 100 tablet, Rfl: 3    naloxone (NARCAN) 4 mg/0.1 mL nasal spray, Instill 1 spray in 1 nostril once for opioid reversal. Repeat in alternating nostrils with new package every 2-3 minutes until response (Patient not taking: Reported on 03/21/2020), Disp: 2 each, Rfl: 5    Additional relevant psych information: na    Biopsychosocial Assessment    Living arrangements/Residential setting: apartment -  rents  Who resides with patient?: other - lives on own  Family/Support System no one  Spiritual, Religious, Cultural Information -   Source(s) of Income: full time employment with [employer]  Education: [place] and entitlements: Insurance - medicaid      Summary:  Probation officer saw patient via telehealth.  Writer met with the patient and performed annual  Comprehensive Mental Health Coordination Assessment.  Eagle Pitta III is aware of writer's role and availability.  Virgilio Broadhead III has support from na.  Resources provided as follows: na.  Plan is agreed upon by Nonah Mattes III.  Writer will review assessment and review appropriate next steps in treatment.           Time Spent w/ patient: 23

## 2021-03-29 ENCOUNTER — Other Ambulatory Visit: Payer: Self-pay | Admitting: Internal Medicine

## 2021-03-29 NOTE — Telephone Encounter (Signed)
Patient Name: Cameron [name]   Birth Date: [birth year]   Address: [address] [place], [place] [address]   Sex: Male   Rx Written Rx Dispensed Drug Quantity Days Supply Prescriber Name Prescriber Dea # Payment Method Dispenser   03/17/2021 03/17/2021 oxycodone hcl (ir) 10 mg tab  [address] Jodi Geralds [id number] Other [facility]   03/02/2021 03/03/2021 oxycodone hcl (ir) 10 mg tab  [address] Jodi Geralds [id number] Other [facility]   02/17/2021 02/17/2021 oxycodone hcl (ir) 10 mg tab  84 14 Jodi Geralds [id number] Other [facility]   02/06/2021 02/06/2021 oxycodone hcl (ir) 10 mg tab  84 14 Jodi Geralds [id number] Other [facility]   01/23/2021 01/23/2021 oxycodone hcl (ir) 10 mg tab  84 14 Jodi Geralds [id number] Other [facility]   01/09/2021 01/09/2021 oxycodone hcl (ir) 10 mg tab  84 14 Jodi Geralds [id number] Other [facility]   12/27/2020 12/27/2020 oxycodone hcl (ir) 10 mg tab  84 14 Jodi Geralds [id number] Other [facility]   12/13/2020 12/14/2020 oxycodone hcl (ir) 10 mg tab  84 14 Jodi Geralds [id number] Other [facility]   11/29/2020 11/30/2020 oxycodone hcl (ir) 10 mg tab  84 14 Jodi Geralds [id number] Other [facility]   11/14/2020 11/15/2020 oxycodone hcl (ir) 10 mg tab  84 14 Jodi Geralds [id number] Other [facility]   11/01/2020 11/01/2020 oxycodone hcl (ir) 10 mg tab  84 14 [name], [name] [id number] Other [facility]   10/18/2020 10/18/2020 oxycodone hcl (ir) 10 mg tab  84 14 [name], [name] [id number] Other [facility]   10/04/2020 10/04/2020 oxycodone hcl (ir) 10 mg tab  84 14 Jodi Geralds [id number] Other [facility]   09/20/2020 09/20/2020 oxycodone hcl  (ir) 10 mg tab  84 14 [name], [name] [id number] Other [facility]   09/08/2020 09/08/2020 oxycodone hcl (ir) 10 mg tab  84 14 Jodi Geralds [id number] Other [facility]   08/26/2020 08/26/2020 oxycodone hcl (ir) 15 mg tab  42 7 Jodi Geralds [id number] Other [facility]   08/17/2020 08/17/2020 oxycodone hcl (ir) 10 mg tab  36 6 Jodi Geralds [id number] Other [facility]   08/04/2020 08/04/2020 oxycodone hcl 10 mg tablet  84 14 [name], [name] [id number] Other [facility]   07/21/2020 07/21/2020 oxycodone hcl 10 mg tablet  84 14 [name], [name] [id number] Other [facility]   07/08/2020 07/08/2020 oxycodone hcl 10 mg tablet  84 14 [name], [name] [id number] Other [facility]   06/23/2020 06/24/2020 oxycodone hcl 10 mg tablet  84 14 [name], [name] [id number] Other [facility]   06/08/2020 06/08/2020 oxycodone hcl 10 mg tablet  84 14 [name], [name] [id number] Other [facility]   05/24/2020 05/25/2020 oxycodone hcl 10 mg tablet  84 14 [name], [name] [id number] Other [facility]   05/11/2020 05/11/2020 oxycodone hcl (ir) 10 mg tab  84 14 [name], [name] MD [id number] Other [facility]  04/26/2020 04/26/2020 oxycodone hcl 10 mg tablet  84 14 PulcinoGwenith Spitz MD [id number] Other [facility]   04/12/2020 04/12/2020 oxycodone hcl (ir) 10 mg tab  84 14 Midge Minium [id number] Other [facility]     * - Drugs marked with an asterisk are compound drugs. If the compound drug is made up of more than one controlled substance, then each controlled substance will be a separate row in the table.

## 2021-03-31 ENCOUNTER — Other Ambulatory Visit: Payer: Self-pay

## 2021-03-31 MED ORDER — OXYCODONE HCL 10 MG PO TABS *I*
10.0000 mg | ORAL_TABLET | ORAL | 0 refills | Status: DC | PRN
Start: 2021-03-31 — End: 2021-04-13
  Filled 2021-03-31: qty 84, 14d supply, fill #0

## 2021-04-03 ENCOUNTER — Other Ambulatory Visit: Payer: Self-pay

## 2021-04-13 ENCOUNTER — Other Ambulatory Visit: Payer: Self-pay | Admitting: Internal Medicine

## 2021-04-14 NOTE — Telephone Encounter (Signed)
Patient Name: Cameron [name]   Birth Date: [birth year]   Address: [address] [place], [place] [address]   Sex: Male   Rx Written Rx Dispensed Drug Quantity Days Supply Prescriber Name Prescriber Dea # Payment Method Dispenser   03/31/2021 04/03/2021 oxycodone hcl (ir) 10 mg tab  [address] Jodi Geralds [id number] Other [facility]   03/17/2021 03/17/2021 oxycodone hcl (ir) 10 mg tab  [address] Jodi Geralds [id number] Other [facility]   03/02/2021 03/03/2021 oxycodone hcl (ir) 10 mg tab  84 14 Jodi Geralds [id number] Other [facility]   02/17/2021 02/17/2021 oxycodone hcl (ir) 10 mg tab  84 14 Jodi Geralds [id number] Other [facility]   02/06/2021 02/06/2021 oxycodone hcl (ir) 10 mg tab  84 14 Jodi Geralds [id number] Other [facility]   01/23/2021 01/23/2021 oxycodone hcl (ir) 10 mg tab  84 14 Jodi Geralds [id number] Other [facility]   01/09/2021 01/09/2021 oxycodone hcl (ir) 10 mg tab  84 14 Jodi Geralds [id number] Other [facility]   12/27/2020 12/27/2020 oxycodone hcl (ir) 10 mg tab  84 14 Jodi Geralds [id number] Other [facility]   12/13/2020 12/14/2020 oxycodone hcl (ir) 10 mg tab  84 14 Jodi Geralds [id number] Other [facility]   11/29/2020 11/30/2020 oxycodone hcl (ir) 10 mg tab  84 14 Jodi Geralds [id number] Other [facility]   11/14/2020 11/15/2020 oxycodone hcl (ir) 10 mg tab  84 14 Jodi Geralds [id number] Other [facility]   11/01/2020 11/01/2020 oxycodone hcl (ir) 10 mg tab  84 14 [name], [name] [id number] Other [facility]   10/18/2020 10/18/2020 oxycodone hcl (ir) 10 mg tab  84 14 [name], [name] [id number] Other [facility]   10/04/2020 10/04/2020 oxycodone hcl  (ir) 10 mg tab  84 14 Jodi Geralds [id number] Other [facility]   09/20/2020 09/20/2020 oxycodone hcl (ir) 10 mg tab  84 14 [name], [name] [id number] Other [facility]   09/08/2020 09/08/2020 oxycodone hcl (ir) 10 mg tab  84 14 Jodi Geralds [id number] Other [facility]   08/26/2020 08/26/2020 oxycodone hcl (ir) 15 mg tab  42 7 Jodi Geralds [id number] Other [facility]   08/17/2020 08/17/2020 oxycodone hcl (ir) 10 mg tab  36 6 Jodi Geralds [id number] Other [facility]   08/04/2020 08/04/2020 oxycodone hcl 10 mg tablet  84 14 [name], [name] [id number] Other [facility]   07/21/2020 07/21/2020 oxycodone hcl 10 mg tablet  84 14 [name], [name] [id number] Other [facility]   07/08/2020 07/08/2020 oxycodone hcl 10 mg tablet  84 14 [name], [name] [id number] Other [facility]   06/23/2020 06/24/2020 oxycodone hcl 10 mg tablet  84 14 [name], [name] [id number] Other [facility]   06/08/2020 06/08/2020 oxycodone hcl 10 mg tablet  84 14 [name], [name] [id number] Other [facility]   05/24/2020 05/25/2020 oxycodone hcl 10 mg tablet  84 14 [name], [name] MD [id number] Other [facility]  05/11/2020 05/11/2020 oxycodone hcl (ir) 10 mg tab  84 14 [name], [name] MD [id number] Other [facility]   04/26/2020 04/26/2020 oxycodone hcl 10 mg tablet  84 14 [name], [name] MD [id number] Other [facility]     * - Drugs marked with an asterisk are compound drugs. If the compound drug is made up of more than one controlled substance, then each controlled substance will be a separate row in the table.

## 2021-04-17 ENCOUNTER — Encounter: Payer: Self-pay | Admitting: Internal Medicine

## 2021-04-17 ENCOUNTER — Other Ambulatory Visit: Payer: Self-pay

## 2021-04-17 MED ORDER — OXYCODONE HCL 10 MG PO TABS *I*
10.0000 mg | ORAL_TABLET | ORAL | 0 refills | Status: DC | PRN
Start: 2021-04-17 — End: 2021-04-30
  Filled 2021-04-17: qty 84, 14d supply, fill #0

## 2021-04-18 ENCOUNTER — Other Ambulatory Visit: Payer: Self-pay

## 2021-04-21 ENCOUNTER — Encounter: Payer: Self-pay | Admitting: Gastroenterology

## 2021-04-21 ENCOUNTER — Other Ambulatory Visit: Payer: Self-pay

## 2021-04-21 ENCOUNTER — Encounter: Payer: Self-pay | Admitting: Primary Care

## 2021-04-21 NOTE — Progress Notes (Signed)
After several failed contact attempts CM spoke to [facility] interdisciplinary team regarding Cameron [name]. CM discussed and consulted with [facility] nursing and medical staff to inquire about any possible communications he has made with the office regarding his care or health home care management needs. Interdisciplinary team confirmed that he has only contacted the office for medication refills. He did not request to speak to CM or acknowledge any missed calls and voicemails from CM when communicating with interdisciplinary team. CM continued to make reasonable efforts to contact Cameron [name] for the month of July. CM attempted to contact Cameron [name] by phone call on 7/27, 7/28/ and 7/29. CM also sent mychart messages on 7/26 and 7/27. CM left several voicemails but no contact has been made as a result. Cameron [name] read the mycharts from CM and did not respond. M will remain available to all members of care team as needed. Follow up plan is to conduct a case review with PCP to review health home needs and eligibility requirements.

## 2021-04-30 ENCOUNTER — Other Ambulatory Visit: Payer: Self-pay | Admitting: Pediatrics

## 2021-05-01 ENCOUNTER — Encounter: Payer: Self-pay | Admitting: Internal Medicine

## 2021-05-01 ENCOUNTER — Other Ambulatory Visit: Payer: Self-pay

## 2021-05-01 MED ORDER — OXYCODONE HCL 10 MG PO TABS *I*
10.0000 mg | ORAL_TABLET | ORAL | 0 refills | Status: DC | PRN
Start: 2021-05-01 — End: 2021-05-16
  Filled 2021-05-01: qty 84, 14d supply, fill #0

## 2021-05-01 NOTE — Telephone Encounter (Signed)
Patient Name: Cameron [name]   Birth Date: Mar 16, 1996   Address: [address] [place], [place] [address]   Sex: Male   Rx Written Rx Dispensed Drug Quantity Days Supply Prescriber Name Prescriber Dea # Payment Method Dispenser   04/17/2021 04/17/2021 oxycodone hcl (ir) 10 mg tab  84 14 Midge Minium [id number] Other [facility]   03/31/2021 04/03/2021 oxycodone hcl (ir) 10 mg tab  [address] Jodi Geralds [id number] Other [facility]   03/17/2021 03/17/2021 oxycodone hcl (ir) 10 mg tab  [address] Jodi Geralds [id number] Other [facility]   03/02/2021 03/03/2021 oxycodone hcl (ir) 10 mg tab  84 14 Jodi Geralds [id number] Other [facility]   02/17/2021 02/17/2021 oxycodone hcl (ir) 10 mg tab  84 14 Jodi Geralds [id number] Other [facility]   02/06/2021 02/06/2021 oxycodone hcl (ir) 10 mg tab  84 14 Jodi Geralds [id number] Other [facility]   01/23/2021 01/23/2021 oxycodone hcl (ir) 10 mg tab  84 14 Jodi Geralds [id number] Other [facility]   01/09/2021 01/09/2021 oxycodone hcl (ir) 10 mg tab  84 14 Jodi Geralds [id number] Other [facility]   12/27/2020 12/27/2020 oxycodone hcl (ir) 10 mg tab  84 14 Jodi Geralds [id number] Other [facility]   12/13/2020 12/14/2020 oxycodone hcl (ir) 10 mg tab  84 14 Jodi Geralds [id number] Other [facility]   11/29/2020 11/30/2020 oxycodone hcl (ir) 10 mg tab  84 14 Jodi Geralds [id number] Other [facility]   11/14/2020 11/15/2020 oxycodone hcl (ir) 10 mg tab  84 14 Jodi Geralds [id number] Other [facility]   11/01/2020 11/01/2020 oxycodone hcl (ir) 10 mg tab  84 14 [name], [name] [id number] Other [facility]   10/18/2020 10/18/2020 oxycodone hcl (ir) 10 mg  tab  84 14 [name], [name] [id number] Other [facility]   10/04/2020 10/04/2020 oxycodone hcl (ir) 10 mg tab  84 14 Jodi Geralds [id number] [facility]   09/20/2020 09/20/2020 oxycodone hcl (ir) 10 mg tab  84 14 [name], [name] [id number] Other [facility]   09/08/2020 09/08/2020 oxycodone hcl (ir) 10 mg tab  84 14 Jodi Geralds [id number] [facility]   08/26/2020 08/26/2020 oxycodone hcl (ir) 15 mg tab  42 7 Jodi Geralds [id number] Other [facility]   08/17/2020 08/17/2020 oxycodone hcl (ir) 10 mg tab  36 6 Jodi Geralds [id number] [facility]   08/04/2020 08/04/2020 oxycodone hcl 10 mg tablet  84 14 [name], [name] [id number] Other [facility]   07/21/2020 07/21/2020 oxycodone hcl 10 mg tablet  84 14 [name], [name] [id number] Other [facility]   07/08/2020 07/08/2020 oxycodone hcl 10 mg tablet  84 14 [name], [name] [id number] Other [facility]   06/23/2020 06/24/2020 oxycodone hcl 10 mg tablet  84 14 [name], [name] [id number] Other [facility]   06/08/2020 06/08/2020 oxycodone hcl 10 mg tablet  84 14 [name], [name] MD [id number] Other [facility]   05/24/2020  05/25/2020 oxycodone hcl 10 mg tablet  84 14 PulcinoGwenith Spitz MD [id number] Other [facility]   05/11/2020 05/11/2020 oxycodone hcl (ir) 10 mg tab  84 14 [name], [name] MD [id number] Other [facility]

## 2021-05-01 NOTE — Telephone Encounter (Signed)
This report was requested by: Janyce Llanos   Reference #: [id number]   Others' Prescriptions  Patient Name: Cameron [name]   Birth Date: [birth year]   Address: [address] [place], [place] [address]   Sex: Male   Rx Written Rx Dispensed Drug Quantity Days Supply Prescriber Name Prescriber Dea # Payment Method Dispenser   04/17/2021 04/17/2021 oxycodone hcl (ir) 10 mg tab  84 14 Midge Minium [id number] Other [facility]   03/31/2021 04/03/2021 oxycodone hcl (ir) 10 mg tab  [address] Jodi Geralds [id number] Other [facility]   03/17/2021 03/17/2021 oxycodone hcl (ir) 10 mg tab  [address] Jodi Geralds [id number] Other [facility]   03/02/2021 03/03/2021 oxycodone hcl (ir) 10 mg tab  84 14 Jodi Geralds [id number] [facility]   02/17/2021 02/17/2021 oxycodone hcl (ir) 10 mg tab  84 14 Jodi Geralds [id number] [facility]   02/06/2021 02/06/2021 oxycodone hcl (ir) 10 mg tab  84 14 Jodi Geralds [id number] [facility]   01/23/2021 01/23/2021 oxycodone hcl (ir) 10 mg tab  84 14 Jodi Geralds [id number] [facility]   01/09/2021 01/09/2021 oxycodone hcl (ir) 10 mg tab  84 14 Jodi Geralds [id number] [facility]   12/27/2020 12/27/2020 oxycodone hcl (ir) 10 mg tab  84 14 Jodi Geralds [id number] [facility]   12/13/2020 12/14/2020 oxycodone hcl (ir) 10 mg tab  84 14 Jodi Geralds [id number] [facility]   11/29/2020 11/30/2020 oxycodone hcl (ir) 10 mg tab  84 14 Jodi Geralds [id number] [facility]   11/14/2020 11/15/2020 oxycodone hcl (ir) 10 mg tab  84 14 Jodi Geralds [id number] [facility]   11/01/2020 11/01/2020 oxycodone hcl (ir) 10 mg tab  84 14 [name], Tiffany L  MD [id number] Other [facility]   10/18/2020 10/18/2020 oxycodone hcl (ir) 10 mg tab  84 14 [name], [name] [id number] Other [facility]   10/04/2020 10/04/2020 oxycodone hcl (ir) 10 mg tab  84 14 Jodi Geralds [id number] [facility]   09/20/2020 09/20/2020 oxycodone hcl (ir) 10 mg tab  84 14 [name], [name] [id number] Other [facility]   09/08/2020 09/08/2020 oxycodone hcl (ir) 10 mg tab  84 14 Jodi Geralds [id number] [facility]   08/26/2020 08/26/2020 oxycodone hcl (ir) 15 mg tab  42 7 Jodi Geralds [id number] [facility]   08/17/2020 08/17/2020 oxycodone hcl (ir) 10 mg tab  36 6 Jodi Geralds [id number] Other [facility]   08/04/2020 08/04/2020 oxycodone hcl 10 mg tablet  84 14 [name], [name] [id number] Other [facility]   07/21/2020 07/21/2020 oxycodone hcl 10 mg tablet  84 14 [name], [name] [id number] [facility]   07/08/2020 07/08/2020 oxycodone hcl 10 mg tablet  84 14 [name], [name] [id number] Other [facility]   06/23/2020 06/24/2020 oxycodone hcl 10 mg tablet  84 14 [name], [name] MD [id number] [facility]   06/08/2020 06/08/2020 oxycodone hcl 10 mg  tablet  84 14 Benay Pillow MD [id number] [facility]   05/24/2020 05/25/2020 oxycodone hcl 10 mg tablet  84 14 [name], [name] MD [id number] Other [facility]   05/11/2020 05/11/2020 oxycodone hcl (ir) 10 mg tab  84 14 [name], [name] MD [id number] Other [facility]     * - Drugs marked with an asterisk are compound drugs. If the compound drug is made up of more than one controlled substance, then each controlled substance will be a separate

## 2021-05-02 ENCOUNTER — Other Ambulatory Visit: Payer: Self-pay

## 2021-05-04 ENCOUNTER — Other Ambulatory Visit: Payer: Self-pay

## 2021-05-04 NOTE — Progress Notes (Signed)
Person CM attempted to contact: CM attempted to contact Cameron [name] by phone call on 8/11.     Reason: CM called to assess for immediate needs, determine health home care management goals, and discuss engagement requirements for the health home program.       Result: CM unable to reach Cameron [name] by phone call and left a voice mail requesting a call back.   Follow Up Plan: CM will continue outreach efforts for the same reasons. CM will remain available to all members of care team as needed. CM will schedule a case review to be conduced the [facility] supervisors and [facility] providers to discuss strategies for connecting, identify barriers to connecting, and review ability to remain enrolled in health home care management.

## 2021-05-05 ENCOUNTER — Telehealth: Payer: Self-pay | Admitting: Primary Care

## 2021-05-05 NOTE — Telephone Encounter (Signed)
05/05/2021    Writer printed and placed Dr. [name]'s departure letter in mail to be sent via interdepartmental mail([employer]) to patients home address. [facility] contact information provided for any patient questions or concerns.

## 2021-05-08 ENCOUNTER — Other Ambulatory Visit: Payer: Self-pay

## 2021-05-08 NOTE — Progress Notes (Signed)
CM attempted to contact [place] on the mobile phone number provided in his chart. On 8/10, 8/11/ and 8/12 CM left a voicemail. All mychart messages CM sent in an attempt to contact were read an remain unanswered at this time. No contact was made at this time and no voicemails were returned to CM. On 8/11 CM discussed his case with health home care management supervisors and reviewed current goals on his care plan have been met with CM assistance over the past year. CM reviewed that he is able to independently contact the [facility] via [place] for needs and does so on a regular basis. On 8/11 CM sent a letter of disenrollment and NOD to [facility] via mychart. If Mehran fails to contact CM, CM will disenroll him from the health home care management program on 8/22 and notify his provider.

## 2021-05-12 ENCOUNTER — Other Ambulatory Visit: Payer: Self-pay | Admitting: Internal Medicine

## 2021-05-12 NOTE — Telephone Encounter (Addendum)
Patient contacted and scheduled for transition consultation with Dr. Windell Moulding.

## 2021-05-15 ENCOUNTER — Encounter: Payer: Self-pay | Admitting: Internal Medicine

## 2021-05-15 ENCOUNTER — Encounter: Payer: Self-pay | Admitting: Primary Care

## 2021-05-15 NOTE — Telephone Encounter (Signed)
Patient Name: Cameron [name]   Birth Date: 1996/08/31   Address: [address] [place], [place] [address]   Sex: Male   Rx Written Rx Dispensed Drug Quantity Days Supply Prescriber Name Prescriber Dea # Payment Method Dispenser   05/01/2021 05/02/2021 oxycodone hcl (ir) 10 mg tab  [address] Jodi Geralds [id number] Other [facility]   04/17/2021 04/17/2021 oxycodone hcl (ir) 10 mg tab  84 14 Midge Minium [id number] Other [facility]   03/31/2021 04/03/2021 oxycodone hcl (ir) 10 mg tab  [address] Jodi Geralds [id number] Other [facility]   03/17/2021 03/17/2021 oxycodone hcl (ir) 10 mg tab  84 14 Jodi Geralds [id number] [facility]   03/02/2021 03/03/2021 oxycodone hcl (ir) 10 mg tab  84 14 Jodi Geralds [id number] [facility]   02/17/2021 02/17/2021 oxycodone hcl (ir) 10 mg tab  84 14 Jodi Geralds [id number] Other [facility]   02/06/2021 02/06/2021 oxycodone hcl (ir) 10 mg tab  84 14 Jodi Geralds [id number] Other [facility]   01/23/2021 01/23/2021 oxycodone hcl (ir) 10 mg tab  84 14 Jodi Geralds [id number] [facility]   01/09/2021 01/09/2021 oxycodone hcl (ir) 10 mg tab  84 14 Jodi Geralds [id number] Other [facility]   12/27/2020 12/27/2020 oxycodone hcl (ir) 10 mg tab  84 14 Jodi Geralds [id number] Other [facility]   12/13/2020 12/14/2020 oxycodone hcl (ir) 10 mg tab  84 14 Jodi Geralds [id number] [facility]   11/29/2020 11/30/2020 oxycodone hcl (ir) 10 mg tab  84 14 Jodi Geralds [id number] [facility]   11/14/2020 11/15/2020 oxycodone hcl (ir) 10 mg tab  84 14 Jodi Geralds [id number] Other [facility]   11/01/2020 11/01/2020 oxycodone hcl (ir) 10 mg  tab  84 14 [name], [name] [id number] Other [facility]   10/18/2020 10/18/2020 oxycodone hcl (ir) 10 mg tab  84 14 [name], [name] [id number] Other [facility]   10/04/2020 10/04/2020 oxycodone hcl (ir) 10 mg tab  84 14 Jodi Geralds [id number] [facility]   09/20/2020 09/20/2020 oxycodone hcl (ir) 10 mg tab  84 14 [name], [name] [id number] Other [facility]   09/08/2020 09/08/2020 oxycodone hcl (ir) 10 mg tab  84 14 Jodi Geralds [id number] [facility]   08/26/2020 08/26/2020 oxycodone hcl (ir) 15 mg tab  42 7 Jodi Geralds [id number] [facility]   08/17/2020 08/17/2020 oxycodone hcl (ir) 10 mg tab  36 6 Jodi Geralds [id number] Other [facility]   08/04/2020 08/04/2020 oxycodone hcl 10 mg tablet  84 14 [name], [name] [id number] Other [facility]   07/21/2020 07/21/2020 oxycodone hcl 10 mg tablet  84 14 [name], [name] [id number] Other [facility]   07/08/2020 07/08/2020 oxycodone hcl 10 mg tablet  84 14 [name], [name] [id number] Other [facility]   06/23/2020 06/24/2020 oxycodone hcl (ir) 10 mg tab  84 14 [name], [name] MD [id number] Other [facility]  06/08/2020 06/08/2020 oxycodone hcl 10 mg tablet  84 14 [name], [name] MD [id number] Other [facility]   05/24/2020 05/25/2020 oxycodone hcl 10 mg tablet  84 14 [name], [name] MD [id number] Other [facility]

## 2021-05-15 NOTE — Telephone Encounter (Signed)
Patient Name: Cameron [name]   Birth Date: August 30, 1996   Address: [address] [place], [place] [address]   Sex: Male   Rx Written Rx Dispensed Drug Quantity Days Supply Prescriber Name Prescriber Dea # Payment Method Dispenser   05/01/2021 05/02/2021 oxycodone hcl (ir) 10 mg tab  [address] Jodi Geralds [id number] Other [facility]   04/17/2021 04/17/2021 oxycodone hcl (ir) 10 mg tab  84 14 Midge Minium [id number] Other [facility]   03/31/2021 04/03/2021 oxycodone hcl (ir) 10 mg tab  [address] Jodi Geralds [id number] Other [facility]   03/17/2021 03/17/2021 oxycodone hcl (ir) 10 mg tab  84 14 Jodi Geralds [id number] [facility]   03/02/2021 03/03/2021 oxycodone hcl (ir) 10 mg tab  84 14 Jodi Geralds [id number] [facility]   02/17/2021 02/17/2021 oxycodone hcl (ir) 10 mg tab  84 14 Jodi Geralds [id number] [facility]   02/06/2021 02/06/2021 oxycodone hcl (ir) 10 mg tab  84 14 Jodi Geralds [id number] Other [facility]   01/23/2021 01/23/2021 oxycodone hcl (ir) 10 mg tab  84 14 Jodi Geralds [id number] [facility]   01/09/2021 01/09/2021 oxycodone hcl (ir) 10 mg tab  84 14 Jodi Geralds [id number] Other [facility]   12/27/2020 12/27/2020 oxycodone hcl (ir) 10 mg tab  84 14 Jodi Geralds [id number] Other [facility]   12/13/2020 12/14/2020 oxycodone hcl (ir) 10 mg tab  84 14 Jodi Geralds [id number] [facility]   11/29/2020 11/30/2020 oxycodone hcl (ir) 10 mg tab  84 14 Jodi Geralds [id number] [facility]   11/14/2020 11/15/2020 oxycodone hcl (ir) 10 mg tab  84 14 Jodi Geralds [id number] [facility]   11/01/2020 11/01/2020 oxycodone hcl (ir) 10 mg  tab  84 14 [name], [name] [id number] Other [facility]   10/18/2020 10/18/2020 oxycodone hcl (ir) 10 mg tab  84 14 [name], [name] [id number] Other [facility]   10/04/2020 10/04/2020 oxycodone hcl (ir) 10 mg tab  84 14 Jodi Geralds [id number] Other [facility]   09/20/2020 09/20/2020 oxycodone hcl (ir) 10 mg tab  84 14 [name], [name] [id number] Other [facility]   09/08/2020 09/08/2020 oxycodone hcl (ir) 10 mg tab  84 14 Jodi Geralds [id number] Other [facility]   08/26/2020 08/26/2020 oxycodone hcl (ir) 15 mg tab  42 7 Jodi Geralds [id number] Other [facility]   08/17/2020 08/17/2020 oxycodone hcl (ir) 10 mg tab  36 6 Jodi Geralds [id number] Other [facility]   08/04/2020 08/04/2020 oxycodone hcl 10 mg tablet  84 14 [name], [name] [id number] Other [facility]   07/21/2020 07/21/2020 oxycodone hcl 10 mg tablet  84 14 [name], [name] [id number] Other [facility]   07/08/2020 07/08/2020 oxycodone hcl 10 mg tablet  84 14 [name], [name] [id number] Other [facility]   06/23/2020 06/24/2020 oxycodone hcl (ir) 10 mg tab  84 14 [name], [name] MD [id number] Other [facility]  06/08/2020 06/08/2020 oxycodone hcl 10 mg tablet  84 14 [name], [name] MD [id number] Other [facility]   05/24/2020 05/25/2020 oxycodone hcl 10 mg tablet  84 14 [name], [name] MD [id number] Other [facility]     * - Drugs marked with an asterisk are compound drugs. If the compound drug is made up of more than one controlled substance, then each controlled substance will be a separate row in the table.     2022 [employer]

## 2021-05-16 ENCOUNTER — Encounter: Payer: Self-pay | Admitting: Internal Medicine

## 2021-05-16 ENCOUNTER — Other Ambulatory Visit: Payer: Self-pay

## 2021-05-16 MED ORDER — OXYCODONE HCL 10 MG PO TABS *I*
10.0000 mg | ORAL_TABLET | ORAL | 0 refills | Status: DC | PRN
Start: 2021-05-16 — End: 2021-05-29
  Filled 2021-05-16: qty 84, 14d supply, fill #0

## 2021-05-17 ENCOUNTER — Other Ambulatory Visit: Payer: Self-pay

## 2021-05-17 NOTE — Progress Notes (Signed)
1) Discharge code: [facility], member has graduated from the health home program    2) Summary of reason for discharge: CM contacted [facility] via [place] messaging to review readiness to end care management services and review progress which has improved through participation in the program. Cameron [name] has been able to successfully self-manage and monitor his sickle cell disease and conditions over the past several months as evidenced by remining engaged with providers and free of hospitalizations for over one year. He has remained employed and maintained stable housing over the past year as well without CM assistance or barriers to goal completion. He has maintained social supports through family and other members of his life. All goals and objective on the health home care management plan have been addressed and met at this time. Cameron [name] declined to present any new goals or identify any new needs at this time.     3) Date discharge discussed with supervisor: CM discussed graduation and discharge on 05/16/2021.    4) Care Manager notified PCP (and other care team members as applicable) of disenrollment and attempted to conduct a case review. (Yes/No - if no, explain) Yes, members of care team have been notified by CM as of 8/23    5) Option 2: client is requesting disenrollment  a) Clients reason and plan for disenrollment: Cameron [name] declined interest in continuing health home care management services through the [place].   b) Date the withdrawal of consent letter was sent to client: Letters sent to [facility] on 05/04/2021.    6) Copies of Crisis Plan and Care Plan were offered to the client.    7) Notified referral coordinator of disenrollment who is responsible for notifying the [facility].

## 2021-05-25 ENCOUNTER — Other Ambulatory Visit: Payer: Self-pay

## 2021-05-26 ENCOUNTER — Ambulatory Visit: Payer: Medicaid Other

## 2021-05-26 ENCOUNTER — Ambulatory Visit: Payer: Medicaid Other | Admitting: Primary Care

## 2021-05-29 ENCOUNTER — Other Ambulatory Visit: Payer: Self-pay | Admitting: Primary Care

## 2021-05-29 ENCOUNTER — Encounter: Payer: Self-pay | Admitting: Primary Care

## 2021-05-30 NOTE — Telephone Encounter (Signed)
Patient Name: Cameron [name]   Birth Date: [birth year]   Address: [address] [place], [place] [address]   Sex: Male   Rx Written Rx Dispensed Drug Quantity Days Supply Prescriber Name Prescriber Dea # Payment Method Dispenser   05/16/2021 05/17/2021 oxycodone hcl (ir) 10 mg tab  [address] [name] MD [id number] Other [facility]   05/01/2021 05/02/2021 oxycodone hcl (ir) 10 mg tab  84 14 Jodi Geralds [id number] Other [facility]   04/17/2021 04/17/2021 oxycodone hcl (ir) 10 mg tab  84 14 Midge Minium [id number] Other [facility]   03/31/2021 04/03/2021 oxycodone hcl (ir) 10 mg tab  84 14 Jodi Geralds [id number] Other [facility]   03/17/2021 03/17/2021 oxycodone hcl (ir) 10 mg tab  84 14 Jodi Geralds [id number] Other [facility]   03/02/2021 03/03/2021 oxycodone hcl (ir) 10 mg tab  84 14 Jodi Geralds [id number] Other [facility]   02/17/2021 02/17/2021 oxycodone hcl (ir) 10 mg tab  84 14 Jodi Geralds [id number] Other [facility]   02/06/2021 02/06/2021 oxycodone hcl (ir) 10 mg tab  84 14 Jodi Geralds [id number] Other [facility]   01/23/2021 01/23/2021 oxycodone hcl (ir) 10 mg tab  84 14 Jodi Geralds [id number] Other [facility]   01/09/2021 01/09/2021 oxycodone hcl (ir) 10 mg tab  84 14 Jodi Geralds [id number] Other [facility]   12/27/2020 12/27/2020 oxycodone hcl (ir) 10 mg tab  84 14 Jodi Geralds [id number] Other [facility]   12/13/2020 12/14/2020 oxycodone hcl (ir) 10 mg tab  84 14 Jodi Geralds [id number] Other [facility]   11/29/2020 11/30/2020 oxycodone hcl (ir) 10 mg tab  84 14 Jodi Geralds [id number] Other [facility]   11/14/2020 11/15/2020 oxycodone hcl (ir) 10 mg  tab  84 14 Jodi Geralds [id number] Other [facility]   11/01/2020 11/01/2020 oxycodone hcl (ir) 10 mg tab  84 14 [name], [name] [id number] Other [facility]   10/18/2020 10/18/2020 oxycodone hcl (ir) 10 mg tab  84 14 [name], [name] [id number] Other [facility]   10/04/2020 10/04/2020 oxycodone hcl (ir) 10 mg tab  84 14 Jodi Geralds [id number] Other [facility]   09/20/2020 09/20/2020 oxycodone hcl (ir) 10 mg tab  84 14 [name], [name] [id number] Other [facility]   09/08/2020 09/08/2020 oxycodone hcl (ir) 10 mg tab  84 14 Jodi Geralds [id number] Other [facility]   08/26/2020 08/26/2020 oxycodone hcl (ir) 15 mg tab  42 7 Jodi Geralds [id number] Other [facility]   08/17/2020 08/17/2020 oxycodone hcl (ir) 10 mg tab  36 6 Jodi Geralds [id number] Other [facility]   08/04/2020 08/04/2020 oxycodone hcl 10 mg tablet  84 14 [name], [name] [id number] Other [facility]   07/21/2020 07/21/2020 oxycodone hcl 10 mg tablet  84 14 [name], [name] [id number] Other [facility]   07/08/2020 07/08/2020 oxycodone hcl 10 mg tablet  84 14 [name], [name] MD [id number] Other [facility]  06/23/2020 06/24/2020 oxycodone hcl (ir) 10 mg tab  84 14 [name], [name] MD [id number] Other [facility]   06/08/2020 06/08/2020 oxycodone hcl 10 mg tablet  84 14 [name], [name] MD [id number] Other [facility]     * - Drugs marked with an asterisk are compound drugs. If the compound drug is made up of more than one controlled substance, then each controlled substance will be a separate row in the table.

## 2021-05-31 ENCOUNTER — Encounter: Payer: Self-pay | Admitting: Internal Medicine

## 2021-05-31 NOTE — Telephone Encounter (Signed)
Medications pended to PCP in separate encounter.     Gary Fleet, RN

## 2021-06-01 ENCOUNTER — Encounter: Payer: Self-pay | Admitting: Primary Care

## 2021-06-01 ENCOUNTER — Other Ambulatory Visit: Payer: Self-pay

## 2021-06-01 ENCOUNTER — Telehealth: Payer: Self-pay | Admitting: Primary Care

## 2021-06-01 ENCOUNTER — Other Ambulatory Visit: Payer: Self-pay | Admitting: Primary Care

## 2021-06-01 MED ORDER — OXYCODONE HCL 10 MG PO TABS *I*
10.0000 mg | ORAL_TABLET | ORAL | 0 refills | Status: DC | PRN
Start: 2021-06-01 — End: 2021-06-14
  Filled 2021-06-01: qty 84, 14d supply, fill #0

## 2021-06-01 MED ORDER — MESALAMINE 800 MG PO TBEC *I*
2400.0000 mg | DELAYED_RELEASE_TABLET | Freq: Two times a day (BID) | ORAL | 2 refills | Status: DC
Start: 2021-06-01 — End: 2021-12-14
  Filled 2021-06-01: qty 180, 30d supply, fill #0
  Filled 2021-07-25: qty 180, 30d supply, fill #1
  Filled 2021-10-18: qty 180, 30d supply, fill #2

## 2021-06-01 NOTE — Telephone Encounter (Signed)
Incoming call from patient asking for hte status on his refills as he's out.  Patient also asked for a doctor's note as he missed work today due to pain.    Writer created letter and routed to provider.

## 2021-06-01 NOTE — Telephone Encounter (Signed)
Please make an appointment in sickle cell clinic for [place] and respond to his MyChart message

## 2021-06-01 NOTE — Telephone Encounter (Signed)
Pt already scheduled for 9/30 in [facility]

## 2021-06-01 NOTE — Telephone Encounter (Signed)
Patient requests Rx orders are sent to the pharmacy today due to the patient being out of medication.

## 2021-06-05 ENCOUNTER — Encounter: Payer: Self-pay | Admitting: Primary Care

## 2021-06-05 ENCOUNTER — Encounter: Payer: Self-pay | Admitting: Internal Medicine

## 2021-06-05 ENCOUNTER — Telehealth: Payer: Self-pay | Admitting: Primary Care

## 2021-06-05 ENCOUNTER — Ambulatory Visit: Payer: Medicaid Other | Admitting: Internal Medicine

## 2021-06-05 DIAGNOSIS — R5381 Other malaise: Secondary | ICD-10-CM

## 2021-06-05 DIAGNOSIS — R509 Fever, unspecified: Secondary | ICD-10-CM

## 2021-06-05 NOTE — Telephone Encounter (Addendum)
Spoke with pt  Agreed it make sense for him to have a video appointment to assess need for PCR covid swab and in order for a work note to be provided  Booked this afternoon over video at 4 pm

## 2021-06-05 NOTE — Progress Notes (Signed)
[facility]  SICKLE CELL - ACUTE VISIT      CHIEF COMPLAINT     Chief Complaint   Patient presents with    Sickle Cell Anemia   with pain    Subjective     SUBJECTIVE     Feeling like he having pains in his back   Fevers up and down  Not sure if its covid or sickle cell  Feeling unwell for past few days  Today is 10x worse than the past few days  Having minor HA as well  Not having rigors  He was sweating this morning  Had covid back in December  Got one covid shot done  No N/V/D  COVID test at home but has not taken it yet  No sore throat, but does endorse a slight cough  No known covid exposure  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  Max daily dose: 60 mg (6 tablets)., Disp: 84 tablet, Rfl: 0    mesalamine (ASACOL HD) 800 mg tablet, Take 3 tablets (2,400 mg total) by mouth 2 times daily, Disp: 180 tablet, Rfl: 2    cyclobenzaprine (FLEXERIL) 5 mg tablet, Take 1 tablet (5 mg total) by mouth 3 times daily as needed for Muscle spasms  for Muscle Spasm, Disp: 60 tablet, Rfl: 0    diclofenac (VOLTAREN) 1 % gel, Apply 4 g to affected area 4 times daily., Disp: 100 g, Rfl: 2    SUMAtriptan (IMITREX) 50 MG tablet, Take 1 tablet (50 mg total) by mouth as needed for Migraine Take at onset of headache. May repeat once in 2 hours., Disp: 2 tablet, Rfl: 0    ferrous sulfate 325 (65 FE) MG tablet, Take 1 tablet (325 mg total) by mouth 2 times daily (with meals), Disp: 100 tablet, Rfl: 3    naloxone (NARCAN) 4 mg/0.1 mL nasal spray, Instill 1  spray in 1 nostril once for opioid reversal. Repeat in alternating nostrils with new package every 2-3 minutes until response (Patient not taking: Reported on 03/21/2020), Disp: 2 each, Rfl: 5  Review of Systems       Objective   OBJECTIVE   There were no vitals taken for this visit.  Physical Exam        Lab results: 09/06/20  0855   WBC 7.3   Hemoglobin 7.7*   Hematocrit 27*   RBC 4.0*   Platelets 458*          ASSESSMENT / DIAGNOSIS   Cameron [name] was seen today for sickle cell anemia.    Diagnoses and all orders for this visit:    Fever, unspecified fever cause  Malaise    No need to obtain xray or direct pt to ED, able to manage at home .    Continue to treat with conservative management     Likely covid or flu,   Patient will do covid test and  report results to [facility]  Given letter for work  Encouraged to take his pain medication as it works well to improve his general welbeing.     Follow up if symptoms worsen or fail to improve.  -Patient instructed to call if symptoms are not improving or worsening    Electronically signed by Signed: Jacki Cones, MD on 06/05/2021   [facility]    I personally spent 30 minutes, on the calendar day of the encounter, including PreVisit- planning with [facility] RN and huddle, Visit, and Post visit work-providing letter to patient via mychart, in the care of this patient.

## 2021-06-05 NOTE — Telephone Encounter (Signed)
Incoming call from pt complaining of alternating chills and sweats, pain in lower back to knees, and migraines since Thursday afternoon and is missing work again today due to sx.    Pt asked for return call and letter excusing him from work today due to pain (send via myc).    Cb# [phone]    Routed to nursing.

## 2021-06-06 NOTE — Addendum Note (Signed)
Addended by: Fortino Sic on: 06/06/2021 10:17 AM     Modules accepted: Kipp Brood

## 2021-06-07 ENCOUNTER — Encounter: Payer: Self-pay | Admitting: Internal Medicine

## 2021-06-07 NOTE — Telephone Encounter (Signed)
Called and spoke with pt  Endorses feeling worse today than yesterday   Does not have a way to check his temperature but has been profusely sweating, feels hot, wakes up drenched with sweat (apartment is air conditioned)  Vomited a few times  Having dizziness  Does not feel well    Writer directing pt to [facility] ED for evaluation  Called pt in to [facility] ED  Will check in tomorrow

## 2021-06-08 ENCOUNTER — Ambulatory Visit
Admission: AD | Admit: 2021-06-08 | Discharge: 2021-06-08 | Disposition: A | Discharge status: Home / Self Care | Payer: Medicaid Other | Source: Ambulatory Visit | Attending: Orthopedic Surgery | Admitting: Orthopedic Surgery

## 2021-06-08 ENCOUNTER — Encounter: Payer: Self-pay | Admitting: Orthopedic Surgery

## 2021-06-08 ENCOUNTER — Emergency Department
Admission: EM | Admit: 2021-06-08 | Discharge: 2021-06-08 | Discharge status: Against Medical Advice | Payer: Medicaid Other | Source: Ambulatory Visit

## 2021-06-08 DIAGNOSIS — M545 Low back pain, unspecified: Secondary | ICD-10-CM | POA: Insufficient documentation

## 2021-06-08 DIAGNOSIS — Z20828 Contact with and (suspected) exposure to other viral communicable diseases: Secondary | ICD-10-CM | POA: Insufficient documentation

## 2021-06-08 DIAGNOSIS — R509 Fever, unspecified: Secondary | ICD-10-CM | POA: Insufficient documentation

## 2021-06-08 DIAGNOSIS — Z20822 Contact with and (suspected) exposure to covid-19: Secondary | ICD-10-CM | POA: Insufficient documentation

## 2021-06-08 MED ORDER — SODIUM CHLORIDE 0.9 % FLUSH FOR PUMPS *I*
0.0000 mL/h | INTRAVENOUS | Status: DC | PRN
Start: 2021-06-08 — End: 2021-06-09

## 2021-06-08 MED ORDER — DEXTROSE 5 % FLUSH FOR PUMPS *I*
0.0000 mL/h | INTRAVENOUS | Status: DC | PRN
Start: 2021-06-08 — End: 2021-06-09

## 2021-06-08 NOTE — UC Provider Note (Signed)
History     Chief Complaint   Patient presents with    Fever     HPI : 25 year old male with a past medical history of sickle cell disease ulcerative colitis, cholelithiasis, primary sclerosing cholangitis presents today with low back pain, body aches, fever, chills, night sweats, cough and 1 episode of vomiting yesterday.  The cough is since resolved.  He did test for COVID at home 2 days ago which was negative.  He was encouraged to come to the hospital or urgent care for further evaluation of the fever.  Patient did go to the [facility] emergency department and waited 5 hours and decided to come here.  He states he has baseline back pain.  He did have a prior MRI many years ago.  Has not had 1 in a long time.  He denies any new injury or trauma to the back.  He denies any bowel or bladder incontinence.  No groin numbness.  No muscle weakness.    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            Past Surgical History:   Procedure Laterality Date    CHOLECYSTECTOMY  2014     Family History   Problem Relation Age of Onset    Other Mother     Sickle cell anemia Sister     Sickle cell anemia Brother     Sickle cell anemia Brother           Social History     Tobacco Use    Smoking status: Never Smoker    Smokeless tobacco: Never Used   Substance Use Topics    Alcohol use: No    Drug use: No     Living Situation     Questions Responses    Patient lives with Family    Homeless No    Caregiver for other family member     External Services     Employment     Domestic Violence Risk                 Review of Systems   Review of Systems    Constitutional: Positive for chills and fever.   HENT: Negative.    Respiratory: Positive for cough. Negative for chest tightness, shortness of breath and wheezing.    Cardiovascular: Negative.    Gastrointestinal: Negative.    Genitourinary: Negative.    Musculoskeletal: Positive for back pain and myalgias.   Skin: Negative.    Neurological: Positive for light-headedness. Negative for dizziness.       Physical Exam   Vital Signs  Vitals:    06/08/21 1757   BP: 117/68   Pulse: 103   Resp: 16   Temp: 36.7 C (98.1 F)   SpO2: 100%     First Pain Reported  0-10 Scale: 6, Pain Location/Orientation: Back, (06/08/21 1754)       Physical Exam  Vitals and nursing note reviewed.   Constitutional:       Appearance: Normal appearance.  HENT:      Head: Normocephalic and atraumatic.   Cardiovascular:      Rate and Rhythm: Normal rate and regular rhythm.      Pulses: Normal pulses.      Heart sounds: Normal heart sounds.   Pulmonary:      Effort: Pulmonary effort is normal.      Breath sounds: Normal breath sounds.   Musculoskeletal:      Cervical back: Neck supple.      Comments: Tenderness to palpation over the lumbar spine.  No paralumbar muscular tenderness.  No palpable muscle spasms.  Negative straight leg raise test.  2+ reflexes.  Muscle strength is 5+/5.  No saddle anesthesia.  Distally neurovascularly intact.   Skin:     General: Skin is warm and dry.   Neurological:      Mental Status: He is alert.          Medical Decision Making        Medical Decision Making  Assessment:    25 year old male presents today with fever, low back pain.  Other symptoms have resolved essentially.  Await COVID testing.  PCP follow-up.    Differential diagnosis:    Viral illness, viral upper respiratory illness, COVID    Plan and Results:    You are seen today for fever and body aches and low back pain.  Continue with your oxycodone.  You can also continue applying heat.  In regards to your fever, Tylenol as needed.  Please make a  follow-up appoint with your primary care doctor.  They may want to do some more blood work.  We did test for COVID.  You are to quarantine until you get your test results back.    Encounter orders    Orders Placed This Encounter      COVID-19 PCR        Independent Review of: chart/prior records    Diagnosis and Disposition:   Fever.  Low back pain.  Patient stable discharged homeFollow-up  Follow-up Information    Schedule an appointment as soon as possible for a visit  with Marchia Meiers, MD.    Specialties: Internal Medicine, Pediatrics  Contact information:  [address]  [address]  [place] [address]  [phone]                        Final Diagnosis  Final diagnoses:   [R50.9] Fever, unspecified fever cause (Primary)   [M54.50] Acute midline low back pain without sciatica         Clelia Croft, PA              Clelia Croft, [place]  06/08/21 1821

## 2021-06-08 NOTE — ED Triage Notes (Addendum)
C/o back pain, sweats,  chills and diaphoresis.  No documented fevers

## 2021-06-08 NOTE — ED Triage Notes (Signed)
Patient at Urgent Care today with complaint of pain in the lower back, body aches, fever, chills, night sweats, cough(gone now), emesis yesterday  Negative COVID test 2 days ago  Pt has hx of sickle cell  Nurses at PCP advised urgent care or ED  Pt went to ED and waited for 5 hours and decided to come to urgent care

## 2021-06-08 NOTE — Discharge Instructions (Signed)
You are seen today for fever and body aches and low back pain.  Continue with your oxycodone.  You can also continue applying heat.  In regards to your fever, Tylenol as needed.  Please make a follow-up appoint with your primary care doctor.  They may want to do some more blood work.  We did test for COVID.  You are to quarantine until you get your test results back.

## 2021-06-09 LAB — COVID-19 NAAT (PCR): COVID-19 NAAT (PCR): NEGATIVE

## 2021-06-14 ENCOUNTER — Other Ambulatory Visit: Payer: Self-pay | Admitting: Primary Care

## 2021-06-14 ENCOUNTER — Encounter: Payer: Self-pay | Admitting: Primary Care

## 2021-06-14 NOTE — Telephone Encounter (Signed)
Patient Name: Cameron [name]   Birth Date: 18-May-1996   Address: [address] [place], [place] [address]   Sex: Male   Rx Written Rx Dispensed Drug Quantity Days Supply Prescriber Name Prescriber Dea # Payment Method Dispenser   06/01/2021 06/01/2021 oxycodone hcl (ir) 10 mg tab  [address] [name] MD [id number] Other [facility]   05/16/2021 05/17/2021 oxycodone hcl (ir) 10 mg tab  84 14 [name] MD [id number] Other [facility]   05/01/2021 05/02/2021 oxycodone hcl (ir) 10 mg tab  84 14 Jodi Geralds [id number] Other [facility]   04/17/2021 04/17/2021 oxycodone hcl (ir) 10 mg tab  84 14 Midge Minium [id number] Other [facility]   03/31/2021 04/03/2021 oxycodone hcl (ir) 10 mg tab  84 14 Jodi Geralds [id number] Other [facility]   03/17/2021 03/17/2021 oxycodone hcl (ir) 10 mg tab  84 14 Jodi Geralds [id number] Other [facility]   03/02/2021 03/03/2021 oxycodone hcl (ir) 10 mg tab  84 14 Jodi Geralds [id number] Other [facility]   02/17/2021 02/17/2021 oxycodone hcl (ir) 10 mg tab  84 14 Jodi Geralds [id number] Other [facility]   02/06/2021 02/06/2021 oxycodone hcl (ir) 10 mg tab  84 14 Jodi Geralds [id number] Other [facility]   01/23/2021 01/23/2021 oxycodone hcl (ir) 10 mg tab  84 14 Jodi Geralds [id number] Other [facility]   01/09/2021 01/09/2021 oxycodone hcl (ir) 10 mg tab  84 14 Jodi Geralds [id number] Other [facility]   12/27/2020 12/27/2020 oxycodone hcl (ir) 10 mg tab  84 14 Jodi Geralds [id number] Other [facility]   12/13/2020 12/14/2020 oxycodone hcl (ir) 10 mg tab  84 14 Jodi Geralds [id number] Other [facility]   11/29/2020 11/30/2020 oxycodone hcl (ir) 10 mg  tab  84 14 Jodi Geralds [id number] Other [facility]   11/14/2020 11/15/2020 oxycodone hcl (ir) 10 mg tab  84 14 Jodi Geralds [id number] Other [facility]   11/01/2020 11/01/2020 oxycodone hcl (ir) 10 mg tab  84 14 [name], [name] [id number] Other [facility]   10/18/2020 10/18/2020 oxycodone hcl (ir) 10 mg tab  84 14 [name], [name] [id number] Other [facility]   10/04/2020 10/04/2020 oxycodone hcl (ir) 10 mg tab  84 14 Jodi Geralds [id number] Other [facility]   09/20/2020 09/20/2020 oxycodone hcl (ir) 10 mg tab  84 14 [name], [name] [id number] Other [facility]   09/08/2020 09/08/2020 oxycodone hcl (ir) 10 mg tab  84 14 Jodi Geralds [id number] Other [facility]   08/26/2020 08/26/2020 oxycodone hcl (ir) 15 mg tab  42 7 Jodi Geralds [id number] Other [facility]   08/17/2020 08/17/2020 oxycodone hcl (ir) 10 mg tab  36 6 Jodi Geralds [id number] Other [facility]   08/04/2020 08/04/2020 oxycodone hcl 10 mg tablet  84 14 [name], [name] [id number] Other [facility]   07/21/2020 07/21/2020 oxycodone hcl 10 mg tablet  84 14 [name], [name] MD [id number] Other [facility]  07/08/2020 07/08/2020 oxycodone hcl 10 mg tablet  84 14 [name], [name] MD [id number] Other [facility]   06/23/2020 06/24/2020 oxycodone hcl (ir) 10 mg tab  84 14 [name], [name] MD [id number] Other [facility]     * - Drugs marked with an asterisk are compound drugs. If the compound drug is made up of more than one controlled substance, then each controlled substance will be a separate row in the table.

## 2021-06-15 ENCOUNTER — Encounter: Payer: Self-pay | Admitting: Internal Medicine

## 2021-06-15 ENCOUNTER — Other Ambulatory Visit: Payer: Self-pay

## 2021-06-15 MED ORDER — OXYCODONE HCL 10 MG PO TABS *I*
10.0000 mg | ORAL_TABLET | ORAL | 0 refills | Status: DC | PRN
Start: 2021-06-15 — End: 2021-06-23
  Filled 2021-06-15: qty 84, 14d supply, fill #0

## 2021-06-15 NOTE — Telephone Encounter (Signed)
Secure chat sent to PCP regarding refill request.     Gary Fleet, RN

## 2021-06-23 ENCOUNTER — Ambulatory Visit: Payer: Medicaid Other | Admitting: Primary Care

## 2021-06-23 ENCOUNTER — Other Ambulatory Visit: Payer: Self-pay

## 2021-06-23 VITALS — BP 125/67 | HR 72 | Temp 97.3°F | Wt 182.6 lb

## 2021-06-23 DIAGNOSIS — D564 Hereditary persistence of fetal hemoglobin [HPFH]: Secondary | ICD-10-CM

## 2021-06-23 DIAGNOSIS — K5190 Ulcerative colitis, unspecified, without complications: Secondary | ICD-10-CM

## 2021-06-23 DIAGNOSIS — D571 Sickle-cell disease without crisis: Secondary | ICD-10-CM

## 2021-06-23 MED ORDER — OXYCODONE HCL 10 MG PO TABS *I*
10.0000 mg | ORAL_TABLET | ORAL | 0 refills | Status: DC | PRN
Start: 2021-06-23 — End: 2021-07-31
  Filled 2021-06-23: qty 168, 28d supply, fill #0

## 2021-06-23 NOTE — Progress Notes (Signed)
[facility]  SICKLE CELL - HEALTH MAINTENANCE VISIT       CHIEF COMPLAINT   Sickle Cell Disease Health Maintenance visit  Ulcerative colitis    Subjective     SUBJECTIVE   History of HgSS with HPFH    Current disease modifying therapy: None    Routine Health Screening    Cardiopulmonary and Renal Disease  - Previous diagnosis of hypertension? No  - U/A with micro and urine albumin-to-creatinine ratio in the past year? No    Cardiopulmonary Disease  - Wheezing or increased cough during episode of acute upper respiratory infection? No  - Dyspnea at rest or with exertion? No  - Increase in exercise limitation compared to baseline that is unexplained by other factors? No  - History of recurrent hypoxemia at rest or with exertion? No  - Evidence for sleep disordered breathing? No  - History of syncope or pre-syncope? No  - History of recurrent acute chest syndrome? No  - History of pulmonary embolism? No  - If "yes" to any of the questions above, screening PFT's should be considered  - If "yes" to any of the questions above, ECHO recommended.     Cerebrovascular Disease  - Trouble remembering things that have happened recently? No  - Trouble recalling conversations a few days later? No  - Difficulty in finding the right word or tend to use the wrong words when speaking? No  - Difficulty managing money and financial affairs (e.g. paying bills, budgeting)? No  - Difficulty managing his or her medication independently? No  - If "yes" to any of the questions above, formal referral to a psychologist or a PCP able to perform more in-depth cognitive evaluation     Retinopathy Screening  - Previous diagnosis of sickle retinopathy? No  - Retinal evaluation performed in the past year? No    Transfusion-related complications  - History chronic transfusion therapy with ferritin > 1000 ng/ml? No  - If "yes", MRI (R2, T2* or R2*) for liver iron content recommended      Ulcerative colitis   He remains on Mesalamine  and denies any symptoms-no abdominal pain, diarrhea, or bloody stools   Does not follow with GI regularly- last colonoscopy 4 years ago      Medications Reviewed and changes     Current Outpatient Medications:     mesalamine (ASACOL HD) 800 mg tablet, Take 3 tablets (2,400 mg total) by mouth 2 times daily, Disp: 180 tablet, Rfl: 2    diclofenac (VOLTAREN) 1 % gel, Apply 4 g to affected area 4 times daily., Disp: 100 g, Rfl: 2    ferrous sulfate 325 (65 FE) MG tablet, Take 1 tablet (325 mg total) by mouth 2 times daily (with meals), Disp: 100 tablet, Rfl: 3    naloxone (NARCAN) 4 mg/0.1 mL nasal spray, Instill 1 spray in 1 nostril once for opioid reversal. Repeat in alternating nostrils with new package every 2-3 minutes until response, Disp: 2 each, Rfl: 5    oxyCODONE (ROXICODONE) 10 mg immediate release tablet, Take 1 tablet (10 mg total) by mouth every 4 hours as needed for Pain.  Max daily dose: 60 mg (6 tablets)., Disp: 168 tablet, Rfl: 0    cyclobenzaprine (FLEXERIL) 5 mg tablet, Take 1 tablet (5 mg total) by mouth 3 times daily as needed for Muscle spasms  for Muscle Spasm, Disp: 60 tablet, Rfl: 0    SUMAtriptan (IMITREX) 50 MG tablet, Take 1 tablet (50  mg total) by mouth as needed for Migraine Take at onset of headache. May repeat once in 2 hours., Disp: 2 tablet, Rfl: 0         Objective   OBJECTIVE   BP 125/67    Pulse 72    Temp 36.3 C (97.3 F) (Temporal)    Wt 82.8 kg (182 lb 9.6 oz)    SpO2 100%    BMI 24.09 kg/m   Physical Exam        Lab results: 09/06/20  0855   WBC 7.3   Hemoglobin 7.7*   Hematocrit 27*   RBC 4.0*   Platelets 458*          ASSESSMENT / DIAGNOSIS   Telly was seen today for sickle cell anemia.    Diagnoses and all orders for this visit:    Sickle cell disease with HPFH  -     AMB REFERRAL TO OPHTHALMOLOGY/OPTOMETRY    Other orders  -     oxyCODONE (ROXICODONE) 10 mg immediate release tablet; Take 1 tablet (10 mg total) by mouth every 4 hours as needed for Pain.  Max daily  dose: 60 mg (6 tablets).        Cardiopulmonary and Renal Disease  - Blood pressure at goal of ? 130/80 mmHg Yes  - U/A with micro and urine albumin-to-creatinine ratio indicated? No  - Refer patient urine albumin-to-creatinine ratio of >300 mg/g or >300 mg/24 hours) to nephrologist    Cardiopulmonary Disease  - ECHO to evaluate for pulmonary hypertension indicated? No  - If peak TRJV ? 2.5 m/sec, will perform 6-minute walk distance ( ) or NT-BNP (RHC indicated if TRJV ? 2.5 m/sec + increased NT-BNP or abnormal ( )  - PFT's indicated? N/A    Cerebrovascular Disease  - In-depth cognitive evaluation indicated based on simplified signaling questions? No  - Formal referral to a psychologist or dedicated appointment with PCP scheduled? N/A  - Screening MRI brain indicated?  No (1-time screening to detect silent cerebral infarcts indicated in HbSS or HbS? only)    Retinopathy Screening  - Retinal evaluation performed in the past year? No  - Referral for retinal evaluation needed? Yes    Transfusion-related complications  - History chronic transfusion therapy with ferritin > 1000 ng/ml? No  - If "yes", MRI (R2, T2* or R2*) for liver iron content recommended    Ulcerative colitis  --Stable and without symptoms  -- Continue current dose of Mesalamine  -- According to guidelines needs next colonoscopy in 2023      Follow up in about 3 months (around 09/22/2021) for [facility] clinic schedule in 3 months.  -Patient instructed to call if symptoms are not improving or worsening    Electronically signed by Signed: Marchia Meiers, MD on 06/23/2021   [facility]     I personally spent 45 minutes, on the calendar day of the encounter, including PreVisit, Visit, and Post visit work, in the care of this patient.

## 2021-06-27 ENCOUNTER — Other Ambulatory Visit: Payer: Self-pay

## 2021-06-28 ENCOUNTER — Other Ambulatory Visit: Payer: Self-pay

## 2021-06-29 ENCOUNTER — Other Ambulatory Visit: Payer: Self-pay

## 2021-07-14 ENCOUNTER — Encounter: Payer: Self-pay | Admitting: Internal Medicine

## 2021-07-14 ENCOUNTER — Other Ambulatory Visit: Payer: Self-pay

## 2021-07-14 MED ORDER — IBUPROFEN 400 MG PO TABS *I*
ORAL_TABLET | ORAL | 0 refills | Status: DC
Start: 2021-07-14 — End: 2021-08-15
  Filled 2021-07-14: qty 28, 7d supply, fill #0

## 2021-07-14 MED ORDER — ACETAMINOPHEN 325 MG PO TABS *I*
325.0000 mg | ORAL_TABLET | Freq: Four times a day (QID) | ORAL | 0 refills | Status: DC | PRN
Start: 2021-07-14 — End: 2022-01-11
  Filled 2021-07-14: qty 28, 7d supply, fill #0

## 2021-07-16 ENCOUNTER — Other Ambulatory Visit: Payer: Self-pay

## 2021-07-17 NOTE — Telephone Encounter (Signed)
Writer notified [place], Hotel manager of need for acute dental visit. Dental office is currently closed to new patients - awaiting response to see if Rhen is an established [facility] patient. If not, will likely refer to [facility] for acute appointment. Awaiting response.     Gary Fleet, RN

## 2021-07-18 NOTE — Telephone Encounter (Signed)
Writer spoke with Andrey Campanile, Dental Coordinator whom states she has scheduled Pleas for follow up with [facility] office.     Gary Fleet, RN

## 2021-07-24 ENCOUNTER — Other Ambulatory Visit: Payer: Self-pay

## 2021-07-25 ENCOUNTER — Other Ambulatory Visit: Payer: Self-pay

## 2021-07-25 ENCOUNTER — Other Ambulatory Visit: Payer: Self-pay | Admitting: Primary Care

## 2021-07-25 NOTE — Telephone Encounter (Signed)
Cameron [name] [id number] Other [facility]        08/26/2020 08/26/2020 oxycodone hcl (ir) 15 mg tab  42 7 Cameron [name] [id number] Other [facility]   08/17/2020 08/17/2020 oxycodone hcl (ir) 10 mg tab  36 6 Cameron [name] [id number] Other [facility]   08/04/2020 08/04/2020 oxycodone hcl (ir) 10 mg tab  84 14 [name], [name] MD [id number] Other [facility]     * - Drugs marked with an asterisk are compound drugs. If the compound drug is made up of more than one controlled substance, then each controlled substance will be a separate row in the table.

## 2021-07-28 ENCOUNTER — Encounter: Payer: Self-pay | Admitting: Primary Care

## 2021-07-31 ENCOUNTER — Telehealth: Payer: Self-pay | Admitting: Internal Medicine

## 2021-07-31 ENCOUNTER — Other Ambulatory Visit: Payer: Self-pay

## 2021-07-31 MED ORDER — OXYCODONE HCL 10 MG PO TABS *I*
10.0000 mg | ORAL_TABLET | ORAL | 0 refills | Status: DC | PRN
Start: 2021-07-31 — End: 2021-08-25
  Filled 2021-07-31: qty 168, 28d supply, fill #0

## 2021-07-31 NOTE — Telephone Encounter (Signed)
Incoming call from pt following up on refill as he is now completely out of his pills.    Writer apologized for the delay and stated that PCP will be paged.    Writer confirmed that [facility] will contact pt when ready to pick up.    Writer sent high priority secure chat to PCP

## 2021-07-31 NOTE — Telephone Encounter (Signed)
Called for refill of oxycodone. Chart reviewed, he is due for a refill today, it has been over 30 days. Refill sent in.

## 2021-08-15 ENCOUNTER — Inpatient Hospital Stay
Admission: EM | Admit: 2021-08-15 | Discharge: 2021-08-17 | DRG: 662 | Discharge status: Against Medical Advice | Payer: Medicaid Other | Source: Ambulatory Visit | Attending: Internal Medicine | Admitting: Internal Medicine

## 2021-08-15 ENCOUNTER — Emergency Department: Payer: Medicaid Other

## 2021-08-15 ENCOUNTER — Encounter: Payer: Self-pay | Admitting: Internal Medicine

## 2021-08-15 ENCOUNTER — Telehealth: Payer: Self-pay | Admitting: Primary Care

## 2021-08-15 ENCOUNTER — Other Ambulatory Visit: Payer: Self-pay

## 2021-08-15 DIAGNOSIS — R918 Other nonspecific abnormal finding of lung field: Secondary | ICD-10-CM

## 2021-08-15 DIAGNOSIS — Z5329 Procedure and treatment not carried out because of patient's decision for other reasons: Secondary | ICD-10-CM | POA: Diagnosis not present

## 2021-08-15 DIAGNOSIS — D571 Sickle-cell disease without crisis: Secondary | ICD-10-CM | POA: Diagnosis present

## 2021-08-15 DIAGNOSIS — M79602 Pain in left arm: Secondary | ICD-10-CM

## 2021-08-15 DIAGNOSIS — J189 Pneumonia, unspecified organism: Secondary | ICD-10-CM | POA: Diagnosis present

## 2021-08-15 DIAGNOSIS — M546 Pain in thoracic spine: Secondary | ICD-10-CM

## 2021-08-15 DIAGNOSIS — D5701 Hb-SS disease with acute chest syndrome: Principal | ICD-10-CM | POA: Diagnosis present

## 2021-08-15 DIAGNOSIS — R109 Unspecified abdominal pain: Secondary | ICD-10-CM

## 2021-08-15 DIAGNOSIS — D564 Hereditary persistence of fetal hemoglobin [HPFH]: Secondary | ICD-10-CM

## 2021-08-15 DIAGNOSIS — G43909 Migraine, unspecified, not intractable, without status migrainosus: Secondary | ICD-10-CM | POA: Diagnosis present

## 2021-08-15 DIAGNOSIS — R0602 Shortness of breath: Secondary | ICD-10-CM

## 2021-08-15 DIAGNOSIS — K8301 Primary sclerosing cholangitis: Secondary | ICD-10-CM | POA: Diagnosis present

## 2021-08-15 DIAGNOSIS — R161 Splenomegaly, not elsewhere classified: Secondary | ICD-10-CM

## 2021-08-15 DIAGNOSIS — M545 Low back pain, unspecified: Secondary | ICD-10-CM

## 2021-08-15 DIAGNOSIS — M79622 Pain in left upper arm: Secondary | ICD-10-CM

## 2021-08-15 DIAGNOSIS — K5190 Ulcerative colitis, unspecified, without complications: Secondary | ICD-10-CM | POA: Diagnosis present

## 2021-08-15 DIAGNOSIS — Z20822 Contact with and (suspected) exposure to covid-19: Secondary | ICD-10-CM | POA: Diagnosis present

## 2021-08-15 DIAGNOSIS — R519 Headache, unspecified: Secondary | ICD-10-CM

## 2021-08-15 DIAGNOSIS — R638 Other symptoms and signs concerning food and fluid intake: Secondary | ICD-10-CM

## 2021-08-15 DIAGNOSIS — E611 Iron deficiency: Secondary | ICD-10-CM

## 2021-08-15 DIAGNOSIS — Z789 Other specified health status: Secondary | ICD-10-CM

## 2021-08-15 LAB — URINALYSIS REFLEX TO CULTURE
Blood,UA: NEGATIVE
Glucose,UA: NEGATIVE
Ketones, UA: NEGATIVE
Nitrite,UA: NEGATIVE
Specific Gravity,UA: 1.018 (ref 1.002–1.030)
pH,UA: 6 (ref 5.0–8.0)

## 2021-08-15 LAB — BASIC METABOLIC PANEL
Anion Gap: 13 (ref 7–16)
CO2: 22 mmol/L (ref 20–28)
Calcium: 9.1 mg/dL — ABNORMAL LOW (ref 9.3–10.5)
Chloride: 102 mmol/L (ref 96–108)
Creatinine: 0.59 mg/dL — ABNORMAL LOW (ref 0.67–1.17)
Glucose: 84 mg/dL (ref 60–99)
Lab: 6 mg/dL (ref 6–20)
Potassium: 4.2 mmol/L (ref 3.3–5.1)
Sodium: 137 mmol/L (ref 133–145)
eGFR BY CREAT: 139 *

## 2021-08-15 LAB — CBC AND DIFFERENTIAL
Baso # K/uL: 0.1 10*3/uL (ref 0.0–0.1)
Basophil %: 0.4 %
Eos # K/uL: 0.5 10*3/uL (ref 0.0–0.5)
Eosinophil %: 3.6 %
Hematocrit: 27 % — ABNORMAL LOW (ref 40–51)
Hemoglobin: 8.1 g/dL — ABNORMAL LOW (ref 13.7–17.5)
IMM Granulocytes #: 0 10*3/uL (ref 0.0–0.0)
IMM Granulocytes: 0.2 %
Lymph # K/uL: 1.7 10*3/uL (ref 1.3–3.6)
Lymphocyte %: 12.9 %
MCH: 20 pg — ABNORMAL LOW (ref 26–32)
MCHC: 31 g/dL — ABNORMAL LOW (ref 32–37)
MCV: 65 fL — ABNORMAL LOW (ref 79–92)
Mono # K/uL: 1 10*3/uL — ABNORMAL HIGH (ref 0.3–0.8)
Monocyte %: 7.6 %
Neut # K/uL: 10.1 10*3/uL — ABNORMAL HIGH (ref 1.8–5.4)
Nucl RBC # K/uL: 0 10*3/uL (ref 0.0–0.0)
Nucl RBC %: 0 /100 WBC (ref 0.0–0.2)
Platelets: 581 10*3/uL — ABNORMAL HIGH (ref 150–330)
RBC: 4.1 MIL/uL — ABNORMAL LOW (ref 4.6–6.1)
RDW: 21 % — ABNORMAL HIGH (ref 11.6–14.4)
Seg Neut %: 75.3 %
WBC: 13.5 10*3/uL — ABNORMAL HIGH (ref 4.2–9.1)

## 2021-08-15 LAB — RBC MORPHOLOGY

## 2021-08-15 LAB — RETICULOCYTES
Retic %: 2.3 % (ref 0.7–2.3)
Retic Abs: 94.8 10*3/uL (ref 33.8–124.0)

## 2021-08-15 LAB — RUQ PANEL (ED ONLY)
ALT: 202 U/L — ABNORMAL HIGH (ref 0–50)
AST: 120 U/L — ABNORMAL HIGH (ref 0–50)
Albumin: 3.4 g/dL — ABNORMAL LOW (ref 3.5–5.2)
Alk Phos: 1383 U/L — ABNORMAL HIGH (ref 40–130)
Amylase: 60 U/L (ref 28–100)
Bili,Indirect: 1.1 mg/dL — ABNORMAL HIGH (ref 0.1–1.0)
Bilirubin,Direct: 1.4 mg/dL — ABNORMAL HIGH (ref 0.0–0.3)
Bilirubin,Total: 2.5 mg/dL — ABNORMAL HIGH (ref 0.0–1.2)
Lipase: 16 U/L (ref 13–60)
Total Protein: 8.2 g/dL — ABNORMAL HIGH (ref 6.3–7.7)

## 2021-08-15 LAB — PERFORMING LAB

## 2021-08-15 LAB — URINE MICROSCOPIC (IQ200): RBC,UA: NONE SEEN /hpf (ref 0–2)

## 2021-08-15 LAB — INFLUENZA A: Influenza A PCR: 0

## 2021-08-15 LAB — RSV PCR: RSV PCR: 0

## 2021-08-15 LAB — COVID-19 NAAT (PCR): COVID-19 NAAT (PCR): NEGATIVE

## 2021-08-15 LAB — INFLUENZA B PCR: Influenza B PCR: 0

## 2021-08-15 MED ORDER — AZITHROMYCIN 500MG IN 280ML D5W *I*
500.0000 mg | INTRAVENOUS | Status: DC
Start: 2021-08-15 — End: 2021-08-17
  Administered 2021-08-15 – 2021-08-16 (×2): 500 mg via INTRAVENOUS
  Filled 2021-08-15 (×2): qty 5

## 2021-08-15 MED ORDER — ENOXAPARIN SODIUM 40 MG/0.4ML IJ SOSY *I*
40.0000 mg | PREFILLED_SYRINGE | Freq: Every day | INTRAMUSCULAR | Status: DC
Start: 2021-08-15 — End: 2021-08-17
  Filled 2021-08-15: qty 0.4

## 2021-08-15 MED ORDER — KETOROLAC TROMETHAMINE 30 MG/ML IJ SOLN *I*
30.0000 mg | Freq: Four times a day (QID) | INTRAMUSCULAR | Status: DC
Start: 2021-08-15 — End: 2021-08-17
  Administered 2021-08-15 – 2021-08-16 (×3): 30 mg via INTRAVENOUS
  Filled 2021-08-15 (×6): qty 1

## 2021-08-15 MED ORDER — HYDROMORPHONE HCL 2 MG/ML IJ SOLN *WRAPPED*
2.0000 mg | INTRAMUSCULAR | Status: DC | PRN
Start: 2021-08-15 — End: 2021-08-15
  Administered 2021-08-15: 2 mg via INTRAVENOUS
  Filled 2021-08-15: qty 1

## 2021-08-15 MED ORDER — NALOXONE HCL 0.4 MG/ML IJ SOLN *WRAPPED*
0.1000 mg | Status: DC | PRN
Start: 2021-08-15 — End: 2021-08-17

## 2021-08-15 MED ORDER — CEFTRIAXONE SODIUM 1 G IN STERILE WATER 10ML SYRINGE *I*
1000.0000 mg | INTRAVENOUS | Status: DC
Start: 2021-08-15 — End: 2021-08-17
  Administered 2021-08-15 – 2021-08-16 (×2): 1000 mg via INTRAVENOUS
  Filled 2021-08-15 (×2): qty 1000

## 2021-08-15 MED ORDER — MESALAMINE 800 MG PO TBEC *I*
2400.0000 mg | DELAYED_RELEASE_TABLET | Freq: Two times a day (BID) | ORAL | Status: DC
Start: 2021-08-15 — End: 2021-08-17
  Administered 2021-08-15 – 2021-08-16 (×2): 2400 mg via ORAL
  Filled 2021-08-15 (×4): qty 3

## 2021-08-15 MED ORDER — SUMATRIPTAN SUCCINATE 50 MG PO TABS *I*
50.0000 mg | ORAL_TABLET | Freq: Every day | ORAL | Status: DC | PRN
Start: 2021-08-15 — End: 2021-08-17
  Administered 2021-08-15: 50 mg via ORAL
  Filled 2021-08-15 (×2): qty 1

## 2021-08-15 MED ORDER — HYDROMORPHONE PCA 1 MG/ML *WRAPPED*
INTRAMUSCULAR | Status: DC
Start: 2021-08-15 — End: 2021-08-16
  Filled 2021-08-15: qty 25

## 2021-08-15 MED ORDER — HYDROMORPHONE HCL 2 MG/ML IJ SOLN *WRAPPED*
2.0000 mg | INTRAMUSCULAR | Status: DC | PRN
Start: 2021-08-15 — End: 2021-08-16

## 2021-08-15 MED ORDER — CYCLOBENZAPRINE HCL 5 MG PO TABS *I*
5.0000 mg | ORAL_TABLET | Freq: Three times a day (TID) | ORAL | Status: DC | PRN
Start: 2021-08-15 — End: 2021-08-17

## 2021-08-15 MED ORDER — KETOROLAC TROMETHAMINE 30 MG/ML IJ SOLN *I*
30.0000 mg | Freq: Once | INTRAMUSCULAR | Status: AC
Start: 2021-08-15 — End: 2021-08-15
  Administered 2021-08-15: 30 mg via INTRAVENOUS
  Filled 2021-08-15: qty 1

## 2021-08-15 MED ORDER — IOHEXOL 350 MG/ML (OMNIPAQUE) IV SOLN *I*
1.0000 mL | Freq: Once | INTRAVENOUS | Status: AC
Start: 2021-08-15 — End: 2021-08-15
  Administered 2021-08-15: 70 mL via INTRAVENOUS

## 2021-08-15 MED ORDER — SODIUM CHLORIDE 0.9 % INJ (FLUSH) WRAPPED *I*
3.0000 mL | Freq: Two times a day (BID) | Status: DC
Start: 2021-08-15 — End: 2021-08-17
  Administered 2021-08-15: 3 mL via INTRAVENOUS

## 2021-08-15 MED ORDER — SODIUM CHLORIDE 0.9 % IV BOLUS *I*
2000.0000 mL | Freq: Once | Status: AC
Start: 2021-08-15 — End: 2021-08-15
  Administered 2021-08-15: 2000 mL via INTRAVENOUS

## 2021-08-15 MED ORDER — D5W & 0.45% NACL IV SOLN *I*
125.0000 mL/h | INTRAVENOUS | Status: DC
Start: 2021-08-15 — End: 2021-08-16
  Administered 2021-08-15: 125 mL/h via INTRAVENOUS

## 2021-08-15 NOTE — ED Triage Notes (Signed)
Left flank pain x 2 weeks, worse over the last 2 days. Denies fever. Sent in by pmd.

## 2021-08-15 NOTE — H&P (Signed)
[place]  ATTENDING ADMISSION NOTE  Chief Complaint   Patient presents with    Flank Pain        HPI   Cameron [name] is a 25 y.o. male with a history of HgSS who is currently being admitted for pain secondary to vaso-occlusive episode. His girlfriend is present at the time of this encounter.     Cameron [name] was in his usual state of health until last week. Four days ago he developed some chest pain on the right. It was tolerable at first and he was able to treat it with home oxycodone 10 mg every 4 hours. Two days ago he started to have increased pain and he ended up reaching out to the [facility] yesterday overnight. He was instructed to come in due to his chest pain.   Onset of pain: four days ago, worsening in last 48 hours   Location of pain: chest   Inciting events: none identified, although girlfriend had RSV last week   Interventions at home: heat packs, oxycodone   Current pain similar to past crises: yes   Chest pain: yes    Dyspnea: none    Fever: none    Oral intake/hydration: not eating well, has been able to drink gatorade   ED course: Started on pain medications with ketorolac and dilaudid and experienced excellent pain relief from these medications. Due to his chest pain and tachycardia he had a CT angiogram done which ruled out a central PE, but did show bilateral lung consolidations. There were some other findings consistent with sickle cell disease (bone changes, enlarged spleen). He was given a fluid bolus as well.    Home Pain Regimen:   Non pharmacologic interventions: heat, rest and oral hydration   Non opioid pain modifying medications: ibuprofen , tylenol  and flexeril  Short acting Opioid pain medications:oxycodone  Long acting Opioid pain medications: None    PMHX / SocHx /FamHx       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        Social History     Socioeconomic History    Marital status: Single   Tobacco Use     Smoking status: Never    Smokeless tobacco: Never   Substance and Sexual Activity    Alcohol use: No    Drug use: No   Social History Narrative    06/09/15- Lives at home with mom, brother (29 yrs, 40 yrs), sister (5 yrs) and nephew (4 yrs).        Family History   Problem Relation Age of Onset    Other Mother     Sickle cell anemia Sister     Sickle cell anemia Brother     Sickle cell anemia Brother        MEDS / ALLERGIES / ROS     MEDS:   Prior to Admission medications    Medication Sig Start Date End Date Taking? Authorizing Provider   oxyCODONE (ROXICODONE) 10 mg immediate release tablet Take 1 tablet (10 mg total) by mouth every 4 hours as needed for Pain. Max daily dose: 6 tablets (60 mg). 07/31/21   Arty Baumgartner, MD   ibuprofen (ADVIL,MOTRIN) 400 mg tablet Take 1 tablet by mouth every six hours 07/14/21      acetaminophen (TYLENOL) 325 mg tablet Take 1 tablet by mouth every six hours as needed for pain 07/14/21  mesalamine (ASACOL HD) 800 mg tablet Take 3 tablets (2,400 mg total) by mouth 2 times daily 06/01/21   Marchia Meiers, MD   cyclobenzaprine (FLEXERIL) 5 mg tablet Take 1 tablet (5 mg total) by mouth 3 times daily as needed for Muscle spasms  for Muscle Spasm 02/06/21   Jodi Geralds, NP   diclofenac (VOLTAREN) 1 % gel Apply 4 g to affected area 4 times daily. 10/04/20   Jodi Geralds, NP   SUMAtriptan (IMITREX) 50 MG tablet Take 1 tablet (50 mg total) by mouth as needed for Migraine Take at onset of headache. May repeat once in 2 hours. 12/16/19   Fransico Him, NP   ferrous sulfate 325 (65 FE) MG tablet Take 1 tablet (325 mg total) by mouth 2 times daily (with meals) 04/29/18   [name], Diona Browner, MD   naloxone [facility]) 4 mg/0.1 mL nasal spray Instill 1 spray in 1 nostril once for opioid reversal. Repeat in alternating nostrils with new package every 2-3 minutes until response 01/23/17   [name], Diona Browner, MD       Allergies:   No Known Allergies (drug, envir,  food or latex)    ROS (10+):   Review of Systems   Constitutional: Negative for chills and fever.   HENT: Negative for congestion, sinus pain and sore throat.    Eyes: Negative for discharge and redness.   Respiratory: Positive for cough. Negative for sputum production, shortness of breath and wheezing.    Cardiovascular: Positive for chest pain. Negative for leg swelling.   Gastrointestinal: Negative for abdominal pain, blood in stool, constipation, diarrhea, melena, nausea and vomiting.   Musculoskeletal: Positive for joint pain. Negative for myalgias.   Skin: Negative for itching and rash.   Neurological: Negative for sensory change and weakness.   Psychiatric/Behavioral: Negative for substance abuse. The patient is not nervous/anxious.        OBJECTIVE   BP: (130)/(69)   Temp:  [36.7 C (98.1 F)]   Temp src: Temporal (11/22 0945)  Heart Rate:  [103]   Resp:  [18]   SpO2:  [100 %]   Height:  [185.4 cm (6\' 1" )]   Weight:  [83.9 kg (185 lb)]   Gen: sitting comfortably, in no distress.   Eyes: clear conjunctivae, anicteric sclerae   ENT: MMM  CV: RRR, normal S1S2, no m/g/r, no LEE  Resp: Unlabored respirations, lungs CTAB, no w/r/c   GI: Round, soft, NT, ND, normoactive BS.    Skin: warm, dry, no rash  Neuro: A&O to conversation, face symmetric, speech/language WNL, moving all extremities    Labs:     Electrolytes: Blood   Recent Labs   Lab 08/15/21  1126   Sodium 137   Potassium 4.2   CO2 22   UN 6   Creatinine 0.59*   Glucose 84   Calcium 9.1*   No results for input(s): MG in the last 168 hours. Recent Labs   Lab 08/15/21  1126   WBC 13.5*   Hemoglobin 8.1*   Hematocrit 27*   Platelets 581*   Seg Neut % 75.3   Lymphocyte % 12.9   Monocyte % 7.6   Eosinophil % 3.6    No results for input(s): APTT, INR, PTT, PTI in the last 168 hours.       ASSESSMENT AND PLAN   Cameron [name] is a 25 y.o. male with a history of HgSS presenting with chest pain concerning for vaso-occlusive crisis and possible acute  chest syndrome    Acute  chest syndrome   Bilateral infiltrates on CT angiogram   Start ceftriaxone and azithromycin   current hemoglobin level is above his baseline and he is not hypoxic so no transfusion indicated presently. Monitor closely   Supplemental oxygen at 2 LPM via nasal cannula   Monitor pulse ox and alert provider for saturation below 92%   Incentive spirometry every hour    Vaso-occlusive episode    D5 1/2NS at 144mL/hr. Discontinue as soon as tolerating PO intake. Prolonged IVF is associated with increased LOS.    Monitor CBC, BMP   Pain control: NHLBI & ASH recommend rapid initiation of opioids for acute VOEs with rapid reassessment of pain hourly and titration of pain medication via PCA  o PCA: start once on floor. Dilaudid 1 mg every 20 minutes with no continuous  o While still in ED will continue 2 mg dilaudid every 2 hours via IV push  o No tylenol given liver inflammation and possible PSC  o Ketorolac ATC x5 days   o Flexeril PRN   Once on oral medications, 24 hrs of observation to monitor for sx of withdrawal     Diphenhydramine PRN for opioid-associated pruritis   Consider naloxone gtt (0.103mcg/kg/hr) if high doses of diphenydramine needed for pruritis   Bowel regimen: polyethylene glycol and senna as needed   ondansetron PRN for nausea   Thrombocytosis and leukocytosis likely secondary to ACS/VOE    Sickle cell disease - HgSS    Disease modifying therapy: no current therapy    Continue folic acid   Last ferritin level: 12   If patient has a fever >38.3C obtain blood cultures and start ceftriaxone 2g IV q24h if not allergic, evaluate for source of infection.   If complaints of chest pain or hypoxia, obtain CXR to evaluate for acute chest syndrome.    Transfusion indicated? No   o 2014 NHLBI SCA transfusion recommendations: simple txfsn for symptomatic ACS with Hb 1g/dL below baseline, exchange txfsn if severe symptomatic ACS (SpO2<90% in spite of supplemental oxygen), acute splenic sequestration  with severe anemia, simple or exchange for stroke. Consensus recommendations for hepatic sequestration/intrahepatic cholestasis (exchange or simple), MSOF (exchange or simple), simple txfsn for aplastic crisis or symptomatic anemia.      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 Does not follow with GI. Has been scheduled to have MRCP but never completed (did have one in 2014 showing no signs of PSC). Will discuss with him about timeline for getting this study done   Iron deficiency: continue oral iron   Migraines: sumatriptan as needed    DVT Prophylaxis: LMWH    Disposition: likely will require 5-7 days of admission to treat ACS and pain    Signed: Arty Baumgartner, MD on 08/15/2021   [facility]    This visit required high complexity medical decision making due to: IV administered opioid or other controlled medications    For Reference, the patient's Care Coordination Note that details inpatient care plan has been entered below. It can be found by typing .carecoord, on the problem list tab (hyperlinked in blue at the top), [place] tab, and the lighthouse icon in the top-left hand corner of the patient's screen.    Individualized pain protocol:  notes 01/07/20 [name]        Orders Written  Venofer  x 3   Order signed 4/23    12/02/19 Iron 2, TIBC 389, Transferrin Sat 6, Ferritin 13 6.3/22   TB/PPD     HBV       Updated DKrebs FNP  02/03/20             Pain management treatment plan   Patient has co diagnosis of ulcerative colitis dx age 25.  In addition Blood type O+ with antibodies anti-fya,jkb,lea  Outpatient Pain Regimen:  Long acting: none  Short acting; Oxy IR 10mg  Q4 hrs prn pain  Home Based titration regimen: none, needs assessment due to uncontrolled UC0  Adjunct therapies: none, holding NSAID due to ongoing LGI bleeding  Other:  none    [facility]    ED/Infusion Center care plan recommendations:  Toradol 30 mg IV Q6 hrs ATC  NS 2 L IV bolus  Transfuse for HcT <18  Hydromorphone 2 mg IV Q30 min prn pain score>5  Recommend hospitalization is pain score remains >5 after 3 doses of Hydromorphone 2 mg IV    Labs on arrival: CBC, retic, CHEM14  CXR if complaints of chest pain to evaluate for infiltrate consistent with acute chest syndrome  Assess for need for bronchodilator if complaints of chest pain    Inpatient care plan recommendations:  Toradol 30 mg IV Q6 hrs ATC  NS bolus until euvolemic followed by D51/2NS at 125 cc/hr until tolerating PO well  PCA: Start with demand only Hydromorophone PCA 1 mg Q20 min with lockout 12 mg over 4 hrs Recommend titration of pain regimen until achieving pain score <5     Prior to discharge recommend 24 hours of PO pain medications to assess for sx of withdrawal with transition and continued pain control to prevent readmission    Transfuse if HcT< 18 and consider GI consult as patient's most recent cause for profound anemia is due to UC    Venofer orders in Dr Concha NorwayPulcino

## 2021-08-15 NOTE — Telephone Encounter (Signed)
See telephone encounter from this morning, included discussion of this mychart message

## 2021-08-15 NOTE — Telephone Encounter (Signed)
Received the following mychart from pt this morning:    Goodmorning, Ive been having these stabbing like pains on the left side of my body in my kidney area for the past 3-4 weeks, but its gotten worse over the weekend. Its stopping me from sleeping it hurt when I lay down at night no more what side Im laying on it hurts Im having a hard time breathing sometimes & Im just not sure what it is. [facility] & they said they had a wait time for about 6-8 hours & wouldnt be seen no time soon so I just came back home for the night. I wanna get looked at before its to late & it becomes more serious       Writer called and spoke with pt  Having significant pain that has been increasing over the past 3-4 weeks  At times, has trouble breathing (unable to decipher if this is from pain or his lungs)  Pain is primarily on his left side  Pain is worse when he is lying down  No pain with voiding, but does state that his urine can be quite dark at times  Denies trauma  Afebrile    Agreed that pt needs assessment  Pt is going to go to [facility] ED  Writer called and gave reports to [facility] ED  Pt expecting to head over in the next 10-15 minutes  Will route to MD and check on pt tomorrow

## 2021-08-15 NOTE — Telephone Encounter (Signed)
Incoming call from [place], patient's SO.  She reported that patient is c/o stabbing pain on his left upper ribs by his armpit. Pt c/o that the pain has been getting worse the past few days. Patient can't get comfortable and can't sleep d/t this. Ziona reported that on-call provider was called last night and was suggested that pt get a xr.    Writer advised Ziona that this message will be routed to triage, and pt will get a return call. She thanked for my help and ended call.    Cb# [phone].    Routed to nursing.

## 2021-08-15 NOTE — Progress Notes (Signed)
08/15/21 2100   [facility] Patient Class Review   Patient Class Review Inpatient

## 2021-08-15 NOTE — ED Notes (Signed)
Report Given To  W7 RN       Descriptive Sentence / Reason for Admission     Chief Complaint: Flank Pain    Admitting Diagnosis: Bilateral pneumonia        Active Issues / Relevant Events     Orientation Status: A04     Ambulates with: Indep     Skin/wounds/specialty bed needs: N    Precautions: N    O2 needs:N    IV Access: 20 RFA     IV Infusions: N    Pain control:  PCA Pump    Primary Language: English     PO status: NPO    Patient Belongings list completed (yes or no):        To Do List      Anticipatory Guidance / Discharge Planning    Plan of care: abx, pain control   Per pump   Education needed: n    Psychosocial concerns: n    Support person with patient: wife    ]

## 2021-08-15 NOTE — Progress Notes (Signed)
Pt explained he can't do well on the IS due to pain in his chest - stressed the importance of lung health and taking deep breaths if he doesn't think he can do IS. Also suggested splinting chest with a pillow if his pain is that bad. Visitor was very helpful with encouraging pt to use IS properly and the importance of it.

## 2021-08-15 NOTE — ED Provider Notes (Addendum)
History     Chief Complaint   Patient presents with   . Flank Pain     Cameron [name] is a 25 year old male with past medical history significant for sickle cell disease, ulcerative colitis, cholelithiasis, and primary sclerosing cholangitis, who presents today for left flank pain.  Patient states that the pain came on abruptly 2 to 3 weeks ago with no associated trauma or significant event.  Pain acutely worsened this Sunday. Pain is primarily located in his left flank and upper back including his left axillary region.  Sunday the pain has been constant and stabbing/aching.  Rates the pain an 8 out of 10.  Also feels like the pain is radiating to his left lower back and since Sunday he has also had a constant stabbing pain in his left arm. Patient states that the pain is increased by breathing, laughing, or trying to lay down regardless of position.  It is not improved by the oxycodone that he takes for his sickle cell disease and ulcerative colitis.  Denies recent illness although his girlfriend had RSV last week.  Denies fevers, chills, night sweats, chest pain, palpitations, nausea or vomiting, diarrhea, constipation, dysuria, blood in urine or stool.  Has had recently decreased appetite and headache.  Did take oxycodone this morning as well as mesalamine and Flexeril.  Denies any tobacco use, does smoke marijuana recreationally and last used it last night.             Medical/Surgical/Family History     Past Medical History:   Diagnosis Date   . Cholelithiasis    . 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 Sickle cell disease with HPFH D57.1, D56.4   . Back pain M54.9   . Ulcerative colitis K51.90   . Pain medication agreement completed Z02.89   . Iron deficiency E61.1   . Primary sclerosing cholangitis K83.01            Past Surgical History:   Procedure Laterality Date   . CHOLECYSTECTOMY  2014     Family History   Problem Relation Age of Onset   .  Other Mother    . Sickle cell anemia Sister    . Sickle cell anemia Brother    . Sickle cell anemia Brother           Social History     Tobacco Use   . Smoking status: Never   . Smokeless tobacco: Never   Substance Use Topics   . Alcohol use: No   . Drug use: No     Living Situation     Questions Responses    Patient lives with Family    Homeless No    Caregiver for other family member     External Services     Employment     Domestic Violence Risk                 Review of Systems   Review of Systems   Constitutional: Positive for activity change and appetite change. Negative for chills and fever.   HENT: Negative for congestion and rhinorrhea.    Eyes: Negative for pain and discharge.   Respiratory: Positive for shortness of breath. Negative for cough.    Cardiovascular: Negative for chest pain and palpitations.   Gastrointestinal: Negative for abdominal pain, blood in stool, constipation, diarrhea, nausea and vomiting.   Genitourinary: Negative  for dysuria, frequency, hematuria and urgency.   Musculoskeletal: Positive for back pain. Negative for joint swelling.   Skin: Negative for color change and rash.   Neurological: Positive for headaches. Negative for dizziness and tremors.   Psychiatric/Behavioral: Negative for behavioral problems and confusion.       Physical Exam     Triage Vitals  Triage Start: Start, (08/15/21 1610)  First Recorded BP: 130/69, Resp: 18, Temp: 36.7 C (98.1 F), Temp src: TEMPORAL Oxygen Therapy SpO2: 100 %, Oximetry Source: Rt Hand, O2 Device: None (Room air), Heart Rate: 103, (08/15/21 0945)  .  First Pain Reported  0-10 Scale: 6, Pain Location/Orientation: Flank Left, (08/15/21 0955)       Physical Exam  Constitutional:       General: He is not in acute distress.  HENT:      Head: Normocephalic and atraumatic.      Right Ear: External ear normal.      Left Ear: External ear normal.      Nose: Nose normal.      Mouth/Throat:      Mouth: Mucous membranes are moist.      Pharynx:  Oropharynx is clear.   Eyes:      General: Scleral icterus present.      Extraocular Movements: Extraocular movements intact.      Pupils: Pupils are equal, round, and reactive to light.   Cardiovascular:      Rate and Rhythm: Normal rate and regular rhythm.      Pulses: Normal pulses.      Heart sounds: Normal heart sounds.   Pulmonary:      Effort: Pulmonary effort is normal.      Breath sounds: Normal breath sounds.   Chest:      Chest wall: No tenderness.   Abdominal:      General: Abdomen is flat. Bowel sounds are normal.      Palpations: Abdomen is soft.      Tenderness: There is no abdominal tenderness. There is left CVA tenderness. There is no right CVA tenderness.   Musculoskeletal:         General: Normal range of motion.      Cervical back: Normal range of motion and neck supple.   Skin:     General: Skin is warm and dry.   Neurological:      General: No focal deficit present.      Mental Status: He is alert and oriented to person, place, and time.   Psychiatric:         Mood and Affect: Mood normal.         Behavior: Behavior normal.         Thought Content: Thought content normal.         Judgment: Judgment normal.         Medical Decision Making     Assessment:  Cameron [name] is a 25 year old with a past medical history of sickle cell disease, ulcerative colitis, and primary sclerosing cholangitis who presents today with several weeks of left flank pain with worsening of the pain over the past couple of days with associated left arm pain.  He is unable to say if this pain is completely different from his sickle cell pain and says he has not had a sickle cell crisis in a while.  Pain has been resistant to his home oxycodone and Flexeril.  He has not had any recent fever or illness however his partner was diagnosed with RSV  last week.  He has been having trouble taking deep breaths due to this pain.  Vital signs stable. On exam appears uncomfortable but nontoxic-appearing.  Scleral icterus noted which he states  is not his baseline.  Very tender to light touch even of the skin of left flank region.  No right flank pain or abdominal pain on palpation.     Differential diagnosis:  Sickle cell crisis  Pulmonary embolism   Acute chest  Splenic infarction  Nephrolithiasis   Hydronephrosis   Pneumonia     Plan:  Urinalysis reflex to culture and microscopic  BMP, CBC, reticulocyte count, right upper quadrant panel, COVID/flu/RSV  EKG  CT angiogram chest  Left renal retroperitoneal ultrasound  2 L NS bolus  30 mg Toradol, Dilaudid 2 mg q 30 minutes for 3 doses    ED Course and Disposition:  CBC significant for WBC 13.5 with neutrophilia, platelets elevated at 581  BMP reassuring   UA without blood  RUQ panel with total bilirubin 2.5, indirect bili 1.1, direct bili 1.4, alk phos 1,383, AST 120, ALT 202  Reticulocyte count in process  COVID negative   EKG NSR  Retroperitoneal [place] negative for hydronephrosis   CT angio chest negative for central pulmonary embolus, significant for "moderate, focal consolidation with air bronchograms involving the right lower lobe and moderate, focal, patchy consolidation with air bronchograms involving the left lower lobe. The findings are nonspecific but would be consistent with a clinical   diagnosis of pneumonia"    1400 After receiving 30 mg toradol and 2 mg dilaudid at 1145 patient denies any current pain. Patient able to ambulate with O2 saturation maintained at 100%. Dicussed results of imaging with patient, recommended inpatient admission for treatment of pneumonia and pain management. Patient in agreement with inpatient admission.     Discussed patient with Complex Care attending Dr. Glenetta Hew, plan to admit to inpatient for further treatment.               Luanne Bras, MD      Resident Attestation:    Patient seen by me on 08/15/2021.    I saw and evaluated the patient. I agree with the resident's/fellow's findings and plan of care as documented above.    Author:  Clayton Lefort, DO        Luanne Bras, MD  Resident  08/15/21 1456       Aliyyah Riese, Jonny Ruiz, DO  08/17/21 1610

## 2021-08-15 NOTE — ED Notes (Signed)
08/15/21 0911   Expected Patient   Date of notification 08/15/21   Notified by Office/PCP   ED Service Adult Call-in   Pt Info note/Reason for sending Left side flank pain, S.O.B.   Patient coming from Home   Expected Call-In Information   Report called to [employer]

## 2021-08-16 LAB — HEPATIC FUNCTION PANEL
ALT: 147 U/L — ABNORMAL HIGH (ref 0–50)
AST: 79 U/L — ABNORMAL HIGH (ref 0–50)
Albumin: 2.9 g/dL — ABNORMAL LOW (ref 3.5–5.2)
Alk Phos: 1124 U/L — ABNORMAL HIGH (ref 40–130)
Bili,Indirect: 0.9 mg/dL (ref 0.1–1.0)
Bilirubin,Direct: 1.1 mg/dL — ABNORMAL HIGH (ref 0.0–0.3)
Bilirubin,Total: 2 mg/dL — ABNORMAL HIGH (ref 0.0–1.2)
Total Protein: 7.3 g/dL (ref 6.3–7.7)

## 2021-08-16 LAB — CBC
Hematocrit: 22 % — ABNORMAL LOW (ref 40–51)
Hemoglobin: 6.7 g/dL — ABNORMAL LOW (ref 13.7–17.5)
MCH: 20 pg — ABNORMAL LOW (ref 26–32)
MCHC: 31 g/dL — ABNORMAL LOW (ref 32–37)
MCV: 66 fL — ABNORMAL LOW (ref 79–92)
Platelets: 417 10*3/uL — ABNORMAL HIGH (ref 150–330)
RBC: 3.4 MIL/uL — ABNORMAL LOW (ref 4.6–6.1)
RDW: 20.3 % — ABNORMAL HIGH (ref 11.6–14.4)
WBC: 12 10*3/uL — ABNORMAL HIGH (ref 4.2–9.1)

## 2021-08-16 LAB — BASIC METABOLIC PANEL
Anion Gap: 10 (ref 7–16)
CO2: 21 mmol/L (ref 20–28)
Calcium: 8 mg/dL — ABNORMAL LOW (ref 9.3–10.5)
Chloride: 106 mmol/L (ref 96–108)
Creatinine: 0.62 mg/dL — ABNORMAL LOW (ref 0.67–1.17)
Glucose: 92 mg/dL (ref 60–99)
Lab: 6 mg/dL (ref 6–20)
Potassium: 3.9 mmol/L (ref 3.3–5.1)
Sodium: 137 mmol/L (ref 133–145)
eGFR BY CREAT: 137 *

## 2021-08-16 MED ORDER — OXYCODONE HCL 5 MG PO TABS *I*
10.0000 mg | ORAL_TABLET | ORAL | Status: DC | PRN
Start: 2021-08-16 — End: 2021-08-17
  Filled 2021-08-16: qty 2

## 2021-08-16 NOTE — Telephone Encounter (Signed)
Pt currently admitted  Will wait for TCM encounter once discharged

## 2021-08-16 NOTE — Progress Notes (Signed)
SW checked in with pt at bedside. He confirms home address and primary contact. He voices no SW needs at this time and is aware that SW remains available.    Kearney Hard, LMSW  Pager: [phone]  08/16/21  10:59 AM

## 2021-08-16 NOTE — Progress Notes (Signed)
[facility]  ATTENDING PROGRESS NOTE    Interval Events   No acute events overnight    HPI     Location of pain: flank  Change in pain: improving rapidly  Pain controlled? Yes  Oral intake/hydration: adequate  Constipation: No  Chest pain:  Yes  Dyspnea: No    Review of Systems   Constitutional: Negative for chills and fever.   Respiratory: Positive for cough. Negative for sputum production and shortness of breath.    Cardiovascular: Positive for chest pain. Negative for leg swelling.   Musculoskeletal: Positive for myalgias.        Scheduled Meds:    mesalamine  2,400 mg Oral BID    sodium chloride  3 mL Intravenous Q12H    enoxaparin  40 mg Subcutaneous Daily @ 2100    cefTRIAXone  1,000 mg Intravenous Q24H    azithromycin  500 mg Intravenous Q24H    ketorolac  30 mg Intravenous Q6H      Continuous Infusions:      PRN Meds:oxyCODONE, cyclobenzaprine, SUMAtriptan, naloxone     OBJECTIVE   BP: (110-129)/(55-75)   Temp:  [36.2 C (97.2 F)-37.2 C (99 F)]   Temp src: Temporal (11/23 1108)  Heart Rate:  [85-102]   Resp:  [16-18]   SpO2:  [98 %-100 %]   General: No acute distress  Eye: clear conjunctivae, anicteric sclerae   ENT: MMM  CV: RRR, no murmurs, no edema  Pulm: Normal work of breathing, CTA, No crackles, wheezing, rhonchi  Abdomen: Soft, non-distended, non-tender, no rebound tenderness, bowel sounds active  Neuro: A&O to conversation, face symmetric, speech/language WNL, moving all extremities        Lab results: 08/16/21  0405 08/15/21  1126 09/06/20  0855   WBC 12.0* 13.5* 7.3   Hemoglobin 6.7* 8.1* 7.7*   Hematocrit 22* 27* 27*   RBC 3.4* 4.1* 4.0*   Platelets 417* 581* 458*       Lab Results   Component Value Date    SEGR 75.3 08/15/2021     Lab Results   Component Value Date    RETAP 2.3 08/15/2021         Lab results: 08/16/21  0405 08/15/21  1226 08/15/21  1126 09/06/20  0855   Sodium 137  --  137 139   Potassium 3.9  --  4.2 4.1   Chloride 106  --  102 104   CO2 21  --  22 23   UN 6  --   6 6   Creatinine 0.62*  --  0.59* 0.66*   GFR,Caucasian  --   --   --  136   GFR,Black  --   --   --  157   Glucose 92  --  84 76   Calcium 8.0*  --  9.1* 9.0*   Total Protein 7.3 8.2*  --  8.1*   Albumin 2.9* 3.4*  --  3.8   ALT 147* 202*  --  173*   AST 79* 120*  --  130*   Alk Phos 1,124* 1,383*  --  1,014*   Bilirubin,Total 2.0* 2.5*  --  4.6*       Ferritin   Date Value Ref Range Status   01/11/2020 12 (L) 20 - 250 ng/mL Final         Imaging: No interval imaging studies    ASSESSMENT AND PLAN   Cameron [name] is a 25 y.o. male with a history  of HgSS presenting with chest pain concerning for vaso-occlusive crisis and acute chest syndrome    Acute chest syndrome   Bilateral infiltrates on CT angiogram   Continue ceftriaxone and azithromycin   Monitor hemoglobin closely, after fluid resuscitation he is near his baseline   Supplemental oxygen at 2 LPM via nasal cannula   Monitor pulse ox and alert provider for saturation below 92%   Incentive spirometry every hour    Vaso-occlusive episode    D5 1/2NS at 164m/hr. Discontinue as soon as tolerating PO intake. Prolonged IVF is associated with increased LOS.    Monitor CBC, BMP   Pain control: NHLBI & ASH recommend rapid initiation of opioids for acute VOEs with rapid reassessment of pain hourly and titration of pain medication via PCA  ? Stop PCA and restart home oxycodone  ? No tylenol given liver inflammation and possible PSC  ? Ketorolac ATC x5 days   ? Flexeril PRN   Once on oral medications, 24 hrs of observation to monitor for sx of withdrawal     Diphenhydramine PRN for opioid-associated pruritis   Consider naloxone gtt (0.272m/kg/hr) if high doses of diphenydramine needed for pruritis   Bowel regimen: polyethylene glycol and senna as needed   ondansetron PRN for nausea   Thrombocytosis and leukocytosis likely secondary to ACS/VOE    Sickle cell disease - HgSS    Disease modifying therapy: no current therapy    Continue folic acid   Last  ferritin level: 12   If patient has a fever >38.3C obtain blood cultures and start ceftriaxone 2g IV q24h if not allergic, evaluate for source of infection.   If complaints of chest pain or hypoxia, obtain CXR to evaluate for acute chest syndrome.    Transfusion indicated? No   ? 2014 NHLBI SCA transfusion recommendations: simple txfsn for symptomatic ACS with Hb 1g/dL below baseline, exchange txfsn if severe symptomatic ACS (SpO2<90% in spite of supplemental oxygen), acute splenic sequestration with severe anemia, simple or exchange for stroke. Consensus recommendations for hepatic sequestration/intrahepatic cholestasis (exchange or simple), MSOF (exchange or simple), simple txfsn for aplastic crisis or symptomatic anemia.      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 Does not follow with GI. Has been scheduled to have MRCP but never completed (did have one in 2014 showing no signs of PSC). Pain improving rapidly, will likely pursue this study as an outpatient  Iron deficiency: continue oral iron   Migraines: sumatriptan as needed    DVT Prophylaxis: LMWH    The patient's Care Coordination Note that details inpatient care plan can be found on the problem list tab (hyperlinked in blue at the top), [facility], and the lighthouse icon in the top-left hand corner of the patient's screen.    Signed: MaArty BaumgartnerMD on 08/16/2021  [facility]  This visit required high complexity medical decision making due to: IV administered opioid or other controlled medications

## 2021-08-16 NOTE — Discharge Instructions (Signed)
Brief Summary of Your Hospital Course (including key procedures and diagnostic test results):  You came to the hospital with a sickle cell pain crisis that  responded well to IV fluids, toradol, minimal dilaudid. You were placed on your home dose of Oxy IR. You had significant pain on admit in your chest. You were given an incentive spirometer to use to help you move air thru your whole lung. You did a great job using it and were able to increase the air you could comfortably move in to your lungs.     Your instructions:  ***    New Medications:  ***     Changes to existing medications:  ***    What to do after you leave the hospital:    Recommended diet: Regular - No restrictions     Recommended activity: activity as tolerated    Wound Care: {wound care:18263}    If you experience any of these symptoms within the first 24 hours after discharge:{symptoms after discharge:19936}  please follow up with the discharge attending *** at phone-number: ***    If you experience any of these symptoms 24 hours or more after discharge:{symptoms after discharge:19936}  please follow up with {CALL IF WKMQKM:63817}

## 2021-08-16 NOTE — Progress Notes (Signed)
APP Cross Cover Note    Notified by bedside nurse, Cameron [name] is requesting to leave AMA, he states for personal reasons.     BP 121/65    Pulse 97    Temp 37 C (98.6 F)    Resp 18    Ht 1.854 m (6\' 1" )    Wt 83.9 kg (185 lb)    SpO2 95%    BMI 24.41 kg/m     Chart reviewed. Cameron [name] is 25yo M PMHx significant for SSD currently admitted for treatment of acute chest syndrome and vaso-occlusive episode. Pneumonia being treated with IV ceftriaxone and azithromycin (has received 2 doses of each). He states he is now feeling better, is no longer experiencing chest pain or SOB and wishes to go home.   I extensively explained his current condition, treatment, and risks associated with leaving prior to completion of antibiotic treatment. He states he understands, but would still like to leave AMA.     Paperwork completed with RN 25yo at bedside.     Shanda Bumps, NP 10:16 PM

## 2021-08-17 ENCOUNTER — Other Ambulatory Visit: Payer: Self-pay

## 2021-08-17 ENCOUNTER — Telehealth: Payer: Self-pay | Admitting: Internal Medicine

## 2021-08-17 MED ORDER — AZITHROMYCIN 250 MG PO TABS *I*
ORAL_TABLET | ORAL | 0 refills | Status: DC
Start: 2021-08-17 — End: 2022-01-11
  Filled 2021-08-17: qty 2, 1d supply, fill #0

## 2021-08-17 MED ORDER — CEFDINIR 300 MG PO CAPS *I*
300.0000 mg | ORAL_CAPSULE | Freq: Two times a day (BID) | ORAL | 0 refills | Status: AC
Start: 2021-08-17 — End: 2021-08-20
  Filled 2021-08-17: qty 6, 3d supply, fill #0

## 2021-08-17 NOTE — Telephone Encounter (Signed)
I called patient this morning to see what his instructions were at discharge. He was not instructed to take antibiotics or prescribed any. I will send in azithromycin and cefdinir to complete a 5 day course of therapy. He voiced understanding of this plan.

## 2021-08-21 ENCOUNTER — Encounter: Payer: Self-pay | Admitting: Internal Medicine

## 2021-08-21 NOTE — Continuity of Care (Signed)
Care Coordinator Transitions Discharge Note: Contact 2       Hospital Admission Date/Discharge Date: 08/15/2021 To 08/17/2021     Discharge Diagnosis at the time of discharge:    1. Pneumonia         Hospital Area Discharged From: [facility]     Discharge Disposition:  Left against medical advice    Pending Labs at Discharge:          Course of Hospital Stay(copied from hospital summary): CONCISE NARRATIVE: 25 y.o. male with a history of HgSS who is currently being admitted for pain secondary to vaso-occlusive episode. Gvien IV fluids, toradol and IV dilaudid. Used minimal dilaudid. Pain relieved with toradol. Started on home dose Oxy IR    Medications: New, Changed, or Stopped:     Medication List      No changes were made to your prescriptions during this visit.         Future Appointments        Aug 25 2021   09:00 AM - NEW PATIENT VISIT  [facility] - Mila Homer, MD,PhD           Sep 08 2021   10:00 AM - FOLLOW UP VISIT  [facility]             Upcoming Appointments and To Do's:    Your To Do List     Marchia Meiers, MD .    Specialties: Internal Medicine, Pediatrics  Contact information  [address]  [address]  [place] [address]  [phone]                                 Post Discharge Details for Care Manager    Hospital or ED Risk of Admission Score %: 38    Pharmacist reviewed discharge medication list for accuracy?    If blank, no documentation available.     Pharmacist offered discharge education to patient or primary caregiver?    If blank, no documentation available.     Patient Preferred Phone Number:  [phone]     Caregiver Name and Phone Number:        Discharge Address (if different than patient's home address):       If blank, no documentation available.     Home Care Agency and services provided:       If blank, no documentation available.     Home Care Agency:    Accepted or  Declined:    If blank, no documentation available.     New Equipment Ordered this Admission:     If blank, no documentation available.     New Equipment Vendor Name: n/a       Who is managing new anticoagulation therapy? N/A    Future Labs and Imaging Needed: If blank, no future lab or imaging orders placed at discharge.       Additional Comments: Patient left against medical advice, please follow up with them ASAP.      Cordie Grice, RN  08/21/2021

## 2021-08-24 LAB — EKG 12-LEAD
P: 20 deg
PR: 124 ms
QRS: 11 deg
QRSD: 83 ms
QT: 367 ms
QTc: 420 ms
Rate: 79 {beats}/min
T: -1 deg

## 2021-08-24 NOTE — Progress Notes (Deleted)
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            Ophthalmic medications:  No current outpatient medications on file. (Ophthalmic)       Current Outpatient Medications (Other)   Medication Sig Dispense Refill    azithromycin (ZITHROMAX) 250 mg tablet Take 2 tablets by mouth today 2 tablet 0    oxyCODONE (ROXICODONE) 10 mg immediate release tablet Take 1 tablet (10 mg total) by mouth every 4 hours as needed for Pain. Max daily dose: 6 tablets (60 mg). 168 tablet 0    acetaminophen (TYLENOL) 325 mg tablet Take 1 tablet by mouth every six hours as needed for pain 28 tablet 0    mesalamine (ASACOL HD) 800 mg tablet Take 3 tablets (2,400 mg total) by mouth 2 times daily 180 tablet 2    cyclobenzaprine (FLEXERIL) 5 mg tablet Take 1 tablet (5 mg total) by mouth 3 times daily as needed for Muscle spasms  for Muscle Spasm 60 tablet 0    diclofenac (VOLTAREN) 1 % gel Apply 4 g to affected area 4 times daily. 100 g 2    SUMAtriptan (IMITREX) 50 MG tablet Take 1 tablet (50 mg total) by mouth as needed for Migraine Take at onset of  headache. May repeat once in 2 hours. 2 tablet 0    ferrous sulfate 325 (65 FE) MG tablet Take 1 tablet (325 mg total) by mouth 2 times daily (with meals) 100 tablet 3    naloxone (NARCAN) 4 mg/0.1 mL nasal spray Instill 1 spray in 1 nostril once for opioid reversal. Repeat in alternating nostrils with new package every 2-3 minutes until response 2 each 5       Allergies: Allergies: No Known Allergies (drug, envir, food or latex)        Objective:       There were no vitals filed for this visit.    Not recorded           Ophthalmic imaging and testing:       Assessment and Plan:      # Sickle Cell Disease (HgS high F)  - Recent hospital admission 08/15/21 for acute chest  syndrome / vaso-occlusive episode    Follow up: [facility] in *** for ***    Letter to ***          {TIP CMS has instituted new guidelines for E and M billing.  If you would like to bill based on time, use the list below to add the correct documentation to your note.  If you select nothing, none of this text will appear.:28549}      {TIMESPENTLIST (Optional):34438}    Jani Gravel, MD, PhD  Uveitis Specialist and Comprehensive Ophthalmologist  [facility] [place] [facility] [place]    Refraction  Dilation

## 2021-08-25 ENCOUNTER — Other Ambulatory Visit: Payer: Self-pay

## 2021-08-25 ENCOUNTER — Encounter: Payer: Medicaid Other | Admitting: Ophthalmology

## 2021-08-25 ENCOUNTER — Other Ambulatory Visit: Payer: Self-pay | Admitting: Internal Medicine

## 2021-08-25 MED ORDER — OXYCODONE HCL 10 MG PO TABS *I*
10.0000 mg | ORAL_TABLET | ORAL | 0 refills | Status: DC | PRN
Start: 2021-08-25 — End: 2021-09-21
  Filled 2021-08-25: qty 168, 28d supply, fill #0

## 2021-08-25 NOTE — Progress Notes (Signed)
This encounter was created in error - please disregard.    Patient no showed for appointment

## 2021-08-25 NOTE — Telephone Encounter (Signed)
Patient Name: Cameron [name]   Birth Date: 1996/01/15   Address: [address] [place], [place] [address]   Sex: Male   Rx Written Rx Dispensed Drug Quantity Days Supply Prescriber Name Prescriber Dea # Payment Method Dispenser   07/31/2021 07/31/2021 oxycodone hcl (ir) 10 mg tab  [address] Glean Hess [id number] Other [facility]   06/23/2021 06/29/2021 oxycodone hcl (ir) 10 mg tab  [address] MD [id number] Other [facility]   06/15/2021 06/15/2021 oxycodone hcl (ir) 10 mg tab  84 [name] MD [id number] Other [facility]   06/01/2021 06/01/2021 oxycodone hcl (ir) 10 mg tab  84 14 [name] MD [id number] Other [facility]   05/16/2021 05/17/2021 oxycodone hcl (ir) 10 mg tab  84 14 [name] MD [id number] Other [facility]   05/01/2021 05/02/2021 oxycodone hcl (ir) 10 mg tab  84 14 Jodi Geralds [id number] Other [facility]   04/17/2021 04/17/2021 oxycodone hcl (ir) 10 mg tab  84 14 Midge Minium [id number] Other [facility]   03/31/2021 04/03/2021 oxycodone hcl (ir) 10 mg tab  84 14 Jodi Geralds [id number] Other [facility]   03/17/2021 03/17/2021 oxycodone hcl (ir) 10 mg tab  84 14 Jodi Geralds [id number] Other [facility]   03/02/2021 03/03/2021 oxycodone hcl (ir) 10 mg tab  84 14 Jodi Geralds [id number] Other [facility]   02/17/2021 02/17/2021 oxycodone hcl (ir) 10 mg tab  84 14 Jodi Geralds [id number] Other [facility]   02/06/2021 02/06/2021 oxycodone hcl (ir) 10 mg tab  84 14 Jodi Geralds [id number] Other [facility]   01/23/2021 01/23/2021 oxycodone hcl (ir) 10 mg tab  84 14 Jodi Geralds [id number] Other [facility]   01/09/2021 01/09/2021 oxycodone hcl (ir) 10 mg  tab  84 14 Jodi Geralds [id number] Other [facility]   12/27/2020 12/27/2020 oxycodone hcl (ir) 10 mg tab  84 14 Jodi Geralds [id number] Other [facility]   12/13/2020 12/14/2020 oxycodone hcl (ir) 10 mg tab  84 14 Jodi Geralds [id number] Other [facility]   11/29/2020 11/30/2020 oxycodone hcl (ir) 10 mg tab  84 14 Jodi Geralds [id number] Other [facility]   11/14/2020 11/15/2020 oxycodone hcl (ir) 10 mg tab  84 14 Jodi Geralds [id number] Other [facility]   11/01/2020 11/01/2020 oxycodone hcl (ir) 10 mg tab  84 14 [name], [name] [id number] Other [facility]   10/18/2020 10/18/2020 oxycodone hcl (ir) 10 mg tab  84 14 [name], [name] [id number] Other [facility]   10/04/2020 10/04/2020 oxycodone hcl (ir) 10 mg tab  84 14 Jodi Geralds [id number] Other [facility]   09/20/2020 09/20/2020 oxycodone hcl (ir) 10 mg tab  84 14 [name], [name] MD [id number] Other [facility]   09/08/2020 09/08/2020 oxycodone hcl (ir) 10 mg tab  84 14 Jodi Geralds [id number] Other [facility]   08/26/2020 08/26/2020 oxycodone hcl (ir) 15 mg tab  42 7 Jodi Geralds [id number] Other [facility]     * - Drugs marked with an asterisk are compound drugs. If the compound drug is made up of more than one controlled substance, then each controlled substance will be a separate row in the table.

## 2021-08-26 ENCOUNTER — Other Ambulatory Visit: Payer: Self-pay

## 2021-08-26 ENCOUNTER — Telehealth: Payer: Self-pay | Admitting: Internal Medicine

## 2021-08-26 MED ORDER — ONDANSETRON HCL 4 MG PO TABS *I*
4.0000 mg | ORAL_TABLET | Freq: Three times a day (TID) | ORAL | 0 refills | Status: DC | PRN
Start: 2021-08-26 — End: 2022-01-11
  Filled 2021-08-26: qty 21, 7d supply, fill #0

## 2021-08-26 NOTE — Telephone Encounter (Signed)
[facility]  After Hours Phone Note    Date of call: 08/26/2021  Time of call: 3:15 PM       History    Cameron [name] [birth year] called reporting pain and constipation     Abdominal pain starting 4-5 am   Feeling constipated   Feels like there is something there and its not coming out   No blood per rectum   He ate cereal and this caused him to have emesis   No fever   Stool is green/yellow      Assessment     Viral gastroenteritis, vs UC flare, vs sickle cell crisis in GI tract, unclear etiology at this time     Plan      1.  Patient does not feel pain is bad enough to seek ED care   Will continue supportive care at home including attempts at hydration  2. Sent new Rx for zofran to promote hydration and ability to take and keep down medications  - plan to talk again tomorrow, Sunday     Cameron [name] was instructed to call back if symptoms worsen, change or progress.      Jacki Cones, MD  08/26/2021  3:15 PM

## 2021-08-27 ENCOUNTER — Other Ambulatory Visit: Payer: Self-pay

## 2021-08-28 ENCOUNTER — Other Ambulatory Visit: Payer: Self-pay

## 2021-08-30 ENCOUNTER — Telehealth: Payer: Self-pay

## 2021-08-30 NOTE — Telephone Encounter (Signed)
Copied from CRM #[id number]. Topic: Appointments - Schedule Appointment  >> Aug 30, 2021  3:17 PM Cameron [name] D wrote:  The patient is calling to schedule a FUV.    Writer forwarded caller to [employer].

## 2021-09-08 ENCOUNTER — Other Ambulatory Visit: Payer: Self-pay

## 2021-09-08 ENCOUNTER — Ambulatory Visit: Payer: Medicaid Other | Admitting: Oncology

## 2021-09-08 VITALS — BP 125/67 | HR 92 | Temp 98.4°F | Wt 179.5 lb

## 2021-09-08 DIAGNOSIS — D564 Hereditary persistence of fetal hemoglobin [HPFH]: Secondary | ICD-10-CM

## 2021-09-08 DIAGNOSIS — M25561 Pain in right knee: Secondary | ICD-10-CM

## 2021-09-08 DIAGNOSIS — G4700 Insomnia, unspecified: Secondary | ICD-10-CM

## 2021-09-08 DIAGNOSIS — D571 Sickle-cell disease without crisis: Secondary | ICD-10-CM

## 2021-09-08 DIAGNOSIS — M25562 Pain in left knee: Secondary | ICD-10-CM

## 2021-09-08 MED ORDER — MELATONIN 10 MG PO CAPS
10.0000 mg | ORAL_CAPSULE | Freq: Every evening | ORAL | 11 refills | Status: DC
Start: 2021-09-08 — End: 2023-10-07
  Filled 2021-09-08 – 2021-10-18 (×2): qty 30, 30d supply, fill #0

## 2021-09-08 NOTE — Patient Instructions (Signed)
Sleep hygiene:  -Take melatonin at least 2 hours prior to when you want to be asleep  Some habits that can improve your sleep health:    Be consistent. Go to bed at the same time each night and get up at the same time each morning, including on the weekends  Make sure your bedroom is quiet, dark, relaxing, and at a comfortable temperature  Remove electronic devices, such as TVs, computers, and smart phones, from the bedroom  Avoid large meals, caffeine, and alcohol before bedtime  Get some exercise. Being physically active during the day can help you fall asleep more easily at night.

## 2021-09-08 NOTE — Progress Notes (Signed)
[facility]    Office Visit Note     REASON FOR VISIT      Chief Complaint   Patient presents with    Follow-up         PRIMARY DIAGNOSIS      Sickle cell disease    Subjective     SUBJECTIVE        Insomnia:  Been going on for a few months  Endorses trouble falling asleep and staying asleep  Example: Did not fall asleep until 4am - up at 8am  Not tired during the day  No chest pain  No snoring    Sickle Cell pain:  Does have daily pain - in bilateral knees  Rated 7-8/10  This sometimes is keeping him up at night  Oxycodone stops pain and he is able to sleep then              Review of Systems   Constitutional: Negative for chills and fever.   HENT: Negative for congestion.    Respiratory: Negative for cough, sputum production and shortness of breath.    Cardiovascular: Negative for chest pain.   Gastrointestinal: Negative.    Musculoskeletal: Positive for joint pain.     Review of Systems as per HPI above    Past Medical History, Social History, Family History, and Medications/allergies reviewed during this visit    Current Outpatient Medications   Medication    ondansetron (ZOFRAN) 4 mg tablet    oxyCODONE (ROXICODONE) 10 mg immediate release tablet    azithromycin (ZITHROMAX) 250 mg tablet    mesalamine (ASACOL HD) 800 mg tablet    cyclobenzaprine (FLEXERIL) 5 mg tablet    SUMAtriptan (IMITREX) 50 MG tablet    ferrous sulfate 325 (65 FE) MG tablet    naloxone (NARCAN) 4 mg/0.1 mL nasal spray    Melatonin 10 MG CAPS    acetaminophen (TYLENOL) 325 mg tablet    diclofenac (VOLTAREN) 1 % gel     No current facility-administered medications for this visit.          Objective     OBJECTIVE      BP 125/67    Pulse 92    Temp 36.9 C (98.4 F) (Temporal)    Wt 81.4 kg (179 lb 8 oz)    SpO2 100%    BMI 23.68 kg/m     Physical Exam  Constitutional:       General: He is not in acute distress.     Appearance: He is not ill-appearing, toxic-appearing or diaphoretic.   Eyes:      General: No  scleral icterus.        Right eye: No discharge.   Cardiovascular:      Heart sounds: Normal heart sounds.   Pulmonary:      Effort: Pulmonary effort is normal. No respiratory distress.      Breath sounds: Normal breath sounds.   Musculoskeletal:      Right knee: No swelling or deformity. Normal range of motion. Tenderness present.      Left knee: No swelling or deformity. Tenderness present.   Skin:     General: Skin is warm and dry.      Coloration: Skin is not jaundiced or pale.   Neurological:      Mental Status: He is alert.      Sensory: No sensory deficit.      Gait: Gait normal.  ASSESSMENT / DIAGNOSIS         1. Knee pain, bilateral  Will obtain imaging  Continue current pain regimen - this resolves the pain      - Knee BILATERAL 4 views; Future    2. Sickle cell disease with HPFH  Underlying condition, all treatment decisions made with this in consideration       3. 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 Go to bed at the same time each night and get up at the same time each morning, including on the weekends  Make sure your bedroom is quiet, dark, relaxing, and at a comfortable temperature  Remove electronic devices, such as TVs, computers, and smart phones, from the bedroom  Avoid large meals, caffeine, and alcohol before bedtime  Get some exercise. Being physically active during the day can help you fall asleep more easily at night.             --Patient instructed to call if symptoms are not improving or worsening  --Follow-up arranged  Follow up in about 3 months (around 12/07/2021).     I personally spent 30  minutes on the calendar day of the encounter, including pre and post visit work reviewing the EMR and management of this patient.     Electronically signed by Palma Holter, NP , 09/11/2021 @   [facility], Phone: [phone]

## 2021-09-18 ENCOUNTER — Other Ambulatory Visit: Payer: Self-pay

## 2021-09-21 ENCOUNTER — Other Ambulatory Visit: Payer: Self-pay | Admitting: Internal Medicine

## 2021-09-21 ENCOUNTER — Other Ambulatory Visit: Payer: Self-pay | Admitting: Primary Care

## 2021-09-21 ENCOUNTER — Other Ambulatory Visit: Payer: Self-pay

## 2021-09-21 MED ORDER — OXYCODONE HCL 10 MG PO TABS *I*
10.0000 mg | ORAL_TABLET | ORAL | 0 refills | Status: DC | PRN
Start: 2021-09-21 — End: 2021-10-18
  Filled 2021-09-21: qty 168, 28d supply, fill #0

## 2021-09-21 NOTE — Telephone Encounter (Signed)
Patient Name: Cameron [name]   Birth Date: 05-05-1996   Address: [address] [place], [place] [address]   Sex: Male   Rx Written Rx Dispensed Drug Quantity Days Supply Prescriber Name Prescriber Dea # Payment Method Dispenser   08/25/2021 08/28/2021 oxycodone hcl (ir) 10 mg tab  168 [address] MD [id number] Other [facility]   07/31/2021 07/31/2021 oxycodone hcl (ir) 10 mg tab  168 28 Glean Hess [id number] Other [facility]   06/23/2021 06/29/2021 oxycodone hcl (ir) 10 mg tab  168 [address] MD [id number] Other [facility]   06/15/2021 06/15/2021 oxycodone hcl (ir) 10 mg tab  84 14 [name] MD [id number] Other [facility]   06/01/2021 06/01/2021 oxycodone hcl (ir) 10 mg tab  84 14 [name] MD [name] Other [facility]   05/16/2021 05/17/2021 oxycodone hcl (ir) 10 mg tab  84 14 [name] MD [id number] Other [facility]   05/01/2021 05/02/2021 oxycodone hcl (ir) 10 mg tab  84 14 Jodi Geralds [id number] Other [facility]   04/17/2021 04/17/2021 oxycodone hcl (ir) 10 mg tab  84 14 Midge Minium [id number] Other [facility]   03/31/2021 04/03/2021 oxycodone hcl (ir) 10 mg tab  84 14 Jodi Geralds [id number] Other [facility]   03/17/2021 03/17/2021 oxycodone hcl (ir) 10 mg tab  84 14 Jodi Geralds [id number] Other [facility]   03/02/2021 03/03/2021 oxycodone hcl (ir) 10 mg tab  84 14 Jodi Geralds [id number] Other [facility]   02/17/2021 02/17/2021 oxycodone hcl (ir) 10 mg tab  84 14 Jodi Geralds [id number] Other [facility]   02/06/2021 02/06/2021 oxycodone hcl (ir) 10 mg tab  84 14 Jodi Geralds [id number] Other [facility]   01/23/2021 01/23/2021 oxycodone hcl (ir) 10 mg  tab  84 14 Jodi Geralds [id number] Other [facility]   01/09/2021 01/09/2021 oxycodone hcl (ir) 10 mg tab  84 14 Jodi Geralds [id number] [facility]   12/27/2020 12/27/2020 oxycodone hcl (ir) 10 mg tab  84 14 Jodi Geralds [id number] [facility]   12/13/2020 12/14/2020 oxycodone hcl (ir) 10 mg tab  84 14 Jodi Geralds [id number] [facility]   11/29/2020 11/30/2020 oxycodone hcl (ir) 10 mg tab  84 14 Jodi Geralds [id number] [facility]   11/14/2020 11/15/2020 oxycodone hcl (ir) 10 mg tab  84 14 Jodi Geralds [id number] [facility]   11/01/2020 11/01/2020 oxycodone hcl (ir) 10 mg tab  84 14 [name], [name] [id number] Other [facility]   10/18/2020 10/18/2020 oxycodone hcl (ir) 10 mg tab  84 14 [name], [name] MD [id number] Other [facility]   10/04/2020 10/04/2020 oxycodone hcl (ir) 10 mg tab  [address] Jodi Geralds [id number] Other [facility]     * - Drugs marked with an asterisk are compound drugs. If the compound drug is made up of more than one controlled substance, then each controlled substance will be a separate row in the table.

## 2021-09-23 ENCOUNTER — Other Ambulatory Visit: Payer: Self-pay

## 2021-09-23 MED ORDER — CYCLOBENZAPRINE HCL 5 MG PO TABS *I*
5.0000 mg | ORAL_TABLET | Freq: Three times a day (TID) | ORAL | 0 refills | Status: DC | PRN
Start: 2021-09-23 — End: 2023-10-07
  Filled 2021-09-23: qty 60, 20d supply, fill #0

## 2021-09-24 ENCOUNTER — Other Ambulatory Visit: Payer: Self-pay

## 2021-09-25 ENCOUNTER — Other Ambulatory Visit: Payer: Self-pay

## 2021-10-10 ENCOUNTER — Ambulatory Visit: Payer: Medicaid Other | Admitting: Ophthalmology

## 2021-10-18 ENCOUNTER — Other Ambulatory Visit: Payer: Self-pay | Admitting: Primary Care

## 2021-10-18 ENCOUNTER — Other Ambulatory Visit: Payer: Self-pay

## 2021-10-18 NOTE — Telephone Encounter (Signed)
Patient Name: Cameron [name]   Birth Date: 1996/04/06   Address: [address] [place], [place] [address]   Sex: Male   Rx Written Rx Dispensed Drug Quantity Days Supply Prescriber Name Prescriber Dea # Payment Method Dispenser   09/21/2021 09/25/2021 oxycodone hcl (ir) 10 mg tab  168 [address] MD [id number] Other [facility]   08/25/2021 08/28/2021 oxycodone hcl (ir) 10 mg tab  168 [address] MD [id number] [facility]   07/31/2021 07/31/2021 oxycodone hcl (ir) 10 mg tab  168 28 Glean Hess [id number] [facility]   06/23/2021 06/29/2021 oxycodone hcl (ir) 10 mg tab  168 [address] MD [id number] [facility]   06/15/2021 06/15/2021 oxycodone hcl (ir) 10 mg tab  84 14 [name] MD [id number] [facility]   06/01/2021 06/01/2021 oxycodone hcl (ir) 10 mg tab  84 14 [name] MD [id number] [facility]   05/16/2021 05/17/2021 oxycodone hcl (ir) 10 mg tab  84 14 [name] MD [id number] [facility]   05/01/2021 05/02/2021 oxycodone hcl (ir) 10 mg tab  84 14 Jodi Geralds [id number] [facility]   04/17/2021 04/17/2021 oxycodone hcl (ir) 10 mg tab  84 14 Midge Minium [id number] Other [facility]   03/31/2021 04/03/2021 oxycodone hcl (ir) 10 mg tab  84 14 Jodi Geralds [id number] [facility]   03/17/2021 03/17/2021 oxycodone hcl (ir) 10 mg tab  84 14 Jodi Geralds [id number] [facility]   03/02/2021 03/03/2021 oxycodone hcl (ir) 10 mg tab  84 14 Jodi Geralds [id number] [facility]   02/17/2021 02/17/2021 oxycodone hcl (ir) 10 mg tab  84 14 Jodi Geralds [id number] Other [facility]   02/06/2021 02/06/2021 oxycodone hcl (ir) 10 mg  tab  84 14 Jodi Geralds [name] Other [facility]   01/23/2021 01/23/2021 oxycodone hcl (ir) 10 mg tab  84 14 Jodi Geralds [id number] [facility]   01/09/2021 01/09/2021 oxycodone hcl (ir) 10 mg tab  84 14 Jodi Geralds [id number] [facility]   12/27/2020 12/27/2020 oxycodone hcl (ir) 10 mg tab  84 14 Jodi Geralds [id number] [facility]   12/13/2020 12/14/2020 oxycodone hcl (ir) 10 mg tab  84 14 Jodi Geralds [id number] [facility]   11/29/2020 11/30/2020 oxycodone hcl (ir) 10 mg tab  84 14 Jodi Geralds [id number] Other [facility]   11/14/2020 11/15/2020 oxycodone hcl (ir) 10 mg tab  84 14 Jodi Geralds [id number] Other [facility]   11/01/2020 11/01/2020 oxycodone hcl (ir) 10 mg tab  84 14 [name], [name] MD [id number] Other [facility]     * - Drugs marked with an asterisk are compound drugs. If the compound drug is made up of more than one controlled substance, then each controlled substance will be a separate row in the table.

## 2021-10-19 ENCOUNTER — Telehealth: Payer: Self-pay

## 2021-10-19 NOTE — Telephone Encounter (Signed)
I called [facility] I was unable to leave message I will send my chart message that his MD appt on 3/20 was cx please call office to schedule new appointment

## 2021-10-20 ENCOUNTER — Encounter: Payer: Self-pay | Admitting: Oncology

## 2021-10-20 NOTE — Telephone Encounter (Signed)
error 

## 2021-10-22 ENCOUNTER — Other Ambulatory Visit: Payer: Self-pay

## 2021-10-22 MED ORDER — FERROUS SULFATE 325 (65 FE) MG PO TABS *WRAPPED* *I*
325.0000 mg | ORAL_TABLET | Freq: Two times a day (BID) | ORAL | 5 refills | Status: DC
Start: 2021-10-22 — End: 2023-02-19
  Filled 2021-10-22: qty 60, 30d supply, fill #0

## 2021-10-22 MED ORDER — OXYCODONE HCL 10 MG PO TABS *I*
10.0000 mg | ORAL_TABLET | ORAL | 0 refills | Status: DC | PRN
Start: 2021-10-22 — End: 2021-11-15
  Filled 2021-10-22: qty 168, 28d supply, fill #0

## 2021-10-29 ENCOUNTER — Other Ambulatory Visit: Payer: Self-pay

## 2021-11-14 ENCOUNTER — Encounter: Payer: Self-pay | Admitting: Primary Care

## 2021-11-15 ENCOUNTER — Other Ambulatory Visit: Payer: Self-pay | Admitting: Primary Care

## 2021-11-15 NOTE — Telephone Encounter (Signed)
Patient Name: Cameron [name]   Birth Date: 02/09/1996   Address: [address] [place], [place] [address]   Sex: Male   Rx Written Rx Dispensed Drug Quantity Days Supply Prescriber Name Prescriber Dea # Payment Method Dispenser   10/22/2021 10/22/2021 oxycodone hcl (ir) 10 mg tab  [address] MD [id number] Other [facility]   09/21/2021 09/25/2021 oxycodone hcl (ir) 10 mg tab  [address] MD [id number] Other [facility]   08/25/2021 08/28/2021 oxycodone hcl (ir) 10 mg tab  [address] MD [id number] [facility]   07/31/2021 07/31/2021 oxycodone hcl (ir) 10 mg tab  168 28 Glean Hess [id number] [facility]   06/23/2021 06/29/2021 oxycodone hcl (ir) 10 mg tab  168 28 [name] MD [id number] [facility]   06/15/2021 06/15/2021 oxycodone hcl (ir) 10 mg tab  84 14 [name] MD [id number] [facility]   06/01/2021 06/01/2021 oxycodone hcl (ir) 10 mg tab  84 14 [name] MD [id number] [facility]   05/16/2021 05/17/2021 oxycodone hcl (ir) 10 mg tab  84 14 [name] MD [id number] [facility]   05/01/2021 05/02/2021 oxycodone hcl (ir) 10 mg tab  84 14 Jodi Geralds [id number] [facility]   04/17/2021 04/17/2021 oxycodone hcl (ir) 10 mg tab  84 14 Midge Minium [id number] Other [facility]   03/31/2021 04/03/2021 oxycodone hcl (ir) 10 mg tab  84 14 Jodi Geralds [id number] Other [facility]   03/17/2021 03/17/2021 oxycodone hcl (ir) 10 mg tab  84 14 Jodi Geralds [id number] [facility]   03/02/2021 03/03/2021 oxycodone hcl (ir) 10 mg tab  84 14 Jodi Geralds [id number] [facility]   02/17/2021 02/17/2021 oxycodone hcl (ir) 10  mg tab  84 14 Jodi Geralds [id number] [facility]   02/06/2021 02/06/2021 oxycodone hcl (ir) 10 mg tab  84 14 Jodi Geralds [id number] Other [facility]   01/23/2021 01/23/2021 oxycodone hcl (ir) 10 mg tab  84 14 Jodi Geralds [id number] [facility]   01/09/2021 01/09/2021 oxycodone hcl (ir) 10 mg tab  84 14 Jodi Geralds [id number] Other [facility]   12/27/2020 12/27/2020 oxycodone hcl (ir) 10 mg tab  84 14 Jodi Geralds [id number] Other [facility]   12/13/2020 12/14/2020 oxycodone hcl (ir) 10 mg tab  84 14 Jodi Geralds [id number] Other [facility]   11/29/2020 11/30/2020 oxycodone hcl (ir) 10 mg tab  84 14 Jodi Geralds [id number] Other [facility]     * - Drugs marked with an asterisk are compound drugs. If the compound drug is made up of more than one controlled substance, then each controlled substance will be a separate row in the table.

## 2021-11-17 ENCOUNTER — Encounter: Payer: Self-pay | Admitting: Oncology

## 2021-11-18 ENCOUNTER — Other Ambulatory Visit: Payer: Self-pay

## 2021-11-18 MED ORDER — OXYCODONE HCL 10 MG PO TABS *I*
10.0000 mg | ORAL_TABLET | ORAL | 0 refills | Status: DC | PRN
Start: 2021-11-18 — End: 2021-12-14
  Filled 2021-11-18: qty 168, 28d supply, fill #0

## 2021-11-19 ENCOUNTER — Other Ambulatory Visit: Payer: Self-pay

## 2021-12-11 ENCOUNTER — Ambulatory Visit: Payer: Medicaid Other | Admitting: Student in an Organized Health Care Education/Training Program

## 2021-12-11 NOTE — Telephone Encounter (Signed)
error 

## 2021-12-13 ENCOUNTER — Ambulatory Visit: Payer: Medicaid Other | Admitting: Oncology

## 2021-12-14 ENCOUNTER — Other Ambulatory Visit: Payer: Self-pay | Admitting: Primary Care

## 2021-12-14 NOTE — Telephone Encounter (Signed)
Patient Name: Cameron [name]   Birth Date: 07/28/1996   Address: [address] [place], [place] [address]   Sex: Male   Rx Written Rx Dispensed Drug Quantity Days Supply Prescriber Name Prescriber Dea # Payment Method Dispenser   11/18/2021 11/19/2021 oxycodone hcl (ir) 10 mg tab  168 28 Scofield, Steven M MD [id number] Other [name]   10/22/2021 10/22/2021 oxycodone hcl (ir) 10 mg tab  168 28 Scofield, Steven M MD [id number] Other [name]   09/21/2021 09/25/2021 oxycodone hcl (ir) 10 mg tab  168 28 Scofield, Steven M MD [id number] Other [name]   08/25/2021 08/28/2021 oxycodone hcl (ir) 10 mg tab  168 28 Scofield, Steven M MD [id number] Other [name]   07/31/2021 07/31/2021 oxycodone hcl (ir) 10 mg tab  168 28 Mcintosh, Matthew J [id number] Other [name]   06/23/2021 06/29/2021 oxycodone hcl (ir) 10 mg tab  168 28 Scofield, Steven M MD [id number] Other [name]   06/15/2021 06/15/2021 oxycodone hcl (ir) 10 mg tab  84 14 Scofield, Steven M MD [id number] Other [name]   06/01/2021 06/01/2021 oxycodone hcl (ir) 10 mg tab  84 14 Scofield, Steven M MD [id number] Other [name]   05/16/2021 05/17/2021 oxycodone hcl (ir) 10 mg tab  84 14 Scofield, Steven M MD [id number] Other [name]   05/01/2021 05/02/2021 oxycodone hcl (ir) 10 mg tab  84 14 Reinhardt, Kristine M [id number] Other [name]   04/17/2021 04/17/2021 oxycodone hcl (ir) 10 mg tab  84 14 Lie, Ariadne [id number] Other [name]   03/31/2021 04/03/2021 oxycodone hcl (ir) 10 mg tab  84 14 Reinhardt, Kristine M [id number] Other [name]   03/17/2021 03/17/2021 oxycodone hcl (ir) 10 mg tab  84 14 Reinhardt, Kristine M [id number] Other [name]   03/02/2021 03/03/2021 oxycodone hcl (ir) 10  mg tab  84 14 Reinhardt, Kristine M [id number] Other [name]   02/17/2021 02/17/2021 oxycodone hcl (ir) 10 mg tab  84 14 Reinhardt, Kristine M [id number] Other [name]   02/06/2021 02/06/2021 oxycodone hcl (ir) 10 mg tab  84 14 Reinhardt, Kristine M [id number] Other [name]   01/23/2021 01/23/2021 oxycodone hcl (ir) 10 mg tab  84 14 Reinhardt, Kristine M [id number] Other [name]   01/09/2021 01/09/2021 oxycodone hcl (ir) 10 mg tab  84 14 Reinhardt, Kristine M [id number] Other [name]   12/27/2020 12/27/2020 oxycodone hcl (ir) 10 mg tab  84 14 Reinhardt, Kristine M [id number] Other [name]     * - Drugs marked with an asterisk are compound drugs. If the compound drug is made up of more than one controlled substance, then each controlled substance will be a separate row in the table.

## 2021-12-15 ENCOUNTER — Other Ambulatory Visit: Payer: Self-pay

## 2021-12-15 ENCOUNTER — Encounter: Payer: Self-pay | Admitting: Oncology

## 2021-12-15 MED ORDER — MESALAMINE 800 MG PO TBEC *I*
2400.0000 mg | DELAYED_RELEASE_TABLET | Freq: Two times a day (BID) | ORAL | 2 refills | Status: DC
Start: 2021-12-15 — End: 2022-01-19
  Filled 2021-12-15: qty 180, 30d supply, fill #0
  Filled 2022-01-10: qty 180, 30d supply, fill #1

## 2021-12-15 MED ORDER — OXYCODONE HCL 10 MG PO TABS *I*
10.0000 mg | ORAL_TABLET | ORAL | 0 refills | Status: DC | PRN
Start: 2021-12-15 — End: 2022-01-11
  Filled 2021-12-15: qty 168, 28d supply, fill #0

## 2021-12-15 NOTE — Telephone Encounter (Signed)
Refill requests pended in separate encounter.     Gary Fleet, RN

## 2021-12-15 NOTE — Telephone Encounter (Signed)
Patient Name: Cameron [name]   Birth Date: Nov 22, 1995   Address: [address] [place], [place] [address]   Sex: Male   Rx Written Rx Dispensed Drug Quantity Days Supply Prescriber Name Prescriber [place] # Payment Method Dispenser   11/18/2021 11/19/2021 oxycodone hcl (ir) 10 mg tab  168 [address] MD [id number] Other [facility]   10/22/2021 10/22/2021 oxycodone hcl (ir) 10 mg tab  168 [address] MD [id number] Other [facility]   09/21/2021 09/25/2021 oxycodone hcl (ir) 10 mg tab  168 [address] MD [id number] Other [facility]   08/25/2021 08/28/2021 oxycodone hcl (ir) 10 mg tab  168 28 [name] [id number] Other [facility]   07/31/2021 07/31/2021 oxycodone hcl (ir) 10 mg tab  168 28 Estill Batten [id number] Other [facility]   06/23/2021 06/29/2021 oxycodone hcl (ir) 10 mg tab  168 28 [name] [id number] Other [facility]   06/15/2021 06/15/2021 oxycodone hcl (ir) 10 mg tab  84 14 [name] [id number] Other [facility]   06/01/2021 06/01/2021 oxycodone hcl (ir) 10 mg tab  84 14 [name] [id number] Other [facility]   05/16/2021 05/17/2021 oxycodone hcl (ir) 10 mg tab  84 14 [name] [id number] Other [facility]   05/01/2021 05/02/2021 oxycodone hcl (ir) 10 mg tab  74 14 Barbaraann Boys [id number] Other [facility]   04/17/2021 04/17/2021 oxycodone hcl (ir) 10 mg tab  84 14 Doretha Imus [id number] Other [facility]   03/31/2021 04/03/2021 oxycodone hcl (ir) 10 mg tab  56 14 Barbaraann Boys [id number] Other [facility]   03/17/2021 03/17/2021 oxycodone hcl (ir) 10 mg tab  32 14 Barbaraann Boys [id number] Other [facility]   03/02/2021 03/03/2021 oxycodone hcl (ir) 10  mg tab  78 14 Barbaraann Boys [id number] Other [facility]   02/17/2021 02/17/2021 oxycodone hcl (ir) 10 mg tab  42 14 Barbaraann Boys [id number] Other [facility]   02/06/2021 02/06/2021 oxycodone hcl (ir) 10 mg tab  67 14 Barbaraann Boys [id number] Other [facility]   01/23/2021 01/23/2021 oxycodone hcl (ir) 10 mg tab  8 14 Barbaraann Boys [id number] Other [facility]   01/09/2021 01/09/2021 oxycodone hcl (ir) 10 mg tab  27 14 Barbaraann Boys [id number] Other [facility]   12/27/2020 12/27/2020 oxycodone hcl (ir) 10 mg tab  60 14 Barbaraann Boys [id number] Other [facility] marked with an asterisk are compound drugs. If the compound drug is made up of more than one controlled substance, then each controlled substance will be a separate row in the table.

## 2021-12-17 ENCOUNTER — Other Ambulatory Visit: Payer: Self-pay

## 2022-01-01 ENCOUNTER — Ambulatory Visit: Payer: Medicaid Other | Admitting: Oncology

## 2022-01-01 ENCOUNTER — Ambulatory Visit: Payer: Medicaid Other

## 2022-01-01 DIAGNOSIS — D571 Sickle-cell disease without crisis: Secondary | ICD-10-CM

## 2022-01-01 DIAGNOSIS — D564 Hereditary persistence of fetal hemoglobin [HPFH]: Secondary | ICD-10-CM

## 2022-01-01 NOTE — Progress Notes (Signed)
[facility]  SICKLE CELL - HEALTH MAINTENANCE VISIT       CHIEF COMPLAINT   Sickle Cell Disease Health Maintenance visit    Subjective   TELEMEDICINE CONSENT     Visit being conducted by Video in lieu of a face to face visit to minimize health care worker and patient exposure to COVID-19, and conserve PPE.    Location of Patient: home  Location of Telemedicine Provider: home / other  Other Participants in telemedicine encounter and roles: None    Consent was obtained from the patient to complete this telemedicine visit; including the potential for financial liability.  How did the patient provide consent? Form Read and Verbal Consent Given    SUBJECTIVE       Current disease modifying therapy: Hydroxyurea - Dose: none    Has intermittent pain in hands/fingers  Comes and goes  Lasts about 30 minutes  Worse with weather changes  Does endorse cracking knuckles  Occurs in just right hand  Does not take medicine for it- just goes away  Not able to describe exactly where- sometimes in palm  Feels like tingling  Not dropping any items    Routine Health Screening    Cardiopulmonary and Renal Disease  - Previous diagnosis of hypertension? No  - U/A with micro and urine albumin-to-creatinine ratio in the past year? No    Cardiopulmonary Disease  - Wheezing or increased cough during episode of acute upper respiratory infection? No  - Dyspnea at rest or with exertion? No  - Increase in exercise limitation compared to baseline that is unexplained by other factors? No  - History of recurrent hypoxemia at rest or with exertion? No  - Evidence for sleep disordered breathing? No  - History of syncope or pre-syncope? No  - History of recurrent acute chest syndrome? No  - History of pulmonary embolism? No  - If "yes" to any of the questions above, screening PFT's should be considered  - If "yes" to any of the questions above, ECHO recommended.     Cerebrovascular Disease  - Trouble remembering things that have  happened recently? Yes - only remembers important things  - Trouble recalling conversations a few days later? No  - Difficulty in finding the right word or tend to use the wrong words when speaking? No  - Difficulty managing money and financial affairs (e.g. paying bills, budgeting)? No  - Difficulty managing his or her medication independently? No  - If "yes" to any of the questions above, formal referral to a psychologist or a PCP able to perform more in-depth cognitive evaluation     Retinopathy Screening  - Previous diagnosis of sickle retinopathy? No  - Retinal evaluation performed in the past year? No    Transfusion-related complications  - History chronic transfusion therapy with ferritin > 1000 ng/ml? No  - If "yes", MRI (R2, T2* or R2*) for liver iron content recommended    Medications Reviewed and changes     Current Outpatient Medications:   .  mesalamine (ASACOL HD) 800 mg tablet, Take 3 tablets (2,400 mg total) by mouth 2 times daily, Disp: 180 tablet, Rfl: 2  .  oxyCODONE (ROXICODONE) 10 mg immediate release tablet, Take 1 tablet (10 mg total) by mouth every 4 hours as needed for Pain. Max daily dose: 6 tablets (60 mg)., Disp: 168 tablet, Rfl: 0  .  ferrous sulfate 325 (65 FE) mg tablet, Take 1 tablet (325 mg total) by mouth  2 times daily (with meals), Disp: 100 tablet, Rfl: 5  .  cyclobenzaprine (FLEXERIL) 5 mg tablet, Take 1 tablet (5 mg total) by mouth 3 times daily as needed for Muscle spasms  for Muscle Spasm, Disp: 60 tablet, Rfl: 0  .  Melatonin 10 MG CAPS, Take 1 capsule (10 mg) by mouth nightly, Disp: 30 capsule, Rfl: 11  .  ondansetron (ZOFRAN) 4 mg tablet, Take 1 tablet (4 mg total) by mouth 3 times daily as needed, Disp: 21 tablet, Rfl: 0  .  azithromycin (ZITHROMAX) 250 mg tablet, Take 2 tablets by mouth today, Disp: 2 tablet, Rfl: 0  .  acetaminophen (TYLENOL) 325 mg tablet, Take 1 tablet by mouth every six hours as needed for pain, Disp: 28 tablet, Rfl: 0  .  diclofenac (VOLTAREN) 1 %  gel, Apply 4 g to affected area 4 times daily., Disp: 100 g, Rfl: 2  .  SUMAtriptan (IMITREX) 50 MG tablet, Take 1 tablet (50 mg total) by mouth as needed for Migraine Take at onset of headache. May repeat once in 2 hours., Disp: 2 tablet, Rfl: 0  .  naloxone (NARCAN) 4 mg/0.1 mL nasal spray, Instill 1 spray in 1 nostril once for opioid reversal. Repeat in alternating nostrils with new package every 2-3 minutes until response, Disp: 2 each, Rfl: 5  Review of Systems   All other systems reviewed and are negative.         Objective   OBJECTIVE   PHYSICAL EXAM  This visit was performed during a pandemic event, and the physical exam was limited to my Video observation of this patient's organ systems and/or body areas.   GEN:  Alert,no distress        Lab results: 08/16/21  0405   WBC 12.0*   Hemoglobin 6.7*   Hematocrit 22*   RBC 3.4*   Platelets 417*          ASSESSMENT / DIAGNOSIS   Diagnoses and all orders for this visit:    Sickle cell disease with HPFH        Cardiopulmonary and Renal Disease  - Blood pressure at goal of ? 130/80 mmHg - cannot determine on video  - U/A with micro and urine albumin-to-creatinine ratio indicated? No  - Refer patient urine albumin-to-creatinine ratio of >300 mg/g or >300 mg/24 hours) to nephrologist    Monitor hand pain for now    Cardiopulmonary Disease  - ECHO to evaluate for pulmonary hypertension indicated? Yes  - If peak TRJV ? 2.5 m/sec, will perform 6-minute walk distance ( ) or NT-BNP (RHC indicated if TRJV ? 2.5 m/sec + increased NT-BNP or abnormal ( )  - PFT's indicated? No    Cerebrovascular Disease  - In-depth cognitive evaluation indicated based on simplified signaling questions? No  - Formal referral to a psychologist or dedicated appointment with PCP scheduled? No  - Screening MRI brain indicated?  No (1-time screening to detect silent cerebral infarcts indicated in HbSS or HbS? only)    Retinopathy Screening  - Retinal evaluation performed in the past year?  No  - Referral for retinal evaluation needed? Yes    Transfusion-related complications  - History chronic transfusion therapy with ferritin > 1000 ng/ml? No  - If "yes", MRI (R2, T2* or R2*) for liver iron content recommended   - ASH suggests iron overload screening by magnetic resonance imaging (MRI; R2, T2*, or R2*) for liver iron content every 1 to 2 years  - If prolonged (years),  exceptionally high iron burden (LIC > 15 mg/g dw) cardiac T2* MRI would be indicated )      No follow-ups on file.  -Patient instructed to call if symptoms are not improving or worsening    Electronically signed by Signed: Palma Holter, NP on 01/01/2022   [facility]      I personally spent 30 minutes, on the calendar day of the encounter, including PreVisit, Visit, and Post visit work, in the care of this patient.

## 2022-01-10 ENCOUNTER — Other Ambulatory Visit: Payer: Self-pay

## 2022-01-10 ENCOUNTER — Other Ambulatory Visit: Payer: Self-pay | Admitting: Primary Care

## 2022-01-10 NOTE — Telephone Encounter (Signed)
Patient Name: Cameron [name]   Birth Date: 10/14/1995   Address: [address] [place], [place] [address]   Sex: Male   Rx Written Rx Dispensed Drug Quantity Days Supply Prescriber Name Prescriber Dea # Payment Method Dispenser   12/15/2021 12/17/2021 oxycodone hcl (ir) 10 mg tab  168 [address] MD [id number] Other [facility]   11/18/2021 11/19/2021 oxycodone hcl (ir) 10 mg tab  168 [address] MD [id number] Other [facility]   10/22/2021 10/22/2021 oxycodone hcl (ir) 10 mg tab  168 [address] MD [id number] Other [facility]   09/21/2021 09/25/2021 oxycodone hcl (ir) 10 mg tab  168 [address] MD [id number] Other [facility]   08/25/2021 08/28/2021 oxycodone hcl (ir) 10 mg tab  168 28 [name] MD [id number] Other [facility]   07/31/2021 07/31/2021 oxycodone hcl (ir) 10 mg tab  168 28 Glean Hess [id number] Other [facility]   06/23/2021 06/29/2021 oxycodone hcl (ir) 10 mg tab  168 28 [name] MD [id number] Other [facility]   06/15/2021 06/15/2021 oxycodone hcl (ir) 10 mg tab  84 14 [name] MD [id number] Other [facility]   06/01/2021 06/01/2021 oxycodone hcl (ir) 10 mg tab  84 14 [name] MD [id number] Other [facility]   05/16/2021 05/17/2021 oxycodone hcl (ir) 10 mg tab  84 14 [name] MD [id number] Other [facility]   05/01/2021 05/02/2021 oxycodone hcl (ir) 10 mg tab  84 14 Jodi Geralds [id number] Other [facility]   04/17/2021 04/17/2021 oxycodone hcl (ir) 10 mg tab  84 14 Midge Minium [id number] Other [facility]   03/31/2021 04/03/2021 oxycodone hcl (ir) 10 mg tab  84 14 Jodi Geralds [id number] Other [facility]   03/17/2021 03/17/2021 oxycodone hcl (ir) 10  mg tab  84 14 Jodi Geralds [id number] Other [facility]   03/02/2021 03/03/2021 oxycodone hcl (ir) 10 mg tab  84 14 Jodi Geralds [id number] Other [facility]   02/17/2021 02/17/2021 oxycodone hcl (ir) 10 mg tab  84 14 Jodi Geralds [id number] Other [facility]   02/06/2021 02/06/2021 oxycodone hcl (ir) 10 mg tab  84 14 Jodi Geralds [id number] Other [facility]   01/23/2021 01/23/2021 oxycodone hcl (ir) 10 mg tab  84 14 Jodi Geralds [id number] Other [facility]     * - Drugs marked with an asterisk are compound drugs. If the compound drug is made up of more than one controlled substance, then each controlled substance will be a separate row in the table.

## 2022-01-11 ENCOUNTER — Encounter: Payer: Self-pay | Admitting: Oncology

## 2022-01-11 ENCOUNTER — Other Ambulatory Visit: Payer: Self-pay

## 2022-01-11 MED ORDER — OXYCODONE HCL 10 MG PO TABS *I*
10.0000 mg | ORAL_TABLET | ORAL | 0 refills | Status: DC | PRN
Start: 2022-01-11 — End: 2022-02-01
  Filled 2022-01-11: qty 168, 28d supply, fill #0

## 2022-01-14 ENCOUNTER — Other Ambulatory Visit: Payer: Self-pay

## 2022-01-17 ENCOUNTER — Ambulatory Visit: Payer: Medicaid Other | Admitting: Student in an Organized Health Care Education/Training Program

## 2022-01-19 ENCOUNTER — Other Ambulatory Visit: Payer: Self-pay

## 2022-01-19 ENCOUNTER — Other Ambulatory Visit: Payer: Self-pay | Admitting: Primary Care

## 2022-01-19 MED ORDER — MESALAMINE 800 MG PO TBEC *I*
2400.0000 mg | DELAYED_RELEASE_TABLET | Freq: Two times a day (BID) | ORAL | 5 refills | Status: DC
Start: 2022-01-19 — End: 2022-06-14
  Filled 2022-01-19 – 2022-03-05 (×2): qty 180, 30d supply, fill #0
  Filled 2022-04-05: qty 180, 30d supply, fill #1
  Filled 2022-06-14: qty 180, 30d supply, fill #2

## 2022-01-23 ENCOUNTER — Other Ambulatory Visit: Payer: Self-pay

## 2022-01-25 ENCOUNTER — Telehealth: Payer: Self-pay | Admitting: Primary Care

## 2022-01-25 NOTE — Telephone Encounter (Signed)
Pt has no [facility] 2x and read MyChart message with no response to confirm interest in referral. Writer closing referral due to no response

## 2022-01-31 ENCOUNTER — Telehealth: Payer: Self-pay | Admitting: Primary Care

## 2022-01-31 NOTE — Telephone Encounter (Signed)
Pt says that he has been having cold sweats for the past week or so, along with non productive cough. Says that sweats soak through bed sheets. Having some "weird feelings" in chest not quite pain, denies SOB. Nephew had a cold but has been feeling better since.     Would like to be seen for concerns appt made for next day @1130  pt agrees.

## 2022-01-31 NOTE — Telephone Encounter (Signed)
[facility] TELEPHONE INTAKE    Reason for call: Pt states has been having cold sweats since Monday 5/8 with no other symptoms. Please advise    Name of caller: Cameron [name]  Relationship to patient: Self  Organization (if applicable):   Phone:  [phone]  Fax (if applicable):

## 2022-02-01 ENCOUNTER — Other Ambulatory Visit: Payer: Self-pay

## 2022-02-01 ENCOUNTER — Ambulatory Visit: Payer: Medicaid Other | Attending: Internal Medicine | Admitting: Internal Medicine

## 2022-02-01 VITALS — BP 136/66 | HR 77 | Temp 97.0°F | Wt 185.0 lb

## 2022-02-01 DIAGNOSIS — R61 Generalized hyperhidrosis: Secondary | ICD-10-CM | POA: Insufficient documentation

## 2022-02-01 DIAGNOSIS — B349 Viral infection, unspecified: Secondary | ICD-10-CM | POA: Insufficient documentation

## 2022-02-01 DIAGNOSIS — R0602 Shortness of breath: Secondary | ICD-10-CM | POA: Insufficient documentation

## 2022-02-01 DIAGNOSIS — Z20822 Contact with and (suspected) exposure to covid-19: Secondary | ICD-10-CM

## 2022-02-01 DIAGNOSIS — D5701 Hb-SS disease with acute chest syndrome: Secondary | ICD-10-CM | POA: Insufficient documentation

## 2022-02-01 DIAGNOSIS — M791 Myalgia, unspecified site: Secondary | ICD-10-CM

## 2022-02-01 DIAGNOSIS — D564 Hereditary persistence of fetal hemoglobin [HPFH]: Secondary | ICD-10-CM | POA: Insufficient documentation

## 2022-02-01 DIAGNOSIS — Z20828 Contact with and (suspected) exposure to other viral communicable diseases: Secondary | ICD-10-CM | POA: Insufficient documentation

## 2022-02-01 DIAGNOSIS — D5700 Hb-SS disease with crisis, unspecified: Secondary | ICD-10-CM

## 2022-02-01 LAB — DATE/TIME NOT PROVIDED

## 2022-02-01 MED ORDER — OXYCODONE HCL 10 MG PO TABS *I*
10.0000 mg | ORAL_TABLET | ORAL | 0 refills | Status: DC | PRN
Start: 2022-02-01 — End: 2022-05-03
  Filled 2022-02-08: qty 63, 7d supply, fill #0

## 2022-02-01 NOTE — ED Triage Notes (Signed)
Sent in by PMD 2 weeks, generalized body aches, chills. Denies fever. Denies n/v. 8/10 body aches.

## 2022-02-02 ENCOUNTER — Emergency Department
Admission: EM | Admit: 2022-02-02 | Discharge: 2022-02-02 | Disposition: A | Discharge status: Home / Self Care | Payer: Medicaid Other | Source: Ambulatory Visit | Attending: Emergency Medicine | Admitting: Emergency Medicine

## 2022-02-02 ENCOUNTER — Emergency Department: Payer: Medicaid Other

## 2022-02-02 ENCOUNTER — Telehealth: Payer: Self-pay | Admitting: Primary Care

## 2022-02-02 ENCOUNTER — Encounter: Payer: Self-pay | Admitting: Emergency Medicine

## 2022-02-02 DIAGNOSIS — R0602 Shortness of breath: Secondary | ICD-10-CM

## 2022-02-02 DIAGNOSIS — D5700 Hb-SS disease with crisis, unspecified: Secondary | ICD-10-CM

## 2022-02-02 DIAGNOSIS — B349 Viral infection, unspecified: Secondary | ICD-10-CM

## 2022-02-02 LAB — CBC AND DIFFERENTIAL
Baso # K/uL: 0.1 10*3/uL (ref 0.0–0.1)
Basophil %: 0.6 %
Eos # K/uL: 0.4 10*3/uL (ref 0.0–0.5)
Eosinophil %: 4.7 %
Hematocrit: 25 % — ABNORMAL LOW (ref 40–51)
Hemoglobin: 7.6 g/dL — ABNORMAL LOW (ref 13.7–17.5)
IMM Granulocytes #: 0 10*3/uL (ref 0.0–0.0)
IMM Granulocytes: 0.1 %
Lymph # K/uL: 1.8 10*3/uL (ref 1.3–3.6)
Lymphocyte %: 22.8 %
MCH: 20 pg — ABNORMAL LOW (ref 26–32)
MCHC: 30 g/dL — ABNORMAL LOW (ref 32–37)
MCV: 67 fL — ABNORMAL LOW (ref 79–92)
Mono # K/uL: 0.5 10*3/uL (ref 0.3–0.8)
Monocyte %: 6.8 %
Neut # K/uL: 5.1 10*3/uL (ref 1.8–5.4)
Nucl RBC # K/uL: 0 10*3/uL (ref 0.0–0.0)
Nucl RBC %: 0 /100 WBC (ref 0.0–0.2)
Platelets: 368 10*3/uL — ABNORMAL HIGH (ref 150–330)
RBC: 3.8 MIL/uL — ABNORMAL LOW (ref 4.6–6.1)
RDW: 21.3 % — ABNORMAL HIGH (ref 11.6–14.4)
Seg Neut %: 65 %
WBC: 7.8 10*3/uL (ref 4.2–9.1)

## 2022-02-02 LAB — BASIC METABOLIC PANEL
Anion Gap: 9 (ref 7–16)
CO2: 24 mmol/L (ref 20–28)
Calcium: 8.9 mg/dL — ABNORMAL LOW (ref 9.3–10.5)
Chloride: 104 mmol/L (ref 96–108)
Creatinine: 0.63 mg/dL — ABNORMAL LOW (ref 0.67–1.17)
Glucose: 93 mg/dL (ref 60–99)
Lab: 7 mg/dL (ref 6–20)
Potassium: 4.3 mmol/L (ref 3.3–5.1)
Sodium: 137 mmol/L (ref 133–145)
eGFR BY CREAT: 136 *

## 2022-02-02 LAB — RUQ PANEL (ED ONLY)
ALT: 227 U/L — ABNORMAL HIGH (ref 0–50)
AST: 130 U/L — ABNORMAL HIGH (ref 0–50)
Albumin: 3.6 g/dL (ref 3.5–5.2)
Alk Phos: 1542 U/L — ABNORMAL HIGH (ref 40–130)
Amylase: 90 U/L (ref 28–100)
Bili,Indirect: 1 mg/dL (ref 0.1–1.0)
Bilirubin,Direct: 1.2 mg/dL — ABNORMAL HIGH (ref 0.0–0.3)
Bilirubin,Total: 2.2 mg/dL — ABNORMAL HIGH (ref 0.0–1.2)
Lipase: 20 U/L (ref 13–60)
Total Protein: 8.7 g/dL — ABNORMAL HIGH (ref 6.3–7.7)

## 2022-02-02 LAB — COVID/INFLUENZA A & B/RSV NAAT (PCR)
COVID-19 NAAT (PCR): NEGATIVE
Influenza A NAAT (PCR): NEGATIVE
Influenza B NAAT (PCR): NEGATIVE
RSV NAAT (PCR): NEGATIVE

## 2022-02-02 LAB — RETICULOCYTES
Retic %: 3.1 % — ABNORMAL HIGH (ref 0.7–2.3)
Retic Abs: 117 10*3/uL (ref 33.8–124.0)

## 2022-02-02 MED ORDER — KETOROLAC TROMETHAMINE 30 MG/ML IJ SOLN *I*
30.0000 mg | Freq: Once | INTRAMUSCULAR | Status: AC
Start: 2022-02-02 — End: 2022-02-02
  Administered 2022-02-02: 30 mg via INTRAVENOUS
  Filled 2022-02-02: qty 1

## 2022-02-02 MED ORDER — SODIUM CHLORIDE 0.9 % IV BOLUS *I*
2000.0000 mL | Freq: Once | Status: AC
Start: 2022-02-02 — End: 2022-02-02
  Administered 2022-02-02: 2000 mL via INTRAVENOUS

## 2022-02-02 MED ORDER — HYDROMORPHONE HCL 2 MG/ML IJ SOLN *WRAPPED*
2.0000 mg | INTRAMUSCULAR | Status: DC | PRN
Start: 2022-02-02 — End: 2022-02-02

## 2022-02-02 NOTE — Discharge Instructions (Addendum)
Labs grossly reassuring     Chest x-ray reassuring on my read    Continue home medications and follow up with PCP    Please return to the ED should you have any further acute medical problems or any worsening symptoms, especially worsening/uncontrolled pain, chest pain, shortness of breath, fever, not able to tolerate eating/drinking, or any other concerns.     Should you have any further questions about your care or evaluation you should call or make an appointment with your primary physician to review them.

## 2022-02-02 NOTE — ED Notes (Signed)
Patient declined covid swab, provider aware

## 2022-02-02 NOTE — ED Provider Notes (Signed)
History     Chief Complaint   Patient presents with   . Generalized Body Aches     Cameron [name] is a 26 y.o. 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 Reports generalized body and joint ache on and off, c/w previous sickle cell crisis.  Seen by PCP earlier today and sent to ED for evaluation of shortness of breath that has been ongoing over the past 1 week.  Endorsed night sweats, denies hx of blood clots, leg swelling, chest pain, nausea, vomiting, fever, chills or any other complaints at this time.      History provided by:  Patient  Language interpreter used: No          Medical/Surgical/Family History     Past Medical History:   Diagnosis Date   . Cholelithiasis    . 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 Sickle cell disease with HPFH D57.1, D56.4   . Ulcerative colitis K51.90   . Pain medication agreement completed Z02.89   . Iron deficiency E61.1   . Primary sclerosing cholangitis K83.01            Past Surgical History:   Procedure Laterality Date   . CHOLECYSTECTOMY  2014     Family History   Problem Relation Age of Onset   . Other Mother    . Sickle cell anemia Sister    . Sickle cell anemia Brother    . Sickle cell anemia Brother           Social History     Tobacco Use   . Smoking status: Never   . Smokeless tobacco: Never   Substance Use Topics   . Alcohol use: No   . Drug use: No     Living Situation     Questions Responses    Patient lives with Family    Homeless No    Caregiver for other family member     External Services     Employment     Domestic Violence Risk                 Review of Systems   Review of Systems   Constitutional: Negative for chills and fever.   Respiratory: Positive for shortness of breath.    Musculoskeletal: Positive for  arthralgias and myalgias.   All other systems reviewed and are negative.      Physical Exam     Triage Vitals  Triage Start: Start, (02/01/22 2106)  First Recorded BP: 127/65, Resp: 18, Temp: 36.2 C (97.2 F), Temp src: TEMPORAL Oxygen Therapy SpO2: 100 %, Oximetry Source: Rt Hand, O2 Device: None (Room air), Heart Rate: 79, (02/01/22 2107)  .  First Pain Reported  0-10 Scale: 8, Pain Location/Orientation: Generalized, (02/01/22 2110)       Physical Exam  Vitals reviewed.   Constitutional:       General: He is not in acute distress.     Appearance: He is well-developed. He is not ill-appearing, toxic-appearing or diaphoretic.   HENT:      Head: Normocephalic and atraumatic.   Cardiovascular:      Rate and Rhythm: Normal rate and regular rhythm.      Pulses: Normal pulses.      Heart sounds: Normal  heart sounds.   Pulmonary:      Effort: Pulmonary effort is normal. No respiratory distress.      Breath sounds: Normal breath sounds. No stridor. No wheezing, rhonchi or rales.   Chest:      Chest wall: No tenderness.   Abdominal:      General: Bowel sounds are normal.      Palpations: Abdomen is soft.      Tenderness: There is no abdominal tenderness.   Musculoskeletal:         General: No swelling, deformity or signs of injury. Tenderness: generalized. Normal range of motion.      Cervical back: Normal range of motion and neck supple.      Right lower leg: No edema.      Left lower leg: No edema.   Skin:     General: Skin is warm and dry.   Neurological:      Mental Status: He is alert and oriented to person, place, and time.         Medical Decision Making   Patient seen by me on:  02/01/2022    Assessment:  Cameron [name] is a 26 y.o. 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    Differential  diagnosis:    Viral syndrome/COVID-19  Sickle cell pain crisis  Doubt acute chest syndrome/PE    Plan:    Orders Placed This Encounter      *Chest standard frontal and lateral views      CBC and differential      Basic metabolic panel      RUQ panel (ED only)      Reticulocytes      Initiate COVID precautions    Medications  HYDROmorphone (DILAUDID) injection 2 mg (has no administration in time range)  ketorolac (TORADOL) 30 mg/mL injection 30 mg (30 mg Intravenous Given 02/02/22 0115)  sodium chloride 0.9 % bolus 2,000 mL (0 mLs Intravenous Stopped 02/02/22 0155)    Review of existing & external labs / records: Labs grossly reassuring    Independent interpretation of imaging: Chest x-ray without new infiltrate on my read.    ED Course and Disposition:  No hypoxia, tachycardia and patient will likely be discharged.  Plan discussed with complex care. Work note given. Patient educated on reasons to return to the ED.  Patient understands and agrees with plan.           ED Course as of 02/02/22 0219   Fri Feb 02, 2022   0041 SpO2: 100 %   0041 Heart Rate: 79   0121 COVID-19 NAAT (PCR)  Declined, had it done earlier today   0218 *Chest standard frontal and lateral views  nad     Labs Reviewed   CBC AND DIFFERENTIAL - Abnormal; Notable for the following components:       Result Value    RBC 3.8 (*)     Hemoglobin 7.6 (*)     Hematocrit 25 (*)     MCV 67 (*)     MCH 20 (*)     MCHC 30 (*)     RDW 21.3 (*)     Platelets 368 (*)     All other components within normal limits   BASIC METABOLIC PANEL - Abnormal; Notable for the following components:    Creatinine  0.63 (*)     Calcium 8.9 (*)     All other components within normal limits   RUQ PANEL (ED ONLY) - Abnormal; Notable for the following components:    Total Protein 8.7 (*)     Bilirubin,Total 2.2 (*)     Bilirubin,Direct 1.2 (*)     Alk Phos 1,542 (*)     AST 130 (*)     ALT 227 (*)     All other components within normal limits   RETICULOCYTES - Abnormal; Notable for the  following components:    Retic % 3.1 (*)     All other components within normal limits     *Chest standard frontal and lateral views    (Results Pending)         Bonnee Quin, PA             [facility], [place], [place]  02/02/22 [phone]

## 2022-02-02 NOTE — Telephone Encounter (Signed)
Spoke with pt, asking to be kept out of work [employer] and tomorrow  Pt was seen in office yesterday  Note provided for pt via [employer]

## 2022-02-02 NOTE — Telephone Encounter (Signed)
[facility] TELEPHONE TRIAGE     Reason for call: Patient states patient is experiencing increased pain. Patient states patient does not feel like patient will be able to complete work shift tonight. Patient inquired if patient could receive letter from nurse or PCP for excuse of absence.     Name of caller: Reko Traficante III  Phone: [phone]

## 2022-02-08 ENCOUNTER — Encounter: Payer: Self-pay | Admitting: Primary Care

## 2022-02-08 ENCOUNTER — Telehealth: Payer: Self-pay | Admitting: Primary Care

## 2022-02-08 ENCOUNTER — Other Ambulatory Visit: Payer: Self-pay

## 2022-02-08 ENCOUNTER — Encounter: Payer: Self-pay | Admitting: Internal Medicine

## 2022-02-08 NOTE — Telephone Encounter (Signed)
[facility] TELEPHONE INTAKE    Reason for call: Patient called because he wanted to know if his script for Oxy is for 7 days or 30 day. I explained to patient that his last script was for 7 days. Patient states he's confused and would like to speak with a nurse or PCP. Please advise.    Name of caller: Jeriah    Phone:  [phone]

## 2022-02-08 NOTE — Telephone Encounter (Addendum)
I have attempted to contact this patient by phone with the following results: no answer (wireless customer not available).    Mychart message also sent.

## 2022-02-08 NOTE — Progress Notes (Signed)
[facility]    Office Visit Note     REASON FOR VISIT      Chief Complaint   Patient presents with   . URI       PRIMARY DIAGNOSIS      Sickle cell anemia    Subjective     SUBJECTIVE       Endorses achiness  No fever  Blurry vision, for a few months  Ears are okay  Rhinorrhea present  Throat is okay  Voice no change  No nausea vomiting diarrhea  Endorses shortness of breath  Endorses night sweats for 2 weeks  Endorses feeling cold at the time of the sweats    Typically takes 1 pill of 10 mg oxycodone every 4-5 hours, 4 to 6 pills a day  He has been taking 5 to 7/day in the past couple weeks  Has not tested for COVID at home      Past Medical History, Social History, Family History, and Medications/allergies reviewed during this visit    Current Outpatient Medications   Medication   . mesalamine (ASACOL HD) 800 mg tablet   . ferrous sulfate 325 (65 FE) mg tablet   . cyclobenzaprine (FLEXERIL) 5 mg tablet   . Melatonin 10 MG CAPS   . naloxone (NARCAN) 4 mg/0.1 mL nasal spray   . oxyCODONE (ROXICODONE) 10 mg immediate release tablet   . diclofenac (VOLTAREN) 1 % gel     No current facility-administered medications for this visit.          Objective     OBJECTIVE      BP 136/66   Pulse 77   Temp 36.1 C (97 F) (Temporal)   Wt 83.9 kg (185 lb)   SpO2 100%   BMI 24.41 kg/m     Physical Exam  Constitutional:       Appearance: He is not ill-appearing.   HENT:      Head: Atraumatic.   Pulmonary:      Effort: Pulmonary effort is normal. No respiratory distress.      Breath sounds: Rales (Very quiet, noted on the posterior right mid chest) present.   Neurological:      Mental Status: He is alert. Mental status is at baseline.   Psychiatric:         Mood and Affect: Mood normal.              ASSESSMENT / DIAGNOSIS     1. 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 SOB (shortness of breath)  - *Chest standard frontal and lateral views; Future  - CBC and differential; Future  - COVID/Influenza A & B/RSV NAAT (PCR); Future  - COVID/Influenza A & B/RSV NAAT (PCR)    3. Night sweats  - *Chest standard frontal and lateral views; Future  - CBC and differential; Future  - COVID/Influenza A & B/RSV NAAT (PCR); Future  - COVID/Influenza A & B/RSV NAAT (PCR)      Orders Placed This Encounter   . COVID/Influenza A & B/RSV NAAT (PCR)   . *Chest standard frontal and lateral views   . CBC and differential   . Date/time not provided   . oxyCODONE (ROXICODONE) 10 mg immediate  release tablet     There are no Patient Instructions on file for this visit.       --Patient instructed to call if symptoms are not improving or worsening  --Follow-up arranged  No follow-ups on file.     I personally spent 31 minutes on the calendar day of the encounter, including pre and post visit work reviewing the EMR and management of this patient.     Electronically signed by Jacki Cones, MD , 02/08/2022 @   [facility], Phone: [phone]

## 2022-02-09 ENCOUNTER — Encounter: Payer: Self-pay | Admitting: Oncology

## 2022-02-09 ENCOUNTER — Encounter: Payer: Self-pay | Admitting: Internal Medicine

## 2022-02-09 ENCOUNTER — Other Ambulatory Visit: Payer: Self-pay

## 2022-02-09 MED ORDER — OXYCODONE-ACETAMINOPHEN 10-325 MG PO TABS *A*
1.0000 | ORAL_TABLET | ORAL | 0 refills | Status: DC | PRN
Start: 2022-02-09 — End: 2022-03-05
  Filled 2022-02-09: qty 168, 28d supply, fill #0

## 2022-02-09 NOTE — Telephone Encounter (Signed)
Oxycodone is not covered by Medicaid  I spoke to patient  Will switch to Oxycodone/APAP 10/325 mg - 1 tab every 4 hours PRN    He is aware that because [facility] considers this his first fill he might only be able to receive 7 days supply

## 2022-02-09 NOTE — Telephone Encounter (Signed)
Handled in a phone task .

## 2022-02-09 NOTE — Telephone Encounter (Signed)
Spoke with pt he says that he does not have pain medications left at this time. Knows that he has a refill called in but was told that this needs a prior auth. Says that this is for a 7 day supply.    Pt says that he is comfortable with 7 day supply but was getting med for a 1 month supply in the past and would like to make sure that PCP wanted him to have 7 days vs 30.     Will route to PCP for review.

## 2022-02-09 NOTE — Telephone Encounter (Signed)
Is has been handled in a phone task.

## 2022-02-09 NOTE — Addendum Note (Signed)
Addended by: [name] on: 02/09/2022 04:29 PM     Modules accepted: Orders

## 2022-02-09 NOTE — Telephone Encounter (Signed)
Writer spoke to patient. Patient requested to follow up with nurse today to further discuss increased pain and prescription.   [phone]

## 2022-02-09 NOTE — Addendum Note (Signed)
Addended by: [name] on: 02/09/2022 04:19 PM     Modules accepted: Orders

## 2022-02-10 ENCOUNTER — Other Ambulatory Visit: Payer: Self-pay

## 2022-02-11 ENCOUNTER — Telehealth: Payer: Self-pay | Admitting: Oncology

## 2022-02-11 NOTE — Telephone Encounter (Signed)
[facility]             Telephone Encounter     History   After hours on call     02/11/22   10:22 AM      Late entry- call eceived 02/09/22 at 1757    Troi called to notify office that all med refills require PA.  Will forward to our PA specialist      Palma Holter, NP

## 2022-02-12 NOTE — Telephone Encounter (Signed)
[facility]  Prior Authorization Request    Date of request: 02/12/2022  Time of call: 9:24 AM    Medication Requiring PA: oxyCODONE (ROXICODONE) 10 mg immediate release tablet    Directions(dosing and frequency): Take 1-1.5 tablets (10-15 mg total) by mouth every 4 hours as needed for Pain Max daily dose: 90 mg (9 tablets)    Quantity/ Day Supply: 63 Tablets    Dx code: D57.1, D56.4    Request received from:[facility] PA Source: Pharmacy    Name and Phone Number of Pharmacy: [facility] [phone]    Additional Pharmacy Benefit plan: no    Secondary insurance: No    Please run secondary if not covered under primary: no    Insurance coverage confirmed: yes    Covered Alternative Medication:     Tax ID: [id number]    Provider and NPIWindell Moulding- [id number]    Additional Justification:n/a    Key ID:     Please provide justification for PA submission & urgency or indicate which covered alternative was ordered    This submission is for continuation of pain management therapy on which the patient has been stable and prevents hospitalization for the manifestations of underlying sickle cell disease.  Per the CDC statement in April 2019 opioid therapy is clinically indicated in the care of patients with sickle cell disease and their recommendations should NOT be utilized to deny these patients access to opioid therapy.  Lack of access to this medication will result in increased hospital utilization, increase risk of significant morbidity and mortality.

## 2022-03-02 ENCOUNTER — Telehealth: Payer: Self-pay | Admitting: Primary Care

## 2022-03-02 NOTE — Telephone Encounter (Signed)
[facility] TELEPHONE INTAKE    Reason for call: Patient called and says he has [place] vision only can read if its close up. It has been going on the last few weeks and he would like to be reffered to an eye doctor.  Name of caller: Cameron [name]  Relationship to patient: Self  Organization (if applicable):   [phone]

## 2022-03-05 ENCOUNTER — Encounter: Payer: Self-pay | Admitting: Primary Care

## 2022-03-05 ENCOUNTER — Other Ambulatory Visit: Payer: Self-pay

## 2022-03-05 ENCOUNTER — Other Ambulatory Visit: Payer: Self-pay | Admitting: Primary Care

## 2022-03-05 NOTE — Telephone Encounter (Signed)
Patient Demographic Information [facility])    [facility] First Name Last Name Birth Date Gender Street Address [facility] Zip Code   Cameron [name] May 10, 1996 Male [address] [place] [address]        Search:  PDI My Rx Current Rx Drug Type Rx Written Rx Dispensed Drug Quantity Days Supply Prescriber Name Prescriber [facility] # Payment Method Dispenser   Cameron U Kelle Darting 02/09/2022 02/09/2022 oxycodone-acetaminophen 10-325 mg tab  168 [address] MD [id number] Other [facility]   Cameron [name] 01/11/2022 01/14/2022 oxycodone hcl (ir) 10 mg tab  168 28 [name] MD [id number] Other [facility]   Cameron [name] 12/15/2021 12/17/2021 oxycodone hcl (ir) 10 mg tab  168 28 [name] MD [id number] Other [facility]   Cameron [name] 11/18/2021 11/19/2021 oxycodone hcl (ir) 10 mg tab  168 28 [name] MD [id number] Other [facility]   Cameron [name] 10/22/2021 10/22/2021 oxycodone hcl (ir) 10 mg tab  168 28 [name] MD [id number] Other [facility]   Cameron [name] 09/21/2021 09/25/2021 oxycodone hcl (ir) 10 mg tab  168 28 [name] MD [id number] Other [facility]   Cameron [name] 08/25/2021 08/28/2021 oxycodone hcl (ir) 10 mg tab  168 28 [name] MD [id number] Other [facility]   Cameron [name] 07/31/2021 07/31/2021 oxycodone hcl (ir) 10 mg tab  168 28 Glean Hess [id number] Other [facility]   Cameron U [name] 06/23/2021 06/29/2021 oxycodone hcl (ir) 10 mg tab  168 28 [name] MD [id number] Other [facility]   Cameron [name] 06/15/2021 06/15/2021 oxycodone hcl (ir) 10 mg tab  84 14 [name] MD [id number] Other [facility]   Cameron [name] 06/01/2021 06/01/2021 oxycodone hcl (ir) 10 mg tab  84 14 [name] MD [id number] Other [facility]   Cameron [name] 05/16/2021 05/17/2021 oxycodone hcl (ir) 10 mg tab  84 14  [name] MD [id number] Other [facility]   Cameron [name] 05/01/2021 05/02/2021 oxycodone hcl (ir) 10 mg tab  84 14 Jodi Geralds [id number] Other [facility]   Cameron U [name] 04/17/2021 04/17/2021 oxycodone hcl (ir) 10 mg tab  84 14 Midge Minium [id number] Other [facility]   Cameron U [name] 03/31/2021 04/03/2021 oxycodone hcl (ir) 10 mg tab  84 14 Jodi Geralds [id number] Other [facility]   Cameron U [name] 03/17/2021 03/17/2021 oxycodone hcl (ir) 10 mg tab  84 14 Jodi Geralds [id number] Other [facility]     Showing 1 to 16 of 16 entries     * - Details of Drug Type : O = Opioid, B = Benzodiazepine, S = Stimulant   * - Drugs marked with an asterisk are compound drugs. If the compound drug is made up of more than one controlled substance, then each controlled substance will be Cameron separate row in the table.

## 2022-03-05 NOTE — Progress Notes (Signed)
[facility]  Prior Authorization Request    Date of request: 03/05/2022  Time of call: 3:04 PM     Medication Requiring PA: mesalamine (ASACOL HD) 800 mg tablet    Directions(dosing and frequency): Take 3 tablets (2,400 mg total) by mouth 2 times daily    Quantity/ Day Supply: 180 Tablets    Dx code:    Request received from:[facility] PA Source: Pharmacy    Name and Phone Number of Pharmacy: [facility] [phone]      Additional Pharmacy Benefit plan: no    Secondary insurance: No    Please run secondary if not covered under primary: no    Insurance coverage confirmed: yes    Covered Alternative Medication:     Tax ID: [id number]    Provider and NPIWindell Moulding- [id number]    Additional Justification:n/a    Key ID:     Please provide justification for PA submission & urgency or indicate which covered alternative was ordered

## 2022-03-06 ENCOUNTER — Other Ambulatory Visit: Payer: Self-pay

## 2022-03-07 ENCOUNTER — Encounter: Payer: Self-pay | Admitting: Oncology

## 2022-03-07 DIAGNOSIS — D571 Sickle-cell disease without crisis: Secondary | ICD-10-CM

## 2022-03-07 NOTE — Telephone Encounter (Signed)
The Drug Utilization Report below displays all of the controlled substance prescriptions, if any, that your patient has filled in the last twelve months. The information displayed on this report is compiled from pharmacy submissions to the Department, and accurately reflects the information as submitted by the pharmacies.   This report was requested by: Cameron [name]  Reference #: [id number]   Practitioner Count: 1   Pharmacy Count: 1   Current Opioid Prescriptions: 1   Current Benzodiazepine Prescriptions: 0   Current Stimulant Prescriptions: 0         Patient Demographic Information [facility])    PDI First Name Last Name Birth Date Gender Street Address [facility] Zip Code   [name] [birth year] Male [address] [place] [address]        Search:  PDI My Rx Current Rx Drug Type Rx Written Rx Dispensed Drug Quantity Days Supply Prescriber Name Prescriber [facility] # Payment Method Dispenser   A U Y O 02/09/2022 02/09/2022 oxycodone-acetaminophen 10-325 mg tab  168 [address] MD [id number] Other [facility]   [name] 01/11/2022 01/14/2022 oxycodone hcl (ir) 10 mg tab  168 28 [name] MD [id number] Other [facility]   [name] 12/15/2021 12/17/2021 oxycodone hcl (ir) 10 mg tab  168 28 [name] MD [id number] Other [facility]   [name] 11/18/2021 11/19/2021 oxycodone hcl (ir) 10 mg tab  168 28 [name] MD [id number] Other [facility]   [name] 10/22/2021 10/22/2021 oxycodone hcl (ir) 10 mg tab  168 28 [name] MD [id number] Other [facility]   [name] 09/21/2021 09/25/2021 oxycodone hcl (ir) 10 mg tab  168 28 [name] MD [id number] Other [facility]   [name] 08/25/2021 08/28/2021 oxycodone hcl (ir) 10 mg tab  168 28 [name] MD [id number] Other [facility]   [name] 07/31/2021 07/31/2021 oxycodone hcl (ir) 10  mg tab  168 28 Cameron [name] [id number] Other [facility]   [name] 06/23/2021 06/29/2021 oxycodone hcl (ir) 10 mg tab  168 [address] MD [id number] Other [facility]   [name] 06/15/2021 06/15/2021 oxycodone hcl (ir) 10 mg tab  84 14 [name] MD [id number] Other [facility]   [name] 06/01/2021 06/01/2021 oxycodone hcl (ir) 10 mg tab  84 14 [name] MD [id number] [facility]   [name] 05/16/2021 05/17/2021 oxycodone hcl (ir) 10 mg tab  84 14 [name] MD [id number] [facility]   [name] 05/01/2021 05/02/2021 oxycodone hcl (ir) 10 mg tab  84 14 Cameron [name] [id number] [facility]   [name] 04/17/2021 04/17/2021 oxycodone hcl (ir) 10 mg tab  84 14 Cameron [name] [id number] Other [facility]   [name] 03/31/2021 04/03/2021 oxycodone hcl (ir) 10 mg tab  84 14 Cameron [name] [id number] Other [facility]   [name] 03/17/2021 03/17/2021 oxycodone hcl (ir) 10 mg tab  84 14 Cameron [name] M  [id number] Other [facility]     Showing 1 to 16 of 16 entries     * - Details of Drug Type : O = Opioid, B = Benzodiazepine, S = Stimulant

## 2022-03-08 ENCOUNTER — Other Ambulatory Visit: Payer: Self-pay

## 2022-03-08 MED ORDER — OXYCODONE-ACETAMINOPHEN 10-325 MG PO TABS *A*
1.0000 | ORAL_TABLET | ORAL | 0 refills | Status: DC | PRN
Start: 2022-03-08 — End: 2022-04-05
  Filled 2022-03-08: qty 168, 28d supply, fill #0

## 2022-03-09 ENCOUNTER — Other Ambulatory Visit: Payer: Self-pay

## 2022-03-19 ENCOUNTER — Encounter: Payer: Self-pay | Admitting: Oncology

## 2022-03-19 ENCOUNTER — Telehealth: Payer: Self-pay | Admitting: Primary Care

## 2022-03-19 NOTE — Telephone Encounter (Signed)
There is a Clinical cytogeneticist message that is handling this same concern.

## 2022-03-19 NOTE — Telephone Encounter (Signed)
Please reference Patient Advice message for more information regarding this telephone encounter.     [facility] TELEPHONE TRIAGE     Reason for call: Patient requests to receive letter from PCP for missing work due to pain on 6/25. Patient requests to receive call once completed.     Name of caller: Cameron [name]  Phone:  [phone]

## 2022-03-26 ENCOUNTER — Encounter: Payer: Self-pay | Admitting: Oncology

## 2022-03-26 NOTE — Telephone Encounter (Signed)
Writer spoke to patient. Patient requests to receive notification once letter is written for absence at work. Patient states patient called out of work due to [facility] flare up.   [phone]

## 2022-04-02 ENCOUNTER — Ambulatory Visit: Payer: Medicaid Other | Attending: Oncology | Admitting: Oncology

## 2022-04-02 ENCOUNTER — Encounter: Payer: Self-pay | Admitting: Oncology

## 2022-04-02 ENCOUNTER — Other Ambulatory Visit: Payer: Self-pay

## 2022-04-02 VITALS — BP 116/61 | HR 88 | Temp 97.3°F | Wt 187.0 lb

## 2022-04-02 DIAGNOSIS — D564 Hereditary persistence of fetal hemoglobin [HPFH]: Secondary | ICD-10-CM | POA: Insufficient documentation

## 2022-04-02 DIAGNOSIS — D571 Sickle-cell disease without crisis: Secondary | ICD-10-CM | POA: Insufficient documentation

## 2022-04-02 LAB — PAIN CLINIC PROFILE
Amphetamine,UR: NEGATIVE
Benzodiazepinen,UR: NEGATIVE
Cocaine/Metab,UR: NEGATIVE
Opiates,UR: NEGATIVE
Oxycodone/Oxymorphone,UR: POSITIVE
THC Metabolite,UR: POSITIVE

## 2022-04-02 LAB — MICROALBUMIN, URINE, RANDOM
Creatinine,UR: 18 mg/dL — ABNORMAL LOW (ref 20–300)
Microalbumin,UR: <1.2 mg/dL

## 2022-04-02 NOTE — Progress Notes (Signed)
[facility]    Visit Note     REASON FOR VISIT      Chief Complaint   Patient presents with   . Follow-up     {Telemedicine Consent (Optional):41261}    PRIMARY DIAGNOSIS      ***    Subjective     SUBJECTIVE      ***        ROS  Review of Systems as per HPI above    Past Medical History, Social History, Family History, and Medications/allergies reviewed during this visit    Current Outpatient Medications   Medication   . oxyCODONE-acetaminophen (PERCOCET) 10-325 mg per tablet   . oxyCODONE (ROXICODONE) 10 mg immediate release tablet   . mesalamine (ASACOL HD) 800 mg tablet   . ferrous sulfate 325 (65 FE) mg tablet   . cyclobenzaprine (FLEXERIL) 5 mg tablet   . Melatonin 10 MG CAPS   . naloxone (NARCAN) 4 mg/0.1 mL nasal spray   . diclofenac (VOLTAREN) 1 % gel     No current facility-administered medications for this visit.          Objective     OBJECTIVE      BP 116/61   Pulse 88   Temp 36.3 C (97.3 F) (Temporal)   Wt 84.8 kg (187 lb)   SpO2 99%   BMI 24.67 kg/m     Physical Exam  {Telemed Exam (Optional):41265}         ASSESSMENT / DIAGNOSIS     There are no diagnoses linked to this encounter.    Orders Placed This Encounter   No orders placed during this encounter.       There are no Patient Instructions on file for this visit.       --Patient instructed to call if symptoms are not improving or worsening  --Follow-up arranged  No follow-ups on file.     I personally spent *** minutes on the calendar day of the encounter, including pre and post visit work reviewing the EMR and management of this patient.     Electronically signed by Palma Holter, NP , 04/02/2022,@   [facility], Phone: [phone]

## 2022-04-03 ENCOUNTER — Other Ambulatory Visit: Payer: Self-pay | Admitting: Primary Care

## 2022-04-03 NOTE — Telephone Encounter (Signed)
Patient Demographic Information [facility])    [facility] First Name Last Name Birth Date Gender Street Address [place] Zip Code   [name] 1995/11/06 Male [address] [place] [address]        Search:  PDI My Rx Current Rx Drug Type Rx Written Rx Dispensed Drug Quantity Days Supply Prescriber Name Prescriber [facility] # Payment Method Dispenser   [name] 03/08/2022 03/09/2022 oxycodone-acetaminophen 10-325 mg tab  168 [address] MD [id number] Other [facility]   [name] 02/09/2022 02/09/2022 oxycodone-acetaminophen 10-325 mg tab  168 28 [name] MD [id number] Other [facility]   [name] 01/11/2022 01/14/2022 oxycodone hcl (ir) 10 mg tab  168 28 [name] MD [id number] [facility]   [name] 12/15/2021 12/17/2021 oxycodone hcl (ir) 10 mg tab  168 28 [name] MD [id number] Other [facility]   [name] 11/18/2021 11/19/2021 oxycodone hcl (ir) 10 mg tab  168 28 [name] MD [id number] Other [facility]   [name] 10/22/2021 10/22/2021 oxycodone hcl (ir) 10 mg tab  168 28 [name] MD [id number] Other [facility]   [name] 09/21/2021 09/25/2021 oxycodone hcl (ir) 10 mg tab  168 28 [name] MD [id number] Other [facility]   [name] 08/25/2021 08/28/2021 oxycodone hcl (ir) 10 mg tab  168 28 [name] MD [id number] [facility]   [name] 07/31/2021 07/31/2021 oxycodone hcl (ir) 10 mg tab  168 28 Glean Hess [id number] [facility]   [name] 06/23/2021 06/29/2021 oxycodone hcl (ir) 10 mg tab  168 28 [name] MD [id number] [facility]   [name] 06/15/2021 06/15/2021 oxycodone hcl (ir) 10 mg tab  84 14 [name] MD [id number] Other [facility]   [name] 06/01/2021 06/01/2021 oxycodone hcl (ir) 10 mg tab   84 14 [name] MD [id number] [facility]   [name] 05/16/2021 05/17/2021 oxycodone hcl (ir) 10 mg tab  84 14 [name] MD [id number] [facility]   [name] 05/01/2021 05/02/2021 oxycodone hcl (ir) 10 mg tab  84 14 Jodi Geralds [id number] [facility]   [name] 04/17/2021 04/17/2021 oxycodone hcl (ir) 10 mg tab  84 14 Midge Minium [id number] Other [facility]     Showing 1 to 15 of 15 entries     * - Details of Drug Type : O = Opioid, B = Benzodiazepine, S = Stimulant   * - Drugs marked with an asterisk are compound drugs. If the compound drug is made up of more than one controlled substance, then each controlled substance will be a separate row in the table.

## 2022-04-04 LAB — CONFIRM THC METABOLITE, URINE: Confirm THC Metab: POSITIVE

## 2022-04-04 LAB — CONFIRM OPIATES: Confirm Opiates: POSITIVE

## 2022-04-05 ENCOUNTER — Other Ambulatory Visit: Payer: Self-pay | Admitting: Primary Care

## 2022-04-05 ENCOUNTER — Encounter: Payer: Self-pay | Admitting: Oncology

## 2022-04-05 ENCOUNTER — Other Ambulatory Visit: Payer: Self-pay

## 2022-04-05 MED ORDER — OXYCODONE-ACETAMINOPHEN 10-325 MG PO TABS *A*
1.0000 | ORAL_TABLET | ORAL | 0 refills | Status: DC | PRN
Start: 2022-04-05 — End: 2022-05-02
  Filled 2022-04-05: qty 168, 28d supply, fill #0

## 2022-04-05 NOTE — Telephone Encounter (Signed)
Patient Demographic Information [facility])    [facility] First Name Last Name Birth Date Gender Street Address [facility] Zip Code   [name] 1996-04-27 Male [address] [place] [address]        Search:  PDI My Rx Current Rx Drug Type Rx Written Rx Dispensed Drug Quantity Days Supply Prescriber Name Prescriber [facility] # Payment Method Dispenser   A [name] 03/08/2022 03/09/2022 oxycodone-acetaminophen 10-325 mg tab  168 [address] MD [id number] Other [facility]   A [name] 02/09/2022 02/09/2022 oxycodone-acetaminophen 10-325 mg tab  [address] MD [id number] Other [facility]   A [name] 01/11/2022 01/14/2022 oxycodone hcl (ir) 10 mg tab  168 [address] MD [id number] Other [facility]   A [name] 12/15/2021 12/17/2021 oxycodone hcl (ir) 10 mg tab  168 28 [name] MD [id number] Other [facility]   A [name] 11/18/2021 11/19/2021 oxycodone hcl (ir) 10 mg tab  168 28 [name] MD [id number] Other [facility]   A [name] 10/22/2021 10/22/2021 oxycodone hcl (ir) 10 mg tab  168 28 [name] MD [id number] [facility]   A [name] 09/21/2021 09/25/2021 oxycodone hcl (ir) 10 mg tab  168 [address] MD [id number] Other [facility]   A [name] 08/25/2021 08/28/2021 oxycodone hcl (ir) 10 mg tab  168 28 [name] MD [id number] Other [facility]   A [name] 07/31/2021 07/31/2021 oxycodone hcl (ir) 10 mg tab  168 28 Glean Hess [id number] Other [facility]   A U [name] 06/23/2021 06/29/2021 oxycodone hcl (ir) 10 mg tab  [address] MD [id number] Other [facility]   A [name] 06/15/2021 06/15/2021 oxycodone hcl (ir) 10 mg tab  84 14 [name] MD [id number] Other [facility]   A [name] 06/01/2021 06/01/2021 oxycodone hcl (ir) 10 mg tab   84 14 [name] MD [id number] [facility]   A [name] 05/16/2021 05/17/2021 oxycodone hcl (ir) 10 mg tab  84 14 [name] MD [id number] [facility]   A [name] 05/01/2021 05/02/2021 oxycodone hcl (ir) 10 mg tab  84 14 Jodi Geralds [id number] Other [facility]   A U [name] 04/17/2021 04/17/2021 oxycodone hcl (ir) 10 mg tab  84 14 Midge Minium [id number] Other [facility]

## 2022-04-06 ENCOUNTER — Other Ambulatory Visit: Payer: Self-pay

## 2022-04-11 ENCOUNTER — Ambulatory Visit: Payer: Medicaid Other | Admitting: Optometrist

## 2022-04-11 ENCOUNTER — Telehealth: Payer: Self-pay | Admitting: Optometrist

## 2022-04-11 NOTE — Telephone Encounter (Signed)
04/11/2022    I wanted you to be aware that the following patient has cancelled their appointment.    Provider Name: Everardo Beals    Date of Appointment: 04/11/2022   Patient Name: Cameron [name]   MRN: [id number]   DOB: Nov 26, 1995       Reason for Cancellation:  Conflict         Thank you,  Inge Rise

## 2022-04-12 ENCOUNTER — Other Ambulatory Visit: Payer: Self-pay

## 2022-04-12 ENCOUNTER — Ambulatory Visit: Payer: Medicaid Other | Admitting: Optometry

## 2022-04-12 DIAGNOSIS — H5213 Myopia, bilateral: Secondary | ICD-10-CM

## 2022-04-12 DIAGNOSIS — D5700 Hb-SS disease with crisis, unspecified: Secondary | ICD-10-CM

## 2022-04-12 DIAGNOSIS — D564 Hereditary persistence of fetal hemoglobin [HPFH]: Secondary | ICD-10-CM

## 2022-04-12 NOTE — Progress Notes (Signed)
Outpatient Visit      Patient name: Cameron [name]  DOB: [birth year]       Age: 26 y.o.  MR#: [id number]    Encounter Date: 04/12/2022    Subjective:      Chief Complaint   Patient presents with   . New Patient Visit     HPI    Cameron [name] is a 26 y.o. male new patient here for a comprehensive   eye exam.  Patient has history of sickle cell disease.    LEE: never    Patient states in the past few months he has been having blurred distance   vision.  He says he has never worn corrective lenses.  Denies eye pain,   redness, itching and tearing but says he gets intermittent burning in both   eyes.  Denies floaters, flashes of light and double vision.    Ocular Meds: None    Ocular Hx: No eye surgery / No eye trauma    Family Ocular Hx:  None that he knows of but the family has sickle cell    No DM / No CL wear    Never had an eye exam or worn glasses before.  Last edited by Sheppard Plumber, OD on 04/12/2022  9:11 AM.        has a current medication list which includes the following prescription(s): oxycodone-acetaminophen, oxycodone, mesalamine, ferrous sulfate, cyclobenzaprine, melatonin, diclofenac, and naloxone.     has No Known Allergies (drug, envir, food or latex).      Past Medical History:   Diagnosis Date   . Cholelithiasis    . 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 CHOLECYSTECTOMY  2014        Specialty Problems    None       ROS    Positive for: Eyes (CEE)  Negative for: Constitutional, Gastrointestinal, Neurological, Skin,   Genitourinary, Musculoskeletal, HENT, Endocrine, Cardiovascular,   Respiratory, Psychiatric, Allergic/Imm, Heme/Lymph  Last edited by Maree Erie on 04/12/2022  8:40 AM.         Objective:     Base Eye Exam     Visual Acuity (Snellen - Linear)       Right Left    Dist sc 20/60 -2 20/50    Dist ph sc 20/30 -2 20/30 -2    Near cc J1+ OU          Tonometry (Tonopen, 8:55 AM)       Right Left    Pressure 12 13           Pupils       Pupils Dark Light Shape React APD    Right PERRLA 3 2.5 Round Brisk None    Left PERRLA 3 2.5 Round Brisk None          Visual Fields (Counting fingers)       Left Right     Full Full          Extraocular Movement       Right Left     Full Full          Neuro/Psych     Oriented x3: Yes    Mood/Affect: Normal          Dilation     Both eyes: 2.5% Phenylephrine, 1.0% Tropicamide @ 8:56 AM  Additional Tests     Keratometry (Automated)       K1 Axis K2 Axis    Right 38.25 8 39.75 98    Left 38.00 169 39.75 79            Slit Lamp and Fundus Exam     External Exam       Right Left    External Normal ocular adnexae, lacrimal gland & drainage, orbits Normal ocular adnexae, lacrimal gland & drainage, orbits          Slit Lamp Exam       Right Left    Lids/Lashes Normal structure & position Normal structure & position    Conjunctiva/Sclera Normal bulbar/palpebral, conjunctiva, sclera Normal bulbar/palpebral, conjunctiva, sclera    Cornea Normal epithelium, stroma, endothelium, tear film Normal epithelium, stroma, endothelium, tear film    Anterior Chamber Clear & deep Clear & deep    Iris Normal shape, size, morphology Normal shape, size, morphology    Lens Normal cortex, nucleus, anterior/posterior capsule, clarity Normal cortex, nucleus, anterior/posterior capsule, clarity    Vitreous Clear Clear          Fundus Exam       Right Left    Disc Normal size, appearance, nerve fiber layer Normal size, appearance, nerve fiber layer    C/D Ratio 0.45 0.45    Macula Normal Normal    Vessels Normal Normal    Periphery Normal, small RPE hypertrophy in far periph temp Normal            Refraction     Manifest Refraction (Auto)       Sphere Cylinder Axis Dist VA    Right -1.75 -0.75 016 20/25+    Left -1.25 -1.25 160 20/25-   Pt had to be pushed to continue reading the chart.           Cycloplegic Refraction       Sphere Cylinder Axis Dist VA    Right -1.25 -0.75 016 20/20-    Left -0.75 -1.00 165 20/20-           Final Rx       Sphere Cylinder Axis Dist VA    Right -1.25 -0.75 016 20/20-    Left -0.75 -1.00 165 20/20-    Type: SVL    Expiration Date: 04/12/2024              Final Rx       Sphere Cylinder Axis Dist VA    Right -1.25 -0.75 016 20/20-    Left -0.75 -1.00 165 20/20-    Type: SVL    Expiration Date: 04/12/2024                No annotated images are attached to the encounter.      Assessment/Plan:      1. 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 Myopia, bilateral             PLAN:    1. No signs of sickle cell retinopathy OU. Monitor x 1 yr    2. Pt given spec Rx. Pt Rx available through MyChart. Monitor x 1-2 yrs with refraction.    Dilated fundus exam unremarkable OU. Monitor x 1-2 yrs

## 2022-04-16 DIAGNOSIS — H5213 Myopia, bilateral: Secondary | ICD-10-CM

## 2022-04-16 DIAGNOSIS — H52223 Regular astigmatism, bilateral: Secondary | ICD-10-CM

## 2022-04-20 ENCOUNTER — Other Ambulatory Visit: Payer: Self-pay

## 2022-04-25 ENCOUNTER — Encounter: Payer: Self-pay | Admitting: Oncology

## 2022-05-02 ENCOUNTER — Other Ambulatory Visit: Payer: Self-pay

## 2022-05-02 ENCOUNTER — Other Ambulatory Visit: Payer: Self-pay | Admitting: Internal Medicine

## 2022-05-02 MED ORDER — OXYCODONE-ACETAMINOPHEN 10-325 MG PO TABS *A*
1.0000 | ORAL_TABLET | ORAL | 0 refills | Status: DC | PRN
Start: 2022-05-02 — End: 2022-05-29
  Filled 2022-05-02: qty 168, 28d supply, fill #0

## 2022-05-02 NOTE — Telephone Encounter (Signed)
Confidential Drug Report  Search Terms: [name], 09/08/1996   Search Date: 05/02/2022 09:25:49 AM   Searching on behalf of: [id number] - [name]   The Drug Utilization Report below displays all of the controlled substance prescriptions, if any, that your patient has filled in the last twelve months. The information displayed on this report is compiled from pharmacy submissions to the Department, and accurately reflects the information as submitted by the pharmacies.   This report was requested by: Gerrie Nordmann  Reference #: [id number]   Practitioner Count: 2   Pharmacy Count: 1   Current Opioid Prescriptions: 1   Current Benzodiazepine Prescriptions: 0   Current Stimulant Prescriptions: 0         Patient Demographic Information [facility])  ???  [facility] First Name Last Name Birth Date Gender Street Address [facility] Zip Code   [name] 13-Mar-1996 Male [address] [place] [address]        Search:  PDI My Rx Current Rx Drug Type Rx Written Rx Dispensed Drug Quantity Days Supply Prescriber Name Prescriber [facility] # Payment Method Dispenser   A [name] 04/05/2022 04/06/2022 oxycodone-acetaminophen 10-325 mg tab  168 [name], [name], (MD) [id number] Other [facility]   [name] 03/08/2022 03/09/2022 oxycodone-acetaminophen 10-325 mg tab  168 [address] MD [id number] Other [facility]   A [name] 02/09/2022 02/09/2022 oxycodone-acetaminophen 10-325 mg tab  168 [address] MD [id number] Other [facility]   A [name] 01/11/2022 01/14/2022 oxycodone hcl (ir) 10 mg tab  168 [address] MD [id number] Other [facility]   A [name] 12/15/2021 12/17/2021 oxycodone hcl (ir) 10 mg tab  [address] MD [id number] Other [facility]   A [name] 11/18/2021 11/19/2021 oxycodone hcl (ir) 10 mg tab  168 28 [name] MD [id number] Other [facility]   A [name]  10/22/2021 10/22/2021 oxycodone hcl (ir) 10 mg tab  168 28 [name] MD [id number] [facility]   A [name] 09/21/2021 09/25/2021 oxycodone hcl (ir) 10 mg tab  168 [address] MD [id number] Other [facility]   A [name] 08/25/2021 08/28/2021 oxycodone hcl (ir) 10 mg tab  168 28 [name] MD [id number] Other [facility]   A [name] 07/31/2021 07/31/2021 oxycodone hcl (ir) 10 mg tab  168 28 Glean Hess [id number] [facility]   A U [name] 06/23/2021 06/29/2021 oxycodone hcl (ir) 10 mg tab  168 [address] MD [id number] Other [facility]   A [name] 06/15/2021 06/15/2021 oxycodone hcl (ir) 10 mg tab  84 14 [name] MD [id number] Other [facility]   A [name] 06/01/2021 06/01/2021 oxycodone hcl (ir) 10 mg tab  84 14 [name] MD [id number] Other [facility]   A [name] 05/16/2021 05/17/2021 oxycodone hcl (ir) 10 mg tab  84 14 [name] MD [id number] Other [facility]

## 2022-05-04 ENCOUNTER — Other Ambulatory Visit: Payer: Self-pay

## 2022-05-29 ENCOUNTER — Other Ambulatory Visit: Payer: Self-pay

## 2022-05-29 ENCOUNTER — Encounter: Payer: Self-pay | Admitting: Oncology

## 2022-05-29 MED ORDER — OXYCODONE-ACETAMINOPHEN 10-325 MG PO TABS *A*
1.0000 | ORAL_TABLET | ORAL | 0 refills | Status: DC | PRN
Start: 2022-05-29 — End: 2022-06-01
  Filled 2022-05-29: qty 168, 28d supply, fill #0

## 2022-05-29 NOTE — Telephone Encounter (Signed)
Confidential Drug Report  Search Terms: Javarious Huard, Sep 09, 1996   Search Date: 05/29/2022 14:08:01 PM   Searching on behalf of: [id number] - [name]   The Drug Utilization Report below displays all of the controlled substance prescriptions, if any, that your patient has filled in the last twelve months. The information displayed on this report is compiled from pharmacy submissions to the Department, and accurately reflects the information as submitted by the pharmacies.   This report was requested by: Blossom Hoops  Reference #: [id number]   Practitioner Count: 2   Pharmacy Count: 1   Current Opioid Prescriptions: 1   Current Benzodiazepine Prescriptions: 0   Current Stimulant Prescriptions: 0         Patient Demographic Information [facility])    [place] First Name Last Name Birth Date Gender Street Address [facility] Zip Code   [name] 1996/08/01 Male [address] [place] [address]        Search:  PDI My Rx Current Rx Drug Type Rx Written Rx Dispensed Drug Quantity Days Supply Prescriber Name Prescriber [facility] # Payment Method Dispenser   A [name] 05/02/2022 05/04/2022 oxycodone-acetaminophen 10-325 mg tab  168 [address] MD [id number] Other [facility]   A [name] 04/05/2022 04/06/2022 oxycodone-acetaminophen 10-325 mg tab  168 [name], [name], (MD) [id number] Other [facility]   [name] 03/08/2022 03/09/2022 oxycodone-acetaminophen 10-325 mg tab  168 [address] MD [id number] Other [facility]   A [name] 02/09/2022 02/09/2022 oxycodone-acetaminophen 10-325 mg tab  [address] MD [id number] Other [facility]   A [name] 01/11/2022 01/14/2022 oxycodone hcl (ir) 10 mg tab  168 28 [name] MD [id number] [facility]   A [name] 12/15/2021 12/17/2021 oxycodone hcl (ir) 10 mg tab  168 28 [name] MD [id number] Other [facility]   A  [name] 11/18/2021 11/19/2021 oxycodone hcl (ir) 10 mg tab  168 28 [name] MD [id number] [facility]   A [name] 10/22/2021 10/22/2021 oxycodone hcl (ir) 10 mg tab  168 28 [name] MD [id number] [facility]   A [name] 09/21/2021 09/25/2021 oxycodone hcl (ir) 10 mg tab  168 28 [name] MD [id number] [facility]   A [name] 08/25/2021 08/28/2021 oxycodone hcl (ir) 10 mg tab  168 28 [name] MD [id number] [facility]   A [name] 07/31/2021 07/31/2021 oxycodone hcl (ir) 10 mg tab  168 28 Glean Hess [id number] Other [facility]   A U [name] 06/23/2021 06/29/2021 oxycodone hcl (ir) 10 mg tab  168 28 [name] MD [id number] Other [facility]   A [name] 06/15/2021 06/15/2021 oxycodone hcl (ir) 10 mg tab  84 14 [name] MD [id number] Other [facility]   A [name] 06/01/2021 06/01/2021 oxycodone hcl (ir) 10 mg tab  84 14 [name] MD [id number] Other [facility]     Showing 1 to 14 of 14 entries     * - Details of Drug Type :  O = Opioid, B = Benzodiazepine, S = Stimulant   * - Drugs marked with an asterisk are compound drugs. If the compound drug is made up of more than one controlled substance, then each controlled substance will be a separate row in the table.    2023 [facility]

## 2022-05-31 NOTE — Telephone Encounter (Signed)
Writer received call from patient about his prescription for Oxycodone. Patient is requesting to go back to the Oxycodone 10mg  and not the Oxycodone-acetaminophen 10-325 mg  Asher Muir can be reached at [phone]

## 2022-06-01 ENCOUNTER — Other Ambulatory Visit: Payer: Self-pay

## 2022-06-01 MED ORDER — OXYCODONE HCL 10 MG PO TABS *I*
10.0000 mg | ORAL_TABLET | ORAL | 0 refills | Status: DC | PRN
Start: 2022-06-01 — End: 2022-06-14
  Filled 2022-06-01: qty 84, 14d supply, fill #0

## 2022-06-01 NOTE — Telephone Encounter (Addendum)
Patient called the answering service again. There are multiple encounters surrounding this refill. Please call him back and confirm we will be sending in oxycodone (without acetaminophen) for a 14 day supply only    Please call [facility] OP pharmacy and cancel Scofield's original script for oxy/acet.    I've sent it in to [facility] OP.    [name], MD

## 2022-06-01 NOTE — Telephone Encounter (Signed)
Patient calling again. He would like a call back about his medication.   He can be reached at [phone]

## 2022-06-01 NOTE — Addendum Note (Signed)
Addended by: Katha Cabal on: 06/01/2022 02:30 PM     Modules accepted: Orders

## 2022-06-14 ENCOUNTER — Other Ambulatory Visit: Payer: Self-pay

## 2022-06-14 ENCOUNTER — Other Ambulatory Visit: Payer: Self-pay | Admitting: Primary Care

## 2022-06-14 NOTE — Telephone Encounter (Signed)
Patient Demographic Information [facility])       [facility] First Name Last Name Birth Date Gender Street Address [facility] Zip Code   A Corderra Vonstein [birth year] Male [address] [place] [address]        Search:  PDI My Rx Current Rx Drug Type Rx Written Rx Dispensed Drug Quantity Days Supply Prescriber Name Prescriber [facility] # Payment Method Dispenser   A U Y O 06/01/2022 06/01/2022 oxycodone hcl (ir) 10 mg tab [address] [name] [id number] Other [facility]   A [name] 05/02/2022 05/04/2022 oxycodone-acetaminophen 10-325 mg tab 168 [address] MD [id number] Other [facility]   A [name] 04/05/2022 04/06/2022 oxycodone-acetaminophen 10-325 mg tab 168 28 [name], [name], (MD) [id number] Other [facility]   [name] 03/08/2022 03/09/2022 oxycodone-acetaminophen 10-325 mg tab 168 [address] MD [id number] Other [facility]   A [name] 02/09/2022 02/09/2022 oxycodone-acetaminophen 10-325 mg tab 168 [address] MD [id number] Other [facility]   A [name] 01/11/2022 01/14/2022 oxycodone hcl (ir) 10 mg tab 168 [address] MD [id number] Other [facility]   A [name] 12/15/2021 12/17/2021 oxycodone hcl (ir) 10 mg tab 168 28 [name] MD [id number] Other [facility]   A [name] 11/18/2021 11/19/2021 oxycodone hcl (ir) 10 mg tab 168 28 [name] MD [id number] Other [facility]   A [name] 10/22/2021 10/22/2021 oxycodone hcl (ir) 10 mg tab 168 28 [name] MD [id number] Other [facility]   A [name] 09/21/2021 09/25/2021 oxycodone hcl (ir) 10 mg tab 168 28 [name] MD [id number] Other [facility]   A [name] 08/25/2021 08/28/2021 oxycodone hcl (ir) 10 mg tab 168 28 [name] MD [id number] Other [facility]   A [name] 07/31/2021 07/31/2021 oxycodone hcl (ir) 10  mg tab 168 28 Glean Hess [id number] Other [facility]   A U [name] 06/23/2021 06/29/2021 oxycodone hcl (ir) 10 mg tab 168 [address] MD [id number] Other [facility]   A [name] 06/15/2021 06/15/2021 oxycodone hcl (ir) 10 mg tab 84 14 [name] MD [id number] Other [facility]

## 2022-06-15 ENCOUNTER — Other Ambulatory Visit: Payer: Self-pay

## 2022-06-15 NOTE — Telephone Encounter (Signed)
Writer spoke to patient. Patient relayed patient will be out of medication today. Patient requests PCP send pending prescriptions to pharmacy.

## 2022-06-16 ENCOUNTER — Telehealth: Payer: Self-pay | Admitting: Student in an Organized Health Care Education/Training Program

## 2022-06-16 ENCOUNTER — Other Ambulatory Visit: Payer: Self-pay

## 2022-06-16 MED ORDER — OXYCODONE HCL 10 MG PO TABS *I*
10.0000 mg | ORAL_TABLET | ORAL | 0 refills | Status: DC | PRN
Start: 2022-06-16 — End: 2022-06-28
  Filled 2022-06-16: qty 84, 14d supply, fill #0

## 2022-06-16 MED ORDER — MESALAMINE 800 MG PO TBEC *I*
2400.0000 mg | DELAYED_RELEASE_TABLET | Freq: Two times a day (BID) | ORAL | 5 refills | Status: DC
Start: 2022-06-16 — End: 2024-04-02
  Filled 2023-05-16: qty 180, 30d supply, fill #0

## 2022-06-16 NOTE — Telephone Encounter (Signed)
[facility]    Date of call:  06/16/2022  Time of call: 4:56 PM  Phone Number: [phone]    Patient Cameron [name] (DOB Jul 25, 1996) called reporting out of pain medication and due for refill    HISTORY   Medication refill  - refill requested on 9/21  - refill due 9/23  - no additional concerning symptoms other than his chronic pain    ASSESSMENT/PLAN   Medication refill  Refilled medication request from 9/21 in attempt to prevent ED visit/hospitalization  Discussed with pharmacy and discovered patient is not currently covered by medicaid  After discussion with patient he will pay out of pocket for his medication. States he has been needing to do that recently and has not discussed his medicaid coverage issues with anyone yet  Advised he call the medicaid office on Monday and in the mean time can obtain his meds    Patient advised to call back if symptoms worsen, change or progress.    Author: [name], DO  06/16/2022 at 4:56 PM

## 2022-06-20 ENCOUNTER — Telehealth: Payer: Self-pay | Admitting: Primary Care

## 2022-06-20 NOTE — Telephone Encounter (Signed)
[facility] TELEPHONE TRIAGE     Reason for call: Patient called and says he has the stomach bug starting this AM and he is wondering if there is any meds he can be prescribed to help. He says he fels terrible and is requesting a call back.    Name of caller: Zadin Knotts III  Relationship to patient: Self  Organization (if applicable):   Phone:  [phone]

## 2022-06-20 NOTE — Telephone Encounter (Signed)
Called and spoke with pt  Reporting that he was at work today when he started feeling ill  Vomiting, diarrhea, abdominal and back pain    Encouraged to give his stomach rest (do not eat for at least 12 hours)  Focus on hydrating with small frequent sips (is going to have someone pick up gatorade)  Rest    Once he does try to start eating, start small and very bland (gave some examples)  Reiterated that right now keeping hydrated is most important    Asking for something for nausea to be sent to [facility] outpatient pharmacy   Will check in tomorrow  Asked that he call the office if anything worsens or concerns him

## 2022-06-21 ENCOUNTER — Other Ambulatory Visit: Payer: Self-pay

## 2022-06-21 MED ORDER — ONDANSETRON 4 MG PO TBDP *I*
4.0000 mg | ORAL_TABLET | Freq: Three times a day (TID) | ORAL | 0 refills | Status: DC | PRN
Start: 2022-06-21 — End: 2023-10-07
  Filled 2022-06-21: qty 15, 5d supply, fill #0

## 2022-06-21 NOTE — Telephone Encounter (Signed)
Called and left message for pt  Requested a call back to the office   Office number provided   Dorian Pod, [place]  06/21/2022  11:54 AM

## 2022-06-21 NOTE — Telephone Encounter (Signed)
Done

## 2022-06-28 ENCOUNTER — Other Ambulatory Visit: Payer: Self-pay | Admitting: Student in an Organized Health Care Education/Training Program

## 2022-06-28 ENCOUNTER — Encounter: Payer: Self-pay | Admitting: Oncology

## 2022-06-28 NOTE — Telephone Encounter (Signed)
Patient Demographic Information [facility])       [facility] First Name Last Name Birth Date Gender Street Address [facility] Zip Code   Cameron [name] 1996-05-17 Male [address] [place] [address]        Search:  PDI My Rx Current Rx Drug Type Rx Written Rx Dispensed Drug Quantity Days Supply Prescriber Name Prescriber [facility] # Payment Method Dispenser   Cameron [name] 06/16/2022 06/16/2022 oxycodone hcl (ir) 10 mg tab 84 14 [name] [id number] Other [facility]   Cameron U [name] 06/01/2022 06/01/2022 oxycodone hcl (ir) 10 mg tab 84 14 [name] [id number] Other [facility]   Cameron [name] 05/02/2022 05/04/2022 oxycodone-acetaminophen 10-325 mg tab 168 28 [name] MD [id number] Other [facility]   Cameron [name] 04/05/2022 04/06/2022 oxycodone-acetaminophen 10-325 mg tab 168 28 [name], [name], (MD) [id number] Other [facility]   Cameron U [name] 03/08/2022 03/09/2022 oxycodone-acetaminophen 10-325 mg tab 168 28 [name] MD [id number] Other [facility]   Cameron [name] 02/09/2022 02/09/2022 oxycodone-acetaminophen 10-325 mg tab 168 28 [name] MD [id number] Other [facility]   Cameron [name] 01/11/2022 01/14/2022 oxycodone hcl (ir) 10 mg tab 168 28 [name] MD [id number] Other [facility]   Cameron [name] 12/15/2021 12/17/2021 oxycodone hcl (ir) 10 mg tab 168 28 [name] MD [id number] Other [facility]   Cameron [name] 11/18/2021 11/19/2021 oxycodone hcl (ir) 10 mg tab 168 28 [name] MD [id number] Other [facility]   Cameron [name] 10/22/2021 10/22/2021 oxycodone hcl (ir) 10 mg tab 168 28 [name] MD [id number] Other [facility]   Cameron [name] 09/21/2021 09/25/2021 oxycodone hcl (ir) 10 mg tab 168 28 [name] MD [id number] Other [facility]   Cameron [name] 08/25/2021 08/28/2021 oxycodone hcl (ir) 10 mg tab  168 28 [name] MD [id number] Other [facility]   Cameron [name] 07/31/2021 07/31/2021 oxycodone hcl (ir) 10 mg tab 168 28 Glean Hess [id number] Other [facility]   Cameron U [name] 06/23/2021 06/29/2021 oxycodone hcl (ir) 10 mg tab 168 28 [name] MD [id number] Other [facility]     Showing 1 to 14 of 14 entries     * - Details of Drug Type : O = Opioid, B = Benzodiazepine, S = Stimulant  * - Drugs marked with an asterisk are compound drugs. If the compound drug is made up of more than one controlled substance, then each controlled substance will be Cameron separate row in the table.

## 2022-06-29 ENCOUNTER — Other Ambulatory Visit: Payer: Self-pay

## 2022-06-29 ENCOUNTER — Encounter: Payer: Self-pay | Admitting: Oncology

## 2022-06-29 MED ORDER — OXYCODONE HCL 10 MG PO TABS *I*
10.0000 mg | ORAL_TABLET | ORAL | 0 refills | Status: DC | PRN
Start: 2022-06-29 — End: 2022-07-10
  Filled 2022-06-29: qty 84, 14d supply, fill #0

## 2022-06-29 NOTE — Telephone Encounter (Signed)
Duplicate encounter  Medications sent in today 10/6 by PCP  Closing encounter    Leonard Schwartz, RN

## 2022-06-30 ENCOUNTER — Other Ambulatory Visit: Payer: Self-pay

## 2022-07-02 ENCOUNTER — Ambulatory Visit: Payer: Self-pay

## 2022-07-03 NOTE — Telephone Encounter (Signed)
I spoke with Cameron [name] to see if he had any insight to why his Medicaid is inactive and/or received any mail for them. He was unsure so I reached to [facility] via email and currently waiting for a response.

## 2022-07-10 ENCOUNTER — Other Ambulatory Visit: Payer: Self-pay

## 2022-07-10 ENCOUNTER — Other Ambulatory Visit: Payer: Self-pay | Admitting: Primary Care

## 2022-07-10 MED ORDER — OXYCODONE HCL 10 MG PO TABS *I*
10.0000 mg | ORAL_TABLET | ORAL | 0 refills | Status: DC | PRN
Start: 2022-07-10 — End: 2022-07-24
  Filled 2022-07-10: qty 84, 14d supply, fill #0

## 2022-07-10 NOTE — Telephone Encounter (Signed)
Patient Demographic Information [facility])       [facility] First Name Last Name Birth Date Gender Street Address [facility] Zip Code   [name] [birth year] Male [address] [place] [address]        Search:  PDI My Rx Current Rx Drug Type Rx Written Rx Dispensed Drug Quantity Days Supply Prescriber Name Prescriber [facility] # Payment Method Dispenser   A U Y O 06/29/2022 06/30/2022 oxycodone hcl (ir) 10 mg tab 84 14 [name] MD [id number] Other [facility]   A [name] 06/16/2022 06/16/2022 oxycodone hcl (ir) 10 mg tab 84 14 [name] [id number] Other [facility]   A U [name] 06/01/2022 06/01/2022 oxycodone hcl (ir) 10 mg tab 84 14 [name] [id number] Other [facility]   A [name] 05/02/2022 05/04/2022 oxycodone-acetaminophen 10-325 mg tab 168 28 [name] MD [id number] Other [facility]   A [name] 04/05/2022 04/06/2022 oxycodone-acetaminophen 10-325 mg tab 168 28 [name], [name], (MD) [id number] Other [facility]   [name] 03/08/2022 03/09/2022 oxycodone-acetaminophen 10-325 mg tab 168 28 [name] MD [id number] Other [facility]   A [name] 02/09/2022 02/09/2022 oxycodone-acetaminophen 10-325 mg tab 168 28 [name] MD [id number] Other [facility]   A [name] 01/11/2022 01/14/2022 oxycodone hcl (ir) 10 mg tab 168 28 [name] MD [id number] Other [facility]   A [name] 12/15/2021 12/17/2021 oxycodone hcl (ir) 10 mg tab 168 28 [name] MD [id number] Other [facility]   A [name] 11/18/2021 11/19/2021 oxycodone hcl (ir) 10 mg tab 168 28 [name] MD [id number] Other [facility]   A [name] 10/22/2021 10/22/2021 oxycodone hcl (ir) 10 mg tab 168 28 [name] MD [id number] Other [facility]   A [name] 09/21/2021 09/25/2021 oxycodone hcl (ir) 10 mg tab  168 28 [name] MD [id number] Other [facility]   A [name] 08/25/2021 08/28/2021 oxycodone hcl (ir) 10 mg tab 168 28 [name] MD [id number] Other [facility]   A [name] 07/31/2021 07/31/2021 oxycodone hcl (ir) 10 mg tab 168 28 Glean Hess [id number] Other [facility]     Showing 1 to 14 of 14 entries     * - Details of Drug Type : O = Opioid, B = Benzodiazepine, S = Stimulant  * - Drugs marked with an asterisk are compound drugs. If the compound drug is made up of more than one controlled substance, then each controlled substance will be a separate row in the table.

## 2022-07-14 ENCOUNTER — Other Ambulatory Visit: Payer: Self-pay

## 2022-07-16 ENCOUNTER — Ambulatory Visit: Payer: Self-pay | Admitting: Oncology

## 2022-07-16 ENCOUNTER — Other Ambulatory Visit: Payer: Self-pay

## 2022-07-16 VITALS — BP 123/59 | HR 92 | Temp 97.3°F | Ht 73.0 in | Wt 181.9 lb

## 2022-07-16 DIAGNOSIS — D571 Sickle-cell disease without crisis: Secondary | ICD-10-CM

## 2022-07-16 DIAGNOSIS — D564 Hereditary persistence of fetal hemoglobin [HPFH]: Secondary | ICD-10-CM

## 2022-07-16 DIAGNOSIS — R519 Headache, unspecified: Secondary | ICD-10-CM

## 2022-07-16 NOTE — Progress Notes (Signed)
[facility]    Visit Note     REASON FOR VISIT      Chief Complaint   Patient presents with    Follow-up         PRIMARY DIAGNOSIS          Subjective     SUBJECTIVE      Headaches  Occurs randomly throughout the week- can be at random times  Would say that most frequently headaches occur around noon  Does not eat breakfast  Feel as though they are a band across forehead  Light makes it worse  Sound does not impact the headache  Slowly builds from a small pain to a severe pounding pain  No nausea/vomiting  Tylenol helps it go away- sometimes  The other day it felt a train was pounding in his head  Occurring at least 3x a week  No numbness /tingling /confusion         ROS  Review of Systems as per HPI above    Past Medical History, Social History, Family History, and Medications/allergies reviewed during this visit    Current Outpatient Medications   Medication    mesalamine (ASACOL HD) 800 mg tablet    ferrous sulfate 325 (65 FE) mg tablet    Melatonin 10 MG CAPS    oxyCODONE (ROXICODONE) 10 mg immediate release tablet    ondansetron (ZOFRAN-ODT) 4 MG disintegrating tablet    cyclobenzaprine (FLEXERIL) 5 mg tablet    diclofenac (VOLTAREN) 1 % gel    naloxone (NARCAN) 4 mg/0.1 mL nasal spray     No current facility-administered medications for this visit.          Objective     OBJECTIVE      BP 123/59   Pulse 92   Temp 36.3 C (97.3 F) (Temporal)   Ht 1.854 m (6\' 1" )   Wt 82.5 kg (181 lb 14.4 oz)   SpO2 99%   BMI 24.00 kg/m     Physical Exam  Constitutional:       General: He is not in acute distress.     Appearance: He is not ill-appearing, toxic-appearing or diaphoretic.   Skin:     General: Skin is warm and dry.      Coloration: Skin is not jaundiced or pale.   Neurological:      General: No focal deficit present.      Mental Status: He is alert and oriented to person, place, and time. Mental status is at baseline.      Cranial Nerves: No cranial nerve deficit.      Sensory: No sensory  deficit.      Motor: No weakness.      Coordination: Coordination normal.      Gait: Gait normal.                ASSESSMENT / DIAGNOSIS     1. Sickle cell disease with HPFH  Underlying condition, all treatment decisions made with this in consideration       2. Headache  Mostly consistent with migraine headaches  Will discuss with pharmD treatment options given possibility for medication interaction and underlying conditions  Discussed s/s to seek emergent care  No indication for imaging at this time      Orders Placed This Encounter   No orders placed during this encounter.       There are no Patient Instructions on file for this visit.       --  Patient instructed to call if symptoms are not improving or worsening  --Follow-up arranged  No follow-ups on file.     I personally spent 20 minutes on the calendar day of the encounter, including pre and post visit work reviewing the EMR and management of this patient.     Electronically signed by Palma Holter, NP on 07/17/2022 at 10:44 AM     [facility], Phone: [phone]

## 2022-07-17 ENCOUNTER — Other Ambulatory Visit: Payer: Self-pay

## 2022-07-17 ENCOUNTER — Encounter: Payer: Self-pay | Admitting: Oncology

## 2022-07-17 MED ORDER — AMITRIPTYLINE HCL 25 MG PO TABS *I*
25.0000 mg | ORAL_TABLET | Freq: Every evening | ORAL | 0 refills | Status: AC
Start: 2022-07-17 — End: 2023-01-13
  Filled 2022-07-17: qty 30, 30d supply, fill #0

## 2022-07-17 NOTE — Telephone Encounter (Signed)
Letter drafted and sent to patient as requested by PCP.     Gary Fleet, RN

## 2022-07-17 NOTE — Progress Notes (Signed)
I met with Cameron [name] to discuss Medicaid, as his Medicaid is currently inactive. I check the [place] system  ad it states his Medicaid expired 05/24/2022. I asked Cameron [name] did he receive any correspondence in the mail from [place] and he states that he did not. I sent an email to Medicaid on last week to inquire, the reason his Medicaid has termed.  I have not received a response. Today I sent another email in hope that I will receive a response  and made Cameron [name] aware that I have done so. I will follow up with Cameron [name] when I receive a response.

## 2022-07-24 ENCOUNTER — Other Ambulatory Visit: Payer: Self-pay | Admitting: Primary Care

## 2022-07-24 ENCOUNTER — Other Ambulatory Visit: Payer: Self-pay

## 2022-07-24 MED ORDER — OXYCODONE HCL 10 MG PO TABS *I*
10.0000 mg | ORAL_TABLET | ORAL | 0 refills | Status: DC | PRN
Start: 2022-07-24 — End: 2022-08-07
  Filled 2022-07-24: qty 84, 14d supply, fill #0

## 2022-07-24 NOTE — Telephone Encounter (Signed)
Confidential Drug Report  Search Terms: Enri Chute, December 27, 1995  Search Date: 07/24/2022 08:25:08 AM  Searching on behalf of: [id number] - [name]  The Drug Utilization Report below displays all of the controlled substance prescriptions, if any, that your patient has filled in the last twelve months. The information displayed on this report is compiled from pharmacy submissions to the Department, and accurately reflects the information as submitted by the pharmacies.  This report was requested by: Gerrie Nordmann  Reference #: [id number]  Practitioner Count: 3  Pharmacy Count: 1  Current Opioid Prescriptions: 0  Current Benzodiazepine Prescriptions: 0  Current Stimulant Prescriptions: 0        Patient Demographic Information [facility])       PDI First Name Last Name Birth Date Gender Street Address [facility] Zip Code   [name] [birth year] Male [address] [place] [address]        Search:  PDI My Rx Current Rx Drug Type Rx Written Rx Dispensed Drug Quantity Days Supply Prescriber Name Prescriber [facility] # Payment Method Dispenser   [name] 06/29/2022 06/30/2022 oxycodone hcl (ir) 10 mg tab 84 14 [name] MD [id number] Other [facility]   A [name] 06/16/2022 06/16/2022 oxycodone hcl (ir) 10 mg tab 84 14 [name] [id number] Other [facility]   [name] 06/01/2022 06/01/2022 oxycodone hcl (ir) 10 mg tab 84 14 [name] [id number] Other [facility]   A [name] 05/02/2022 05/04/2022 oxycodone-acetaminophen 10-325 mg tab 168 28 [name] MD [id number] Other [facility]   A [name] 04/05/2022 04/06/2022 oxycodone-acetaminophen 10-325 mg tab 168 28 [name], [name], (MD) [id number] Other [facility]   [name] 03/08/2022 03/09/2022 oxycodone-acetaminophen 10-325 mg tab 168 28 [name] MD [id number] Other [facility]   A [name] 02/09/2022 02/09/2022  oxycodone-acetaminophen 10-325 mg tab 168 28 [name] MD [id number] Other [facility]   A [name] 01/11/2022 01/14/2022 oxycodone hcl (ir) 10 mg tab 168 28 [name] MD [id number] Other [facility]   A [name] 12/15/2021 12/17/2021 oxycodone hcl (ir) 10 mg tab 168 28 [name] MD [id number] Other [facility]   A [name] 11/18/2021 11/19/2021 oxycodone hcl (ir) 10 mg tab 168 28 [name] MD [id number] Other [facility]   A [name] 10/22/2021 10/22/2021 oxycodone hcl (ir) 10 mg tab 168 28 [name] MD [id number] Other [facility]   A [name] 09/21/2021 09/25/2021 oxycodone hcl (ir) 10 mg tab 168 28 [name] MD [id number] Other [facility]   A [name] 08/25/2021 08/28/2021 oxycodone hcl (ir) 10 mg tab 168 28 [name] MD [id number] Other [facility]   A [name] 07/31/2021 07/31/2021 oxycodone hcl (ir) 10 mg tab 168 28 Glean Hess [id number] Other [facility]     Showing 1 to 14 of 14 entries     * - Details of Drug Type : O = Opioid, B = Benzodiazepine, S = Stimulant  * - Drugs marked with an asterisk are compound drugs. If the compound drug is  made up of more than one controlled substance, then each controlled substance will be a separate row in the table.

## 2022-07-27 ENCOUNTER — Other Ambulatory Visit: Payer: Self-pay

## 2022-07-28 ENCOUNTER — Other Ambulatory Visit: Payer: Self-pay

## 2022-08-07 ENCOUNTER — Other Ambulatory Visit: Payer: Self-pay | Admitting: Primary Care

## 2022-08-07 NOTE — Telephone Encounter (Signed)
Patient Demographic Information [facility])       [facility] First Name Last Name Birth Date Gender Street Address [facility] Zip Code   Cameron [name] [birth year] Male [address] [place] [address]        Search:  PDI My Rx Current Rx Drug Type Rx Written Rx Dispensed Drug Quantity Days Supply Prescriber Name Prescriber [facility] # Payment Method Dispenser   Cameron Cameron [name] 07/24/2022 07/28/2022 oxycodone hcl (ir) 10 mg tab 84 14 [name] [id number] Other [facility]   Cameron N [name] 07/10/2022 07/14/2022 oxycodone hcl (ir) 10 mg tab 84 14 [name] MD [id number] Other [facility]   Cameron N [name] 06/29/2022 06/30/2022 oxycodone hcl (ir) 10 mg tab 84 14 [name] MD [id number] Other [facility]   Cameron N [name] 06/16/2022 06/16/2022 oxycodone hcl (ir) 10 mg tab 84 14 [name] [id number] Other [facility]   Cameron N [name] 06/01/2022 06/01/2022 oxycodone hcl (ir) 10 mg tab 84 14 [name] [id number] Other [facility]   Cameron Cameron [name] 05/02/2022 05/04/2022 oxycodone-acetaminophen 10-325 mg tab 168 28 [name] MD [id number] Other [facility]   Cameron Cameron [name] 04/05/2022 04/06/2022 oxycodone-acetaminophen 10-325 mg tab 168 28 [name], [name], (MD) [id number] Other [facility]   Cameron N [name] 03/08/2022 03/09/2022 oxycodone-acetaminophen 10-325 mg tab 168 28 [name] MD [id number] Other [facility]   Cameron N [name] 02/09/2022 02/09/2022 oxycodone-acetaminophen 10-325 mg tab 168 28 [name] MD [id number] Other [facility]   Cameron Cameron [name] 01/11/2022 01/14/2022 oxycodone hcl (ir) 10 mg tab 168 28 [name] MD [id number] Other [facility]   Cameron Cameron [name] 12/15/2021 12/17/2021 oxycodone hcl (ir) 10 mg tab 168 28 [name] MD [id number] Other [facility]   Cameron Cameron [name] 11/18/2021 11/19/2021 oxycodone hcl (ir) 10 mg tab 168 28  [name] MD [id number] Other [facility]   Cameron N [name] 10/22/2021 10/22/2021 oxycodone hcl (ir) 10 mg tab 168 28 [name] MD [id number] Other [facility]   Cameron N [name] 09/21/2021 09/25/2021 oxycodone hcl (ir) 10 mg tab 168 28 [name] MD [id number] Other [facility]   Cameron Cameron [name] 08/25/2021 08/28/2021 oxycodone hcl (ir) 10 mg tab 168 28 [name] MD [id number] Other [facility]     Showing 1 to 15 of 15 entries     * - Details of Drug Type : O = Opioid, B = Benzodiazepine, S = Stimulant  * - Drugs marked with an asterisk are compound drugs. If the compound drug is made up of more than one controlled substance, then each controlled substance will be Cameron separate row in the table.

## 2022-08-10 ENCOUNTER — Encounter: Payer: Self-pay | Admitting: Oncology

## 2022-08-11 ENCOUNTER — Other Ambulatory Visit: Payer: Self-pay

## 2022-08-11 MED ORDER — OXYCODONE HCL 10 MG PO TABS *I*
10.0000 mg | ORAL_TABLET | ORAL | 0 refills | Status: DC | PRN
Start: 2022-08-11 — End: 2022-08-22
  Filled 2022-08-11: qty 84, 14d supply, fill #0

## 2022-08-21 ENCOUNTER — Ambulatory Visit: Payer: Self-pay | Admitting: Primary Care

## 2022-08-21 NOTE — Progress Notes (Deleted)
[facility]    Visit Note     REASON FOR VISIT      No chief complaint on file.        PRIMARY DIAGNOSIS          Subjective     SUBJECTIVE      Headaches  Occurs randomly throughout the week- can be at random times  Would say that most frequently headaches occur around noon  Does not eat breakfast  Feel as though they are a band across forehead  Light makes it worse  Sound does not impact the headache  Slowly builds from a small pain to a severe pounding pain  No nausea/vomiting  Tylenol helps it go away- sometimes  The other day it felt a train was pounding in his head  Occurring at least 3x a week  No numbness /tingling /confusion         ROS  Review of Systems as per HPI above    Past Medical History, Social History, Family History, and Medications/allergies reviewed during this visit    Current Outpatient Medications   Medication    oxyCODONE (ROXICODONE) 10 mg immediate release tablet    amitriptyline (ELAVIL) 25 mg tablet    ondansetron (ZOFRAN-ODT) 4 MG disintegrating tablet    mesalamine (ASACOL HD) 800 mg tablet    ferrous sulfate 325 (65 FE) mg tablet    cyclobenzaprine (FLEXERIL) 5 mg tablet    Melatonin 10 MG CAPS    diclofenac (VOLTAREN) 1 % gel    naloxone (NARCAN) 4 mg/0.1 mL nasal spray     No current facility-administered medications for this visit.          Objective     OBJECTIVE      There were no vitals taken for this visit.    Physical Exam  Constitutional:       General: He is not in acute distress.     Appearance: He is not ill-appearing, toxic-appearing or diaphoretic.   Skin:     General: Skin is warm and dry.      Coloration: Skin is not jaundiced or pale.   Neurological:      General: No focal deficit present.      Mental Status: He is alert and oriented to person, place, and time. Mental status is at baseline.      Cranial Nerves: No cranial nerve deficit.      Sensory: No sensory deficit.      Motor: No weakness.      Coordination: Coordination normal.      Gait:  Gait normal.                ASSESSMENT / DIAGNOSIS     1. Sickle cell disease with HPFH  Underlying condition, all treatment decisions made with this in consideration       2. Headache  Mostly consistent with migraine headaches  Will discuss with pharmD treatment options given possibility for medication interaction and underlying conditions  Discussed s/s to seek emergent care  No indication for imaging at this time      Orders Placed This Encounter   No orders placed during this encounter.       There are no Patient Instructions on file for this visit.       --Patient instructed to call if symptoms are not improving or worsening  --Follow-up arranged  No follow-ups on file.     I personally spent 20 minutes on  the calendar day of the encounter, including pre and post visit work reviewing the EMR and management of this patient.     Electronically signed by Kyra Manges, MD on 08/21/2022 at 1:29 PM     [facility], Phone: [phone]

## 2022-08-22 ENCOUNTER — Other Ambulatory Visit: Payer: Self-pay | Admitting: Student in an Organized Health Care Education/Training Program

## 2022-08-23 NOTE — Telephone Encounter (Signed)
Confidential Drug Report  Search Terms: Cameron [name], [birth year]  Search Date: 08/23/2022 09:46:27 AM  Searching on behalf of: [id number] - [name]  The Drug Utilization Report below displays all of the controlled substance prescriptions, if any, that your patient has filled in the last twelve months. The information displayed on this report is compiled from pharmacy submissions to the Department, and accurately reflects the information as submitted by the pharmacies.  This report was requested by: Gerrie Nordmann  Reference #: [id number]  Practitioner Count: 3  Pharmacy Count: 1  Current Opioid Prescriptions: 1  Current Benzodiazepine Prescriptions: 0  Current Stimulant Prescriptions: 0        Patient Demographic Information [facility])       PDI First Name Last Name Birth Date Gender Street Address [facility] [place] Zip Code   [name] [birth year] Male [address] [place] [address]        Search:  PDI My Rx Current Rx Drug Type Rx Written Rx Dispensed Drug Quantity Days Supply Prescriber Name Prescriber [facility] # Payment Method Dispenser   A U Y O 08/11/2022 08/11/2022 oxycodone hcl (ir) 10 mg tab 84 14 [name] [id number] Other [facility]   A U [name] 07/24/2022 07/28/2022 oxycodone hcl (ir) 10 mg tab [address] [name] [id number] Other [facility]   A U [name] 07/10/2022 07/14/2022 oxycodone hcl (ir) 10 mg tab 84 14 [name] MD [id number] Other [facility]   A [name] 06/29/2022 06/30/2022 oxycodone hcl (ir) 10 mg tab 84 14 [name] MD [id number] Other [facility]   A [name] 06/16/2022 06/16/2022 oxycodone hcl (ir) 10 mg tab 84 14 [name] [id number] Other [facility]   [name] 06/01/2022 06/01/2022 oxycodone hcl (ir) 10 mg tab [address] [name] [id number] Other [facility]   A [name] 05/02/2022 05/04/2022 oxycodone-acetaminophen 10-325 mg tab 168 [address] MD [id number] Other [facility]   A [name] 04/05/2022 04/06/2022 oxycodone-acetaminophen 10-325 mg tab 168 [name], [name], (MD) [id number] Other [facility]   [name] 03/08/2022 03/09/2022 oxycodone-acetaminophen 10-325 mg tab 168 [address] MD [id number] Other [facility]   A [name] 02/09/2022 02/09/2022 oxycodone-acetaminophen 10-325 mg tab 168 [address] MD [id number] Other [facility]   A [name] 01/11/2022 01/14/2022 oxycodone hcl (ir) 10 mg tab 168 28 [name] MD [id number] Other [facility]   A [name] 12/15/2021 12/17/2021 oxycodone hcl (ir) 10 mg tab 168 28 [name] MD [id number] [facility]   A [name] 11/18/2021 11/19/2021 oxycodone hcl (ir) 10 mg tab 168 28 [name] MD [id number] Other [facility]   A [name] 10/22/2021 10/22/2021 oxycodone hcl (ir) 10 mg tab 168 28 [name] MD [id number] [facility]   A [name] 09/21/2021 09/25/2021 oxycodone hcl (ir) 10 mg tab 168 28 [name] MD [id number] [facility]   A [name] 08/25/2021 08/28/2021 oxycodone hcl (ir) 10 mg tab 168 28 [name] MD [id number] Other  [facility]     Showing 1 to 16 of 16 entries     * - Details of Drug Type : O = Opioid, B = Benzodiazepine, S = Stimulant  * - Drugs marked with an asterisk are compound drugs. If the compound drug is made up of more than one controlled substance, then each controlled substance will be a separate row in the table.

## 2022-08-24 ENCOUNTER — Other Ambulatory Visit: Payer: Self-pay

## 2022-08-24 ENCOUNTER — Encounter: Payer: Self-pay | Admitting: Oncology

## 2022-08-24 MED ORDER — OXYCODONE HCL 10 MG PO TABS *I*
10.0000 mg | ORAL_TABLET | ORAL | 0 refills | Status: DC | PRN
Start: 2022-08-24 — End: 2022-09-05
  Filled 2022-08-24: qty 84, 14d supply, fill #0

## 2022-08-25 ENCOUNTER — Other Ambulatory Visit: Payer: Self-pay

## 2022-09-05 ENCOUNTER — Other Ambulatory Visit: Payer: Self-pay

## 2022-09-05 NOTE — Telephone Encounter (Signed)
Patient Demographic Information [facility])       [facility] First Name Last Name Birth Date Gender Street Address [facility] Zip Code   [name] 02-Jan-1996 Male [address] [place] [address]        Search:  PDI My Rx Current Rx Drug Type Rx Written Rx Dispensed Drug Quantity Days Supply Prescriber Name Prescriber [facility] # Payment Method Dispenser   A [name] 08/24/2022 08/25/2022 oxycodone hcl (ir) 10 mg tab 84 14 [name] [id number] Other [facility]   A U [name] 08/11/2022 08/11/2022 oxycodone hcl (ir) 10 mg tab 84 14 [name] [id number] Other [facility]   A U [name] 07/24/2022 07/28/2022 oxycodone hcl (ir) 10 mg tab [address] [name] [id number] Other [facility]   A U [name] 07/10/2022 07/14/2022 oxycodone hcl (ir) 10 mg tab 84 14 [name] MD [id number] [facility]   A [name] 06/29/2022 06/30/2022 oxycodone hcl (ir) 10 mg tab 84 14 [name] MD [id number] [facility]   A [name] 06/16/2022 06/16/2022 oxycodone hcl (ir) 10 mg tab 84 14 [name] [id number] Other [facility]   [name] 06/01/2022 06/01/2022 oxycodone hcl (ir) 10 mg tab [address] [name] [id number] Other [facility]

## 2022-09-07 ENCOUNTER — Other Ambulatory Visit: Payer: Self-pay

## 2022-09-07 MED ORDER — OXYCODONE HCL 10 MG PO TABS *I*
10.0000 mg | ORAL_TABLET | ORAL | 0 refills | Status: DC | PRN
Start: 2022-09-07 — End: 2022-09-20
  Filled 2022-09-07: qty 84, 14d supply, fill #0

## 2022-09-08 ENCOUNTER — Other Ambulatory Visit: Payer: Self-pay

## 2022-09-20 ENCOUNTER — Other Ambulatory Visit: Payer: Self-pay | Admitting: Primary Care

## 2022-09-20 NOTE — Telephone Encounter (Signed)
Patient Demographic Information [facility])       [facility] First Name Last Name Birth Date Gender Street Address [facility] Zip Code   [name] 14-Sep-1996 Male [address] [place] [address]        Search:  PDI My Rx Current Rx Drug Type Rx Written Rx Dispensed Drug Quantity Days Supply Prescriber Name Prescriber [facility] # Payment Method Dispenser   A N Y O 09/07/2022 09/08/2022 oxycodone hcl (ir) 10 mg tab 84 14 [name] MD [id number] Other [facility]   [name] 08/24/2022 08/25/2022 oxycodone hcl (ir) 10 mg tab 84 14 [name] [id number] Other [facility]   [name] 08/11/2022 08/11/2022 oxycodone hcl (ir) 10 mg tab 84 14 [name] [id number] [facility]   [name] 07/24/2022 07/28/2022 oxycodone hcl (ir) 10 mg tab 84 14 [name] [id number] Other [facility]   [name] 07/10/2022 07/14/2022 oxycodone hcl (ir) 10 mg tab 84 14 [name] MD [id number] [facility]   [name] 06/29/2022 06/30/2022 oxycodone hcl (ir) 10 mg tab 84 14 [name] MD [id number] Other [facility]   [name] 06/16/2022 06/16/2022 oxycodone hcl (ir) 10 mg tab 84 14 [name] [id number] Other [facility]   [name] 06/01/2022 06/01/2022 oxycodone hcl (ir) 10 mg tab 84 14 [name] [id number] Other [facility]   [name] 05/02/2022 05/04/2022 oxycodone-acetaminophen 10-325 mg tab 168 28 [name] MD [id number] Other [facility]   [name] 04/05/2022 04/06/2022 oxycodone-acetaminophen 10-325 mg tab 168 28 [name], [name], (MD) [id number] Other [facility]   [name] 03/08/2022 03/09/2022 oxycodone-acetaminophen 10-325 mg tab 168 28 [name] MD [id number] Other [facility]   [name] 02/09/2022 02/09/2022 oxycodone-acetaminophen 10-325 mg tab 168 28 [name] MD [id number] Other [facility]   [name] 01/11/2022 01/14/2022 oxycodone hcl (ir) 10 mg tab 168 28 [name] MD [id number] Other [facility]   [name] 12/15/2021 12/17/2021 oxycodone hcl (ir) 10 mg tab 168 28 [name] MD [id number] Other [facility]   [name] 11/18/2021 11/19/2021 oxycodone hcl (ir) 10 mg tab 168 28 [name] MD [id number] Other [facility]   [name] 10/22/2021 10/22/2021 oxycodone hcl (ir) 10 mg tab 168 28 [name] MD [id number] [facility]   [name] 09/21/2021 09/25/2021 oxycodone hcl (ir) 10 mg tab 168 28 [name] MD [id number] Other [facility]     Showing 1 to 17 of 17 entries     * - Details of Drug Type : O = Opioid, B = Benzodiazepine, S = Stimulant  * - Drugs marked with an asterisk are compound drugs. If the compound drug is made up of more than one controlled substance, then each controlled substance will be a separate row in the table.

## 2022-09-21 ENCOUNTER — Other Ambulatory Visit: Payer: Self-pay

## 2022-09-21 MED ORDER — OXYCODONE HCL 10 MG PO TABS *I*
10.0000 mg | ORAL_TABLET | ORAL | 0 refills | Status: DC | PRN
Start: 2022-09-21 — End: 2022-10-03
  Filled 2022-09-21: qty 84, 14d supply, fill #0

## 2022-09-22 ENCOUNTER — Other Ambulatory Visit: Payer: Self-pay

## 2022-10-03 ENCOUNTER — Other Ambulatory Visit: Payer: Self-pay | Admitting: Primary Care

## 2022-10-03 ENCOUNTER — Other Ambulatory Visit: Payer: Self-pay

## 2022-10-03 MED ORDER — OXYCODONE HCL 10 MG PO TABS *I*
10.0000 mg | ORAL_TABLET | ORAL | 0 refills | Status: DC | PRN
Start: 2022-10-03 — End: 2022-10-17
  Filled 2022-10-03: qty 84, 14d supply, fill #0

## 2022-10-03 NOTE — Telephone Encounter (Signed)
Search Terms: Bibb Jirsa, Dec 23, 1995  Search Date: 10/03/2022 13:41:56 PM  Searching on behalf of: [id number]  The Drug Utilization Report below displays all of the controlled substance prescriptions, if any, that your patient has filled in the last twelve months. The information displayed on this report is compiled from pharmacy submissions to the Department, and accurately reflects the information as submitted by the pharmacies.  This report was requested by: [name]  Reference #: [id number]  Practitioner Count: 4  Pharmacy Count: 1  Current Opioid Prescriptions: 1  Current Benzodiazepine Prescriptions: 0  Current Stimulant Prescriptions: 0        Patient Demographic Information [facility])       PDI First Name Last Name Birth Date Gender Street Address [facility] Zip Code   [name] 03-02-1996 Male [address] [place] [address]        Search:  PDI My Rx Current Rx Drug Type Rx Written Rx Dispensed Drug Quantity Days Supply Prescriber Name Prescriber [facility] # Payment Method Dispenser   A [name] 09/21/2022 09/22/2022 oxycodone hcl (ir) 10 mg tab 84 14 [name] MD [id number] Other [facility]   A [name] 09/07/2022 09/08/2022 oxycodone hcl (ir) 10 mg tab 84 14 [name] MD [id number] Other [facility]   A [name] 08/24/2022 08/25/2022 oxycodone hcl (ir) 10 mg tab 84 14 [name] [id number] Other [facility]   A U [name] 08/11/2022 08/11/2022 oxycodone hcl (ir) 10 mg tab 84 14 [name] [id number] Other [facility]   A U [name] 07/24/2022 07/28/2022 oxycodone hcl (ir) 10 mg tab [address] [name] [id number] Other [facility]   A U [name] 07/10/2022 07/14/2022 oxycodone hcl (ir) 10 mg tab 84 14 [name] MD [id number] Other [facility]   A [name] 06/29/2022 06/30/2022 oxycodone hcl (ir) 10 mg tab 84 14 [name] MD [id number] Other  [facility]   A [name] 06/16/2022 06/16/2022 oxycodone hcl (ir) 10 mg tab 84 14 [name] [id number] Other [facility]   [name] 06/01/2022 06/01/2022 oxycodone hcl (ir) 10 mg tab [address] [name] [id number] Other [facility]   A [name] 05/02/2022 05/04/2022 oxycodone-acetaminophen 10-325 mg tab 168 [address] MD [id number] Other [facility]   A [name] 04/05/2022 04/06/2022 oxycodone-acetaminophen 10-325 mg tab 168 28 Jacki Cones, (MD) [id number] Other [facility]   [name] 03/08/2022 03/09/2022 oxycodone-acetaminophen 10-325 mg tab 168 [address] MD [id number] Other [facility]   A [name] 02/09/2022 02/09/2022 oxycodone-acetaminophen 10-325 mg tab 168 [address] MD [id number] Other [facility]   A [name] 01/11/2022 01/14/2022 oxycodone hcl (ir) 10 mg tab 168 28 [name] MD [id number] Other [facility]   A [name] 12/15/2021 12/17/2021 oxycodone hcl (ir) 10 mg tab 168 28 [name] MD [id number] Other [facility]   A [name] 11/18/2021 11/19/2021 oxycodone hcl (ir) 10 mg tab 168 [address] MD [id number] Other [facility]  [name] 10/22/2021 10/22/2021 oxycodone hcl (ir) 10 mg tab [address] MD [id number] Other [facility]

## 2022-10-06 ENCOUNTER — Other Ambulatory Visit: Payer: Self-pay

## 2022-10-17 ENCOUNTER — Other Ambulatory Visit: Payer: Self-pay

## 2022-10-17 NOTE — Telephone Encounter (Signed)
Confidential Drug Report  Search Terms: Cameron [name], Cameron [name]  Search Date: 10/17/2022 09:47:24 AM  Searching on behalf of: [id number] - [name]  The Drug Utilization Report below displays all of the controlled substance prescriptions, if any, that your patient has filled in the last twelve months. The information displayed on this report is compiled from pharmacy submissions to the Department, and accurately reflects the information as submitted by the pharmacies.  This report was requested by: Gerrie Nordmann  Reference #: [id number]  Practitioner Count: 4  Pharmacy Count: 1  Current Opioid Prescriptions: 1  Current Benzodiazepine Prescriptions: 0  Current Stimulant Prescriptions: 0        Patient Demographic Information [facility])       PDI First Name Last Name Birth Date Gender Street Address [facility] Zip Code   [name] [name] Male [address] [place] [address]        Search:  PDI My Rx Current Rx Drug Type Rx Written Rx Dispensed Drug Quantity Days Supply Prescriber Name Prescriber [facility] # Payment Method Dispenser   A [name] 10/03/2022 10/06/2022 oxycodone hcl (ir) 10 mg tab 84 14 [name] [id number] Other [facility]   A U [name] 09/21/2022 09/22/2022 oxycodone hcl (ir) 10 mg tab 84 14 [name] MD [id number] Other [facility]   A [name] 09/07/2022 09/08/2022 oxycodone hcl (ir) 10 mg tab 84 14 [name] MD [id number] [facility]   A [name] 08/24/2022 08/25/2022 oxycodone hcl (ir) 10 mg tab 84 14 [name] [id number] Other [facility]   A U [name] 08/11/2022 08/11/2022 oxycodone hcl (ir) 10 mg tab 84 14 [name] [id number] Other [facility]   A U [name] 07/24/2022 07/28/2022 oxycodone hcl (ir) 10 mg tab [address] [name] [id number] Other [facility]   A U [name] 07/10/2022 07/14/2022 oxycodone hcl (ir) 10 mg tab 84 14 [name]  MD [id number] Other [facility]   A [name] 06/29/2022 06/30/2022 oxycodone hcl (ir) 10 mg tab 84 14 [name] MD [id number] Other [facility]   A [name] 06/16/2022 06/16/2022 oxycodone hcl (ir) 10 mg tab 84 14 [name] [id number] Other [facility]   [name] 06/01/2022 06/01/2022 oxycodone hcl (ir) 10 mg tab [address] [name] [id number] Other [facility]   A [name] 05/02/2022 05/04/2022 oxycodone-acetaminophen 10-325 mg tab 168 [address] MD [id number] Other [facility]   A [name] 04/05/2022 04/06/2022 oxycodone-acetaminophen 10-325 mg tab 168 28 Jacki Cones, (MD) [id number] Other [facility]   [name] 03/08/2022 03/09/2022 oxycodone-acetaminophen 10-325 mg tab 168 28 [name] MD [id number] Other [facility]   A [name] 02/09/2022 02/09/2022 oxycodone-acetaminophen 10-325 mg tab 168 [address] MD [id number] Other [facility]   A [name] 01/11/2022 01/14/2022 oxycodone hcl (ir) 10 mg tab 168 28 [name] MD [id number] Other [facility]   A [name] 12/15/2021 12/17/2021 oxycodone hcl (ir) 10 mg tab 168 [address] MD [id number] [facility]   [name] 11/18/2021 11/19/2021 oxycodone hcl (ir) 10 mg tab 168 28 [name] MD [id number] Other [facility]   A [name] 10/22/2021 10/22/2021 oxycodone hcl (ir) 10 mg tab 168 28 [name] MD [id number] Other [facility]     Showing 1 to 18 of 18 entries     * - Details of Drug Type : O = Opioid, B = Benzodiazepine, S = Stimulant  * - Drugs marked with an asterisk are compound drugs. If the compound drug is made up of more than one controlled subst

## 2022-10-19 ENCOUNTER — Other Ambulatory Visit: Payer: Self-pay

## 2022-10-19 MED ORDER — OXYCODONE HCL 10 MG PO TABS *I*
10.0000 mg | ORAL_TABLET | ORAL | 0 refills | Status: DC | PRN
Start: 2022-10-19 — End: 2022-10-31
  Filled 2022-10-19: qty 84, 14d supply, fill #0

## 2022-10-20 ENCOUNTER — Other Ambulatory Visit: Payer: Self-pay

## 2022-10-23 ENCOUNTER — Other Ambulatory Visit: Payer: Self-pay

## 2022-10-31 ENCOUNTER — Other Ambulatory Visit: Payer: Self-pay | Admitting: Primary Care

## 2022-11-01 NOTE — Telephone Encounter (Signed)
Search Terms: Cameron [name], 1996/05/08  Search Date: 11/01/2022 08:32:23 AM  Searching on behalf of: [facility]  The Drug Utilization Report below displays all of the controlled substance prescriptions, if any, that your patient has filled in the last twelve months. The information displayed on this report is compiled from pharmacy submissions to the Department, and accurately reflects the information as submitted by the pharmacies.  This report was requested by: [name]  Reference #: [id number]  Practitioner Count: 3  Pharmacy Count: 1  Current Opioid Prescriptions: 1  Current Benzodiazepine Prescriptions: 0  Current Stimulant Prescriptions: 0        Patient Demographic Information [facility])       PDI First Name Last Name Birth Date Gender Street Address [facility] Zip Code   [name] 24-Jun-1996 Male [address] [place] [address]        Search:  PDI My Rx Current Rx Drug Type Rx Written Rx Dispensed Drug Quantity Days Supply Prescriber Name Prescriber [facility] # Payment Method Dispenser   A [name] 10/19/2022 10/20/2022 oxycodone hcl (ir) 10 mg tab 84 14 [name] MD [id number] [facility]   A [name] 10/03/2022 10/06/2022 oxycodone hcl (ir) 10 mg tab 84 14 [name] [id number] Other [facility]   A U [name] 09/21/2022 09/22/2022 oxycodone hcl (ir) 10 mg tab 84 14 [name] MD [id number] Other [facility]   A [name] 09/07/2022 09/08/2022 oxycodone hcl (ir) 10 mg tab 84 14 [name] MD [id number] Other [facility]   A [name] 08/24/2022 08/25/2022 oxycodone hcl (ir) 10 mg tab 84 14 [name] [id number] Other [facility]   A U [name] 08/11/2022 08/11/2022 oxycodone hcl (ir) 10 mg tab 84 14 [name] [id number] Other [facility]   A U [name] 07/24/2022 07/28/2022 oxycodone hcl (ir) 10 mg tab [address] [name] [id number] Other [facility]   A U [name] 07/10/2022 07/14/2022 oxycodone hcl (ir) 10 mg tab 84 14 [name] MD [id number] Other [facility]   A [name] 06/29/2022 06/30/2022 oxycodone hcl (ir) 10 mg tab 84 14 [name] MD [id number] Other [facility]   A [name] 06/16/2022 06/16/2022 oxycodone hcl (ir) 10 mg tab 84 14 [name] [id number] Other [facility]   [name] 06/01/2022 06/01/2022 oxycodone hcl (ir) 10 mg tab [address] [name] [id number] Other [facility]   A [name] 05/02/2022 05/04/2022 oxycodone-acetaminophen 10-325 mg tab 168 28 [name] MD [id number] Other [facility]   A [name] 04/05/2022 04/06/2022 oxycodone-acetaminophen 10-325 mg tab 168 28 Jacki Cones, (MD) [id number] Other [facility]   [name] 03/08/2022 03/09/2022 oxycodone-acetaminophen 10-325 mg tab 168 28 [name] MD [id number] Other [facility]   A [name] 02/09/2022 02/09/2022 oxycodone-acetaminophen 10-325 mg tab 168 28 [name] MD [id number] Other [facility]   A [name] 01/11/2022 01/14/2022 oxycodone hcl (ir) 10 mg tab 168 28 [name] MD [id number] Other [facility]   A  U [name] 12/15/2021 12/17/2021 oxycodone hcl (ir) 10 mg tab 168 28 [name] MD [id number] Other [facility]   A [name] 11/18/2021 11/19/2021 oxycodone hcl (ir) 10 mg tab 168 [address] MD [id number] Other [facility]     Showing 1 to 18 of 18 entries

## 2022-11-03 ENCOUNTER — Other Ambulatory Visit: Payer: Self-pay

## 2022-11-03 MED ORDER — OXYCODONE HCL 10 MG PO TABS *I*
10.0000 mg | ORAL_TABLET | ORAL | 0 refills | Status: DC | PRN
Start: 2022-11-03 — End: 2022-11-14
  Filled 2022-11-03: qty 84, 14d supply, fill #0

## 2022-11-14 ENCOUNTER — Other Ambulatory Visit: Payer: Self-pay | Admitting: Primary Care

## 2022-11-14 NOTE — Telephone Encounter (Signed)
Patient Demographic Information [facility])   [facility] First Name Last Name Birth Date Gender Street Address [facility] Zip Code   [name] July 03, 1996 Male [address] [place] [address]       Search:  PDI My Rx Current Rx Drug Type Rx Written Rx Dispensed Drug Quantity Days Supply Prescriber Name Prescriber [facility] # Payment Method Dispenser   [name] 11/03/2022 11/03/2022 oxycodone hcl (ir) 10 mg tab 84 14 [name] MD [id number] Other [facility]   [name] 10/19/2022 10/20/2022 oxycodone hcl (ir) 10 mg tab 84 14 [name] MD [id number] Other [facility]   [name] 10/03/2022 10/06/2022 oxycodone hcl (ir) 10 mg tab 84 14 Cameron [name] [id number] Other [facility]   [name] 09/21/2022 09/22/2022 oxycodone hcl (ir) 10 mg tab 84 14 [name] MD [id number] Other [facility]   [name] 09/07/2022 09/08/2022 oxycodone hcl (ir) 10 mg tab 84 14 [name] MD [id number] Other [facility]   [name] 08/24/2022 08/25/2022 oxycodone hcl (ir) 10 mg tab 84 14 Cameron [name] [id number] Other [facility]   [name] 08/11/2022 08/11/2022 oxycodone hcl (ir) 10 mg tab 84 14 Cameron [name] [id number] Other [facility]   [name] 07/24/2022 07/28/2022 oxycodone hcl (ir) 10 mg tab [address] Cameron [name] [id number] Other [facility]   [name] 07/10/2022 07/14/2022 oxycodone hcl (ir) 10 mg tab 84 14 [name] MD [id number] Other [facility]   [name] 06/29/2022 06/30/2022 oxycodone hcl (ir) 10 mg tab 84 14 [name] MD [id number] Other [facility]   [name] 06/16/2022 06/16/2022 oxycodone hcl (ir) 10 mg tab 84 14 Cameron [name] [id number] Other [facility]   [name] 06/01/2022 06/01/2022 oxycodone hcl (ir) 10 mg tab [address] Cameron [name] [id number] Other [facility]    [name] 05/02/2022 05/04/2022 oxycodone-acetaminophen 10-325 mg tab 168 [address] MD [id number] Other [facility]   [name] 04/05/2022 04/06/2022 oxycodone-acetaminophen 10-325 mg tab 168 28 [name], [name], (MD) [id number] Other [facility]   [name] 03/08/2022 03/09/2022 oxycodone-acetaminophen 10-325 mg tab 168 [address] MD [id number] Other [facility]   [name] 02/09/2022 02/09/2022 oxycodone-acetaminophen 10-325 mg tab 168 [address] MD [id number] Other [facility]   [name] 01/11/2022 01/14/2022 oxycodone hcl (ir) 10 mg tab 168 [address] MD [id number] Other [facility]   [name] 12/15/2021 12/17/2021 oxycodone hcl (ir) 10 mg tab 168 28 [name] MD [id number] Other [facility]   [name] 11/18/2021 11/19/2021 oxycodone hcl (ir) 10 mg tab 168 [address] MD [id number] Other [facility]

## 2022-11-15 ENCOUNTER — Other Ambulatory Visit: Payer: Self-pay

## 2022-11-15 MED ORDER — OXYCODONE HCL 10 MG PO TABS *I*
10.0000 mg | ORAL_TABLET | ORAL | 0 refills | Status: DC | PRN
Start: 2022-11-15 — End: 2022-11-28
  Filled 2022-11-15: qty 84, 14d supply, fill #0

## 2022-11-17 ENCOUNTER — Other Ambulatory Visit: Payer: Self-pay

## 2022-11-28 ENCOUNTER — Other Ambulatory Visit: Payer: Self-pay | Admitting: Primary Care

## 2022-11-28 NOTE — Telephone Encounter (Signed)
Confidential Drug Report  Search Terms: [name], Feb 02, 1996  Search Date: 11/28/2022 15:08:44 PM  Searching on behalf of: [id number] - [name]  The Drug Utilization Report below displays all of the controlled substance prescriptions, if any, that your patient has filled in the last twelve months. The information displayed on this report is compiled from pharmacy submissions to the Department, and accurately reflects the information as submitted by the pharmacies.  This report was requested by: [name]  Reference #: [id number]  Practitioner Count: 2  Pharmacy Count: 1  Current Opioid Prescriptions: 1  Current Benzodiazepine Prescriptions: 0  Current Stimulant Prescriptions: 0        Patient Demographic Information [facility])       [place] First Name Last Name Birth Date [place] Zip Code   [name] 1996-05-28 [address] [place] [address]        Search:  [name] Current Rx Drug Type Rx Written Rx Dispensed Drug Quantity Days Supply Prescriber Name Prescriber [facility] # Payment Method Dispenser   A Opal Sidles 11/15/2022 11/17/2022 oxycodone hcl (ir) 10 mg tab 84 14 [name] [id number] Other [facility] [place]   A Henrene Hawking 11/03/2022 11/03/2022 oxycodone hcl (ir) 10 mg tab 57 14 [name] [id number] Other [facility]   A Henrene Hawking 10/19/2022 10/20/2022 oxycodone hcl (ir) 10 mg tab 1 14 [name] [id number] Other [facility]   A Henrene Hawking 10/03/2022 10/06/2022 oxycodone hcl (ir) 10 mg tab 64 14 Helyn Numbers [id number] Other [facility]   A U Rollene Rotunda 09/21/2022 09/22/2022 oxycodone hcl (ir) 10 mg tab 21 14 [name] [id number] Other [facility]   A Henrene Hawking 09/07/2022 09/08/2022 oxycodone hcl (ir) 10 mg tab 60 14 [name] [id number] Other [facility]   A Henrene Hawking 08/24/2022 08/25/2022 oxycodone hcl (ir) 10 mg tab 60 14  Helyn Numbers [id number] Other [facility]   A U Rollene Rotunda 08/11/2022 08/11/2022 oxycodone hcl (ir) 10 mg tab 71 14 Cecille Aver [id number] Other [facility]   A U Rollene Rotunda 07/24/2022 07/28/2022 oxycodone hcl (ir) 10 mg tab [address] Anda Latina [id number] Other [facility]   A U Rollene Rotunda 07/10/2022 07/14/2022 oxycodone hcl (ir) 10 mg tab 74 14 [name] [id number] Other [facility]   A Henrene Hawking 06/29/2022 06/30/2022 oxycodone hcl (ir) 10 mg tab 12 14 [name] [id number] Other [facility]   A Henrene Hawking 06/16/2022 06/16/2022 oxycodone hcl (ir) 10 mg tab 66 14 Cecille Aver [id number] Other [facility]   [name] 06/01/2022 06/01/2022 oxycodone hcl (ir) 10 mg tab [address] Anda Latina [id number] Other [facility]   A Henrene Hawking 05/02/2022 05/04/2022 oxycodone-acetaminophen 10-325 mg tab 168 28 [name] [id number] Other [facility]   A Henrene Hawking 04/05/2022 04/06/2022 oxycodone-acetaminophen 10-325 mg tab 168 28 Hulan Fess, (MD) [id number] Other [facility]   [name] 03/08/2022 03/09/2022 oxycodone-acetaminophen 10-325 mg tab 168 [address] MD [id number] Other [facility]  I Windy Fast   [name] 02/09/2022 02/09/2022 oxycodone-acetaminophen 10-325 mg tab 168 28 [name] [id number] Other [facility]   A Henrene Hawking 01/11/2022 01/14/2022 oxycodone hcl (ir) 10 mg tab 168 28 [name] [id number] Other [facility]   A Henrene Hawking 12/15/2021 12/17/2021 oxycodone hcl (ir) 10 mg tab 168 28 [name] [id number] Other [facility]     Showing 1 to 27 of 21 entries     * - Details of Drug Type : O = Opioid, B = Benzodiazepine, S = Stimulant  * - Drugs marked with an asterisk are compound drugs. If the compound drug is made up of more than one controlled  substance, then each controlled substance will be a separate row in the table.

## 2022-11-30 ENCOUNTER — Other Ambulatory Visit: Payer: Self-pay

## 2022-11-30 MED ORDER — OXYCODONE HCL 10 MG PO TABS *I*
10.0000 mg | ORAL_TABLET | ORAL | 0 refills | Status: DC | PRN
Start: 2022-11-30 — End: 2022-12-12
  Filled 2022-11-30: qty 84, 14d supply, fill #0

## 2022-12-01 ENCOUNTER — Other Ambulatory Visit: Payer: Self-pay

## 2022-12-12 ENCOUNTER — Other Ambulatory Visit: Payer: Self-pay | Admitting: Primary Care

## 2022-12-13 NOTE — Telephone Encounter (Signed)
Patient Demographic Information [facility])   [facility] First Name Last Name Birth Date Gender Street Address [facility] Zip Code   Cameron [name] 1996-01-06 Male [address] [place] [address]   Search:  PDI My Rx Current Rx Drug Type Rx Written Rx Dispensed Drug Quantity Days Supply Prescriber Name Prescriber [facility] # Payment Method Dispenser   Cameron Cameron [name] 11/30/2022 12/01/2022 oxycodone hcl (ir) 10 mg tab 84 14 [name] MD [id number] Other [facility]   Cameron Cameron [name] 11/15/2022 11/17/2022 oxycodone hcl (ir) 10 mg tab 84 14 [name] MD [id number] Other [facility]   Cameron Cameron [name] 11/03/2022 11/03/2022 oxycodone hcl (ir) 10 mg tab 84 14 [name] MD [id number] Other [facility]   Cameron Cameron [name] 10/19/2022 10/20/2022 oxycodone hcl (ir) 10 mg tab 84 14 [name] MD [id number] Other [facility]   Cameron Cameron [name] 10/03/2022 10/06/2022 oxycodone hcl (ir) 10 mg tab 84 14 Cameron [name] [id number] Other [facility]   Cameron Cameron [name] 09/21/2022 09/22/2022 oxycodone hcl (ir) 10 mg tab 84 14 [name] MD [id number] Other [facility]   Cameron Cameron [name] 09/07/2022 09/08/2022 oxycodone hcl (ir) 10 mg tab 84 14 [name] MD [id number] Other [facility]   Cameron Cameron [name] 08/24/2022 08/25/2022 oxycodone hcl (ir) 10 mg tab 84 14 Cameron [name] [id number] Other [facility]   Cameron Cameron [name] 08/11/2022 08/11/2022 oxycodone hcl (ir) 10 mg tab 84 14 Cameron [name] [id number] Other [facility]   Cameron Cameron [name] 07/24/2022 07/28/2022 oxycodone hcl (ir) 10 mg tab [address] [name] [id number] Other [facility]   Cameron Cameron [name] 07/10/2022 07/14/2022 oxycodone hcl (ir) 10 mg tab 84 14 [name] MD [id number] Other [facility]   Cameron Cameron [name] 06/29/2022 06/30/2022 oxycodone hcl (ir) 10 mg tab 84 14 [name] MD [id number] Other [facility]   Cameron Cameron [name] 06/16/2022 06/16/2022 oxycodone hcl (ir) 10 mg tab 84 14 Cameron [name] [id number] Other [facility]   Cameron Cameron [name] 06/01/2022 06/01/2022 oxycodone hcl (ir) 10 mg tab [address] [name] [id number] Other [facility]   Cameron Cameron [name] 05/02/2022 05/04/2022 oxycodone-acetaminophen 10-325 mg tab 168 28 [name] MD [id number] Other [facility]   [name] 04/05/2022 04/06/2022 oxycodone-acetaminophen 10-325 mg tab 168 28 [name], [name], (MD) [id number] Other [facility]   Cameron Cameron [name] 03/08/2022 03/09/2022 oxycodone-acetaminophen 10-325 mg tab 168 [address] MD [id number] Other [facility]   Cameron Cameron [name] 02/09/2022 02/09/2022 oxycodone-acetaminophen 10-325 mg tab 168 [address] MD [id number] Other [facility]

## 2022-12-14 ENCOUNTER — Other Ambulatory Visit: Payer: Self-pay

## 2022-12-14 MED ORDER — OXYCODONE HCL 10 MG PO TABS *I*
10.0000 mg | ORAL_TABLET | ORAL | 0 refills | Status: DC | PRN
Start: 2022-12-14 — End: 2022-12-26
  Filled 2022-12-14: qty 84, 14d supply, fill #0

## 2022-12-15 ENCOUNTER — Other Ambulatory Visit: Payer: Self-pay

## 2022-12-26 ENCOUNTER — Other Ambulatory Visit: Payer: Self-pay

## 2022-12-26 ENCOUNTER — Other Ambulatory Visit: Payer: Self-pay | Admitting: Primary Care

## 2022-12-26 MED ORDER — OXYCODONE HCL 10 MG PO TABS *I*
10.0000 mg | ORAL_TABLET | ORAL | 0 refills | Status: DC | PRN
Start: 2022-12-26 — End: 2023-01-09
  Filled 2022-12-26: qty 84, 14d supply, fill #0

## 2022-12-26 NOTE — Telephone Encounter (Signed)
onfidential Drug Report  Search Terms: Cameron [name], 1996-01-07  Search Date: 12/26/2022 16:21:07 PM  Searching on behalf of: [id number] - [name]  Cameron Drug Utilization Report below displays all of Cameron controlled substance prescriptions, if any, that your patient has filled in Cameron last twelve months. Cameron information displayed on this report is compiled from pharmacy submissions to Cameron Department, and accurately reflects Cameron information as submitted by Cameron pharmacies.  This report was requested by: Gerrie Nordmann  Reference #: [id number]  Practitioner Count: 2  Pharmacy Count: 1  Current Opioid Prescriptions: 1  Current Benzodiazepine Prescriptions: 0  Current Stimulant Prescriptions: 0        Patient Demographic Information [facility])       PDI First Name Last Name Birth Date Gender Street Address [facility] Zip Code   [name] 1995/10/22 Male [address] [place] [address]        Search:  PDI My Rx Current Rx Drug Type Rx Written Rx Dispensed Drug Quantity Days Supply Prescriber Name Prescriber [facility] # Payment Method Dispenser   A [name] 12/14/2022 12/15/2022 oxycodone hcl (ir) 10 mg tab 84 14 [name] MD [id number] Other Cameron [name]   A [name] 11/30/2022 12/01/2022 oxycodone hcl (ir) 10 mg tab 84 14 [name] MD [id number] Other Cameron [name]   A [name] 11/15/2022 11/17/2022 oxycodone hcl (ir) 10 mg tab 84 14 [name] MD [id number] Other Cameron [name]   A [name] 11/03/2022 11/03/2022 oxycodone hcl (ir) 10 mg tab 84 14 [name] MD [id number] Other Cameron [name]   A [name] 10/19/2022 10/20/2022 oxycodone hcl (ir) 10 mg tab 84 14 [name] MD [id number] Other Cameron [name]   A [name] 10/03/2022 10/06/2022 oxycodone hcl (ir) 10 mg tab 84 14 [name] [id number] Other Cameron [name]   A U [name] 09/21/2022 09/22/2022 oxycodone hcl (ir) 10 mg tab 84 14  [name] MD [id number] Other Cameron [name]   A [name] 09/07/2022 09/08/2022 oxycodone hcl (ir) 10 mg tab 84 14 [name] MD [id number] Other Cameron [name]   A [name] 08/24/2022 08/25/2022 oxycodone hcl (ir) 10 mg tab 84 14 [name] [id number] Other Cameron [name]   A U [name] 08/11/2022 08/11/2022 oxycodone hcl (ir) 10 mg tab 84 14 [name] [id number] Other Cameron [name]   A U [name] 07/24/2022 07/28/2022 oxycodone hcl (ir) 10 mg tab [address] [name] [id number] Other Cameron [name]   A U [name] 07/10/2022 07/14/2022 oxycodone hcl (ir) 10 mg tab 84 14 [name] MD [id number] Other Cameron [name]   A [name] 06/29/2022 06/30/2022 oxycodone hcl (ir) 10 mg tab 84 14 [name] MD [id number] Other Cameron [name]   A [name] 06/16/2022 06/16/2022 oxycodone hcl (ir) 10 mg tab 84 14 [name] [id number] Other Cameron [name]   [name] 06/01/2022 06/01/2022 oxycodone hcl (ir) 10 mg tab [address] [name] [id number] Other Cameron [name]   A [name] 05/02/2022 05/04/2022 oxycodone-acetaminophen 10-325 mg tab 168 28 Marchia Meiers M  MD [id number] Other Cameron [name]   [name] 04/05/2022 04/06/2022 oxycodone-acetaminophen 10-325 mg tab 168 28 [name], [name], (MD) [id number] Other Cameron [name]   [name] 03/08/2022 03/09/2022 oxycodone-acetaminophen 10-325 mg tab 168 28 [name] MD [id number] Other Cameron [name]   A [name] 02/09/2022 02/09/2022 oxycodone-acetaminophen 10-325 mg tab 168 28 [name] MD [id number] Other Cameron [name]   A [name] 01/11/2022 01/14/2022 oxycodone hcl (ir) 10 mg tab 168 28 [name] MD [id number] Other Cameron [name]     Showing 1 to 20 of 20 entries     * - Details of Drug Type : O = Opioid, B = Benzodiazepine,  S = Stimulant  * - Drugs marked with an asterisk are compound drugs. If Cameron compound drug is made up of more than one controlled substance, then each controlled substance will be a separate row in Cameron table.

## 2022-12-29 ENCOUNTER — Other Ambulatory Visit: Payer: Self-pay

## 2023-01-09 ENCOUNTER — Other Ambulatory Visit: Payer: Self-pay | Admitting: Primary Care

## 2023-01-09 NOTE — Telephone Encounter (Signed)
Patient Demographic Information [facility])       [facility] First Name Last Name Birth Date Gender Street Address [facility] Zip Code   [name] 04-Mar-1996 Male [address] Cameron [name] [place] [address]   Search:  PDI My Rx Current Rx Drug Type Rx Written Rx Dispensed Drug Quantity Days Supply Prescriber Name Prescriber [facility] # Payment Method Dispenser   [name] 12/26/2022 12/29/2022 oxycodone hcl (ir) 10 mg tab 84 14 Cameron Cellar MD [id number] Other [facility]   [name] 12/14/2022 12/15/2022 oxycodone hcl (ir) 10 mg tab 84 14 Cameron Cellar MD [id number] Other [facility]   [name] 11/30/2022 12/01/2022 oxycodone hcl (ir) 10 mg tab 84 14 Cameron Cellar MD [id number] Other [facility]   [name] 11/15/2022 11/17/2022 oxycodone hcl (ir) 10 mg tab 84 14 Cameron Cellar MD [id number] Other [facility]   [name] 11/03/2022 11/03/2022 oxycodone hcl (ir) 10 mg tab 84 14 Cameron Cellar MD [id number] Other [facility]   [name] 10/19/2022 10/20/2022 oxycodone hcl (ir) 10 mg tab 84 14 Cameron Cellar MD [id number] Other [facility]   [name] 10/03/2022 10/06/2022 oxycodone hcl (ir) 10 mg tab 84 14 [name] [id number] Other [facility]   A U [name] 09/21/2022 09/22/2022 oxycodone hcl (ir) 10 mg tab 84 14 Cameron Cellar MD [id number] Other [facility]   [name] 09/07/2022 09/08/2022 oxycodone hcl (ir) 10 mg tab 84 14 Cameron Cellar MD [id number] Other [facility]   [name] 08/24/2022 08/25/2022 oxycodone hcl (ir) 10 mg tab 84 14 [name] [id number] Other [facility]   A U [name] 08/11/2022 08/11/2022 oxycodone hcl (ir) 10 mg tab 84 14 [name] [id number] Other [facility]   A U [name] 07/24/2022 07/28/2022 oxycodone hcl (ir) 10 mg tab [address] [name] [id number] Other [facility]   A U [name] 07/10/2022 07/14/2022 oxycodone hcl (ir) 10 mg tab 84 14 Cameron Cellar MD [id number] Other [facility]   [name] 06/29/2022 06/30/2022 oxycodone hcl (ir) 10 mg tab 84 14 Cameron Cellar MD [id number] Other [facility]   [name] 06/16/2022 06/16/2022 oxycodone hcl (ir) 10 mg tab 84 14 [name] [id number] Other [facility]   [name] 06/01/2022 06/01/2022 oxycodone hcl (ir) 10 mg tab [address] [name] [id number] Other [facility]   [name] 05/02/2022 05/04/2022 oxycodone-acetaminophen 10-325 mg tab 168 [address] MD [id number] Other [facility]

## 2023-01-10 ENCOUNTER — Other Ambulatory Visit: Payer: Self-pay

## 2023-01-10 MED ORDER — OXYCODONE HCL 10 MG PO TABS *I*
10.0000 mg | ORAL_TABLET | ORAL | 0 refills | Status: DC | PRN
Start: 2023-01-10 — End: 2023-01-23
  Filled 2023-01-10: qty 84, 14d supply, fill #0

## 2023-01-12 ENCOUNTER — Other Ambulatory Visit: Payer: Self-pay

## 2023-01-22 ENCOUNTER — Other Ambulatory Visit: Payer: Self-pay

## 2023-01-22 ENCOUNTER — Encounter: Payer: Self-pay | Admitting: Oncology

## 2023-01-22 ENCOUNTER — Ambulatory Visit: Payer: Self-pay | Admitting: Student in an Organized Health Care Education/Training Program

## 2023-01-22 ENCOUNTER — Other Ambulatory Visit
Admission: RE | Admit: 2023-01-22 | Discharge: 2023-01-22 | Disposition: A | Discharge status: Home / Self Care | Payer: Self-pay | Source: Ambulatory Visit | Attending: Student in an Organized Health Care Education/Training Program | Admitting: Student in an Organized Health Care Education/Training Program

## 2023-01-22 ENCOUNTER — Encounter: Payer: Self-pay | Admitting: Student in an Organized Health Care Education/Training Program

## 2023-01-22 VITALS — BP 129/76 | HR 97 | Temp 97.5°F | Wt 194.1 lb

## 2023-01-22 DIAGNOSIS — D564 Hereditary persistence of fetal hemoglobin [HPFH]: Secondary | ICD-10-CM | POA: Insufficient documentation

## 2023-01-22 DIAGNOSIS — R0602 Shortness of breath: Secondary | ICD-10-CM | POA: Insufficient documentation

## 2023-01-22 DIAGNOSIS — D571 Sickle-cell disease without crisis: Secondary | ICD-10-CM

## 2023-01-22 DIAGNOSIS — R61 Generalized hyperhidrosis: Secondary | ICD-10-CM | POA: Insufficient documentation

## 2023-01-22 DIAGNOSIS — R5383 Other fatigue: Secondary | ICD-10-CM | POA: Insufficient documentation

## 2023-01-22 DIAGNOSIS — D5701 Hb-SS disease with acute chest syndrome: Secondary | ICD-10-CM | POA: Insufficient documentation

## 2023-01-22 LAB — CBC AND DIFFERENTIAL
Baso # K/uL: 0.1 10*3/uL (ref 0.0–0.2)
Eos # K/uL: 0.6 10*3/uL — ABNORMAL HIGH (ref 0.0–0.5)
Hematocrit: 28 % — ABNORMAL LOW (ref 37–52)
Hemoglobin: 8.4 g/dL — ABNORMAL LOW (ref 12.0–17.0)
IMM Granulocytes #: 0 10*3/uL (ref 0.0–0.0)
IMM Granulocytes: 0.4 %
Lymph # K/uL: 1.9 10*3/uL (ref 1.0–5.0)
MCV: 63 fL — ABNORMAL LOW (ref 75–100)
Mono # K/uL: 0.8 10*3/uL (ref 0.1–1.0)
Neut # K/uL: 7.3 10*3/uL — ABNORMAL HIGH (ref 1.5–6.5)
Nucl RBC # K/uL: 0 10*3/uL (ref 0.0–0.0)
Nucl RBC %: 0 /100 WBC (ref 0.0–0.2)
Platelets: 439 10*3/uL (ref 150–450)
RBC: 4.4 MIL/uL (ref 4.0–6.0)
RDW: 21.3 % — ABNORMAL HIGH (ref 0.0–15.0)
Seg Neut %: 67.9 %
WBC: 10.7 10*3/uL (ref 3.5–11.0)

## 2023-01-22 LAB — RBC MORPHOLOGY

## 2023-01-22 LAB — TSH: TSH: 1.82 u[IU]/mL (ref 0.27–4.20)

## 2023-01-22 LAB — TIBC
Iron: 35 ug/dL — ABNORMAL LOW (ref 45–170)
TIBC: 483 ug/dL — ABNORMAL HIGH (ref 250–450)
Transferrin Saturation: 7 % — ABNORMAL LOW (ref 20–55)

## 2023-01-22 LAB — RETICULOCYTES
Retic %: 2.4 % — ABNORMAL HIGH (ref 0.7–2.3)
Retic Abs: 105.1 10*3/uL (ref 33.8–124.0)

## 2023-01-22 LAB — TRANSFERRIN: Transferrin: 358 mg/dL (ref 200–360)

## 2023-01-22 LAB — VITAMIN D: 25-OH Vit Total: 10 ng/mL — ABNORMAL LOW (ref 30–60)

## 2023-01-22 LAB — FERRITIN: Ferritin: 23 ng/mL (ref 20–250)

## 2023-01-22 LAB — FOLATE: Folate: 5.5 ng/mL (ref >=4.6)

## 2023-01-23 ENCOUNTER — Other Ambulatory Visit: Payer: Self-pay | Admitting: Primary Care

## 2023-01-24 ENCOUNTER — Other Ambulatory Visit: Payer: Self-pay

## 2023-01-24 MED ORDER — OXYCODONE HCL 10 MG PO TABS *I*
10.0000 mg | ORAL_TABLET | ORAL | 0 refills | Status: DC | PRN
Start: 2023-01-24 — End: 2023-02-06
  Filled 2023-01-24: qty 84, 14d supply, fill #0

## 2023-01-24 NOTE — Telephone Encounter (Signed)
Patient Demographic Information [facility])  [facility] First Name Last Name Birth Date Gender Street Address [facility] Zip Code   A Demetrius Brands 04-18-1996 Male [address] [place] [address]     Search:  PDI My Rx Current Rx Drug Type Rx Written Rx Dispensed Drug Quantity Days Supply Prescriber Name Prescriber [facility] # Payment Method Dispenser   A Johny Shears 01/10/2023 01/12/2023 oxycodone hcl (ir) 10 mg tab 84 14 [name] MD [id number] Other [facility]   [name] 12/26/2022 12/29/2022 oxycodone hcl (ir) 10 mg tab 84 14 [name] MD [id number] Other [facility]   [name] 12/14/2022 12/15/2022 oxycodone hcl (ir) 10 mg tab 84 14 [name] MD [id number] Other [facility]   [name] 11/30/2022 12/01/2022 oxycodone hcl (ir) 10 mg tab 84 14 [name] MD [id number] Other [facility]   [name] 11/15/2022 11/17/2022 oxycodone hcl (ir) 10 mg tab 84 14 [name] MD [id number] Other [facility]   [name] 11/03/2022 11/03/2022 oxycodone hcl (ir) 10 mg tab 84 14 [name] MD [id number] Other [facility]   [name] 10/19/2022 10/20/2022 oxycodone hcl (ir) 10 mg tab 84 14 [name] MD [id number] Other [facility]   [name] 10/03/2022 10/06/2022 oxycodone hcl (ir) 10 mg tab 84 14 [name] [id number] Other [facility]   [name] 09/21/2022 09/22/2022 oxycodone hcl (ir) 10 mg tab 84 14 [name] MD [id number] Other [facility]   [name] 09/07/2022 09/08/2022 oxycodone hcl (ir) 10 mg tab 84 14 [name] MD [id number] Other [facility]   [name] 08/24/2022 08/25/2022 oxycodone hcl (ir) 10 mg tab 84 14 [name] [id number] Other [facility]   [name] 08/11/2022 08/11/2022 oxycodone hcl (ir) 10 mg tab 84 14 [name] [id number] Other [facility]   [name] 07/24/2022 07/28/2022 oxycodone hcl (ir) 10 mg tab [address] [name] [id number] Other [facility]   [name] 07/10/2022 07/14/2022 oxycodone hcl (ir) 10 mg tab 84 14 [name] MD [id number] Other [facility]   [name] 06/29/2022 06/30/2022 oxycodone hcl (ir) 10 mg tab 84 14 [name] MD [id number] Other [facility]   [name] 06/16/2022 06/16/2022 oxycodone hcl (ir) 10 mg tab 84 14 [name] [id number] Other [facility]   [name] 06/01/2022 06/01/2022 oxycodone hcl (ir) 10 mg tab [address] [name] [id number] Other [facility]   [name] 05/02/2022 05/04/2022 oxycodone-acetaminophen 10-325 mg tab 168 28 [name] MD [id number] Other [facility]   [name] 04/05/2022 04/06/2022 oxycodone-acetaminophen 10-325 mg tab 168 28 [name], [name], (MD) [id number] Other [facility]   [name] 03/08/2022 03/09/2022 oxycodone-acetaminophen 10-325 mg tab 168 28 [name] MD [id number] Other [facility]   [name] 02/09/2022 02/09/2022 oxycodone-acetaminophen 10-325 mg tab 168  [address] MD [id number] Other [facility]

## 2023-01-26 ENCOUNTER — Other Ambulatory Visit: Payer: Self-pay

## 2023-02-06 ENCOUNTER — Encounter: Payer: Self-pay | Admitting: Primary Care

## 2023-02-06 ENCOUNTER — Telehealth: Payer: Self-pay | Admitting: Primary Care

## 2023-02-06 ENCOUNTER — Other Ambulatory Visit: Payer: Self-pay | Admitting: Student in an Organized Health Care Education/Training Program

## 2023-02-06 NOTE — Telephone Encounter (Signed)
Writer spoke with patient regarding incoming MyChart message received.   Noted new onset bilateral eye, yellow coloring since Saturday, 02/02/2023.  Denies abdominal pain.   Denies change to urination and stools.   Denies eye pain.   Currently out of town or work until the end of the month and unable to return for an in-person appointment.   Writer requested picture of eye yellowing to forward to provider for review.   SCD Coordinator notified of phone call and concern.     Gary Fleet, RN

## 2023-02-06 NOTE — Telephone Encounter (Signed)
Patient Demographic Information [facility])       [facility] First Name Last Name Birth Date Gender Street Address [facility] Zip Code   Cameron [name] 1996-02-03 Male [address] [place] [address]     Search:  PDI My Rx Current Rx Drug Type Rx Written Rx Dispensed Drug Quantity Days Supply Prescriber Name Prescriber [facility] # Payment Method Dispenser   Cameron Johny Shears 01/24/2023 01/26/2023 oxycodone hcl (ir) 10 mg tab 84 14 [name] [id number] Other [facility]   Cameron N [name] 01/10/2023 01/12/2023 oxycodone hcl (ir) 10 mg tab 84 14 [name] MD [id number] Other [facility]   Cameron N [name] 12/26/2022 12/29/2022 oxycodone hcl (ir) 10 mg tab 84 14 [name] MD [id number] Other [facility]   Cameron Almond Lint 12/14/2022 12/15/2022 oxycodone hcl (ir) 10 mg tab 84 14 [name] MD [id number] Other [facility]   Cameron N [name] 11/30/2022 12/01/2022 oxycodone hcl (ir) 10 mg tab 84 14 [name] MD [id number] Other [facility]   Cameron Almond Lint 11/15/2022 11/17/2022 oxycodone hcl (ir) 10 mg tab 84 14 [name] MD [id number] Other [facility]   Cameron Almond Lint 11/03/2022 11/03/2022 oxycodone hcl (ir) 10 mg tab 84 14 [name] MD [id number] Other [facility]   Cameron Almond Lint 10/19/2022 10/20/2022 oxycodone hcl (ir) 10 mg tab 84 14 [name] MD [id number] Other [facility]   Cameron N [name] 10/03/2022 10/06/2022 oxycodone hcl (ir) 10 mg tab 84 14 [name] [id number] Other [facility]   Cameron N [name] 09/21/2022 09/22/2022 oxycodone hcl (ir) 10 mg tab 84 14 [name] MD [id number] Other [facility]   Cameron N [name] 09/07/2022 09/08/2022 oxycodone hcl (ir) 10 mg tab 84 14 [name] MD [id number] Other [facility]   Cameron Almond Lint 08/24/2022 08/25/2022 oxycodone hcl (ir) 10 mg tab 84 14 [name] [id number] Other [facility]   Cameron N [name] 08/11/2022 08/11/2022 oxycodone hcl (ir) 10 mg tab 84 14 [name] [id number] Other [facility]   Cameron N [name] 07/24/2022 07/28/2022 oxycodone hcl (ir) 10 mg tab [address] [name] [id number] Other [facility]   Cameron N [name] 07/10/2022 07/14/2022 oxycodone hcl (ir) 10 mg tab 84 14 [name] MD [id number] Other [facility]   Cameron N [name] 06/29/2022 06/30/2022 oxycodone hcl (ir) 10 mg tab 84 14 [name] MD [id number] [facility]   Cameron N [name] 06/16/2022 06/16/2022 oxycodone hcl (ir) 10 mg tab 84 14 [name] [id number] Other [facility]   Cameron N [name] 06/01/2022 06/01/2022 oxycodone hcl (ir) 10 mg tab [address] [name] [id number] Other [facility]   Cameron Almond Lint 05/02/2022 05/04/2022 oxycodone-acetaminophen 10-325 mg tab 168 [address] MD [id number] Other [facility]

## 2023-02-06 NOTE — Telephone Encounter (Signed)
This patient attachment is clinically relevant.  Please keep in the patient's chart.    []  Document  [x]  Photo    Brief attachment description: Eye Yellowing  (Ex. L forearm rash, WC papers)    Thank you,  Gary Fleet, RN

## 2023-02-06 NOTE — Telephone Encounter (Signed)
Pictures reviewed.   Would ensure none of the following (some you have covered):  Recent blood transfusion (past month)  Abdominal pain (particularly RUQ)  Light headedness  Chest pain  Dyspnea    If this is the case, or they develop in the future, he should likely be evaluated in an ED.    In the absence of these things, would advise him a crisis may come (or might not). He should track his urine (it may be dark/tea colored) and drink plenty of fluids (2.5-3L/day) to ensure clear urine.     [name], MD

## 2023-02-06 NOTE — Telephone Encounter (Signed)
[facility] TELEPHONE INTAKE    Reason for call: 'Patient called and said his eyes are yellow.... he also sent a mychart saying "im sending this message regarding my eyes, I've been noticing yellow in my eyes & I know that can be a sign of jaundice. I'm not feeling any symptoms but I wanna bring it to you guys attention just in case"     Name of caller: Sayeed Gersten  Relationship to patient: Self  Organization:   Phone:  [phone]  Fax:

## 2023-02-07 ENCOUNTER — Other Ambulatory Visit: Payer: Self-pay

## 2023-02-07 MED ORDER — OXYCODONE HCL 10 MG PO TABS *I*
10.0000 mg | ORAL_TABLET | ORAL | 0 refills | Status: DC | PRN
Start: 2023-02-07 — End: 2023-02-20
  Filled 2023-02-07: qty 84, 14d supply, fill #0

## 2023-02-09 ENCOUNTER — Other Ambulatory Visit: Payer: Self-pay

## 2023-02-14 ENCOUNTER — Telehealth: Payer: Self-pay | Admitting: Student in an Organized Health Care Education/Training Program

## 2023-02-14 NOTE — Telephone Encounter (Signed)
Patient called to report recent development of yellow eyes, right upper quadrant pain, subjective chills.  He is currently out of town in [place], but noticed his eyes were getting yellow about a week ago or more.  Since then he has developed more frequent episodes of right upper quadrant pain along with increasing severity.  He has not measured his temperature and has not felt specifically feverish, but does state he sometimes feels warm.  He denies chest pain, change in bowels, joint swelling, emesis.  He is having some lower back pain that is chronic.    We discussed the concerning symptoms of his pain and scleral icterus.  He also revealed he has been having a lot of itching that has been worsening over time.  I advised him to consider going to the ED tonight for evaluation, with concerns that his symptoms are related to biliary pathology.  We discussed if he developed a fever he should be evaluated in the ED.  If he did not go for evaluation tonight he should be evaluated in an urgent care in the morning.    Patient agreed to go to the ED this evening.  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     [name], DO

## 2023-02-15 IMAGING — CT CT ABDOMEN PELVIS WITH CONTRAST
2 of 3 series · 17 of 46 positions shown, 19 images · IV contrast (agent unspecified)
Comparison: None

FINAL REPORT:
EXAM: CT ABDOMEN PELVIS WITH CONTRAST
INDICATION: Right upper pain with elevated bilirubin

[Series 2: abd/pel · axial · 0.70mm/px · z∈[-498,-40]mm · 14 of 211 slices shown, 16 images]
[im 14/211  soft-tissue]
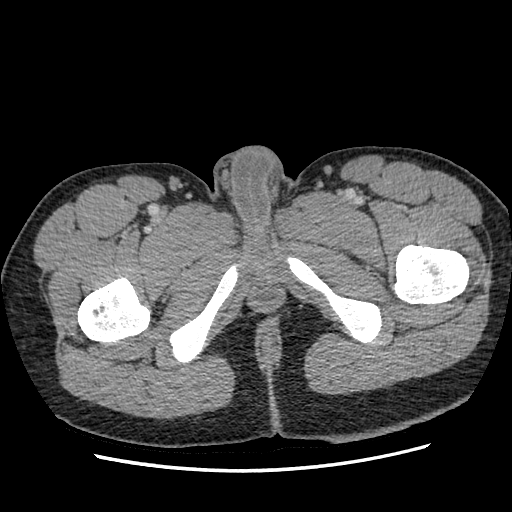
[im 14/211  bone]
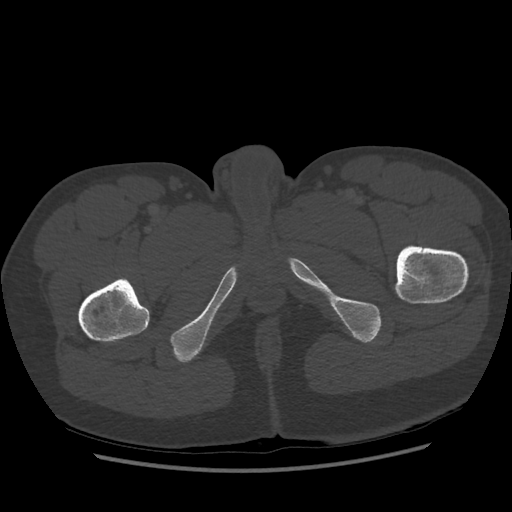
[im 28/211  soft-tissue]
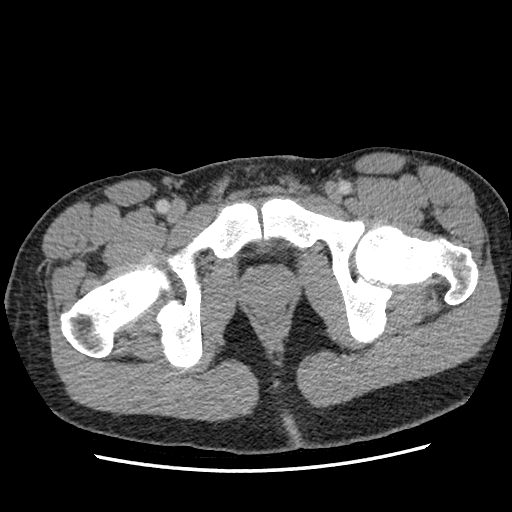
[im 41/211  soft-tissue]
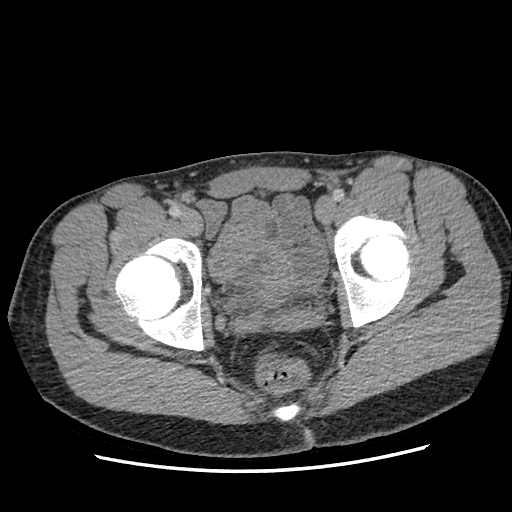
[im 55/211  soft-tissue]
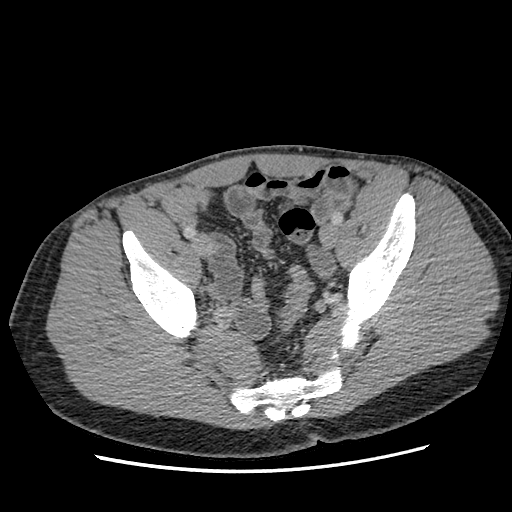
[im 68/211  soft-tissue]
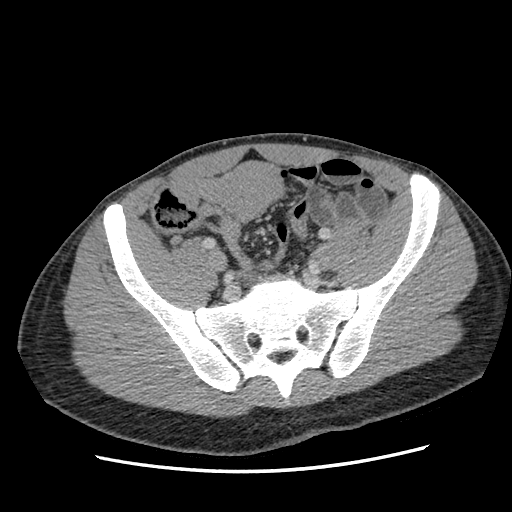
[im 82/211  soft-tissue]
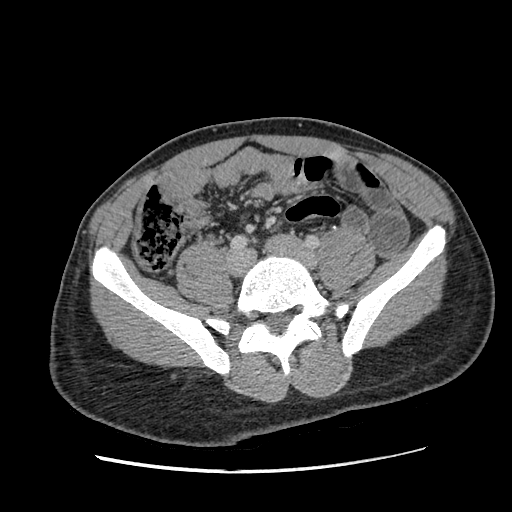
[im 95/211  soft-tissue]
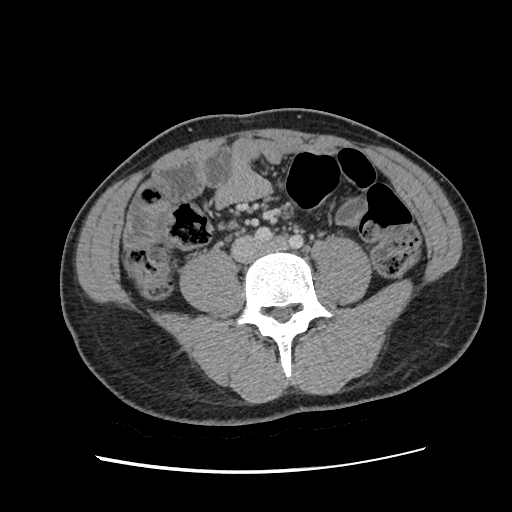
[im 116/211  soft-tissue]
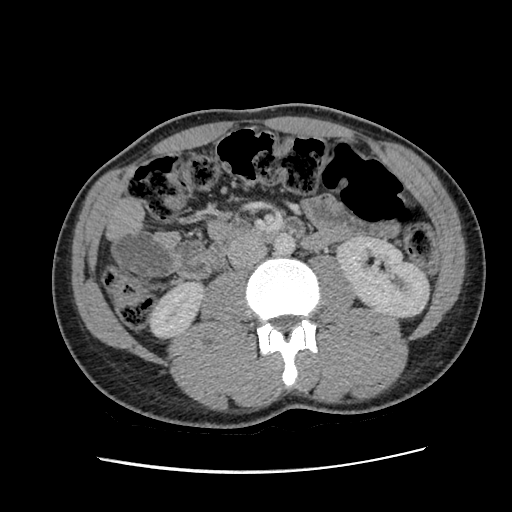
[im 129/211  soft-tissue]
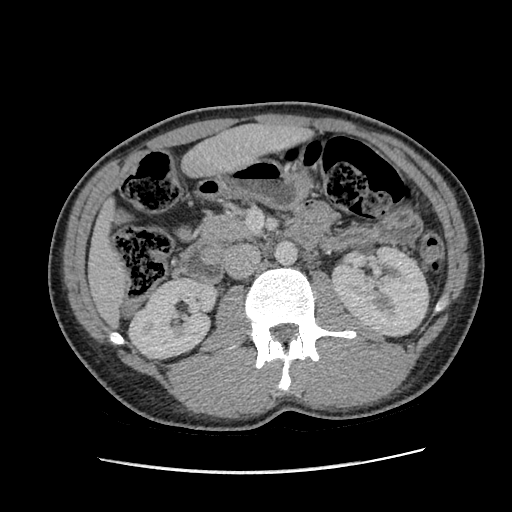
[im 129/211  bone]
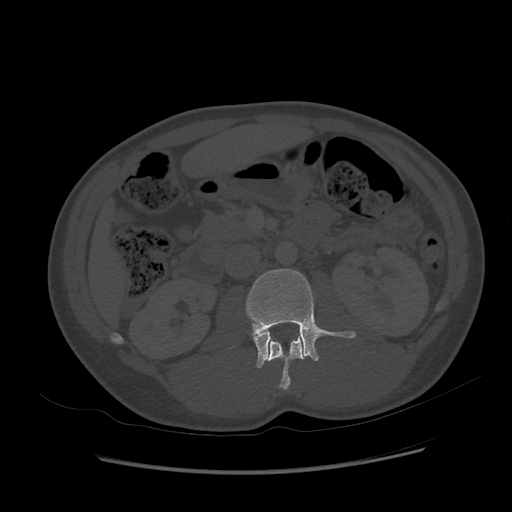
[im 143/211  soft-tissue]
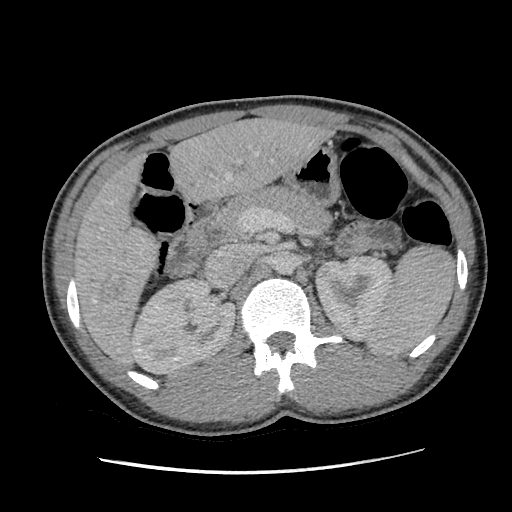
[im 156/211  soft-tissue]
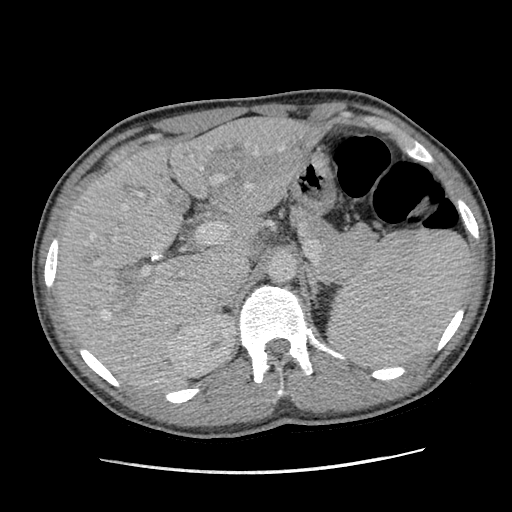
[im 170/211  soft-tissue]
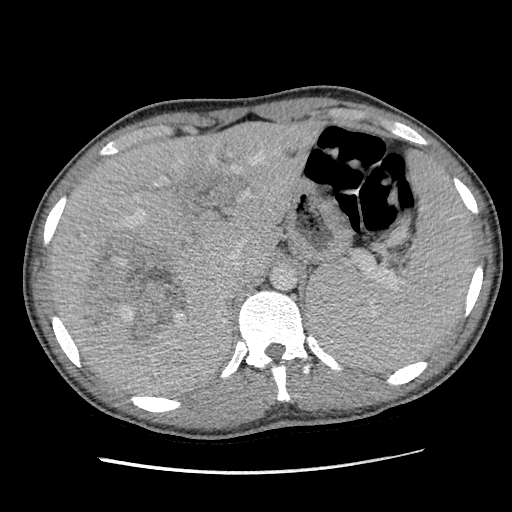
[im 183/211  soft-tissue]
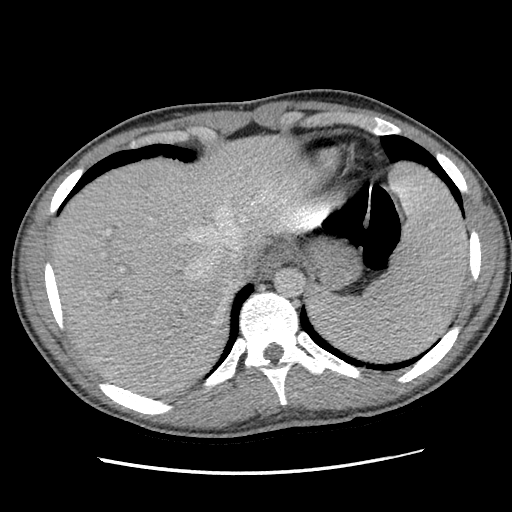
[im 197/211  soft-tissue]
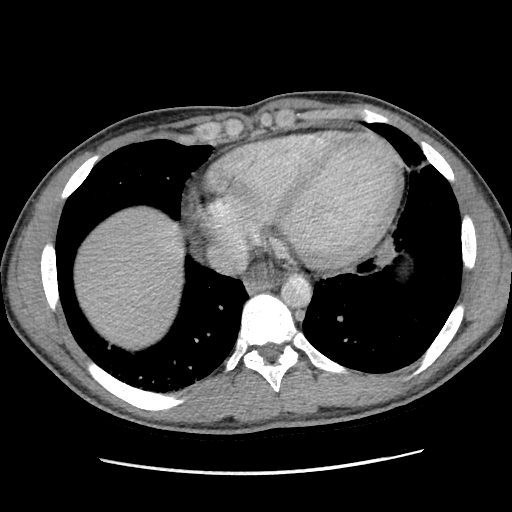

[Series 602: sag standard 2x2 · sagittal · 1.03mm/px · 3 of 181 slices shown]
[im 61/181  soft-tissue]
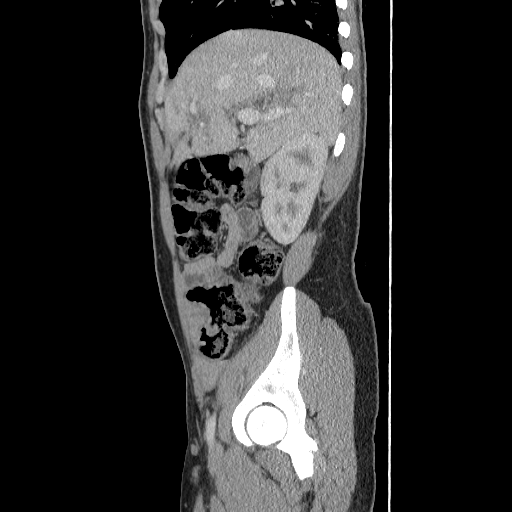
[im 81/181  soft-tissue]
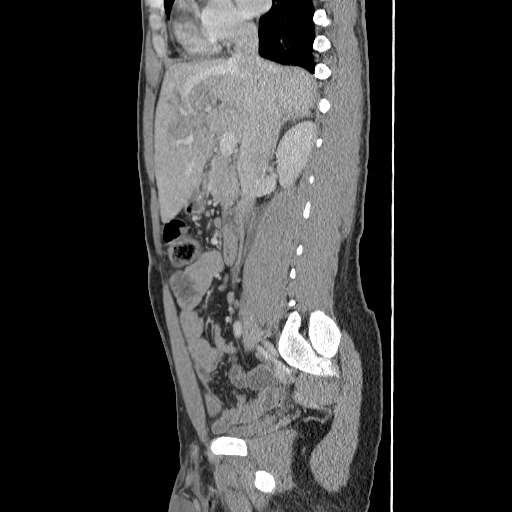
[im 101/181  soft-tissue]
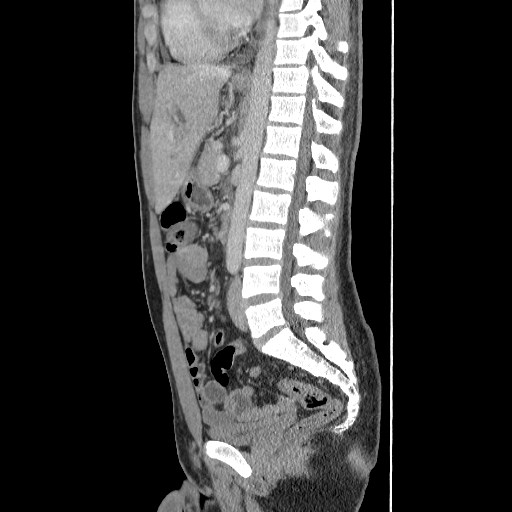

[17 of 46 positions shown; findings below may reference images not displayed]

FINDINGS: CT of the abdomen and pelvis performed with multiplanar reformations. IV contrast administered
VISUALIZED LOWER CHEST: Subsegmental atelectatic changes in the lungs.
LIVER: There is heterogeneously hypoechoic appearance of the periportal portions of the liver. There is peripheral biliary dilatation as well.
GALLBLADDER: Absent
BILIARY: See above
PANCREAS: Within normal limits.
SPLEEN: Enlarged measuring 15.6 cm.
ADRENAL GLANDS: No adrenal nodules.
KIDNEYS AND URETERS: No hydronephrosis. No urinary calculi.
URINARY BLADDER: Underdistended and not well evaluated.
REPRODUCTIVE: Grossly within normal limits.
PERITONEUM: No pathologic free intraperitoneal fluid.
BOWEL: No wall thickening or obstruction. Appendix is not discretely visualized.
VASCULATURE: Normal caliber aorta.
LYMPH NODES: No lymphadenopathy.
ABDOMINAL WALL: No bowel containing hernia.
MUSCULOSKELETAL: No suspicious osseous lesions.
IMPRESSION: *  Marked heterogeneously hypoechoic appearance of the periportal portions of the liver with peripheral intrahepatic biliary dilatation. Findings are concerning for nonspecific cholangitis and/or other biliary pathology. MRI/MRCP with and without contrast is required.

## 2023-02-15 IMAGING — MR MRI ABDOMEN WITHOUT CONTRAST
6 of 8 series · 41 of 48 positions shown · non-contrast
Comparison: CT abdomen and pelvis from the same date.

FINAL REPORT:
MRI ABDOMEN WITHOUT CONTRAST
INDICATION: Epigastric pain.
TECHNIQUE: Multiplanar multisequence MR imaging of the abdomen was performed without contrast per MRCP protocol. MIP/3D reconstructions were submitted for evaluation.

[Series 1001: survey · axial · 8.0mm · 0.84mm/px · z∈[+10,+217]mm · 3 of 23 slices shown]
[im 1/23]
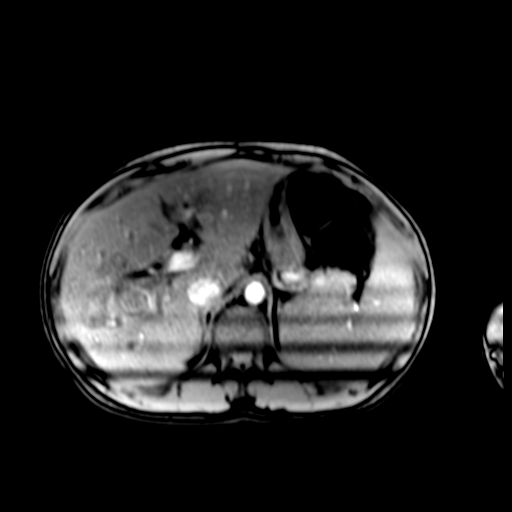
[im 12/23]
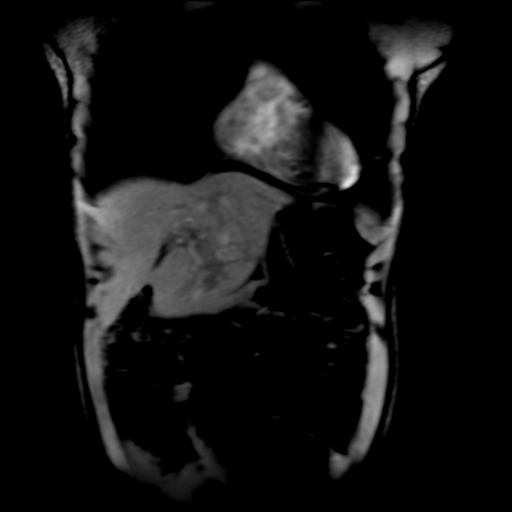
[im 23/23]
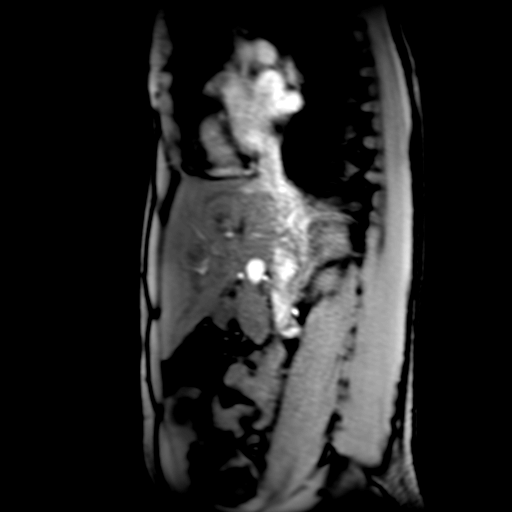

[Series 3001: T2 · coronal · 6.0mm · 1.56mm/px · 4 of 32 slices shown (1 of 2)]
[im 1/32]
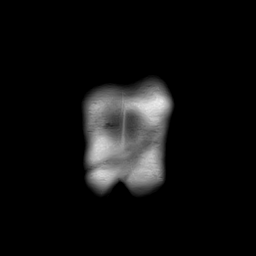
[im 11/32]
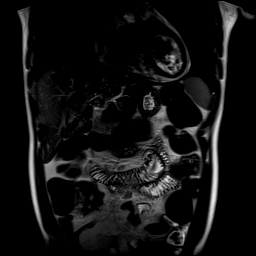
[im 21/32]
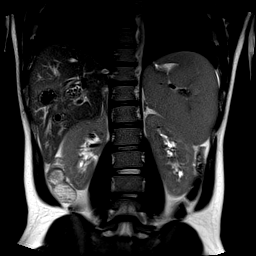
[im 32/32]
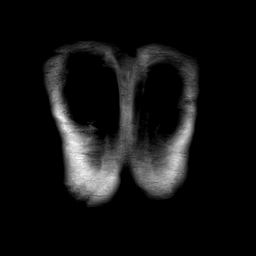

[Series 4001: ax btfe · axial · 6.0mm · 1.19mm/px · z∈[-108,+126]mm · 5 of 40 slices shown]
[im 1/40]
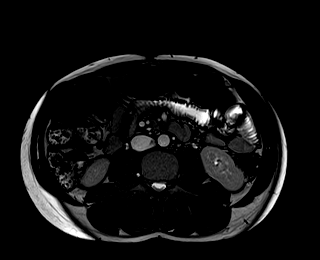
[im 10/40]
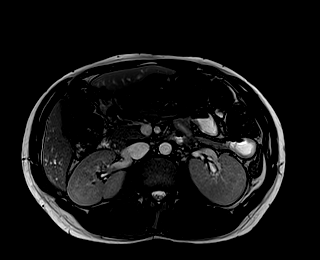
[im 20/40]
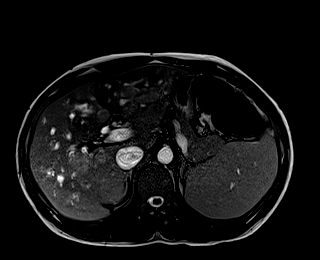
[im 30/40]
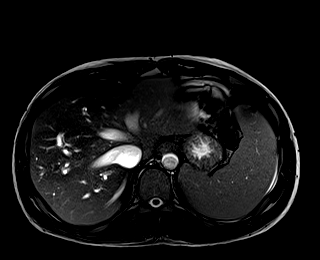
[im 40/40]
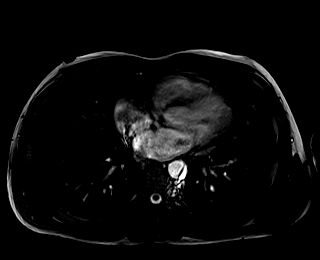

[Series 5001: T1 dynamic · axial · 3.0mm · 1.19mm/px · z∈[-109,+128]mm · 21 of 160 slices shown]
[im 1/160]
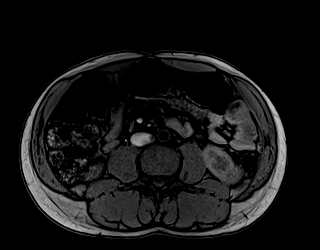
[im 8/160]
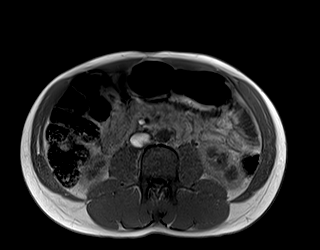
[im 16/160]
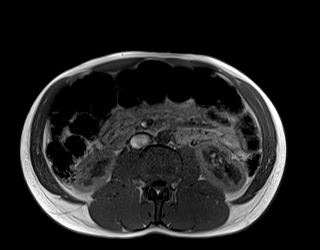
[im 24/160]
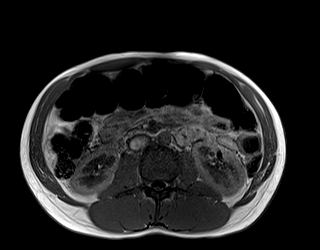
[im 32/160]
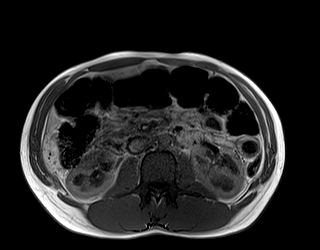
[im 40/160]
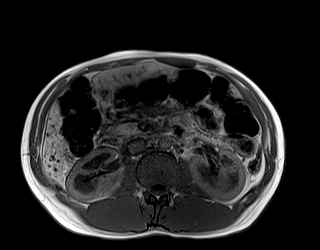
[im 48/160]
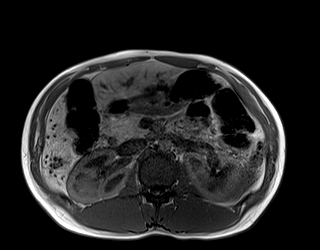
[im 56/160]
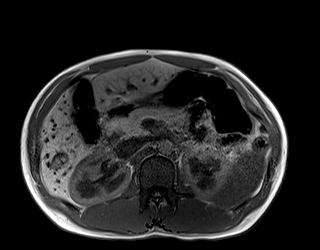
[im 64/160]
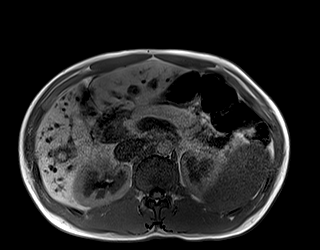
[im 72/160]
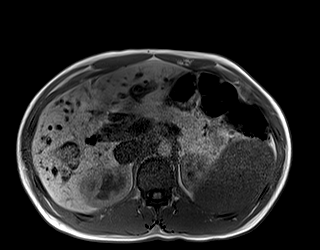
[im 80/160]
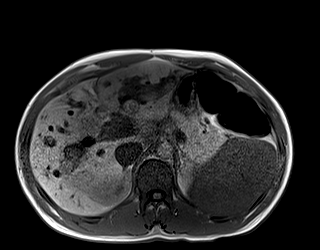
[im 88/160]
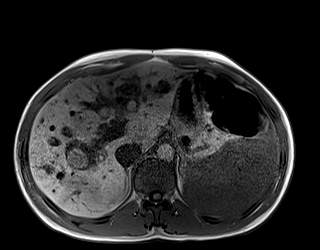
[im 96/160]
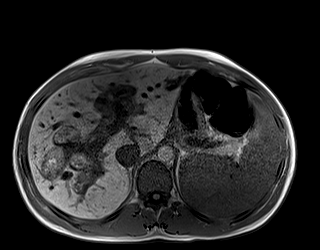
[im 104/160]
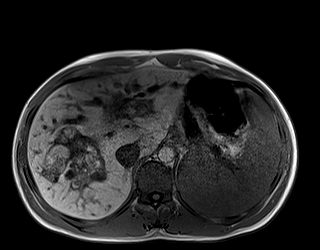
[im 112/160]
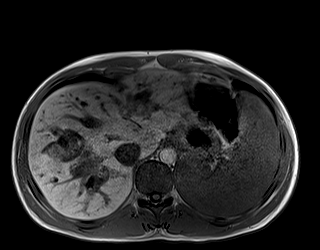
[im 120/160]
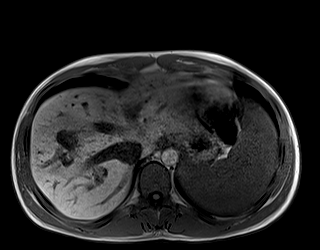
[im 128/160]
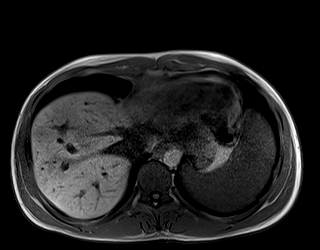
[im 136/160]
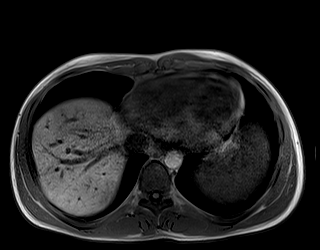
[im 144/160]
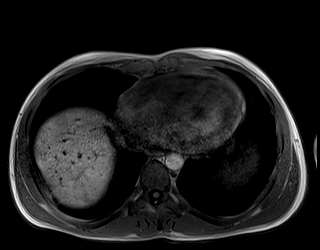
[im 152/160]
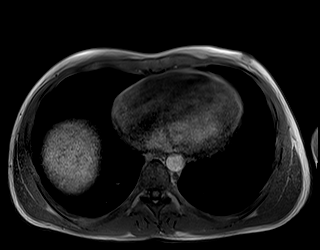
[im 160/160]
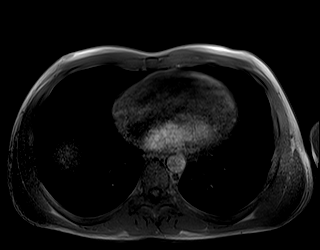

[Series 6001: T2 · axial · 6.0mm · 1.48mm/px · z∈[-97,+126]mm · 4 of 32 slices shown (2 of 2)]
[im 1/32]
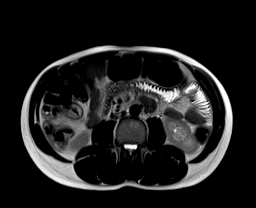
[im 11/32]
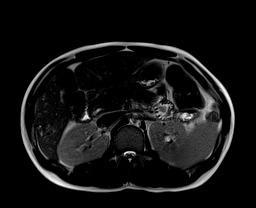
[im 21/32]
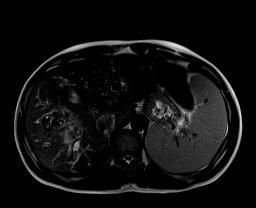
[im 32/32]
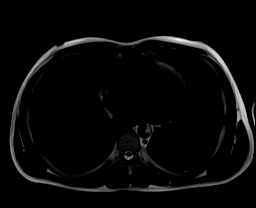

[Series 9001: MRCP · coronal · 3.0mm · 1.09mm/px · 4 of 30 slices shown]
[im 1/30]
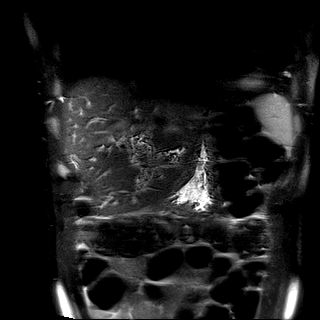
[im 10/30]
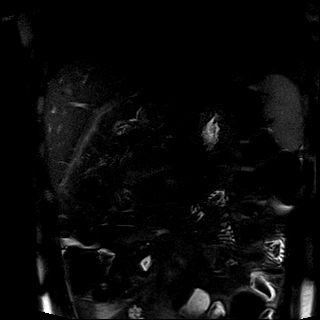
[im 20/30]
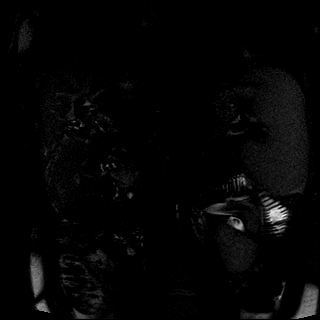
[im 30/30]
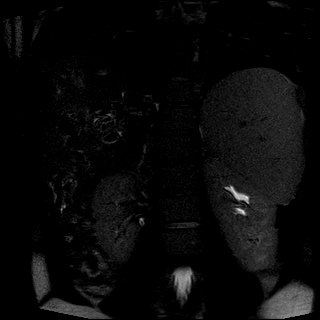

[41 of 48 positions shown; findings below may reference images not displayed]

FINDINGS: The lung bases, heart, distal esophagus, and stomach are unremarkable. No evidence of bowel obstruction or bowel inflammation.
There is no significant hepatic steatosis. Hepatic contour is normal.
The gallbladder is surgically absent. There is multifocal intrahepatic biliary dilation with T1 hyperintense and low T2 signal contents. The common bile duct is normal in caliber.
The spleen is enlarged and measures 14.9 cm in craniocaudal oblique dimension.
The adrenal glands and kidneys are normal.
Normal caliber of the aorta. No suspicious lymphadenopathy. No suspicious marrow signal.
IMPRESSION: 
IMPRESSION: Multifocal intrapelvic ductal dilation with T1 hyperintense and T2 hypointense contents. Differential diagnosis includes cholestasis, sickle cell or ischemic cholangiopathy, and infectious cholangitis. The MR appearance would be atypical for primary sclerosing cholangitis.

## 2023-02-15 NOTE — Telephone Encounter (Signed)
Called to check on pt  He is currently admitted to the hospital   Has had a CT and just completed an MRI about 30 minutes ago  Feels he is being taken care of well at this time  Clinical research associate offered he can call the office if he has questions  He will call when he is discharged  Routing to provider an an [place]

## 2023-02-19 NOTE — Progress Notes (Signed)
[facility]    Visit Note     REASON FOR VISIT      Chief Complaint   Patient presents with    Follow-up         PRIMARY DIAGNOSIS      Sickle cell Disease    Subjective     SUBJECTIVE      Fatigue  - has been feeling more and more fatigued lately  - denies overt bleeding, fever, sick contacts  - no significant change to his daily life, not needing to work different shifts or change his sleep schedule  - feels sleep is ok, doesn't have trouble sleeping but doesn't feel as refreshed as he once did  - history of iron deficiency anemia, not currently taking supplements   - denies new jaundice, yellow color of his eyes  - no increased pain  - leaving town soon, will follow up when he returns           Review of Systems as per HPI above    Past Medical History, Social History, Family History, and Medications/allergies reviewed during this visit    Current Outpatient Medications   Medication    mesalamine (ASACOL HD) 800 mg tablet    Melatonin 10 MG CAPS    oxyCODONE (ROXICODONE) 10 mg immediate release tablet    ondansetron (ZOFRAN-ODT) 4 MG disintegrating tablet    cyclobenzaprine (FLEXERIL) 5 mg tablet    naloxone (NARCAN) 4 mg/0.1 mL nasal spray     No current facility-administered medications for this visit.          Objective     OBJECTIVE      BP 129/76 (BP Location: Right arm, Patient Position: Sitting, Cuff Size: adult)   Pulse 97   Temp 36.4 C (97.5 F) (Temporal)   Wt 88 kg (194 lb 1.6 oz)   SpO2 100% Comment: room air  BMI 25.61 kg/m     Physical Exam  Constitutional:       General: He is not in acute distress.     Appearance: He is not ill-appearing.   HENT:      Head: Normocephalic and atraumatic.      Nose: Nose normal. No congestion.      Mouth/Throat:      Mouth: Mucous membranes are moist.   Eyes:      General: No scleral icterus.     Extraocular Movements: Extraocular movements intact.      Conjunctiva/sclera: Conjunctivae normal.   Cardiovascular:      Rate and Rhythm: Normal  rate and regular rhythm.   Pulmonary:      Effort: Pulmonary effort is normal. No respiratory distress.   Abdominal:      General: There is no distension.      Palpations: Abdomen is soft.      Tenderness: There is no abdominal tenderness.   Musculoskeletal:         General: No swelling or deformity. Normal range of motion.      Right lower leg: No edema.      Left lower leg: No edema.   Skin:     General: Skin is warm and dry.      Capillary Refill: Capillary refill takes less than 2 seconds.      Coloration: Skin is not jaundiced.   Neurological:      Mental Status: He is alert and oriented to person, place, and time.      Cranial Nerves: No cranial  nerve deficit.      Motor: No weakness.      Gait: Gait normal.                ASSESSMENT / DIAGNOSIS     1. Other fatigue  - undifferentiated fatigue. Could be related to poor sleep which would need a sleep study to confirm. Will start with evaluating for iron deficiency and anemia along with hypothyroidism  - TIBC; Future  - Ferritin; Future  - Transferrin; Future  - Reticulocytes; Future  - Vitamin D; Future  - Folate; Future  - TSH; Future      Orders Placed This Encounter    TIBC    Ferritin    Transferrin    Reticulocytes    Vitamin D    Folate    TSH       There are no Patient Instructions on file for this visit.       --Patient instructed to call if symptoms are not improving or worsening  --Follow-up arranged  No follow-ups on file.     I personally spent 30 minutes on the calendar day of the encounter, including pre and post visit work reviewing the EMR and management of this patient.     Electronically signed by [name], DO   [facility], Phone: [phone]

## 2023-02-20 ENCOUNTER — Other Ambulatory Visit: Payer: Self-pay | Admitting: Primary Care

## 2023-02-20 NOTE — Telephone Encounter (Signed)
Search Terms: Atlas Shihadeh, 1996-08-02  Search Date: 02/20/2023 14:17:13 PM  Searching on behalf of: [id number] - Cameron [name]  The Drug Utilization Report below displays all of the controlled substance prescriptions, if any, that your patient has filled in the last twelve months. The information displayed on this report is compiled from pharmacy submissions to the Department, and accurately reflects the information as submitted by the pharmacies.  This report was requested by: [name]  Reference #: [id number]  Practitioner Count: 3  Pharmacy Count: 1  Current Opioid Prescriptions: 1  Current Benzodiazepine Prescriptions: 0  Current Stimulant Prescriptions: 0        Patient Demographic Information Cameron [name])       [facility] First Name Last Name Birth Date Gender Street Address Cameron [name] Zip Code   A Osborne Copus 1996-01-12 Male [address] [place] [address]   B Larnell Sitzman 12/18/1995 Male [address] [place] [address]        Search:  PDI My Rx Current Rx Drug Type Rx Written Rx Dispensed Drug Quantity Days Supply Prescriber Name Prescriber [facility] # Payment Method Dispenser   A U Y O 02/07/2023 02/09/2023 oxycodone hcl (ir) 10 mg tab 84 14 [name] [id number] Other [facility]   B [name] 01/24/2023 01/26/2023 oxycodone hcl (ir) 10 mg tab 84 14 [name] [id number] Other [facility]   B [name] 01/10/2023 01/12/2023 oxycodone hcl (ir) 10 mg tab 84 14 Cameron Cellar MD [id number] Other [name] 12/26/2022 12/29/2022 oxycodone hcl (ir) 10 mg tab 84 14 Cameron Cellar MD [id number] [name] 12/14/2022 12/15/2022 oxycodone hcl (ir) 10 mg tab 84 14 Cameron Cellar MD [id number] [name] 11/30/2022 12/01/2022 oxycodone hcl (ir) 10 mg tab 84 14 Cameron Cellar MD [id number] [name] 11/15/2022 11/17/2022  oxycodone hcl (ir) 10 mg tab 84 14 Cameron Cellar MD [id number] [name] 11/03/2022 11/03/2022 oxycodone hcl (ir) 10 mg tab 84 14 Cameron Cellar MD [id number] [name] 10/19/2022 10/20/2022 oxycodone hcl (ir) 10 mg tab 84 14 Cameron Cellar MD [id number] Other [name] 10/03/2022 10/06/2022 oxycodone hcl (ir) 10 mg tab 84 14 [name] [id number] Other [facility]   B [name] 09/21/2022 09/22/2022 oxycodone hcl (ir) 10 mg tab 84 14 Cameron Cellar MD [id number] Other [name] 09/07/2022 09/08/2022 oxycodone hcl (ir) 10 mg tab 84 14 Cameron Cellar MD [id number] Other [name] 08/24/2022 08/25/2022 oxycodone hcl (ir) 10 mg tab 84 14 [name] [id number] [facility]   B [name] 08/11/2022 08/11/2022 oxycodone hcl (ir) 10 mg tab 84 14 [name] [id number] Other [facility]   [name] 07/24/2022 07/28/2022 oxycodone hcl (ir) 10 mg tab [address] [name] [id number] Other [facility]   [name] 07/10/2022 07/14/2022 oxycodone  hcl (ir) 10 mg tab [address] Cameron Cellar MD [id number] Other [name] 06/29/2022 06/30/2022 oxycodone hcl (ir) 10 mg tab [address] Cameron Cellar MD [id number] Other [name] 06/16/2022 06/16/2022 oxycodone hcl (ir) 10 mg tab 84 14 [name] [id number] Other [facility]   B [name] 06/01/2022 06/01/2022 oxycodone hcl (ir) 10 mg tab [address] [name] [id number] Other [name] 05/02/2022 05/04/2022 oxycodone-acetaminophen 10-325 mg tab 168 [address] MD [id number] Other [name] 04/05/2022 04/06/2022 oxycodone-acetaminophen 10-325 mg tab 168 [address],  (MD) [id number] Other [name] 03/08/2022 03/09/2022 oxycodone-acetaminophen 10-325 mg tab 168 [address] MD [id number] Other [facility]

## 2023-02-22 ENCOUNTER — Other Ambulatory Visit: Payer: Self-pay | Admitting: Primary Care

## 2023-02-22 ENCOUNTER — Other Ambulatory Visit: Payer: Self-pay

## 2023-02-22 IMAGING — CR Knee L
3 series · 3 of 3 positions shown · non-contrast
Comparison: None
There is no evidence of acute fracture. The joint spaces appear maintained. No effusion is seen.

FINAL REPORT:
Left knee 3 views:
CLINICAL INDICATION: Left knee pain

[AP]
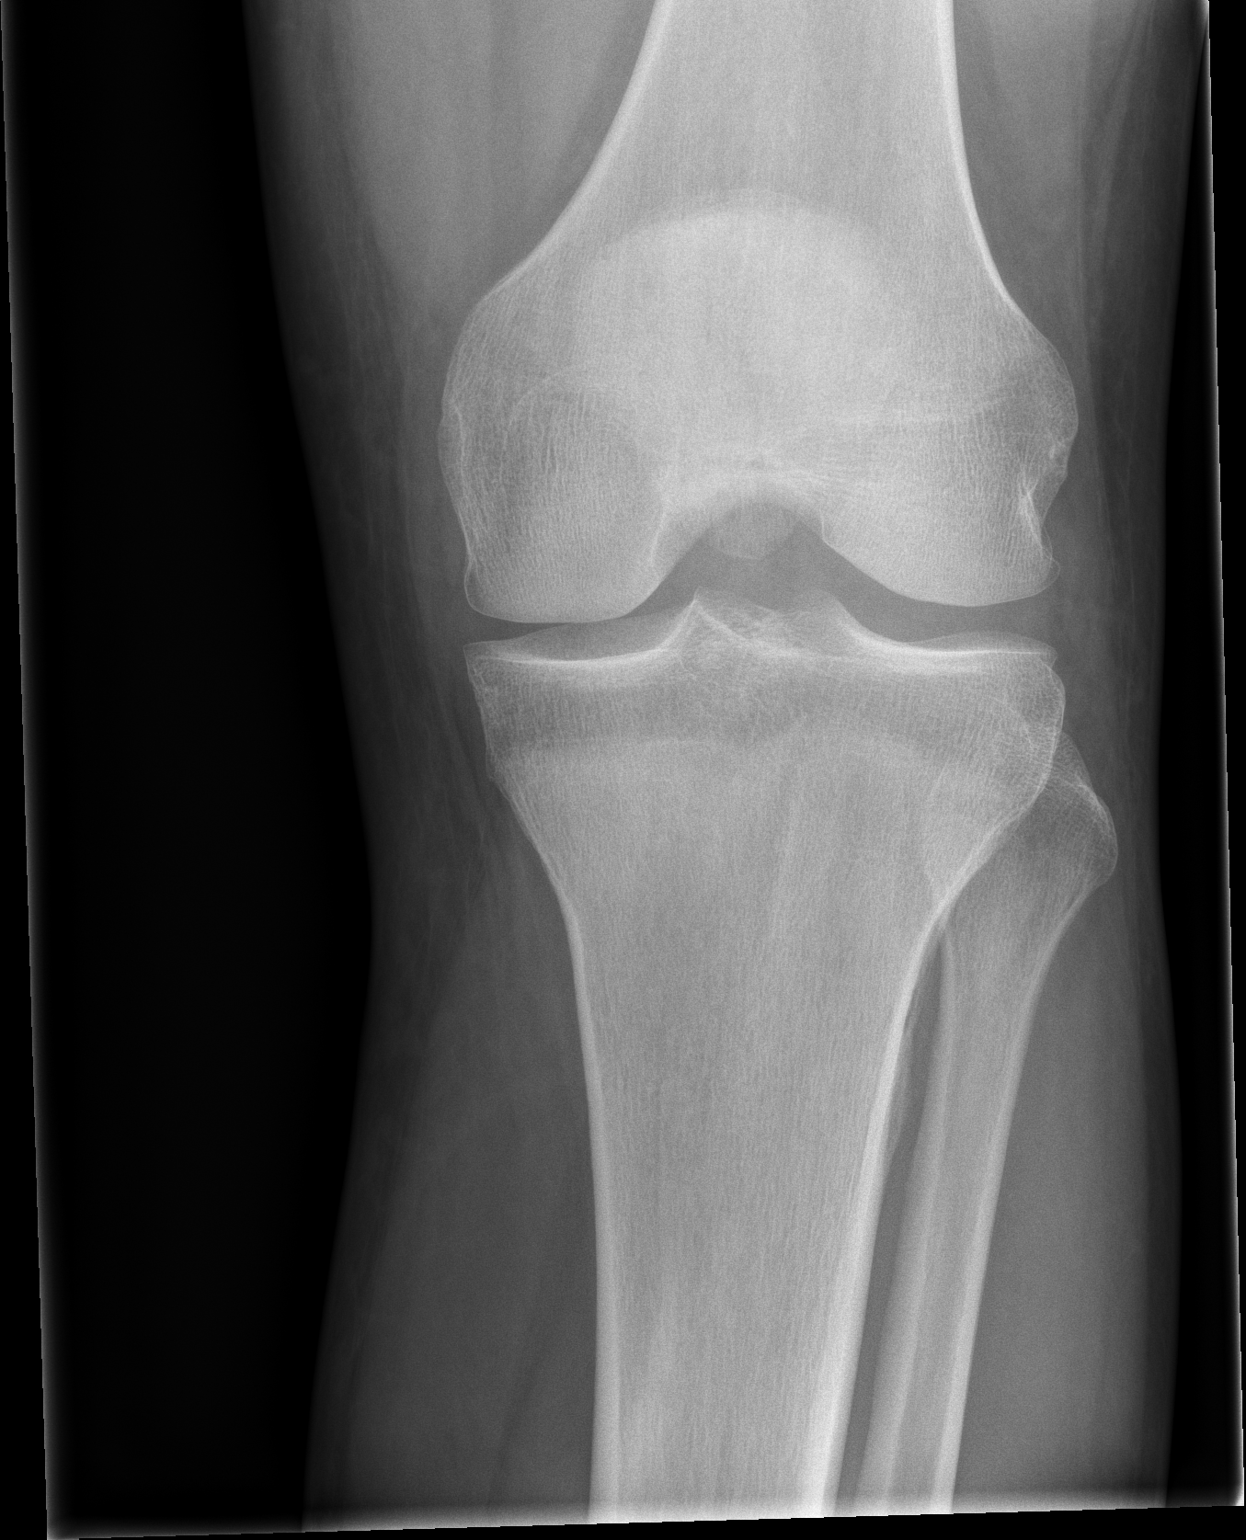

[left lateral (1 of 2)]
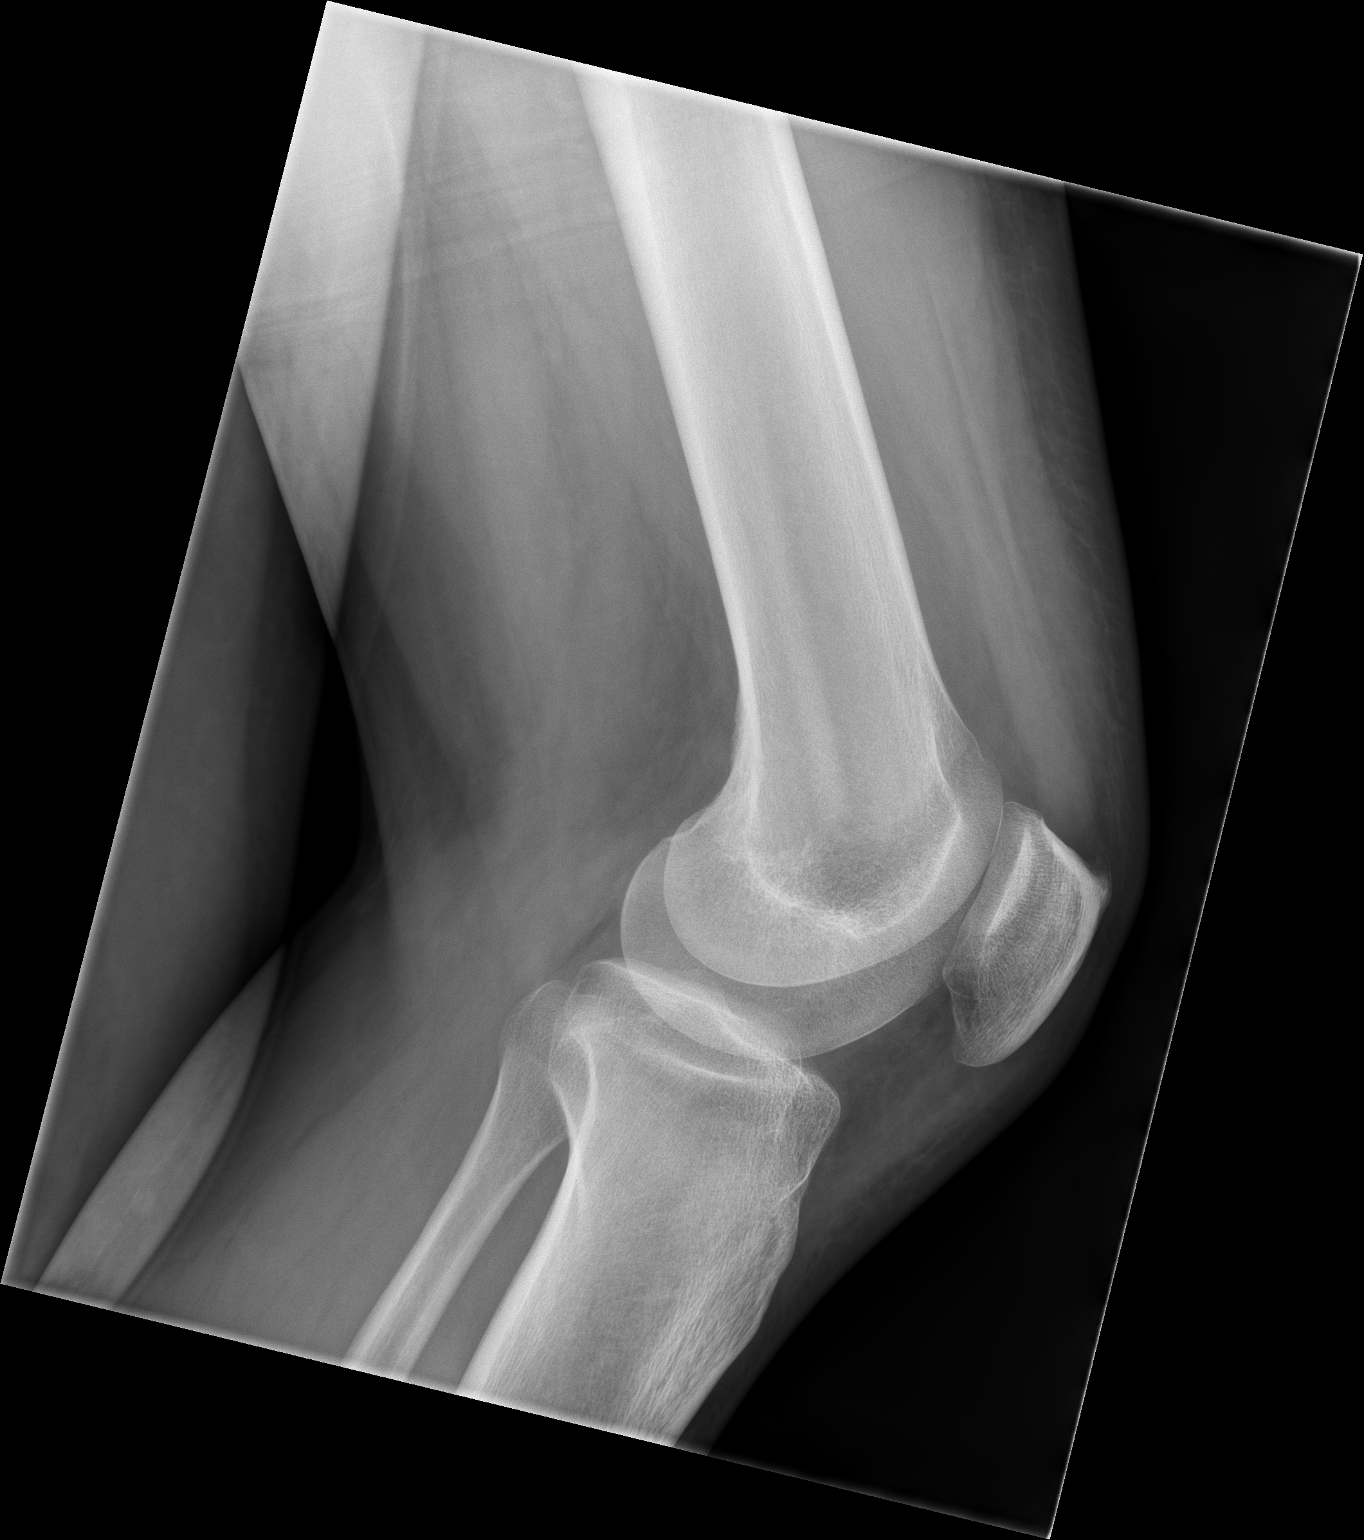

[left lateral (2 of 2)]
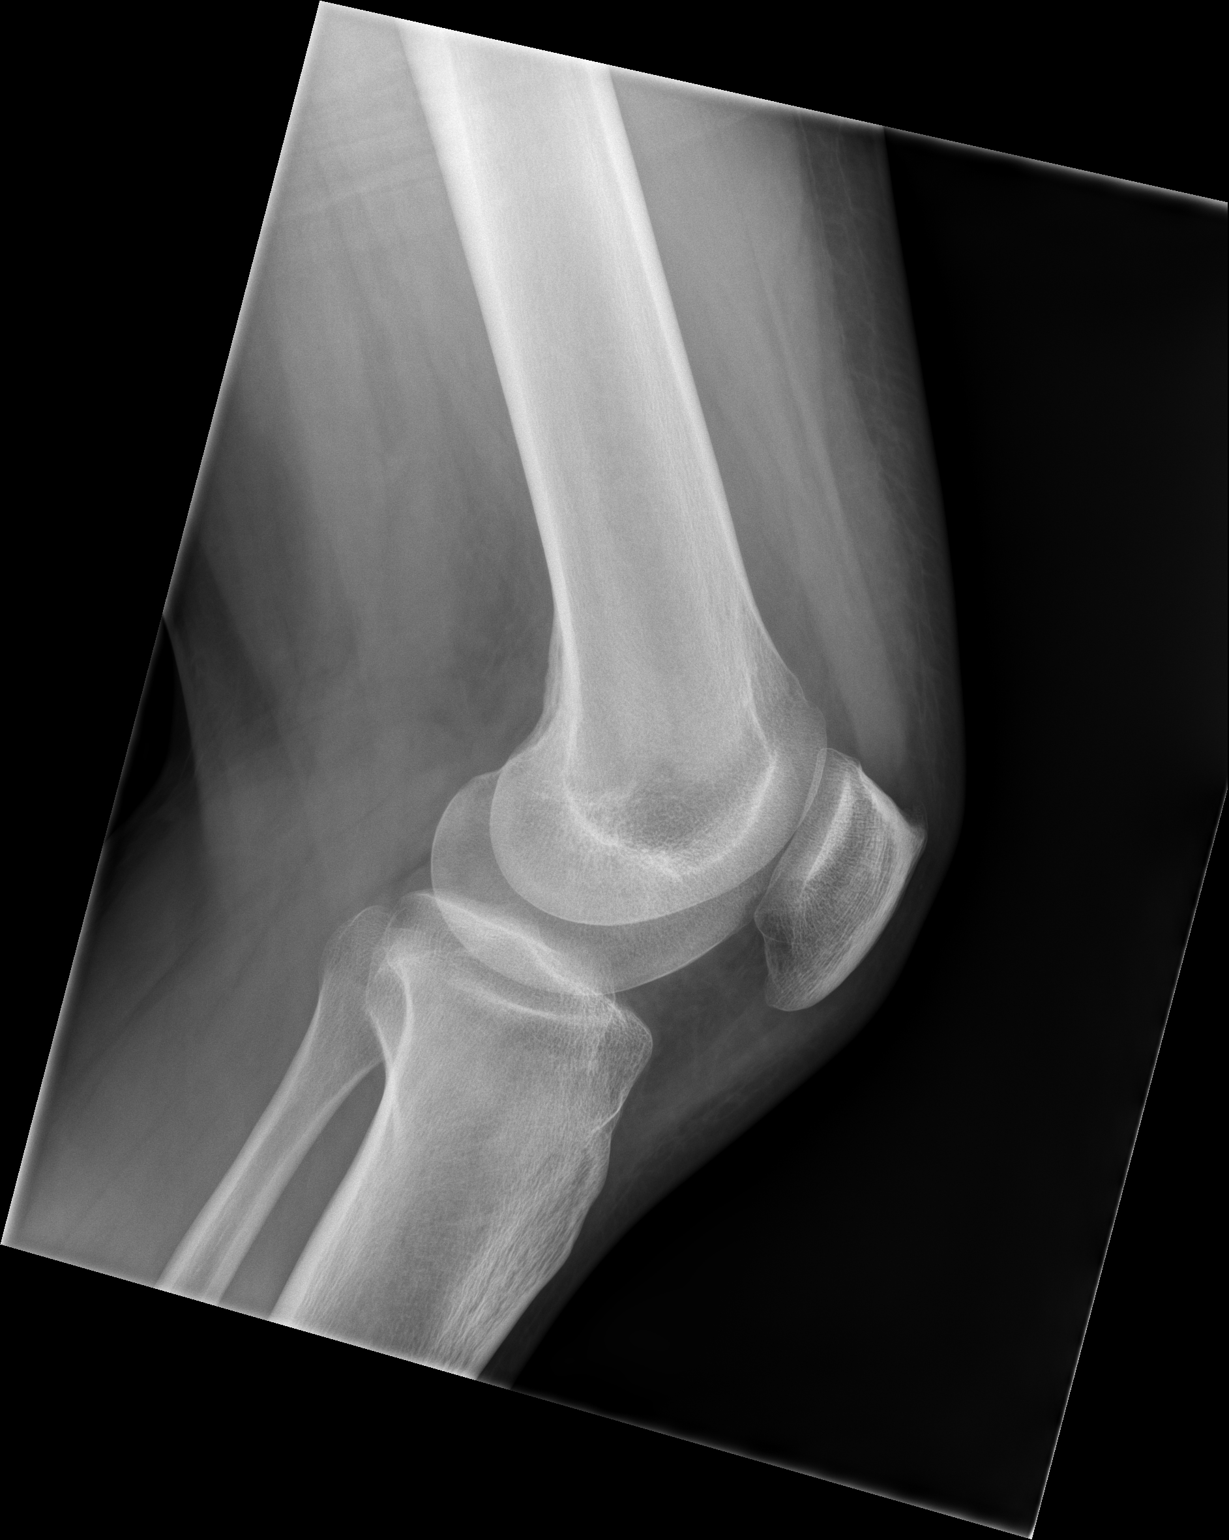

[3 of 3 positions shown; findings below may reference images not displayed]

IMPRESSION: Unremarkable left knee.

## 2023-02-22 IMAGING — DX XR KNEE 2 VIEWS LEFT
5 series · 8 of 8 positions shown · non-contrast
Comparison: None available.

Left knee pain
FINAL REPORT:
XR KNEE 2 VIEWS RIGHT, XR KNEE 2 VIEWS LEFT
INDICATION: Pain in the knees.

[lateral (1 of 2)]
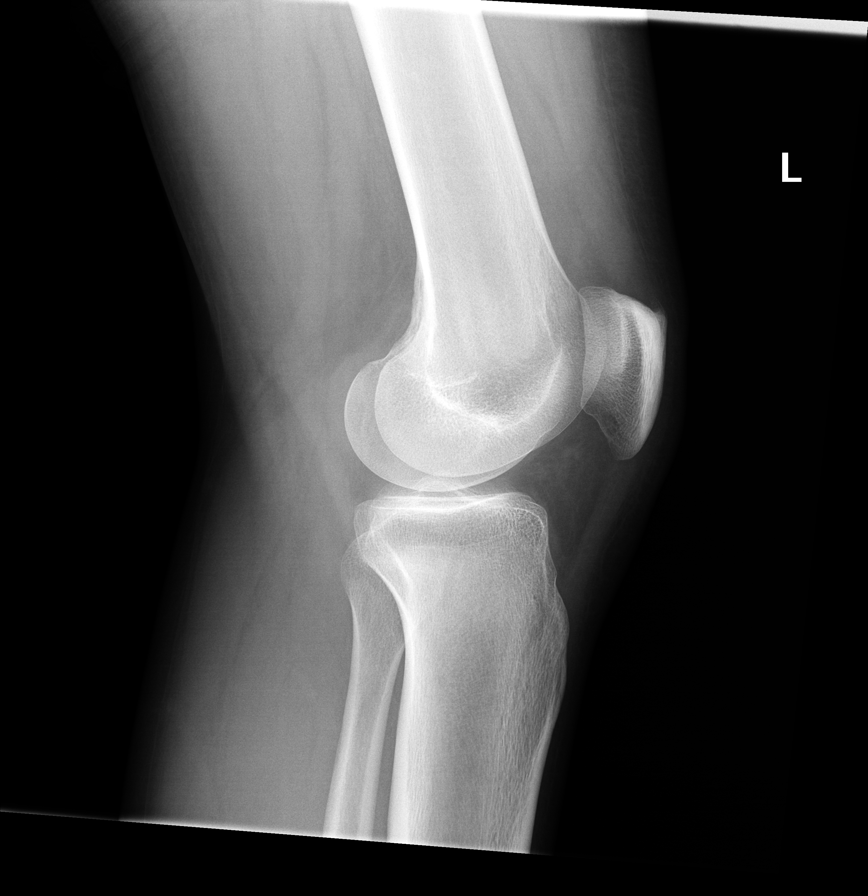

[Series 8708: AP · right · 0.10mm/px · 4 of 4 slices shown (1 of 3)]
[im 1/4]
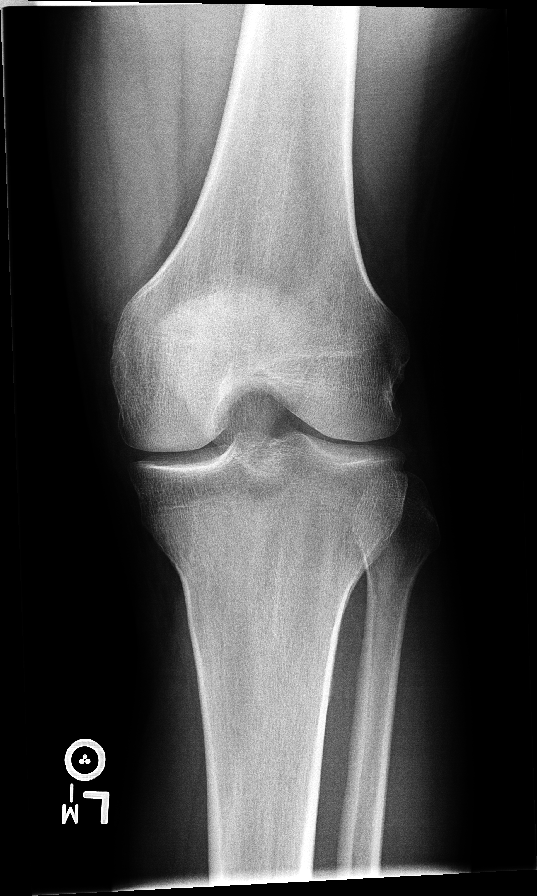
[im 2/4]
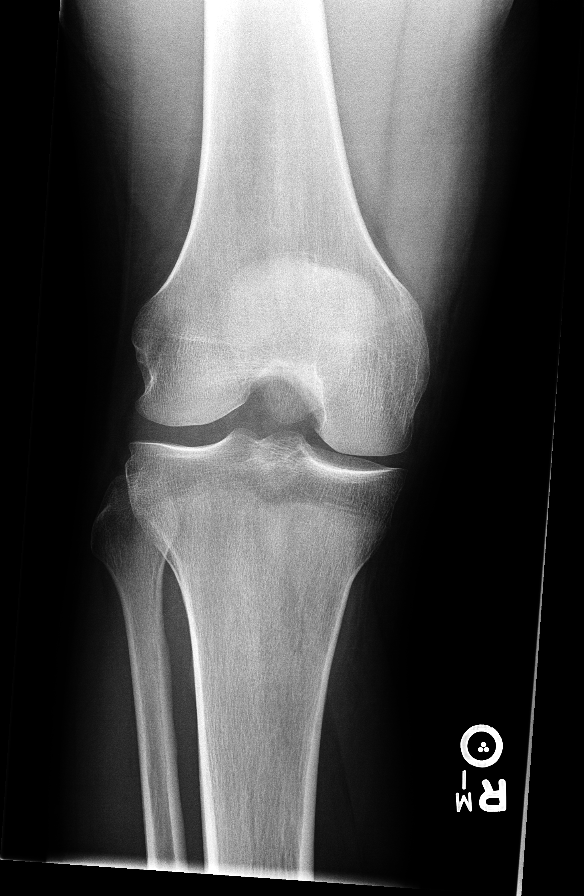
[im 3/4]
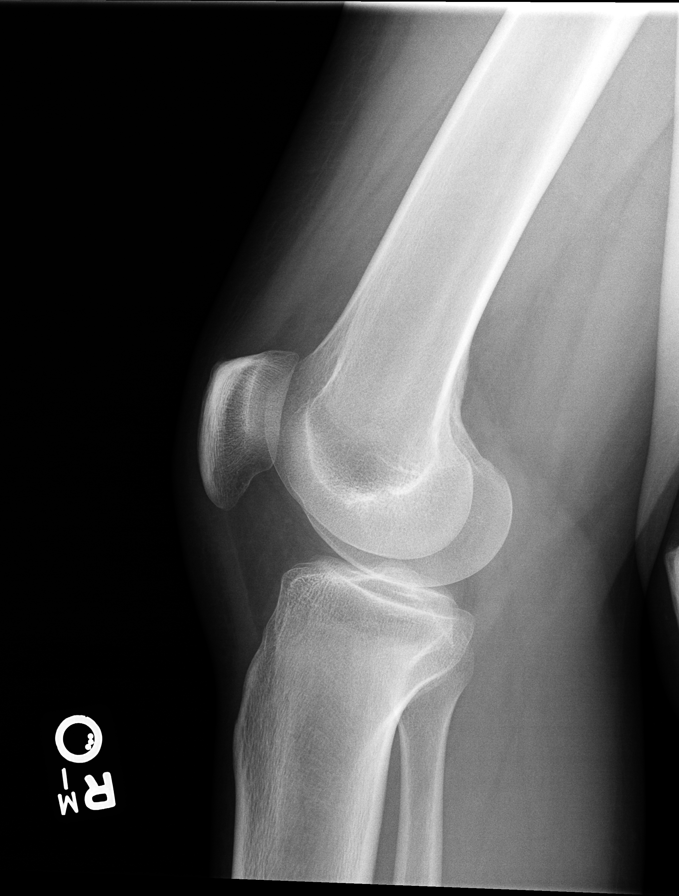
[im 4/4]
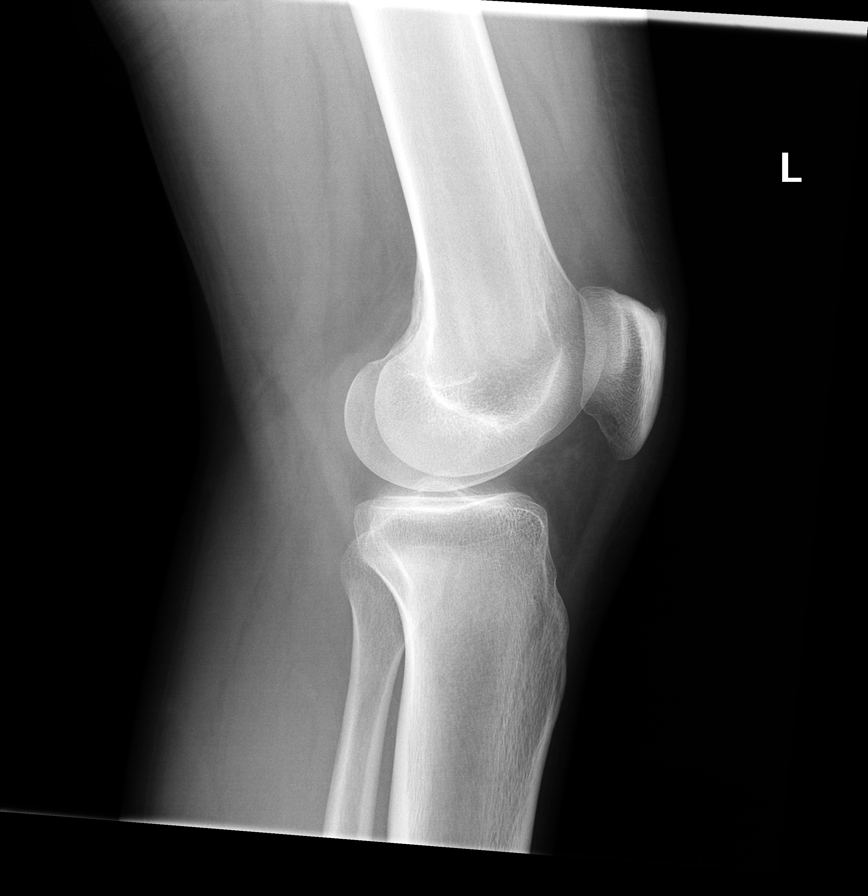

[AP (2 of 3)]
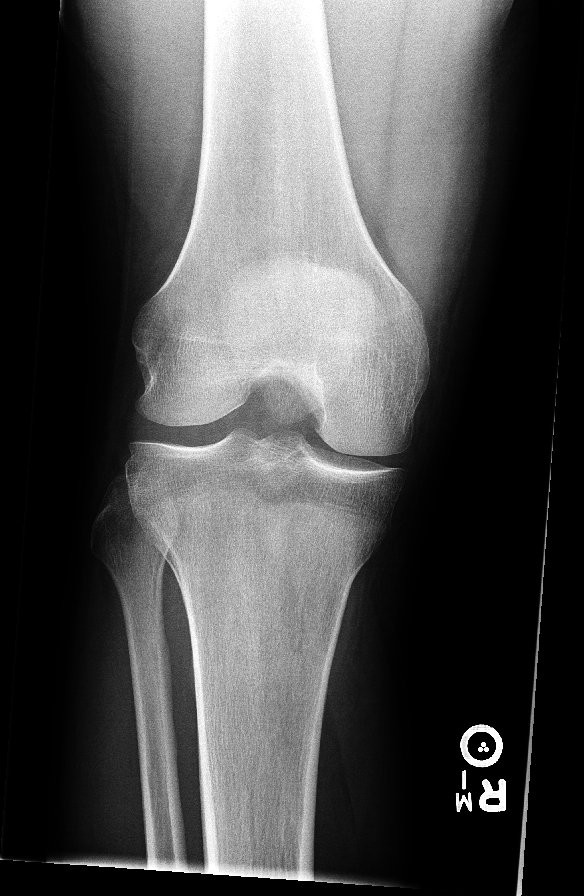

[AP (3 of 3)]
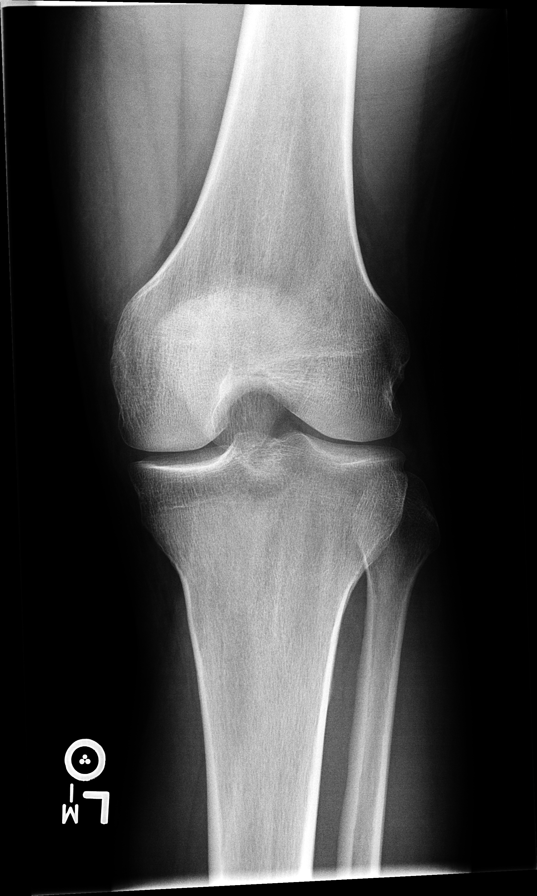

[lateral (2 of 2)]
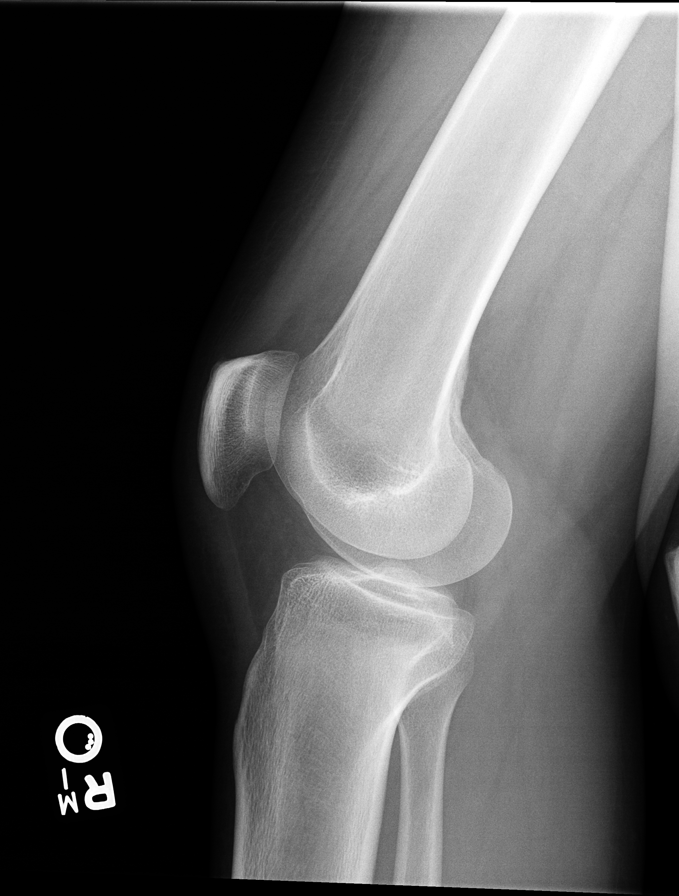

[8 of 8 positions shown; findings below may reference images not displayed]

FINDINGS: 2 views of each knee were obtained.
No acute fracture or malalignment. Joint spaces are maintained. No soft tissue swelling, radiopaque foreign body or gas. Small left distal quadriceps enthesophyte. No joint effusion.
IMPRESSION: No acute osseous abnormality.

## 2023-02-23 ENCOUNTER — Other Ambulatory Visit: Payer: Self-pay | Admitting: Internal Medicine

## 2023-02-23 ENCOUNTER — Other Ambulatory Visit: Payer: Self-pay

## 2023-02-23 MED ORDER — OXYCODONE HCL 10 MG PO TABS *I*
10.0000 mg | ORAL_TABLET | ORAL | 0 refills | Status: DC | PRN
Start: 2023-02-23 — End: 2023-03-06
  Filled 2023-02-23: qty 84, 14d supply, fill #0

## 2023-02-25 ENCOUNTER — Other Ambulatory Visit: Payer: Self-pay

## 2023-02-25 MED ORDER — FOLIC ACID 1 MG PO TABS *I*
1.0000 mg | ORAL_TABLET | ORAL | 5 refills | Status: DC
Start: 2023-02-25 — End: 2023-10-09
  Filled 2023-02-25 – 2023-03-06 (×2): qty 30, 30d supply, fill #0
  Filled 2023-04-17: qty 30, 30d supply, fill #1
  Filled 2023-05-29: qty 30, 30d supply, fill #2

## 2023-03-04 IMAGING — DX XR CHEST 1 VIEW
1 series · 1 of 1 positions shown · non-contrast
Comparison: None.

sob
FINAL REPORT:
XR CHEST 1 VIEW
INDICATION: Shortness of breath.
TECHNIQUE: AP view of the chest.

[AP]
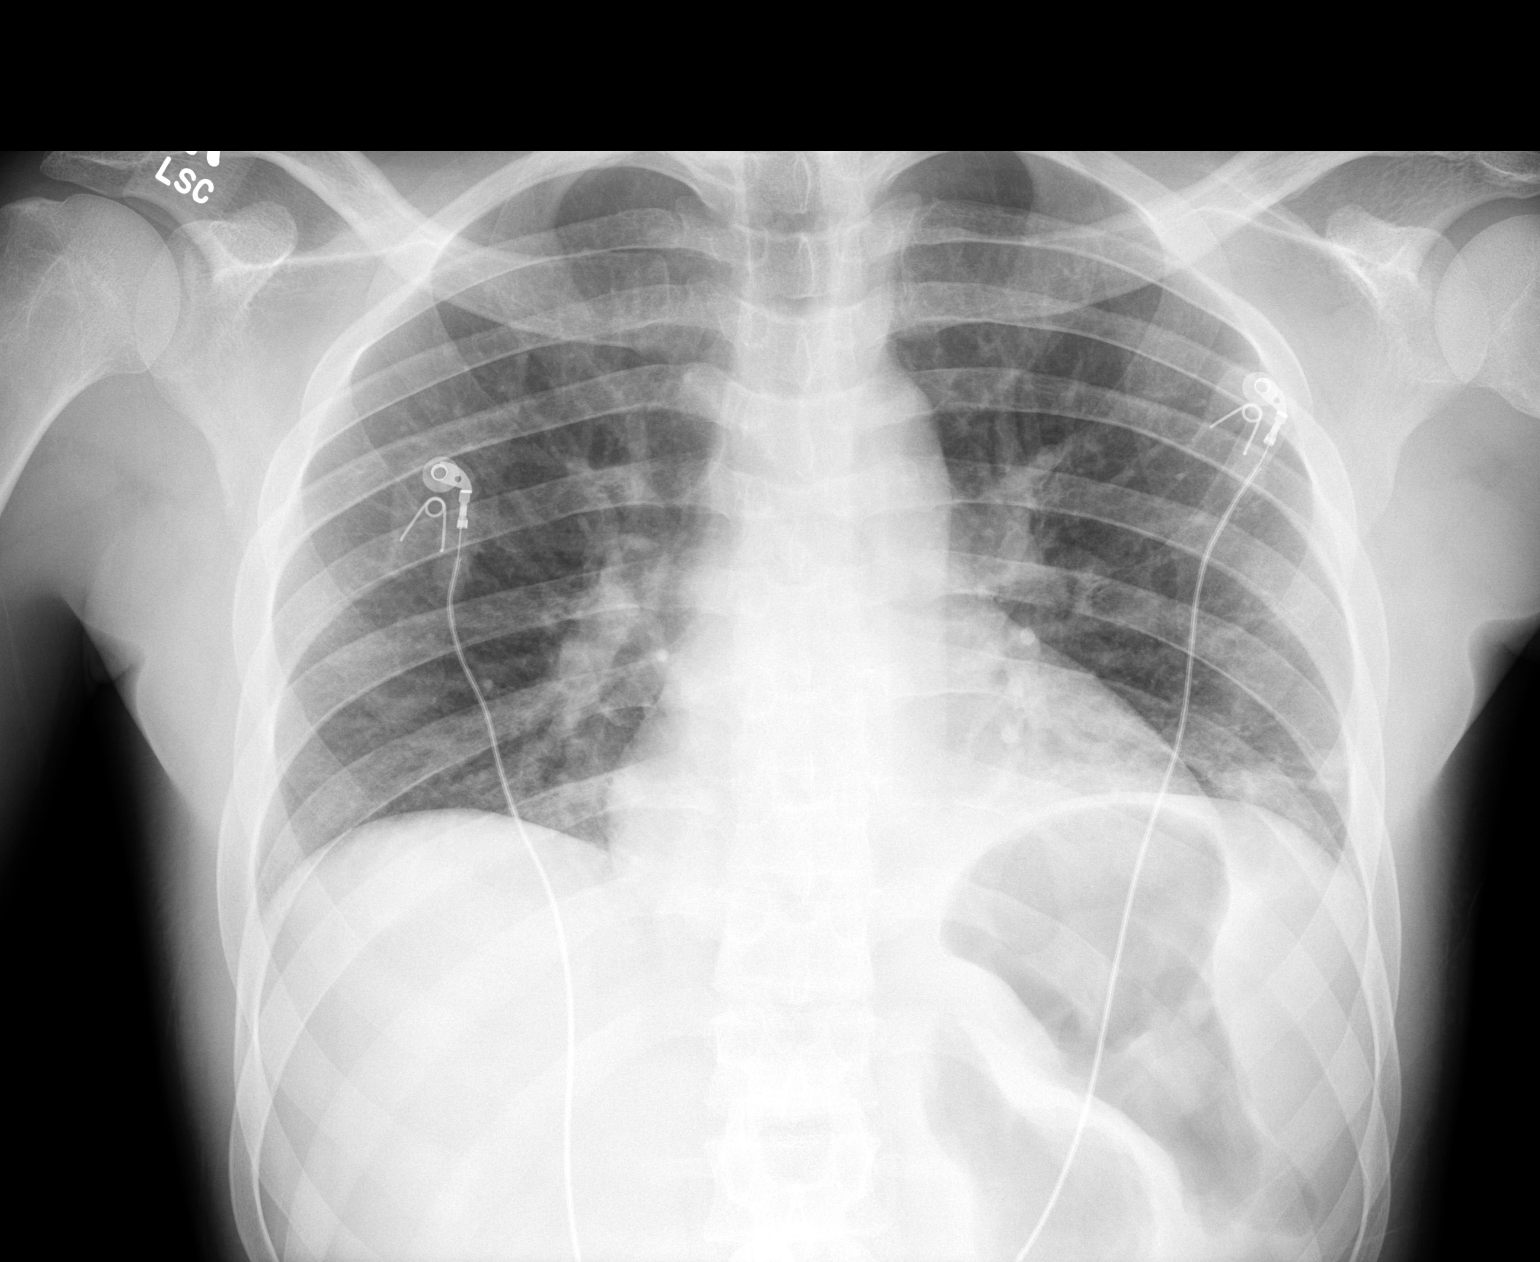

[1 of 1 positions shown; findings below may reference images not displayed]

FINDINGS: The cardiac silhouette is within normal limits. The mediastinum is in the midline. There is a mild left basal opacity.. The costophrenic angles are sharp. No pneumothorax. The bones and soft tissues are unremarkable.
IMPRESSION: 
IMPRESSION: Mild left basal airspace opacity which may represent atelectasis or pneumonia.

## 2023-03-04 IMAGING — CT CT CHEST PULMONARY EMBOLISM WITH IV CONTRAST
2 of 6 series · 13 of 36 positions shown · IV contrast (agent unspecified)
Comparison: Chest radiograph from the same date.

Sob, hx of sickle cell
No hx of sx or cancer in area of interest
FINAL REPORT:
CT CHEST PULMONARY EMBOLISM WITH IV CONTRAST
INDICATION: Shortness of breath.
TECHNIQUE: CT of the chest was performed with intravenous contrast per PE protocol. MIP reconstructions were submitted for evaluation.
DLP: 406.87 mGy-cm

[Series 2: pe chest · axial · 0.79mm/px · z∈[-292,-22]mm · 10 of 133 slices shown]
[im 13/133  lung]
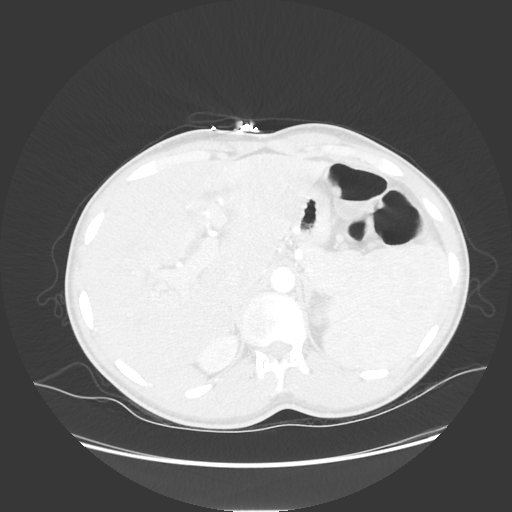
[im 25/133  mediastinal]
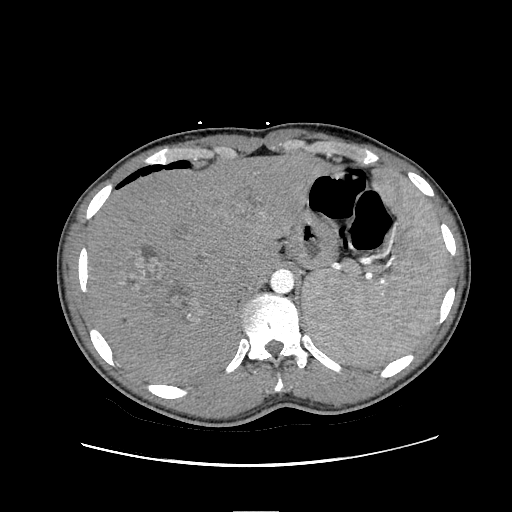
[im 37/133  lung]
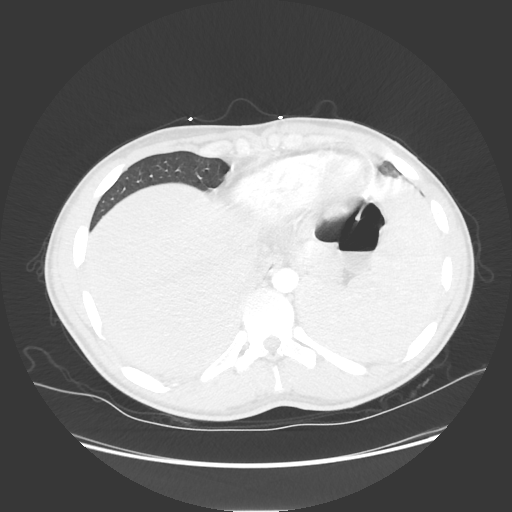
[im 49/133  mediastinal]
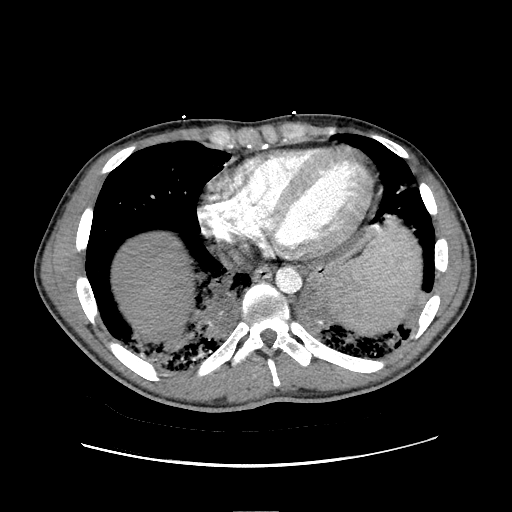
[im 61/133  lung]
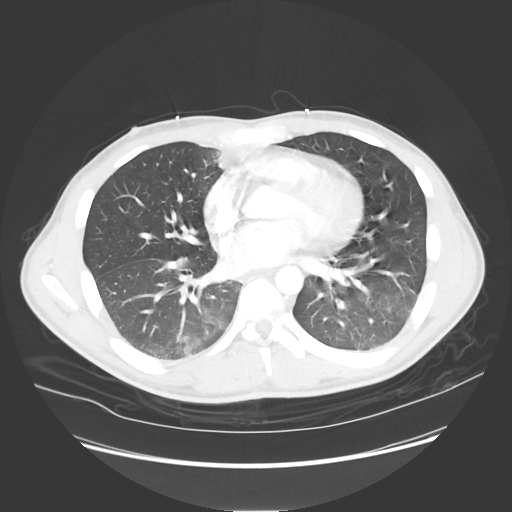
[im 73/133  mediastinal]
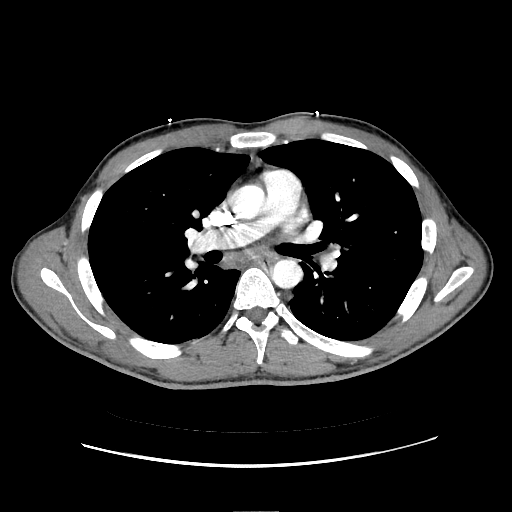
[im 85/133  lung]
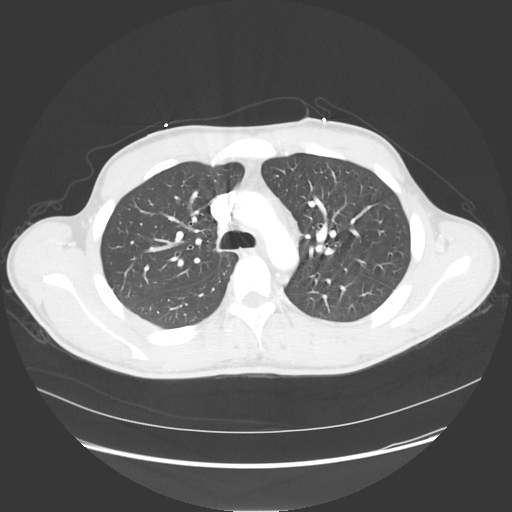
[im 97/133  mediastinal]
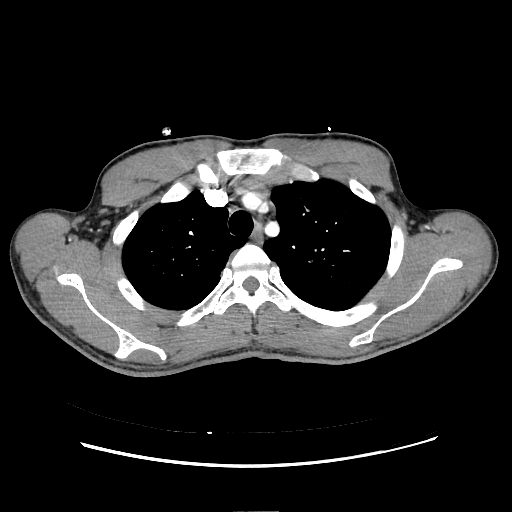
[im 109/133  lung]
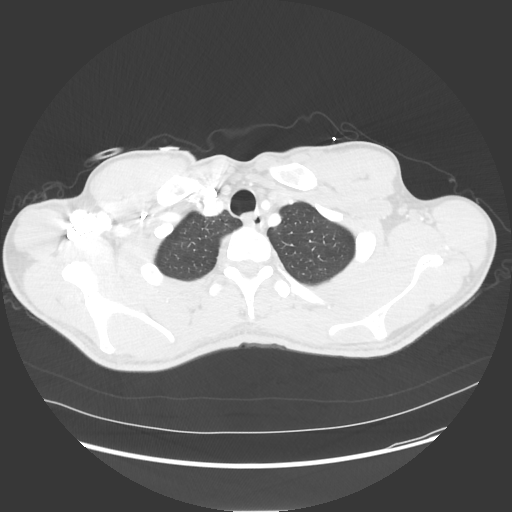
[im 121/133  mediastinal]
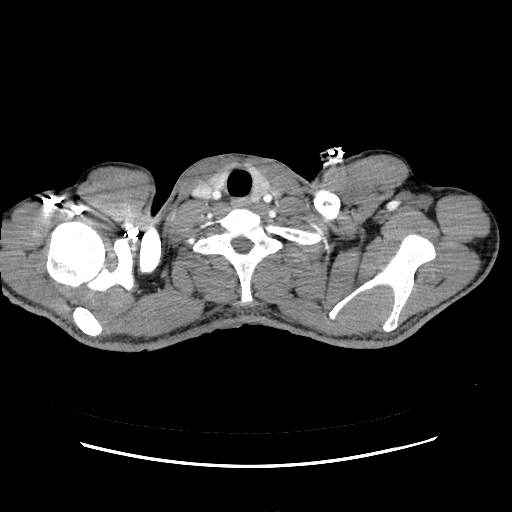

[Series 601: sag standard 2x2 · sagittal · 0.79mm/px · 3 of 204 slices shown]
[im 41/204  mediastinal]
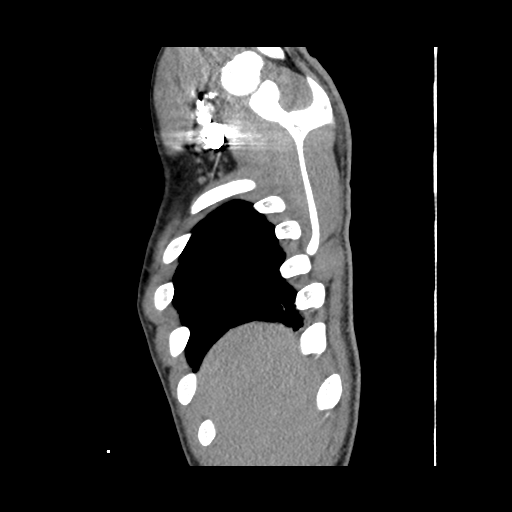
[im 82/204  mediastinal]
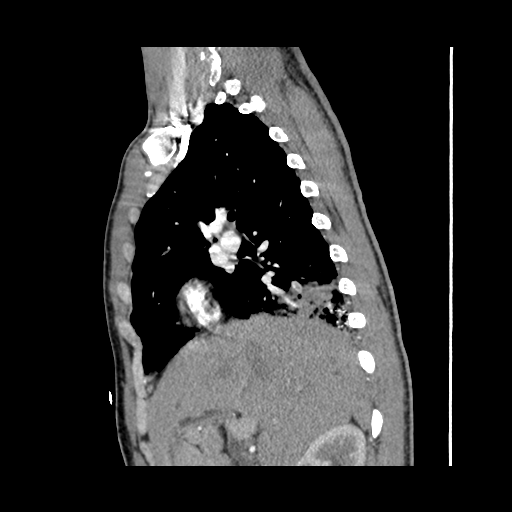
[im 122/204  mediastinal]
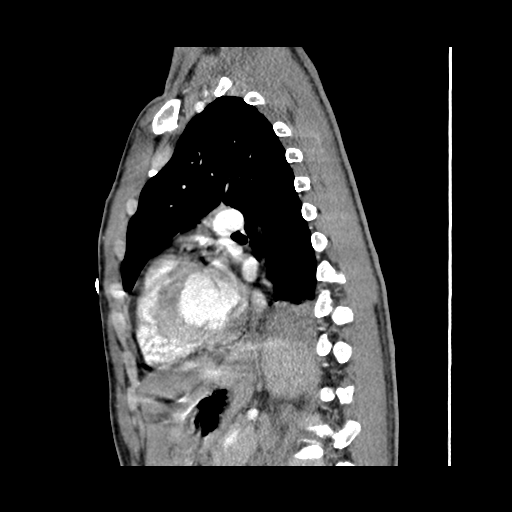

[13 of 36 positions shown; findings below may reference images not displayed]

FINDINGS: Lines and Tubes: None.
Thyroid: Unremarkable.
Lymph Nodes: No suspicious lymphadenopathy.
Heart: Normal heart size. No pericardial effusion.
Pulmonary Arteries: No evidence of pulmonary embolism.
Aorta: Normal caliber. No dissection.
Trachea and Main Bronchi: Patent.
Pleura: No pleural effusion or pneumothorax.
Lungs: Patchy areas of consolidation in the lung bases and lingula.
Pulmonary Nodules: No suspicious nodules.
Upper Abdomen: Prominent areas of periportal enhancement in the liver which may be due to prominent vessels. Splenomegaly.
Soft Tissues: Unremarkable.
Bones: No suspicious lytic or blastic lesions.
IMPRESSION: 
IMPRESSION: 1. No evidence of pulmonary embolism.
2. Patchy areas of consolidation in the lung bases and lingula consistent with pneumonia.
All CT scans at this facility use iterative reconstruction technique, dose modulation, and/or weight based dosing when appropriate to reduce radiation dose to as low as reasonably achievable.

## 2023-03-05 ENCOUNTER — Other Ambulatory Visit: Payer: Self-pay

## 2023-03-06 ENCOUNTER — Other Ambulatory Visit: Payer: Self-pay

## 2023-03-06 ENCOUNTER — Other Ambulatory Visit: Payer: Self-pay | Admitting: Primary Care

## 2023-03-06 NOTE — Telephone Encounter (Signed)
Confidential Drug Report  Search Terms: Cameron [name], 1996/04/21  Search Date: 03/06/2023 16:37:04 PM  Searching on behalf of: [id number] - [name]  The Drug Utilization Report below displays all of the controlled substance prescriptions, if any, that your patient has filled in the last twelve months. The information displayed on this report is compiled from pharmacy submissions to the Department, and accurately reflects the information as submitted by the pharmacies.  This report was requested by: Gerrie Nordmann  Reference #: [id number]  Practitioner Count: 3  Pharmacy Count: 1  Current Opioid Prescriptions: 1  Current Benzodiazepine Prescriptions: 0  Current Stimulant Prescriptions: 0        Patient Demographic Information [facility])       [name] First Name Last Name Birth Date Gender Street Address [name] Zip Code   [name] Sep 04, 1996 Male [address] [name] [address]   [name] 27-Nov-1995 Male [address] [name] [address]        Search:  PDI My Rx Current Rx Drug Type Rx Written Rx Dispensed Drug Quantity Days Supply Prescriber Name Prescriber [facility] # Payment Method Dispenser   A U Y O 02/23/2023 02/23/2023 oxycodone hcl (ir) 10 mg tab 84 14 [name] MD [id number] Other [facility]   [name] 02/07/2023 02/09/2023 oxycodone hcl (ir) 10 mg tab 84 14 [name] [id number] Other [facility]   [name] 01/24/2023 01/26/2023 oxycodone hcl (ir) 10 mg tab 84 14 [name] [id number] Other [facility]   [name] 01/10/2023 01/12/2023 oxycodone hcl (ir) 10 mg tab 84 14 [name] MD [id number] Other [name] 12/26/2022 12/29/2022 oxycodone hcl (ir) 10 mg tab 84 14 [name] MD [id number] Other [name] 12/14/2022 12/15/2022 oxycodone hcl (ir) 10 mg tab 84 14 [name] MD [id number] Other [name]  11/30/2022 12/01/2022 oxycodone hcl (ir) 10 mg tab 84 14 [name] MD [id number] [facility]   [name] 11/15/2022 11/17/2022 oxycodone hcl (ir) 10 mg tab 84 14 [name] MD [id number] Other [name] 11/03/2022 11/03/2022 oxycodone hcl (ir) 10 mg tab 84 14 [name] MD [id number] Other [name] 10/19/2022 10/20/2022 oxycodone hcl (ir) 10 mg tab 84 14 [name] MD [id number] Other [name] 10/03/2022 10/06/2022 oxycodone hcl (ir) 10 mg tab 84 14 [name] [id number] Other [facility]   [name] 09/21/2022 09/22/2022 oxycodone hcl (ir) 10 mg tab 84 14 [name] MD [id number] Other [name] 09/07/2022 09/08/2022 oxycodone hcl (ir) 10 mg tab 84 14 [name] MD [id number] Other [name] 08/24/2022 08/25/2022 oxycodone hcl (ir) 10 mg tab 84 14 [name] [id number] Other [facility]   [name] 08/11/2022 08/11/2022 oxycodone hcl (ir) 10 mg tab 84 14 [name] [id number] Other [facility]   B U  [name] 07/24/2022 07/28/2022 oxycodone hcl (ir) 10 mg tab [address] [name] [id number] Other [facility]   [name] 07/10/2022 07/14/2022 oxycodone hcl (ir) 10 mg tab 84 14 [name] MD [id number] Other [name] 06/29/2022 06/30/2022 oxycodone hcl (ir) 10 mg tab [address] [name] MD [id number] Other [name] 06/16/2022 06/16/2022 oxycodone hcl (ir) 10 mg tab 84 14 [name] [id number] Other [facility]   [name] 06/01/2022 06/01/2022 oxycodone hcl (ir) 10 mg tab [address] [name] [id number] Other [name] 05/02/2022 05/04/2022 oxycodone-acetaminophen 10-325 mg tab 168 28 [name] MD [id number] Other [name] 04/05/2022 04/06/2022 oxycodone-acetaminophen 10-325 mg tab 168 28 [name], [name], (MD) [id number] Other [name] 03/08/2022 03/09/2022 oxycodone-acetaminophen 10-325 mg tab 168 28 [name] MD [id number] Other [facility]     Showing 1 to 23 of 23 entries     * - Details of Drug Type : O = Opioid, B = Benzodiazepine, S = Stimulant  * - Drugs marked with an asterisk are compound drugs. If the

## 2023-03-08 ENCOUNTER — Other Ambulatory Visit: Payer: Self-pay

## 2023-03-08 MED ORDER — OXYCODONE HCL 10 MG PO TABS *I*
10.0000 mg | ORAL_TABLET | ORAL | 0 refills | Status: DC | PRN
Start: 2023-03-08 — End: 2023-03-20
  Filled 2023-03-08: qty 84, 14d supply, fill #0

## 2023-03-09 ENCOUNTER — Other Ambulatory Visit: Payer: Self-pay

## 2023-03-09 IMAGING — US US UPPER EXTREMITY VEINS RIGHT
1 series · 14 of 25 positions shown · non-contrast
Comparison: None

FINAL REPORT:
EXAM: US UPPER EXTREMITY VEINS RIGHT
INDICATION: diffuse right arm pain, recent hospital admission for acute chest syndrome, history of sickle cell disease

[Series 1: us upper extremity veins right · 14 of 28 slices shown]
[im 1/28]
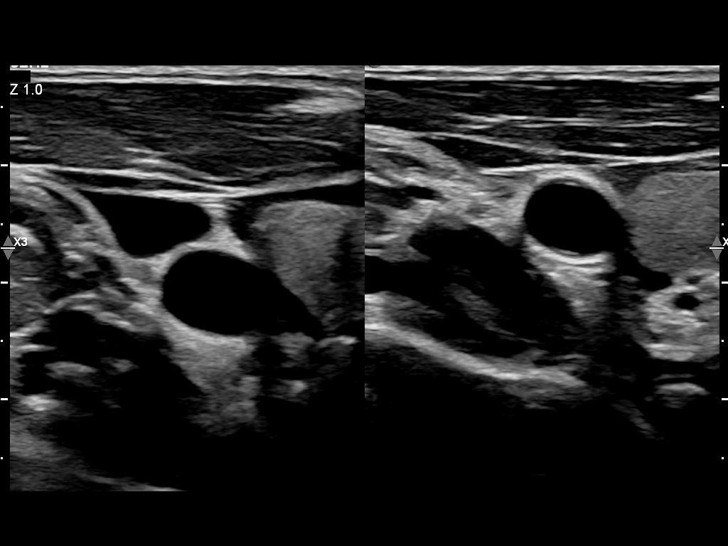
[im 3/28]
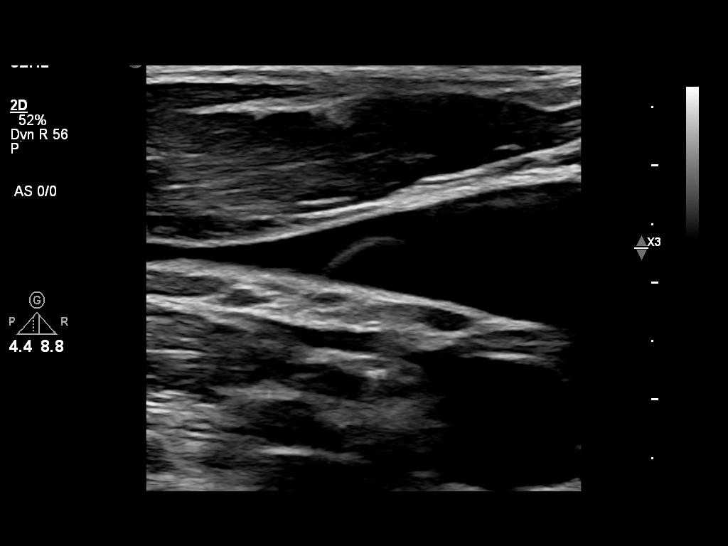
[im 5/28]
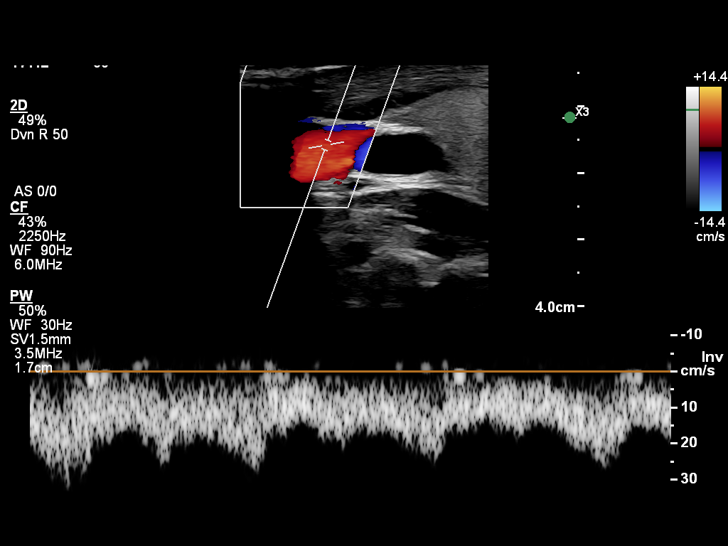
[im 7/28]
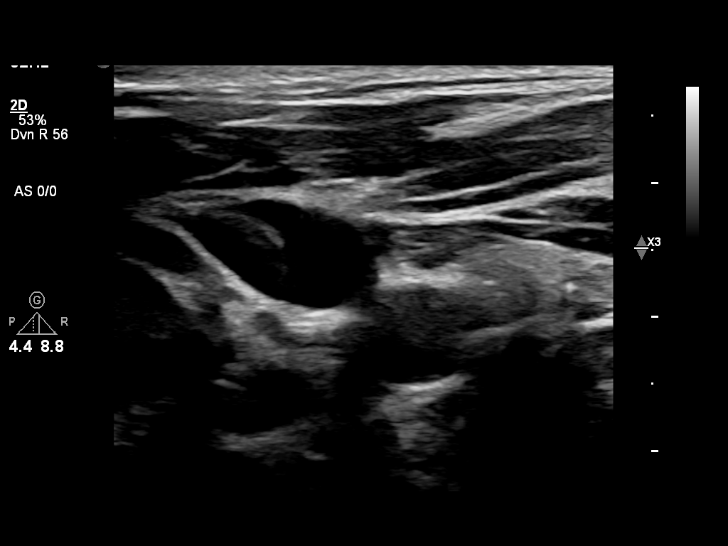
[im 10/28]
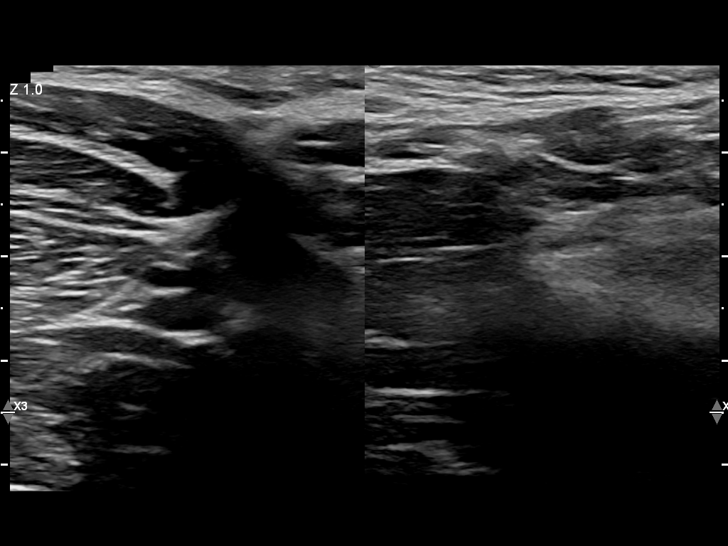
[im 11/28]
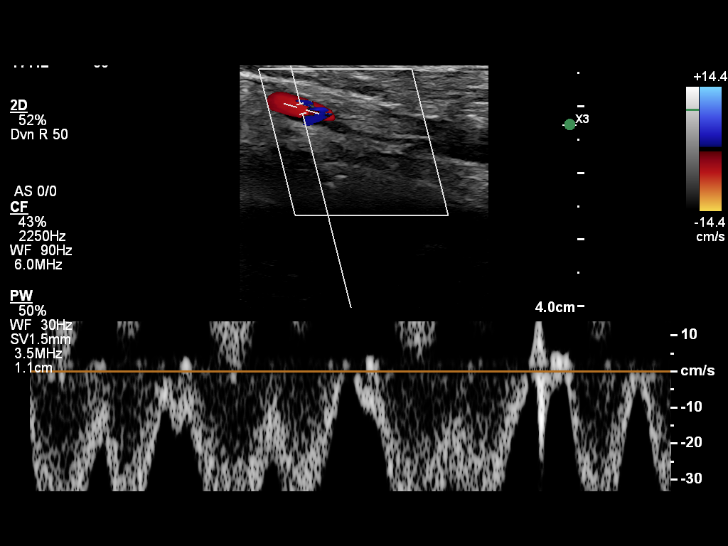
[im 13/28]
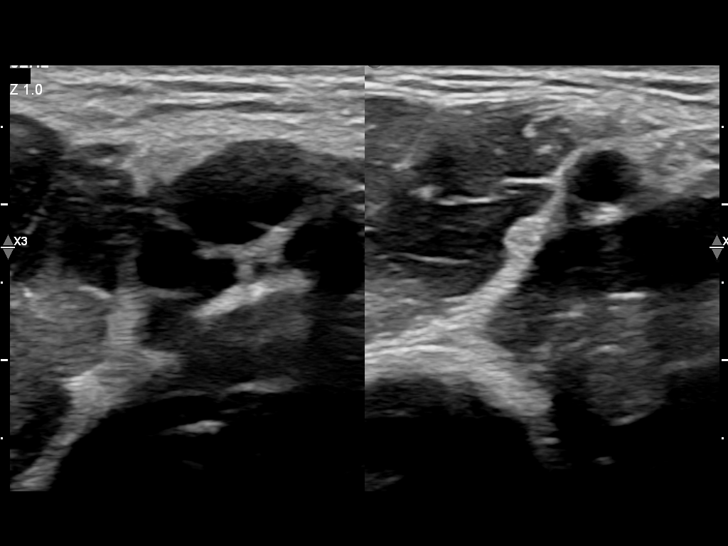
[im 15/28]
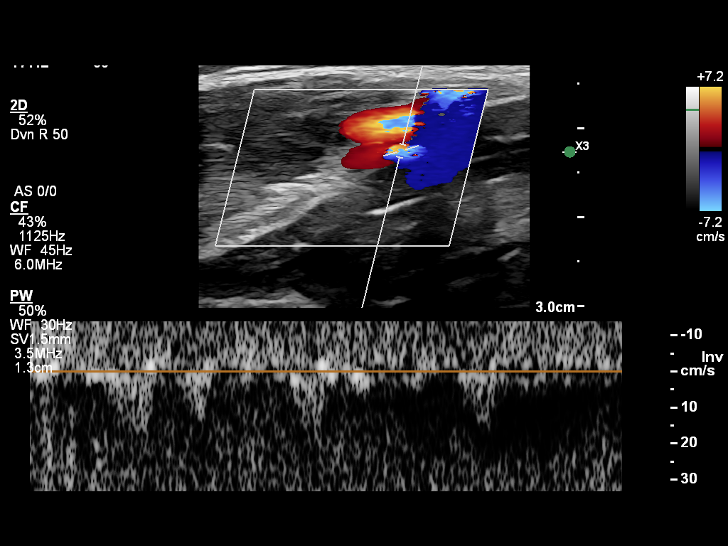
[im 17/28]
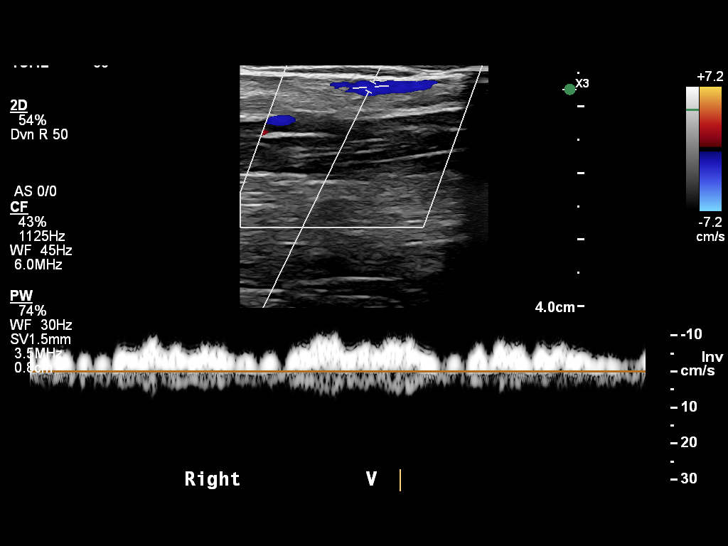
[im 19/28]
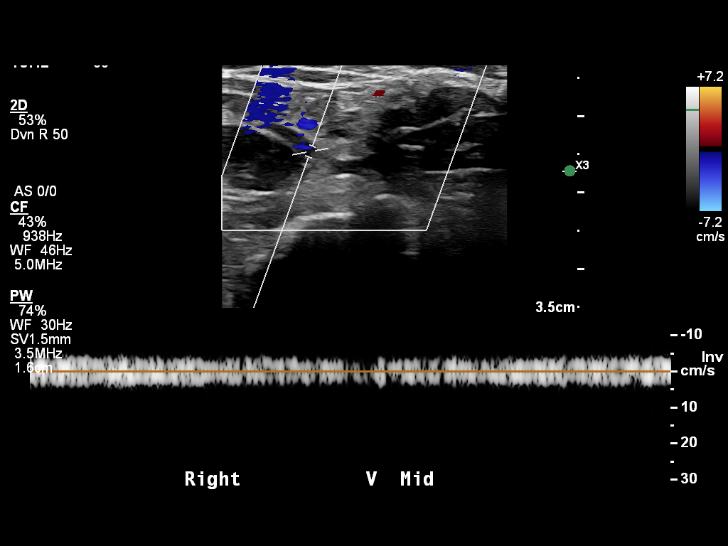
[im 21/28]
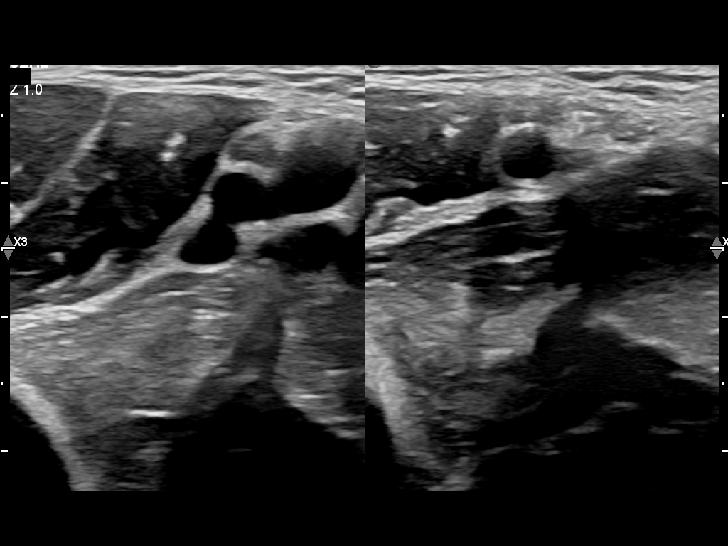
[im 23/28]
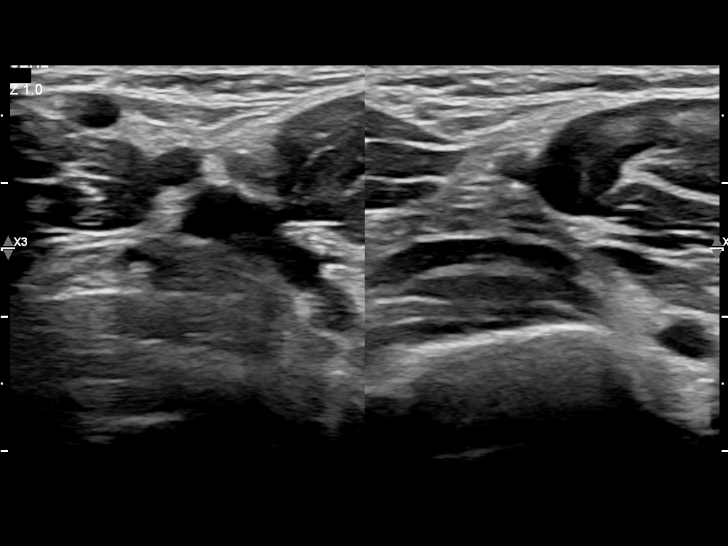
[im 25/28]
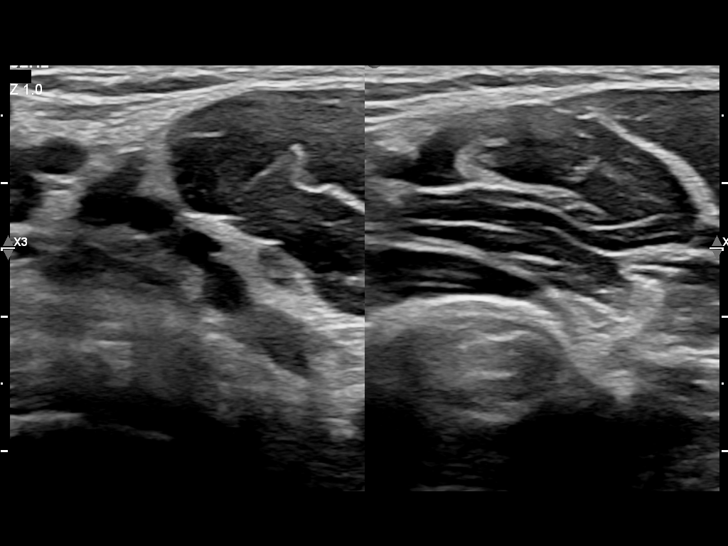
[im 28/28]
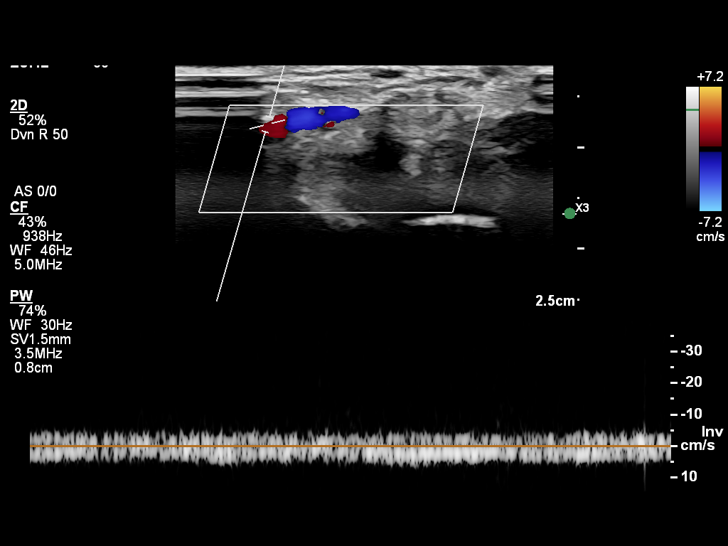

[14 of 25 positions shown; findings below may reference images not displayed]

FINDINGS: Right upper extremity venous system was assessed for color flow as well as compressibility and augmentation where possible. No DVT is identified. There is a mobile linear filling defect in the right IJ vein most likely representing a valve.
IMPRESSION: *  No evidence for DVT in the right upper extremity.
*  Mobile linear filling defect in the right IJ vein probably a valve. If patient had a venous catheter in this location a fibrin sheath could also be on the differential.

## 2023-03-09 IMAGING — DX XR ELBOW 2 VIEWS RIGHT
1 series · 3 of 3 positions shown · non-contrast
Comparison: None

FINAL REPORT:
EXAM: XR ELBOW 3+ VIEWS RIGHT
INDICATION: pain

[Series 31: AP · right · 0.10mm/px · 3 of 3 slices shown]
[im 1/3]
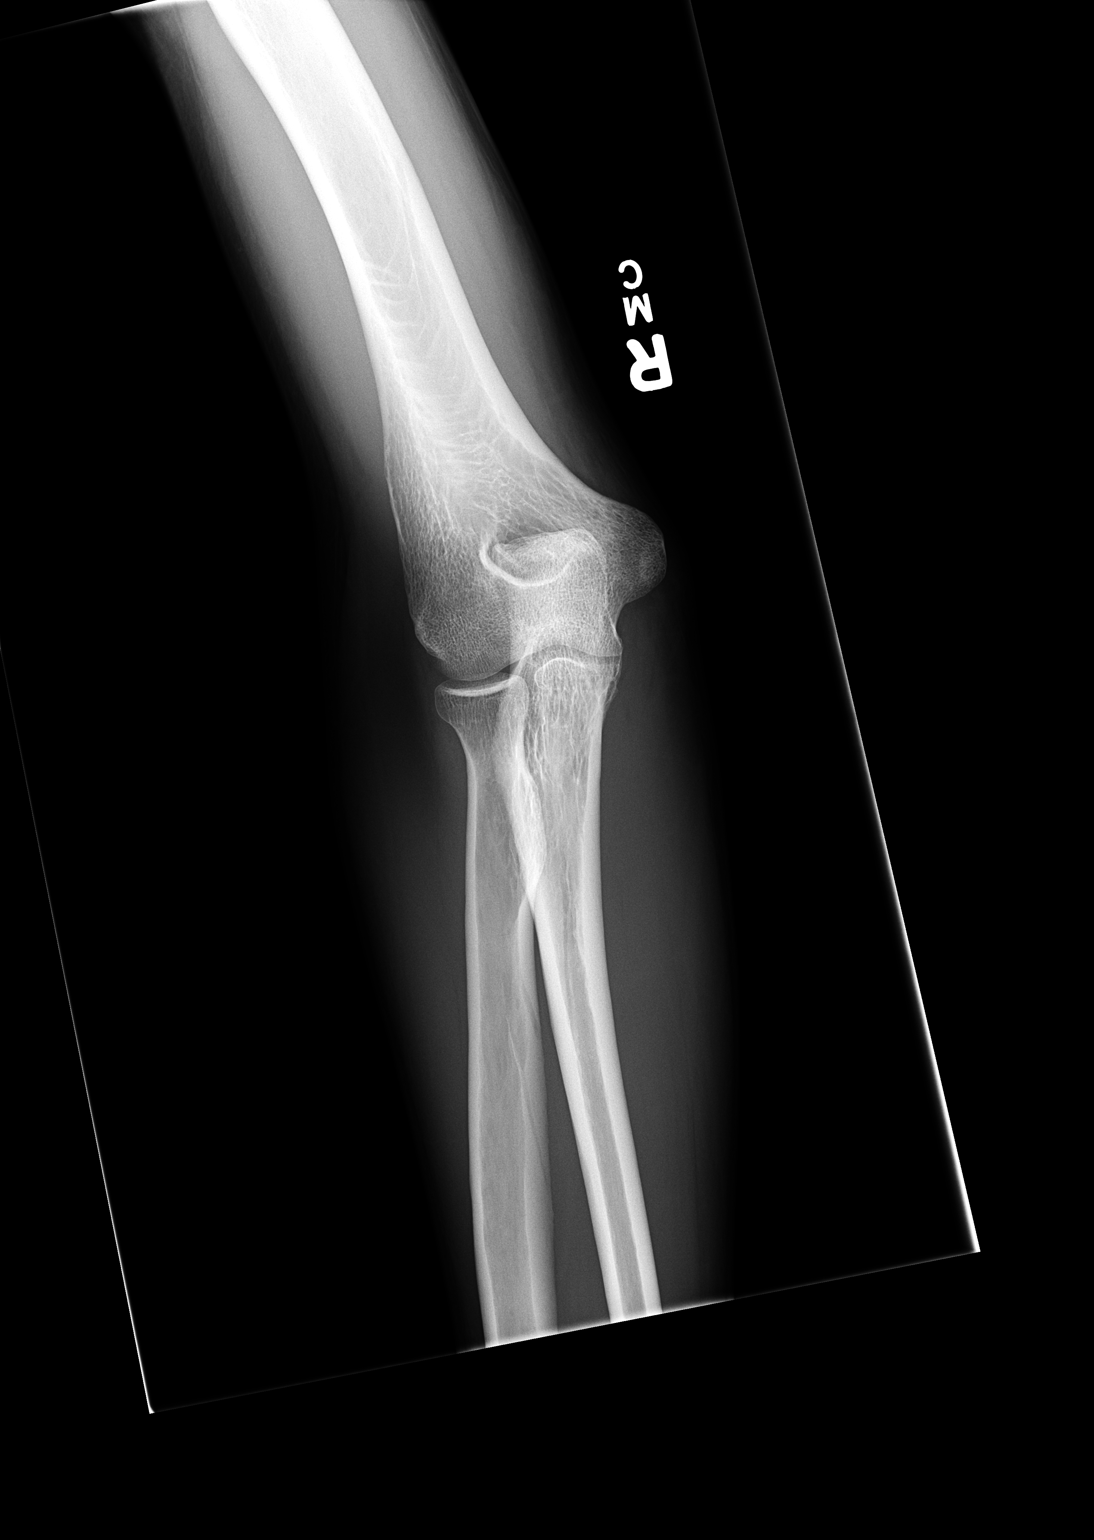
[im 2/3]
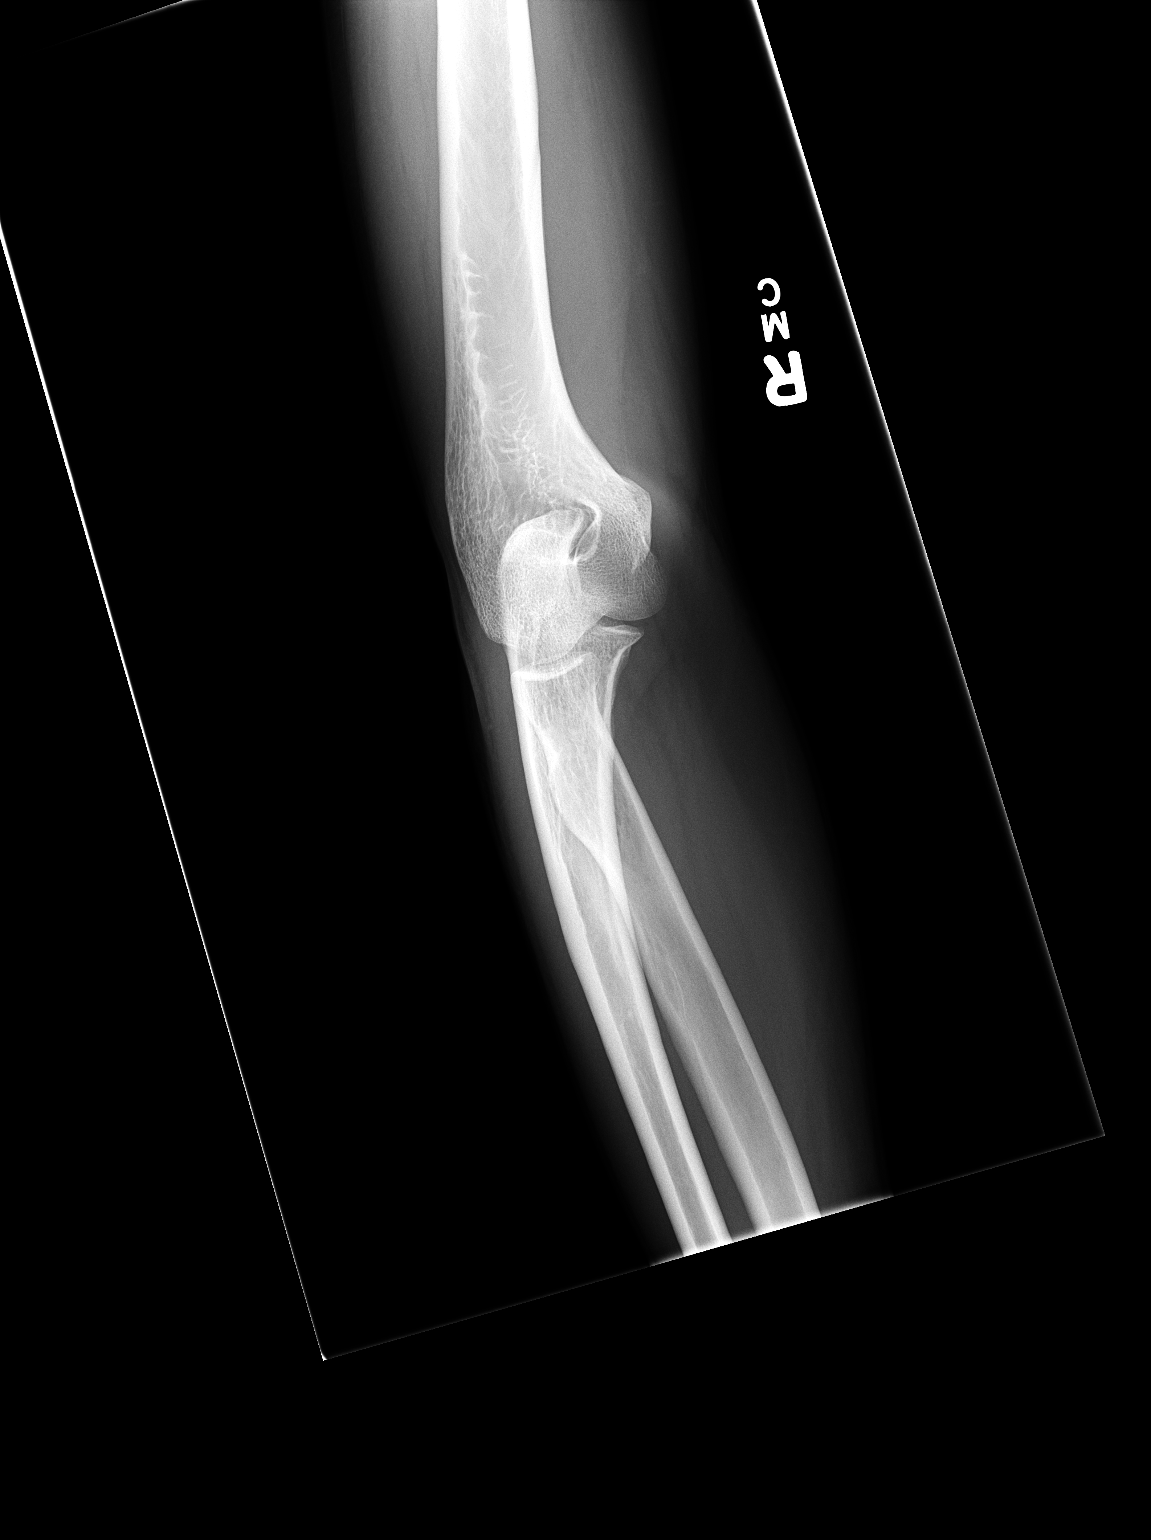
[im 3/3]
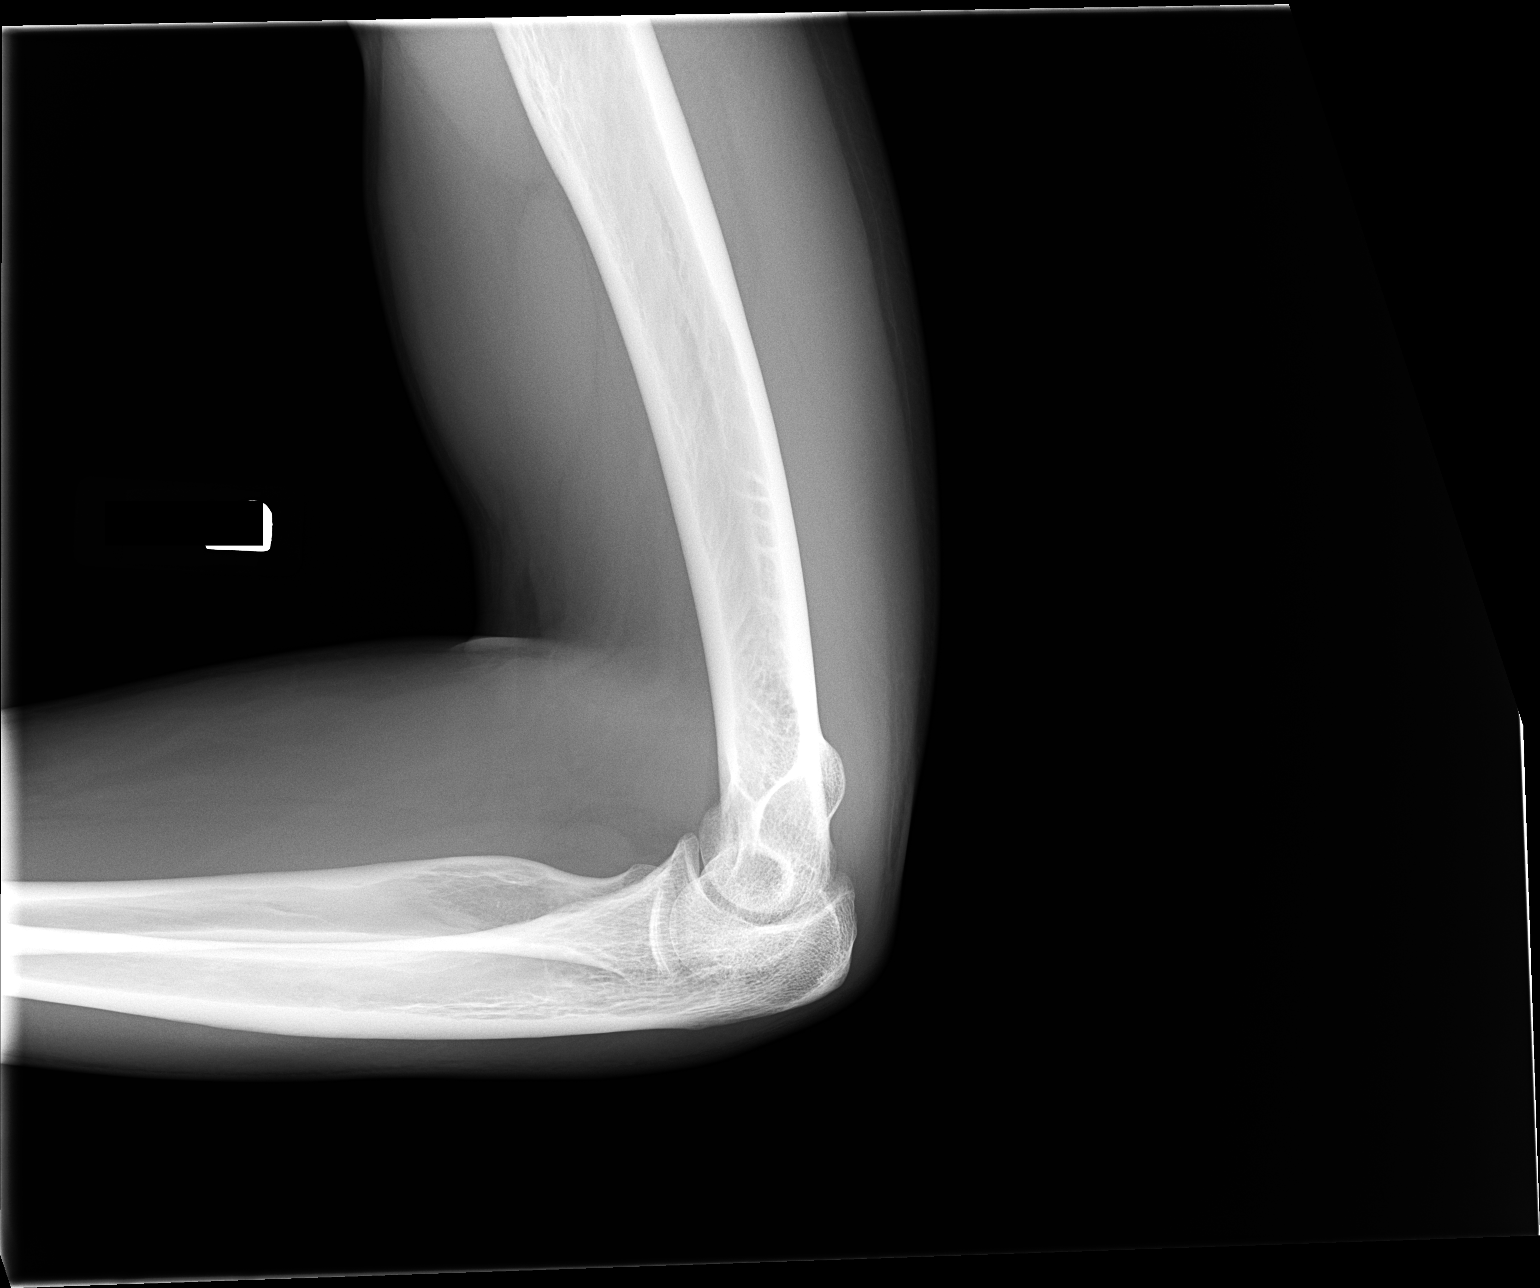

[3 of 3 positions shown; findings below may reference images not displayed]

FINDINGS: No acute fracture. Joint spaces appear maintained. No evidence for a joint effusion.
IMPRESSION: *  No acute findings.

## 2023-03-20 ENCOUNTER — Other Ambulatory Visit: Payer: Self-pay | Admitting: Primary Care

## 2023-03-20 NOTE — Telephone Encounter (Signed)
Patient Demographic Information [facility])    [facility] First Name Last Name Birth Date Gender Street Address [facility] Zip Code   [name] 20-Nov-1995 Male [address] [place] [address]     Search:  [facility] My Rx Current Rx Drug Type Rx Written Rx Dispensed Drug Quantity Days Supply Prescriber Name Prescriber [facility] # Payment Method Dispenser   [name] 03/08/2023 03/09/2023 oxycodone hcl (ir) 10 mg tab 84 14 Cameron Cellar MD [id number] Other Cameron [name]   [name] 02/23/2023 02/23/2023 oxycodone hcl (ir) 10 mg tab 84 14 Cameron Cellar MD [id number] Other Cameron [name]   [name] 02/07/2023 02/09/2023 oxycodone hcl (ir) 10 mg tab 84 14 Cameron [name] [id number] Other Cameron [name]   [name] 01/24/2023 01/26/2023 oxycodone hcl (ir) 10 mg tab 84 14 Cameron [name] [id number] Other Cameron [name]   [name] 01/10/2023 01/12/2023 oxycodone hcl (ir) 10 mg tab 84 14 Cameron Cellar MD [id number] Other Cameron Cameron [name] 12/26/2022 12/29/2022 oxycodone hcl (ir) 10 mg tab 84 14 Cameron Cellar MD [id number] Other Cameron Cameron [name] 12/14/2022 12/15/2022 oxycodone hcl (ir) 10 mg tab 84 14 Cameron Cellar MD [id number] Other Cameron Cameron [name] 11/30/2022 12/01/2022 oxycodone hcl (ir) 10 mg tab 84 14 Cameron Cellar MD [id number] Other Cameron Cameron [name] 11/15/2022 11/17/2022 oxycodone hcl (ir) 10 mg tab 84 14 Cameron Cellar MD [id number] Other Cameron Cameron [name] 11/03/2022 11/03/2022 oxycodone hcl (ir) 10 mg tab 84 14 Cameron Cellar MD [id number] Other Cameron Cameron [name] 10/19/2022 10/20/2022 oxycodone hcl (ir) 10 mg tab 84 14 Cameron Cellar MD [id number] Other Cameron Cameron [name] 10/03/2022 10/06/2022 oxycodone hcl (ir) 10 mg tab 84 14 [name] Other Cameron [name]    [name] 09/21/2022 09/22/2022 oxycodone hcl (ir) 10 mg tab 84 14 Cameron Cellar MD [id number] Other Cameron Cameron [name] 09/07/2022 09/08/2022 oxycodone hcl (ir) 10 mg tab 84 14 Cameron Cellar MD [id number] Other Cameron Cameron [name] 08/24/2022 08/25/2022 oxycodone hcl (ir) 10 mg tab 84 14 [name] Other Cameron [name]   [name] 08/11/2022 08/11/2022 oxycodone hcl (ir) 10 mg tab 84 14 Cameron [name] [id number] Other Cameron [name]

## 2023-03-23 ENCOUNTER — Other Ambulatory Visit: Payer: Self-pay

## 2023-03-23 MED ORDER — OXYCODONE HCL 10 MG PO TABS *I*
10.0000 mg | ORAL_TABLET | ORAL | 0 refills | Status: DC | PRN
Start: 2023-03-23 — End: 2023-04-04
  Filled 2023-03-23: qty 84, 14d supply, fill #0

## 2023-04-04 ENCOUNTER — Other Ambulatory Visit: Payer: Self-pay | Admitting: Student in an Organized Health Care Education/Training Program

## 2023-04-04 ENCOUNTER — Other Ambulatory Visit: Payer: Self-pay

## 2023-04-04 MED ORDER — OXYCODONE HCL 10 MG PO TABS *I*
10.0000 mg | ORAL_TABLET | ORAL | 0 refills | Status: DC | PRN
Start: 2023-04-04 — End: 2023-04-17
  Filled 2023-04-04: qty 84, 14d supply, fill #0

## 2023-04-04 NOTE — Telephone Encounter (Signed)
Patient Demographic Information [facility])    [facility] First Name Last Name Birth Date Gender Street Address [facility] Zip Code   [name] 02-07-1996 Male [address] [place] [address]   Search:  PDI My Rx Current Rx Drug Type Rx Written Rx Dispensed Drug Quantity Days Supply Prescriber Name Prescriber [facility] # Payment Method Dispenser   B [name] 03/23/2023 03/23/2023 oxycodone hcl (ir) 10 mg tab 84 14 [name] [id number] Other [facility]   B [name] 03/08/2023 03/09/2023 oxycodone hcl (ir) 10 mg tab 84 14 [name] MD [id number] Other [name] 02/23/2023 02/23/2023 oxycodone hcl (ir) 10 mg tab 84 14 [name] MD [id number] Other [facility]   [name] 02/07/2023 02/09/2023 oxycodone hcl (ir) 10 mg tab 84 14 [name] [id number] Other [facility]   A U [name] 01/24/2023 01/26/2023 oxycodone hcl (ir) 10 mg tab 84 14 [name] [id number] Other [facility]   A U [name] 01/10/2023 01/12/2023 oxycodone hcl (ir) 10 mg tab 84 14 [name] MD [id number] Other [facility]   A [name] 12/26/2022 12/29/2022 oxycodone hcl (ir) 10 mg tab 84 14 [name] MD [id number] Other [facility]   A [name] 12/14/2022 12/15/2022 oxycodone hcl (ir) 10 mg tab 84 14 [name] MD [id number] Other [facility]   A [name] 11/30/2022 12/01/2022 oxycodone hcl (ir) 10 mg tab 84 14 [name] MD [id number] Other [facility]   A [name] 11/15/2022 11/17/2022 oxycodone hcl (ir) 10 mg tab 84 14 [name] MD [id number] Other [facility]   A [name] 11/03/2022 11/03/2022 oxycodone hcl (ir) 10 mg tab 84 14 [name] MD [id number] Other [facility]   A [name] 10/19/2022 10/20/2022 oxycodone hcl (ir) 10 mg tab 84 14 [name] MD [id number] [facility]   A [name] 10/03/2022 10/06/2022 oxycodone hcl (ir) 10 mg tab 84 14 [name] [id number] Other [facility]   A U [name] 09/21/2022 09/22/2022 oxycodone hcl (ir) 10 mg tab 84 14 [name] MD [id number] Other [facility]   A [name] 09/07/2022 09/08/2022 oxycodone hcl (ir) 10 mg tab 84 14 [name] MD [id number] Other [facility]   A [name] 08/24/2022 08/25/2022 oxycodone hcl (ir) 10 mg tab 84 14 [name] [id number] Other [facility]   A U [name] 08/11/2022 08/11/2022 oxycodone hcl (ir) 10 mg tab 84 14 [name] [id number] Other [facility]   A U [name] 07/24/2022 07/28/2022 oxycodone hcl (ir) 10 mg tab [address] [name] [id number] Other [facility]   A U [name] 07/10/2022 07/14/2022 oxycodone hcl (ir) 10 mg tab 84 14 [name] MD [id number] Other [facility]   A [name] 06/29/2022 06/30/2022 oxycodone hcl (ir) 10 mg tab 84 14 [name] MD [id number] Other [facility]   A [name] 06/16/2022 06/16/2022 oxycodone  hcl (ir) 10 mg tab 84 14 [name] [id number] Other [facility]   A U [name] 06/01/2022 06/01/2022 oxycodone hcl (ir) 10 mg tab [address] [name] [id number] Other [facility]   A [name] 05/02/2022 05/04/2022 oxycodone-acetaminophen 10-325 mg tab 168 28 [name] MD [id number] Other [facility]   A [name] 04/05/2022 04/06/2022 oxycodone-acetaminophen 10-325 mg tab 168 28 [name], Heather,J, (MD) [id number] Other [facility]   Showing 1 to 24 of 24 entries  * - Details of Drug Type : O = Opioid, B = Benzodiazepine, S = Stimulant  * - Drugs marked with an asterisk are compound drugs. If the compound drug is made up of more than one controlled substance, then each controlled substance will be a separate row in the table.

## 2023-04-06 ENCOUNTER — Other Ambulatory Visit: Payer: Self-pay

## 2023-04-17 ENCOUNTER — Ambulatory Visit: Payer: Self-pay | Admitting: Optometry

## 2023-04-17 ENCOUNTER — Other Ambulatory Visit: Payer: Self-pay | Admitting: Primary Care

## 2023-04-17 ENCOUNTER — Other Ambulatory Visit: Payer: Self-pay

## 2023-04-18 ENCOUNTER — Other Ambulatory Visit: Payer: Self-pay

## 2023-04-18 MED ORDER — OXYCODONE HCL 10 MG PO TABS *I*
10.0000 mg | ORAL_TABLET | ORAL | 0 refills | Status: DC | PRN
Start: 2023-04-18 — End: 2023-05-04
  Filled 2023-04-18: qty 84, 14d supply, fill #0

## 2023-04-18 NOTE — Telephone Encounter (Signed)
Search Terms: Cameron [name], 04/08/1996  Search Date: 04/18/2023 07:59:04 AM  Searching on behalf of: [id number] - Cameron [name]  The Drug Utilization Report below displays all of the controlled substance prescriptions, if any, that your patient has filled in the last twelve months. The information displayed on this report is compiled from pharmacy submissions to the Department, and accurately reflects the information as submitted by the pharmacies.  This report was requested by: Cameron [name]  Reference #: [id number]  Practitioner Count: 3  Pharmacy Count: 1  Current Opioid Prescriptions: 1  Current Benzodiazepine Prescriptions: 0  Current Stimulant Prescriptions: 0        Patient Demographic Information [facility])       [facility] First Name Last Name Birth Date Gender Street Address [facility] Zip Code   [name] 05-29-1996 Male [address] [place] [address]   [name] [birth year] Male [address] [place] [address]        Search:  PDI My Rx Current Rx Drug Type Rx Written Rx Dispensed Drug Quantity Days Supply Prescriber Name Prescriber [facility] # Payment Method Dispenser   [name] 04/04/2023 04/06/2023 oxycodone hcl (ir) 10 mg tab 84 14 Cameron Cellar MD [id number] Other [name] 03/23/2023 03/23/2023 oxycodone hcl (ir) 10 mg tab 84 14 Cameron [name] [id number] Other [facility]   [name] 03/08/2023 03/09/2023 oxycodone hcl (ir) 10 mg tab 84 14 Cameron Cellar MD [id number] Other [name] 02/23/2023 02/23/2023 oxycodone hcl (ir) 10 mg tab 84 14 Cameron Cellar MD [id number] Other [facility]   [name] 02/07/2023 02/09/2023 oxycodone hcl (ir) 10 mg tab 84 14 Cameron [name] [id number] Other [facility]   [name] 01/24/2023 01/26/2023 oxycodone hcl (ir) 10 mg tab 84 14 Cameron [name] [id number] Other [facility]   [name] 01/10/2023 01/12/2023 oxycodone hcl  (ir) 10 mg tab 84 14 Cameron Cellar MD [id number] Other [facility]   [name] 12/26/2022 12/29/2022 oxycodone hcl (ir) 10 mg tab 84 14 Cameron Cellar MD [id number] Other [facility]   [name] 12/14/2022 12/15/2022 oxycodone hcl (ir) 10 mg tab 84 14 Cameron Cellar MD [id number] Other [facility]   [name] 11/30/2022 12/01/2022 oxycodone hcl (ir) 10 mg tab 84 14 Cameron Cellar MD [id number] Other [facility]   [name] 11/15/2022 11/17/2022 oxycodone hcl (ir) 10 mg tab 84 14 Cameron Cellar MD [id number] Other [facility]   [name] 11/03/2022 11/03/2022 oxycodone hcl (ir) 10 mg tab 84 14 Cameron Cellar MD [id number] Other [facility]   [name] 10/19/2022 10/20/2022 oxycodone hcl (ir) 10 mg tab 84 14 Cameron Cellar MD [id number] Other [facility]   [name] 10/03/2022 10/06/2022 oxycodone hcl (ir) 10 mg tab 84 14 [name] [id number] Other [facility]   [name] 09/21/2022 09/22/2022 oxycodone hcl (ir) 10 mg tab 84 14 Cameron Cellar MD [id number] Other [facility]   [name]  09/07/2022 09/08/2022 oxycodone hcl (ir) 10 mg tab 84 14 Cameron Cellar MD [id number] Other [facility]   [name] 08/24/2022 08/25/2022 oxycodone hcl (ir) 10 mg tab 84 14 [name] [id number] Other [facility]   [name] 08/11/2022 08/11/2022 oxycodone hcl (ir) 10 mg tab 84 14 Cameron [name] [id number] Other [facility]   [name] 07/24/2022 07/28/2022 oxycodone hcl (ir) 10 mg tab [address] [name] [id number] Other [facility]   [name] 07/10/2022 07/14/2022 oxycodone hcl (ir) 10 mg tab 84 14 Cameron Cellar MD [id number] Other [facility]   [name] 06/29/2022 06/30/2022 oxycodone hcl (ir) 10 mg tab [address] [name] MD [id number] Other [facility]   [name] 06/16/2022 06/16/2022 oxycodone hcl (ir) 10 mg tab 84 14 Cameron [name] [id number] Other [facility]   [name] 06/01/2022 06/01/2022 oxycodone hcl (ir) 10 mg tab [address] [name] [id number] Other [facility]   [name] 05/02/2022 05/04/2022 oxycodone-acetaminophen 10-325 mg tab 168 [address] MD [id number] Other [facility]

## 2023-04-20 ENCOUNTER — Other Ambulatory Visit: Payer: Self-pay

## 2023-04-23 ENCOUNTER — Ambulatory Visit: Payer: Self-pay | Admitting: Primary Care

## 2023-05-02 ENCOUNTER — Other Ambulatory Visit: Payer: Self-pay | Admitting: Primary Care

## 2023-05-02 NOTE — Telephone Encounter (Signed)
Search Terms: Cameron [name], [birth year]  Search Date: 05/02/2023 09:16:21 AM  Searching on behalf of: [id number] - Cameron [name]  The Drug Utilization Report below displays all of the controlled substance prescriptions, if any, that your patient has filled in the last twelve months. The information displayed on this report is compiled from pharmacy submissions to the Department, and accurately reflects the information as submitted by the pharmacies.  This report was requested by: Cameron [name]  Reference #: [id number]  Practitioner Count: 3  Pharmacy Count: 1  Current Opioid Prescriptions: 1  Current Benzodiazepine Prescriptions: 0  Current Stimulant Prescriptions: 0        Patient Demographic Information [facility])       [facility] First Name Last Name Birth Date Gender Street Address [facility] Zip Code   [name] [birth year] Male [address] [place] [address]   [name] May 16, 1996 Male [address] [place] [address]        Search:  PDI My Rx Current Rx Drug Type Rx Written Rx Dispensed Drug Quantity Days Supply Prescriber Name Prescriber [facility] # Payment Method Dispenser   [name] 04/18/2023 04/20/2023 oxycodone hcl (ir) 10 mg tab 84 14 Cameron Cellar MD [id number] Other [name]   Cameron [name] 04/04/2023 04/06/2023 oxycodone hcl (ir) 10 mg tab 84 14 Cameron Cellar MD [id number] Other [name] 03/23/2023 03/23/2023 oxycodone hcl (ir) 10 mg tab 84 14 [name] [id number] Other [name]   [name] 03/08/2023 03/09/2023 oxycodone hcl (ir) 10 mg tab 84 14 Cameron Cellar MD [id number] Other [name] 02/23/2023 02/23/2023 oxycodone hcl (ir) 10 mg tab 84 14 Cameron Cellar MD [id number] Other [name]   Cameron [name] 02/07/2023 02/09/2023 oxycodone hcl (ir) 10 mg tab 84 14 Cameron [name] [id number] Other [name]   [name] 01/24/2023 01/26/2023  oxycodone hcl (ir) 10 mg tab 84 14 [name] [id number] Other [name]   [name] 01/10/2023 01/12/2023 oxycodone hcl (ir) 10 mg tab 84 14 Cameron Cellar MD [id number] Other [name]   [name] 12/26/2022 12/29/2022 oxycodone hcl (ir) 10 mg tab 84 14 Cameron Cellar MD [id number] Other [name]   [name] 12/14/2022 12/15/2022 oxycodone hcl (ir) 10 mg tab 84 14 Cameron Cellar MD [id number] Other [name]   [name] 11/30/2022 12/01/2022 oxycodone hcl (ir) 10 mg tab 84 14 Cameron Cellar MD [id number] Other [name]   [name] 11/15/2022 11/17/2022 oxycodone hcl (ir) 10 mg tab 84 14 Cameron Cellar MD [id number] Other [name]   [name] 11/03/2022 11/03/2022 oxycodone hcl (ir) 10 mg tab 84 14 Cameron Cellar MD [id number] Other [name]   [name] 10/19/2022 10/20/2022 oxycodone hcl (ir) 10 mg tab 84 14 Cameron Cellar MD [id number] Other [name]   [name] 10/03/2022 10/06/2022 oxycodone hcl (ir) 10 mg tab 84 14 [name] [id number] Other [facility]   [name]  09/21/2022 09/22/2022 oxycodone hcl (ir) 10 mg tab 84 14 Cameron Cellar MD [id number] Other [name]   [name] 09/07/2022 09/08/2022 oxycodone hcl (ir) 10 mg tab [address] Cameron Cellar MD [id number] Other [name]   [name] 08/24/2022 08/25/2022 oxycodone hcl (ir) 10 mg tab 84 14 [name] [id number] Other [name]   [name] 08/11/2022 08/11/2022 oxycodone hcl (ir) 10 mg tab 84 14 [name] [id number] Other [name]   [name] 07/24/2022 07/28/2022 oxycodone hcl (ir) 10 mg tab [address] [name] [id number] Other [name]   [name] 07/10/2022 07/14/2022 oxycodone hcl (ir) 10 mg tab 84 14 Cameron Cellar MD [id number] Other  [name]   [name] 06/29/2022 06/30/2022 oxycodone hcl (ir) 10 mg tab [address] Cameron Cellar MD [id number] Other [name]   [name] 06/16/2022 06/16/2022 oxycodone hcl (ir) 10 mg tab 84 14 [name] [id number] Other [name]   [name] 06/01/2022 06/01/2022 oxycodone hcl (ir) 10 mg tab [address] [name] [id number] Other [name]   [name] 05/02/2022 05/04/2022 oxycodone-acetaminophen 10-325 mg tab 168 [address] MD [id number] Other [name]

## 2023-05-04 ENCOUNTER — Telehealth: Payer: Self-pay | Admitting: Internal Medicine

## 2023-05-04 ENCOUNTER — Other Ambulatory Visit: Payer: Self-pay

## 2023-05-04 MED ORDER — OXYCODONE HCL 10 MG PO TABS *I*
10.0000 mg | ORAL_TABLET | ORAL | 0 refills | Status: DC | PRN
Start: 2023-05-04 — End: 2023-05-16
  Filled 2023-05-04: qty 84, 14d supply, fill #0

## 2023-05-04 NOTE — Telephone Encounter (Signed)
Needs refill of oxycodone. ISTOP checked, he is due for refill today. Sent in to pharmacy.

## 2023-05-14 ENCOUNTER — Other Ambulatory Visit: Payer: Self-pay

## 2023-05-16 ENCOUNTER — Other Ambulatory Visit: Payer: Self-pay | Admitting: Internal Medicine

## 2023-05-16 ENCOUNTER — Other Ambulatory Visit: Payer: Self-pay

## 2023-05-16 NOTE — Telephone Encounter (Signed)
Search Terms: [name], September 27, 1995  Search Date: 05/16/2023 16:32:34 PM  Searching on behalf of: [id number] - [name]  The Drug Utilization Report below displays all of the controlled substance prescriptions, if any, that your patient has filled in the last twelve months. The information displayed on this report is compiled from pharmacy submissions to the Department, and accurately reflects the information as submitted by the pharmacies.  This report was requested by: [name]  Reference #: [id number]  Practitioner Count: 3  Pharmacy Count: 1  Current Opioid Prescriptions: 1  Current Benzodiazepine Prescriptions: 0  Current Stimulant Prescriptions: 0        Patient Demographic Information [facility])       [facility] First Name Last Name Birth Date Gender Street Address [facility] Zip Code   [name] 1995-10-21 Male [address] [place] [address]   [name] 08-30-1996 Male [address] [place] [address]        Search:  PDI My Rx Current Rx Drug Type Rx Written Rx Dispensed Drug Quantity Days Supply Prescriber Name Prescriber [facility] # Payment Method Dispenser   B [name] 05/04/2023 05/04/2023 oxycodone hcl (ir) 10 mg tab [address] Glean Hess [id number] Other [facility]   [name] 04/18/2023 04/20/2023 oxycodone hcl (ir) 10 mg tab 84 14 [name] MD [id number] Other [facility]   [name] 04/04/2023 04/06/2023 oxycodone hcl (ir) 10 mg tab 84 14 [name] MD [id number] Other [name] 03/23/2023 03/23/2023 oxycodone hcl (ir) 10 mg tab 84 14 [name] [id number] Other [facility]   B [name] 03/08/2023 03/09/2023 oxycodone hcl (ir) 10 mg tab 84 14 [name] MD [id number] Other [name] 02/23/2023 02/23/2023 oxycodone hcl (ir) 10 mg tab 84 14 [name] MD [id number] Other [facility]   [name] 02/07/2023 02/09/2023  oxycodone hcl (ir) 10 mg tab 84 14 [name] [id number] Other [facility]   A U [name] 01/24/2023 01/26/2023 oxycodone hcl (ir) 10 mg tab 84 14 [name] [id number] Other [facility]   A U [name] 01/10/2023 01/12/2023 oxycodone hcl (ir) 10 mg tab 84 14 [name] MD [id number] Other [facility]   A [name] 12/26/2022 12/29/2022 oxycodone hcl (ir) 10 mg tab 84 14 [name] MD [id number] Other [facility]   A [name] 12/14/2022 12/15/2022 oxycodone hcl (ir) 10 mg tab 84 14 [name] MD [id number] Other [facility]   A [name] 11/30/2022 12/01/2022 oxycodone hcl (ir) 10 mg tab 84 14 [name] MD [id number] Other [facility]   A [name] 11/15/2022 11/17/2022 oxycodone hcl (ir) 10 mg tab 84 14 [name] MD [id number] Other [facility]   A [name] 11/03/2022 11/03/2022 oxycodone hcl (ir) 10 mg tab 84 14 [name] MD [id number] Other [facility]   A [name] 10/19/2022 10/20/2022 oxycodone hcl (ir) 10 mg tab 84 14 [name] MD [id number] Other [facility]   A U N  O 10/03/2022 10/06/2022 oxycodone hcl (ir) 10 mg tab 84 14 [name] [id number] Other [facility]   A [name] 09/21/2022 09/22/2022 oxycodone hcl (ir) 10 mg tab 84 14 [name] MD [id number] Other [facility]   A [name] 09/07/2022 09/08/2022 oxycodone hcl (ir) 10 mg tab [address] [name] MD [id number] Other [facility]   A [name] 08/24/2022 08/25/2022 oxycodone hcl (ir) 10 mg tab 84 14 [name] [id number] Other [facility]   A [name] 08/11/2022 08/11/2022 oxycodone hcl (ir) 10 mg tab 84 14 [name] [id number] Other [facility]   A U [name] 07/24/2022 07/28/2022 oxycodone hcl (ir) 10 mg tab [address] [name] [id number] Other [facility]   A U [name] 07/10/2022 07/14/2022 oxycodone hcl (ir) 10 mg tab 84 14 [name] MD [id number] Other [facility]   A [name] 06/29/2022 06/30/2022 oxycodone hcl (ir) 10 mg tab [address] [name] MD [id number] Other [facility]   A [name] 06/16/2022 06/16/2022 oxycodone hcl (ir) 10 mg tab 84 14 [name] [id number] Other [facility]   A U [name] 06/01/2022 06/01/2022 oxycodone hcl (ir) 10 mg tab [address] [name] [id number] Other [facility]

## 2023-05-17 ENCOUNTER — Other Ambulatory Visit: Payer: Self-pay

## 2023-05-17 MED ORDER — OXYCODONE HCL 10 MG PO TABS *I*
10.0000 mg | ORAL_TABLET | ORAL | 0 refills | Status: DC | PRN
Start: 2023-05-17 — End: 2023-05-29
  Filled 2023-05-17: qty 84, 14d supply, fill #0

## 2023-05-18 ENCOUNTER — Other Ambulatory Visit: Payer: Self-pay

## 2023-05-29 ENCOUNTER — Other Ambulatory Visit: Payer: Self-pay

## 2023-05-29 ENCOUNTER — Other Ambulatory Visit: Payer: Self-pay | Admitting: Primary Care

## 2023-05-29 MED ORDER — OXYCODONE HCL 10 MG PO TABS *I*
10.0000 mg | ORAL_TABLET | ORAL | 0 refills | Status: DC | PRN
Start: 2023-05-29 — End: 2023-06-13
  Filled 2023-05-29: qty 84, 14d supply, fill #0

## 2023-05-29 NOTE — Telephone Encounter (Signed)
Search Terms: Alexio Sharpsteen, 1996-07-08  Search Date: 05/29/2023 16:28:04 PM  Searching on behalf of: [id number] - [name]  The Drug Utilization Report below displays all of the controlled substance prescriptions, if any, that your patient has filled in the last twelve months. The information displayed on this report is compiled from pharmacy submissions to the Department, and accurately reflects the information as submitted by the pharmacies.  This report was requested by: [name]  Reference #: [id number]  Practitioner Count: 3  Pharmacy Count: 1  Current Opioid Prescriptions: 1  Current Benzodiazepine Prescriptions: 0  Current Stimulant Prescriptions: 0        Patient Demographic Information [facility])       [facility] First Name Last Name Birth Date Gender Street Address [facility] Zip Code   Cameron [name] 24-Aug-1996 Male [address] [place] [address]   Cameron [name] 14-Jul-1996 Male [address] [place] [address]        Search:  PDI My Rx Current Rx Drug Type Rx Written Rx Dispensed Drug Quantity Days Supply Prescriber Name Prescriber [facility] # Payment Method Dispenser   Cameron [name] 05/17/2023 05/18/2023 oxycodone hcl (ir) 10 mg tab 84 14 [name] MD [id number] Other [name] 05/04/2023 05/04/2023 oxycodone hcl (ir) 10 mg tab [address] Glean Hess [id number] Other [facility]   Cameron U [name] 04/18/2023 04/20/2023 oxycodone hcl (ir) 10 mg tab 84 14 [name] MD [id number] Other [facility]   [name] 04/04/2023 04/06/2023 oxycodone hcl (ir) 10 mg tab 84 14 [name] MD [id number] Other [name] 03/23/2023 03/23/2023 oxycodone hcl (ir) 10 mg tab 84 14 [name] [id number] Other [facility]   Cameron [name] 03/08/2023 03/09/2023 oxycodone hcl (ir) 10 mg tab 84 14 [name] MD [id number] Other [name] 02/23/2023 02/23/2023  oxycodone hcl (ir) 10 mg tab 84 14 [name] MD [id number] Other [facility]   [name] 02/07/2023 02/09/2023 oxycodone hcl (ir) 10 mg tab 84 14 [name] [id number] Other [facility]   Cameron U [name] 01/24/2023 01/26/2023 oxycodone hcl (ir) 10 mg tab 84 14 [name] [id number] Other [facility]   Cameron U [name] 01/10/2023 01/12/2023 oxycodone hcl (ir) 10 mg tab 84 14 [name] MD [id number] Other [facility]   Cameron [name] 12/26/2022 12/29/2022 oxycodone hcl (ir) 10 mg tab 84 14 [name] MD [id number] Other [facility]   Cameron [name] 12/14/2022 12/15/2022 oxycodone hcl (ir) 10 mg tab 84 14 [name] MD [id number] Other [facility]   Cameron [name] 11/30/2022 12/01/2022 oxycodone hcl (ir) 10 mg tab 84 14 [name] MD [id number] Other [facility]   Cameron [name] 11/15/2022 11/17/2022 oxycodone hcl (ir) 10 mg tab 84 14 [name] MD [id number] Other [facility]   Cameron [name] 11/03/2022 11/03/2022 oxycodone hcl (ir) 10 mg tab 84 14 [name] MD [id number] Other [facility]   Cameron U N  O 10/19/2022 10/20/2022 oxycodone hcl (ir) 10 mg tab 84 14 [name] MD [id number] Other [facility]   Cameron [name] 10/03/2022 10/06/2022 oxycodone hcl (ir) 10 mg tab 84 14 [name] [id number] Other [facility]   Cameron [name] 09/21/2022 09/22/2022 oxycodone hcl (ir) 10 mg tab 84 14 [name] MD [id number] Other [facility]   Cameron [name] 09/07/2022 09/08/2022 oxycodone hcl (ir) 10 mg tab [address] [name] MD [id number] Other [facility]   Cameron [name] 08/24/2022 08/25/2022 oxycodone hcl (ir) 10 mg tab 84 14 [name] [id number] Other [facility]   Cameron [name] 08/11/2022 08/11/2022 oxycodone hcl (ir) 10 mg tab 84 14 [name] [id number] Other [facility]   Cameron U [name] 07/24/2022 07/28/2022 oxycodone hcl (ir) 10 mg tab [address] [name] [id number] Other [facility]   Cameron U [name] 07/10/2022 07/14/2022 oxycodone hcl (ir) 10 mg tab 84 14 [name] MD [id number] Other [facility]   Cameron [name] 06/29/2022 06/30/2022 oxycodone hcl (ir) 10 mg tab [address] [name] MD [id number] Other [facility]   Cameron [name] 06/16/2022 06/16/2022 oxycodone hcl (ir) 10 mg tab 84 14 [name] [id number] Other [facility]   Cameron U [name] 06/01/2022 06/01/2022 oxycodone hcl (ir) 10 mg tab [address] [name] [id number] Other [facility]

## 2023-06-01 ENCOUNTER — Other Ambulatory Visit: Payer: Self-pay

## 2023-06-13 ENCOUNTER — Other Ambulatory Visit: Payer: Self-pay | Admitting: Primary Care

## 2023-06-15 ENCOUNTER — Other Ambulatory Visit: Payer: Self-pay

## 2023-06-15 MED ORDER — OXYCODONE HCL 10 MG PO TABS *I*
10.0000 mg | ORAL_TABLET | ORAL | 0 refills | Status: DC | PRN
Start: 2023-06-15 — End: 2023-06-27
  Filled 2023-06-15: qty 84, 14d supply, fill #0

## 2023-06-27 ENCOUNTER — Other Ambulatory Visit: Payer: Self-pay | Admitting: Primary Care

## 2023-06-27 NOTE — Telephone Encounter (Signed)
Search Terms: Rylynn Schoneman, Apr 11, 1996  Search Date: 06/27/2023 15:22:34 PM  Searching on behalf of: [id number] - [name]  The Drug Utilization Report below displays all of the controlled substance prescriptions, if any, that your patient has filled in the last twelve months. The information displayed on this report is compiled from pharmacy submissions to the Department, and accurately reflects the information as submitted by the pharmacies.  This report was requested by: [name]  Reference #: [id number]  Practitioner Count: 3  Pharmacy Count: 1  Current Opioid Prescriptions: 1  Current Benzodiazepine Prescriptions: 0  Current Stimulant Prescriptions: 0        Patient Demographic Information [facility])       [facility] First Name Last Name Birth Date Gender Street Address [facility] Zip Code   A Nihaal Friesen [birth year] Male [address] [place] [address]   B Ollen Rao October 09, 1995 Male [address]  [place] [address]        Search:  PDI My Rx Current Rx Drug Type Rx Written Rx Dispensed Drug Quantity Days Supply Prescriber Name Prescriber [facility] # Payment Method Dispenser   B [name] 06/15/2023 06/15/2023 oxycodone hcl (ir) 10 mg tab 84 14 [name] MD [id number] Other [facility]   [name] 05/29/2023 06/01/2023 oxycodone hcl (ir) 10 mg tab [address] [name] [id number] Other [facility]   [name] 05/17/2023 05/18/2023 oxycodone hcl (ir) 10 mg tab 84 14 [name] MD [id number] Other [name] 05/04/2023 05/04/2023 oxycodone hcl (ir) 10 mg tab [address] Glean Hess [id number] Other [facility]   [name] 04/18/2023 04/20/2023 oxycodone hcl (ir) 10 mg tab 84 14 [name] MD [id number] Other [facility]   [name] 04/04/2023 04/06/2023 oxycodone hcl (ir) 10 mg tab 84 14 [name] MD [id number] Other [name] 03/23/2023  03/23/2023 oxycodone hcl (ir) 10 mg tab 84 14 [name] [id number] Other [facility]   B [name] 03/08/2023 03/09/2023 oxycodone hcl (ir) 10 mg tab 84 14 [name] MD [id number] Other [name] 02/23/2023 02/23/2023 oxycodone hcl (ir) 10 mg tab 84 14 [name] MD [id number] Other [facility]   [name] 02/07/2023 02/09/2023 oxycodone hcl (ir) 10 mg tab 84 14 [name] [id number] Other [facility]   A U [name] 01/24/2023 01/26/2023 oxycodone hcl (ir) 10 mg tab 84 14 [name] [id number] Other [facility]   A U [name] 01/10/2023 01/12/2023 oxycodone hcl (ir) 10 mg tab 84 14 [name] MD [id number] Other [facility]   A [name] 12/26/2022 12/29/2022 oxycodone hcl (ir) 10 mg tab 84 14 [name] MD [id number] Other [facility]   A [name] 12/14/2022 12/15/2022 oxycodone hcl (ir) 10 mg tab 84 14 [name] MD [id number] Other [facility]   A [name] 11/30/2022 12/01/2022 oxycodone hcl (ir) 10 mg tab 84 14 [name] MD [id number] Other [facility]   [name]  11/15/2022 11/17/2022 oxycodone hcl (ir) 10 mg tab 84 14 [name] MD [id number] Other [facility]   A [name] 11/03/2022 11/03/2022 oxycodone hcl (ir) 10 mg tab [address] [name] MD [id number] Other [facility]   A [name] 10/19/2022 10/20/2022 oxycodone hcl (ir) 10 mg tab [address] [name] MD [id number] Other [facility]   A [name] 10/03/2022 10/06/2022 oxycodone hcl (ir) 10 mg tab 84 14 [name] [id number] Other [facility]   A [name] 09/21/2022 09/22/2022 oxycodone hcl (ir) 10 mg tab 84 14 [name] MD [id number] Other [facility]   A [name] 09/07/2022 09/08/2022 oxycodone hcl (ir) 10 mg tab [address] [name] MD  [id number] Other [facility]   A [name] 08/24/2022 08/25/2022 oxycodone hcl (ir) 10 mg tab 84 14 [name] [id number] Other [facility]   A [name] 08/11/2022 08/11/2022 oxycodone hcl (ir) 10 mg tab 84 14 [name] [id number] Other [facility]   A [name] 07/24/2022 07/28/2022 oxycodone hcl (ir) 10 mg tab [address] [name] [id number] Other [facility]   A U [name] 07/10/2022 07/14/2022 oxycodone hcl (ir) 10 mg tab 84 14 [name] MD [id number] Other [facility]   A [name] 06/29/2022 06/30/2022 oxycodone hcl (ir) 10 mg tab [address] [name] MD [id number] Other [facility]

## 2023-06-28 ENCOUNTER — Other Ambulatory Visit: Payer: Self-pay

## 2023-06-28 MED ORDER — OXYCODONE HCL 10 MG PO TABS *I*
10.0000 mg | ORAL_TABLET | ORAL | 0 refills | Status: DC | PRN
Start: 2023-06-28 — End: 2023-07-11
  Filled 2023-06-28: qty 84, 14d supply, fill #0

## 2023-06-29 ENCOUNTER — Other Ambulatory Visit: Payer: Self-pay

## 2023-07-11 ENCOUNTER — Other Ambulatory Visit: Payer: Self-pay | Admitting: Primary Care

## 2023-07-11 NOTE — Telephone Encounter (Signed)
Search Terms: Harrel Ferrone, May 29, 1996  Search Date: 07/11/2023 12:17:41 PM  Searching on behalf of: [id number] - [name]  The Drug Utilization Report below displays all of the controlled substance prescriptions, if any, that your patient has filled in the last twelve months. The information displayed on this report is compiled from pharmacy submissions to the Department, and accurately reflects the information as submitted by the pharmacies.  This report was requested by: [name]  Reference #: [id number]  Practitioner Count: 3  Pharmacy Count: 1  Current Opioid Prescriptions: 1  Current Benzodiazepine Prescriptions: 0  Current Stimulant Prescriptions: 0        Patient Demographic Information [facility])       [facility] [facility] First Name Last Name Birth Date Gender Street Address [facility] Zip Code   [name] 1995/12/14 Male [address] [place] [address]   [name] 1995/10/19 Male [address] [place] [address]        Search:  [facility] My Rx Current Rx Drug Type Rx Written Rx Dispensed Drug Quantity Days Supply Prescriber Name Prescriber [facility] # Payment Method Dispenser   B [name] 06/28/2023 06/29/2023 oxycodone hcl (ir) 10 mg tab 84 14 [name] MD [id number] Other [name] 06/15/2023 06/15/2023 oxycodone hcl (ir) 10 mg tab 84 14 [name] MD [id number] Other [facility]   [name] 05/29/2023 06/01/2023 oxycodone hcl (ir) 10 mg tab [address] [name] [id number] Other [facility]   [name] 05/17/2023 05/18/2023 oxycodone hcl (ir) 10 mg tab 84 14 [name] MD [id number] Other [name] 05/04/2023 05/04/2023 oxycodone hcl (ir) 10 mg tab [address] Glean Hess [id number] Other [facility]   [name] 04/18/2023 04/20/2023 oxycodone hcl (ir) 10 mg tab 84 14 [name] MD [id number] Other [facility]   [name] 04/04/2023  04/06/2023 oxycodone hcl (ir) 10 mg tab 84 14 [name] MD [id number] Other [name] 03/23/2023 03/23/2023 oxycodone hcl (ir) 10 mg tab 84 14 [name] [id number] Other [facility]   B [name] 03/08/2023 03/09/2023 oxycodone hcl (ir) 10 mg tab 84 14 [name] MD [id number] Other [name] 02/23/2023 02/23/2023 oxycodone hcl (ir) 10 mg tab 84 14 [name] MD [id number] Other [name] 02/07/2023 02/09/2023 oxycodone hcl (ir) 10 mg tab 84 14 [name] [id number] Other [facility]   A U [name] 01/24/2023 01/26/2023 oxycodone hcl (ir) 10 mg tab 84 14 [name] [id number] Other [facility]   A U [name] 01/10/2023 01/12/2023 oxycodone hcl (ir) 10 mg tab 84 14 [name] MD [id number] Other [facility]   A [name] 12/26/2022 12/29/2022 oxycodone hcl (ir) 10 mg tab 84 14 [name] MD [id number] Other [facility]   A [name] 12/14/2022 12/15/2022 oxycodone hcl (ir) 10 mg tab 84 14 [name] MD [id number] Other [facility]   [name]  11/30/2022 12/01/2022 oxycodone hcl (ir) 10 mg tab 84 14 [name] MD [id number] Other [facility]   A [name] 11/15/2022 11/17/2022 oxycodone hcl (ir) 10 mg tab [address] [name] MD [id number] Other [facility]   A [name] 11/03/2022 11/03/2022 oxycodone hcl (ir) 10 mg tab [address] [name] MD [id number] Other [facility]   A [name] 10/19/2022 10/20/2022 oxycodone hcl (ir) 10 mg tab [address] [name] MD [id number] Other [facility]   A [name] 10/03/2022 10/06/2022 oxycodone hcl (ir) 10 mg tab 84 14 [name] [id number] Other [facility]   A [name] 09/21/2022 09/22/2022 oxycodone hcl (ir) 10 mg tab 84 14 [name] MD  [id number] Other [facility]   A [name] 09/07/2022 09/08/2022 oxycodone hcl (ir) 10 mg tab [address] [name] MD [id number] Other [facility]   A [name] 08/24/2022 08/25/2022 oxycodone hcl (ir) 10 mg tab 84 14 [name] [id number] Other [facility]   A [name] 08/11/2022 08/11/2022 oxycodone hcl (ir) 10 mg tab 84 14 [name] [id number] Other [facility]   A [name] 07/24/2022 07/28/2022 oxycodone hcl (ir) 10 mg tab [address] [name] [id number] Other [facility]   A U [name] 07/10/2022 07/14/2022 oxycodone hcl (ir) 10 mg tab 84 14 [name] MD [id number] Other [facility]

## 2023-07-12 ENCOUNTER — Other Ambulatory Visit: Payer: Self-pay

## 2023-07-12 MED ORDER — OXYCODONE HCL 10 MG PO TABS *I*
10.0000 mg | ORAL_TABLET | ORAL | 0 refills | Status: DC | PRN
Start: 2023-07-12 — End: 2023-07-24
  Filled 2023-07-12: qty 84, 14d supply, fill #0

## 2023-07-13 ENCOUNTER — Other Ambulatory Visit: Payer: Self-pay

## 2023-07-24 ENCOUNTER — Other Ambulatory Visit: Payer: Self-pay | Admitting: Primary Care

## 2023-07-25 NOTE — Telephone Encounter (Signed)
Patient Demographic Information [facility])  [facility] First Name Last Name Birth Date Gender Street Address [facility] Zip Code   [name] April 13, 1996 Male [address] [place] [address]   Search:  PDI My Rx Current Rx Drug Type Rx Written Rx Dispensed Drug Quantity Days Supply Prescriber Name Prescriber [facility] # Payment Method Dispenser   B [name] 07/12/2023 07/13/2023 oxycodone hcl (ir) 10 mg tab 84 14 [name] MD [id number] Other [name] 06/28/2023 06/29/2023 oxycodone hcl (ir) 10 mg tab 84 14 [name] MD [id number] Other [name] 06/15/2023 06/15/2023 oxycodone hcl (ir) 10 mg tab 84 14 [name] MD [id number] Other [name] 05/29/2023 06/01/2023 oxycodone hcl (ir) 10 mg tab [address] [name] [id number] Other [facility]   [name] 05/17/2023 05/18/2023 oxycodone hcl (ir) 10 mg tab 84 14 [name] MD [id number] Other [name] 05/04/2023 05/04/2023 oxycodone hcl (ir) 10 mg tab [address] Glean Hess [id number] Other [facility]   [name] 04/18/2023 04/20/2023 oxycodone hcl (ir) 10 mg tab 84 14 [name] MD [id number] Other [facility]   [name] 04/04/2023 04/06/2023 oxycodone hcl (ir) 10 mg tab 84 14 [name] MD [id number] Other [name] 03/23/2023 03/23/2023 oxycodone hcl (ir) 10 mg tab 84 14 [name] [id number] Other [facility]   B [name] 03/08/2023 03/09/2023 oxycodone hcl (ir) 10 mg tab 84 14 [name] MD [id number] Other [name] 02/23/2023 02/23/2023 oxycodone hcl (ir) 10 mg tab 84 14 [name] MD [id number] Other [facility]   [name] 02/07/2023 02/09/2023 oxycodone hcl (ir) 10 mg tab 84 14 [name] [id number] Other [facility]   A U [name] 01/24/2023 01/26/2023 oxycodone hcl (ir) 10 mg tab 84 14 [name] [id number] Other [facility]   A U [name] 01/10/2023 01/12/2023 oxycodone hcl (ir) 10 mg tab 84 14 [name] MD [id number] Other [facility]   A [name] 12/26/2022 12/29/2022 oxycodone hcl (ir) 10 mg tab 84 14 [name] MD [id number] Other [facility]   A [name] 12/14/2022 12/15/2022 oxycodone hcl (ir) 10 mg tab 84 14 [name] MD [id number] Other [facility]   A [name] 11/30/2022 12/01/2022 oxycodone hcl (ir) 10 mg tab 84 14 [name] MD [id number] Other [facility]   A [name] 11/15/2022 11/17/2022 oxycodone hcl (ir) 10 mg tab 84 14 [name] MD [id number] Other [facility]   A [name] 11/03/2022 11/03/2022 oxycodone hcl (ir) 10 mg tab 84 14 [name] MD [id number] Other [facility]   A [name] 10/19/2022 10/20/2022 oxycodone hcl (ir) 10 mg tab 84 14 [name] MD [id number] Other [facility]   [name]  10/03/2022 10/06/2022 oxycodone hcl (ir) 10 mg tab 84 14 [name] [id number] Other [facility]   A [name] 09/21/2022 09/22/2022 oxycodone hcl (ir) 10 mg tab 84 14 [name] MD [id number] Other [facility]   A [name] 09/07/2022 09/08/2022 oxycodone hcl (ir) 10 mg tab [address] [name] MD [id number] Other [facility]   A [name] 08/24/2022 08/25/2022 oxycodone hcl (ir) 10 mg tab 84 14 [name] [id number] Other [facility]   A [name] 08/11/2022 08/11/2022 oxycodone hcl (ir) 10 mg tab 84 14 [name] [id number] Other [facility]

## 2023-07-26 ENCOUNTER — Other Ambulatory Visit: Payer: Self-pay

## 2023-07-26 MED ORDER — OXYCODONE HCL 10 MG PO TABS *I*
10.0000 mg | ORAL_TABLET | ORAL | 0 refills | Status: DC | PRN
Start: 2023-07-26 — End: 2023-08-08
  Filled 2023-07-26: qty 84, 14d supply, fill #0

## 2023-07-27 ENCOUNTER — Other Ambulatory Visit: Payer: Self-pay

## 2023-08-08 ENCOUNTER — Other Ambulatory Visit: Payer: Self-pay | Admitting: Primary Care

## 2023-08-08 NOTE — Telephone Encounter (Signed)
Search Terms: Macintyre Luevanos, 1996-01-20  Search Date: 08/08/2023 08:17:56 AM  Searching on behalf of: [id number] - [name]  The Drug Utilization Report below displays all of the controlled substance prescriptions, if any, that your patient has filled in the last twelve months. The information displayed on this report is compiled from pharmacy submissions to the Department, and accurately reflects the information as submitted by the pharmacies.  This report was requested by: [name]  Reference #: [id number]  Practitioner Count: 2  Pharmacy Count: 1  Current Opioid Prescriptions: 1  Current Benzodiazepine Prescriptions: 0  Current Stimulant Prescriptions: 0        Patient Demographic Information [facility])       [facility] First Name Last Name Birth Date Gender Street Address [facility] Zip Code   A Zakhari Michniewicz August 23, 1996 Male [address] [place] [address]   B Braydon Wisham [birth year] Male [address] [place] [address]        Search:  PDI My Rx Current Rx Drug Type Rx Written Rx Dispensed Drug Quantity Days Supply Prescriber Name Prescriber [facility] # Payment Method Dispenser   A [name] 07/26/2023 07/27/2023 oxycodone hcl (ir) 10 mg tab 84 14 [name] MD [id number] Other [facility]   A [name] 07/12/2023 07/13/2023 oxycodone hcl (ir) 10 mg tab 84 14 [name] MD [id number] Other [facility]   A [name] 06/28/2023 06/29/2023 oxycodone hcl (ir) 10 mg tab 84 14 [name] MD [id number] Other [facility]   A [name] 06/15/2023 06/15/2023 oxycodone hcl (ir) 10 mg tab 84 14 [name] MD [id number] Other [facility]   A [name] 05/29/2023 06/01/2023 oxycodone hcl (ir) 10 mg tab [address] [name] [id number] Other [facility]   A [name] 05/17/2023 05/18/2023 oxycodone hcl (ir) 10 mg tab 84 14 [name] MD [name] Other [facility]   A [name] 05/04/2023  05/04/2023 oxycodone hcl (ir) 10 mg tab [address] Glean Hess [id number] Other [facility]   A U [name] 04/18/2023 04/20/2023 oxycodone hcl (ir) 10 mg tab 84 14 [name] MD [id number] Other [facility]   A [name] 04/04/2023 04/06/2023 oxycodone hcl (ir) 10 mg tab 84 14 [name] MD [id number] Other [facility]   A [name] 03/23/2023 03/23/2023 oxycodone hcl (ir) 10 mg tab 84 14 [name] [id number] Other [facility]   A U [name] 03/08/2023 03/09/2023 oxycodone hcl (ir) 10 mg tab 84 14 [name] MD [id number] Other [facility]   A [name] 02/23/2023 02/23/2023 oxycodone hcl (ir) 10 mg tab 84 14 [name] MD [id number] Other [facility]   A [name] 02/07/2023 02/09/2023 oxycodone hcl (ir) 10 mg tab 84 14 [name] [id number] Other [facility]   B [name] 01/24/2023 01/26/2023 oxycodone hcl (ir) 10 mg tab 84 14 [name] [id number] Other [facility]   [name] 01/10/2023 01/12/2023 oxycodone hcl (ir) 10 mg tab 84 14 [name] MD [id number] Other [facility]   B [name]  12/26/2022 12/29/2022 oxycodone hcl (ir) 10 mg tab [address] [name] MD [id number] Other [name] 12/14/2022 12/15/2022 oxycodone hcl (ir) 10 mg tab [address] [name] MD [id number] Other [name] 11/30/2022 12/01/2022 oxycodone hcl (ir) 10 mg tab [address] [name] MD [id number] Other [name] 11/15/2022 11/17/2022 oxycodone hcl (ir) 10 mg tab [address] [name] MD [id number] Other [name] 11/03/2022 11/03/2022 oxycodone hcl (ir) 10 mg tab [address] [name] MD [id number] Other [name] 10/19/2022 10/20/2022 oxycodone hcl (ir) 10 mg tab [address] [name] MD  [id number] Other [name] 10/03/2022 10/06/2022 oxycodone hcl (ir) 10 mg tab 84 14 [name] [id number] Other [facility]   B [name] 09/21/2022 09/22/2022 oxycodone hcl (ir) 10 mg tab 84 14 [name] MD [id number] Other [name] 09/07/2022 09/08/2022 oxycodone hcl (ir) 10 mg tab [address] [name] MD [id number] Other [name] 08/24/2022 08/25/2022 oxycodone hcl (ir) 10 mg tab 84 14 [name] [id number] Other [facility]   B [name] 08/11/2022 08/11/2022 oxycodone hcl (ir) 10 mg tab 84 14 [name] [id number] Other [facility]

## 2023-08-09 ENCOUNTER — Other Ambulatory Visit: Payer: Self-pay

## 2023-08-09 MED ORDER — OXYCODONE HCL 10 MG PO TABS *I*
10.0000 mg | ORAL_TABLET | ORAL | 0 refills | Status: DC | PRN
Start: 2023-08-09 — End: 2023-08-21
  Filled 2023-08-09: qty 84, 14d supply, fill #0

## 2023-08-10 ENCOUNTER — Other Ambulatory Visit: Payer: Self-pay

## 2023-08-21 ENCOUNTER — Other Ambulatory Visit: Payer: Self-pay

## 2023-08-21 ENCOUNTER — Other Ambulatory Visit: Payer: Self-pay | Admitting: Primary Care

## 2023-08-21 MED ORDER — OXYCODONE HCL 10 MG PO TABS *I*
10.0000 mg | ORAL_TABLET | ORAL | 0 refills | Status: DC | PRN
Start: 2023-08-21 — End: 2023-09-05
  Filled 2023-08-21: qty 84, 14d supply, fill #0

## 2023-08-21 NOTE — Telephone Encounter (Signed)
Patient Demographic Information [facility])       [facility] First Name Last Name Birth Date Gender Street Address [facility] Zip Code   A Jhordy Fazzino 1996-06-02 Male [address] [place] [address]   B Sears Haag 10/04/1995 Male [address] [place] [address]        Search:  PDI My Rx Current Rx Drug Type Rx Written Rx Dispensed Drug Quantity Days Supply Prescriber Name Prescriber [facility] # Payment Method Dispenser   A [name] 08/09/2023 08/10/2023 oxycodone hcl (ir) 10 mg tab [address] [name] [id number] Other [facility]   A [name] 07/26/2023 07/27/2023 oxycodone hcl (ir) 10 mg tab 84 14 [name] MD [id number] Other [facility]   A [name] 07/12/2023 07/13/2023 oxycodone hcl (ir) 10 mg tab 84 14 [name] MD [id number] Other [facility]   A [name] 06/28/2023 06/29/2023 oxycodone hcl (ir) 10 mg tab 84 14 [name] MD [id number] Other [facility]   A [name] 06/15/2023 06/15/2023 oxycodone hcl (ir) 10 mg tab 84 14 [name] MD [id number] Other [facility]   A [name] 05/29/2023 06/01/2023 oxycodone hcl (ir) 10 mg tab [address] [name] [id number] Other [facility]   A [name] 05/17/2023 05/18/2023 oxycodone hcl (ir) 10 mg tab 84 14 [name] MD [id number] Other [facility]   A [name] 05/04/2023 05/04/2023 oxycodone hcl (ir) 10 mg tab [address] Glean Hess [id number] Other [facility]   A U [name] 04/18/2023 04/20/2023 oxycodone hcl (ir) 10 mg tab 84 14 [name] MD [id number] Other [facility]   A [name] 04/04/2023 04/06/2023 oxycodone hcl (ir) 10 mg tab 84 14 [name] MD [id number] Other [facility]   A [name] 03/23/2023 03/23/2023 oxycodone hcl (ir) 10 mg tab 84 14 [name] [id number] Other [facility]   A U [name] 03/08/2023 03/09/2023 oxycodone hcl  (ir) 10 mg tab 84 14 [name] MD [id number] Other [facility]   A [name] 02/23/2023 02/23/2023 oxycodone hcl (ir) 10 mg tab 84 14 [name] MD [id number] Other [facility]   A [name] 02/07/2023 02/09/2023 oxycodone hcl (ir) 10 mg tab 84 14 [name] [id number] [facility]   B [name] 01/24/2023 01/26/2023 oxycodone hcl (ir) 10 mg tab 84 14 [name] [id number] Other [facility]   [name] 01/10/2023 01/12/2023 oxycodone hcl (ir) 10 mg tab 84 14 [name] MD [id number] Other [name] 12/26/2022 12/29/2022 oxycodone hcl (ir) 10 mg tab 84 14 [name] MD [id number] Other [name] 12/14/2022 12/15/2022 oxycodone hcl (ir) 10 mg tab 84 14 [name] MD [id number] Other [name] 11/30/2022 12/01/2022 oxycodone hcl (ir) 10 mg tab 84 14 [name] MD [id number] Other [name] 11/15/2022 11/17/2022 oxycodone hcl (ir)  10 mg tab 84 14 [name] MD [id number] Other [name] 11/03/2022 11/03/2022 oxycodone hcl (ir) 10 mg tab [address] [name] MD [id number] Other [name] 10/19/2022 10/20/2022 oxycodone hcl (ir) 10 mg tab [address] [name] MD [id number] Other [name] 10/03/2022 10/06/2022 oxycodone hcl (ir) 10 mg tab 84 14 [name] [id number] Other [facility]   B [name] 09/21/2022 09/22/2022 oxycodone hcl (ir) 10 mg tab 84 14 [name] MD [id number] Other [name] 09/07/2022 09/08/2022 oxycodone hcl (ir) 10 mg tab 84 14 [name] MD [id number] Other [name] 08/24/2022 08/25/2022 oxycodone hcl (ir) 10 mg tab 84 14 [name] [id number] Other [facility]     Showing 1 to 26 of 26 entries     * - Details of Drug Type : O = Opioid, B = Benzodiazepine, S = Stimulant  * - Drugs marked with an asterisk are compound drugs. If the compound drug is made up of more than one controlled substance, then each controlled substance will be a separate row in the table.

## 2023-08-24 ENCOUNTER — Other Ambulatory Visit: Payer: Self-pay

## 2023-09-02 ENCOUNTER — Encounter: Payer: Self-pay | Admitting: Primary Care

## 2023-09-05 ENCOUNTER — Other Ambulatory Visit: Payer: Self-pay

## 2023-09-05 ENCOUNTER — Ambulatory Visit: Payer: Self-pay | Admitting: Primary Care

## 2023-09-05 ENCOUNTER — Other Ambulatory Visit: Payer: Self-pay | Admitting: Primary Care

## 2023-09-05 MED ORDER — OXYCODONE HCL 10 MG PO TABS *I*
10.0000 mg | ORAL_TABLET | ORAL | 0 refills | Status: DC | PRN
Start: 2023-09-05 — End: 2023-09-18
  Filled 2023-09-05: qty 84, 14d supply, fill #0

## 2023-09-05 NOTE — Telephone Encounter (Signed)
Search Terms: Anthoy Mcelhannon, May 11, 1996  Search Date: 09/05/2023 11:01:47 AM  Searching on behalf of: [id number] - [name]  The Drug Utilization Report below displays all of the controlled substance prescriptions, if any, that your patient has filled in the last twelve months. The information displayed on this report is compiled from pharmacy submissions to the Department, and accurately reflects the information as submitted by the pharmacies.  This report was requested by: [name]  Reference #: [id number]  Practitioner Count: 2  Pharmacy Count: 1  Current Opioid Prescriptions: 1  Current Benzodiazepine Prescriptions: 0  Current Stimulant Prescriptions: 0        Patient Demographic Information [facility])       [facility] First Name Last Name Birth Date Gender Street Address [facility] Zip Code   [name] 05/03/1996 Male [address] [place] [address]   [name] 03-14-1996 Male [address] [place] [address]        Search:  PDI My Rx Current Rx Drug Type Rx Written Rx Dispensed Drug Quantity Days Supply Prescriber Name Prescriber [facility] # Payment Method Dispenser   A [name] 08/21/2023 08/24/2023 oxycodone hcl (ir) 10 mg tab 84 14 [name] MD [id number] Other [facility]   A [name] 08/09/2023 08/10/2023 oxycodone hcl (ir) 10 mg tab [address] [name] [id number] Other [facility]   A [name] 07/26/2023 07/27/2023 oxycodone hcl (ir) 10 mg tab 84 14 [name] MD [id number] Other [facility]   A [name] 07/12/2023 07/13/2023 oxycodone hcl (ir) 10 mg tab 84 14 [name] MD [id number] Other [facility]   A [name] 06/28/2023 06/29/2023 oxycodone hcl (ir) 10 mg tab 84 14 [name] MD [id number] Other [facility]   A [name] 06/15/2023 06/15/2023 oxycodone hcl (ir) 10 mg tab 84 14 [name] MD [id number] Other [facility]   A [name] 05/29/2023  06/01/2023 oxycodone hcl (ir) 10 mg tab [address] [name] [id number] Other [facility]   A [name] 05/17/2023 05/18/2023 oxycodone hcl (ir) 10 mg tab 84 14 [name] MD [id number] Other [facility]   A [name] 05/04/2023 05/04/2023 oxycodone hcl (ir) 10 mg tab [address] Glean Hess [id number] Other [facility]   A U [name] 04/18/2023 04/20/2023 oxycodone hcl (ir) 10 mg tab 84 14 [name] MD [id number] Other [facility]   A [name] 04/04/2023 04/06/2023 oxycodone hcl (ir) 10 mg tab 84 14 [name] MD [id number] Other [facility]   A [name] 03/23/2023 03/23/2023 oxycodone hcl (ir) 10 mg tab 84 14 [name] [id number] Other [facility]   A U [name] 03/08/2023 03/09/2023 oxycodone hcl (ir) 10 mg tab 84 14 [name] MD [id number] Other [facility]   A [name] 02/23/2023 02/23/2023 oxycodone hcl (ir) 10 mg tab 84 14 [name] MD [id number] Other [facility]   A [name] 02/07/2023 02/09/2023 oxycodone hcl (ir) 10 mg tab 84 14 [name] [id number] Other [facility]   B U N  O 01/24/2023 01/26/2023 oxycodone hcl (ir) 10 mg tab 84 14 [name] [id number] Other [facility]   [name] 01/10/2023 01/12/2023 oxycodone hcl (ir) 10 mg tab 84 14 [name] MD [id number] Other [name] 12/26/2022 12/29/2022 oxycodone hcl (ir) 10 mg tab [address] [name] MD [id number] Other [name] 12/14/2022 12/15/2022 oxycodone hcl (ir) 10 mg tab [address] [name] MD [id number] Other [name] 11/30/2022 12/01/2022 oxycodone hcl (ir) 10 mg tab [address] [name] MD [id number] Other [name] 11/15/2022 11/17/2022 oxycodone hcl (ir) 10 mg tab [address] [name] MD  [id number] Other [name] 11/03/2022 11/03/2022 oxycodone hcl (ir) 10 mg tab [address] [name] MD [id number] Other [name] 10/19/2022 10/20/2022 oxycodone hcl (ir) 10 mg tab [address] [name] MD [id number] Other [name] 10/03/2022 10/06/2022 oxycodone hcl (ir) 10 mg tab 84 14 [name] [id number] Other [facility]   B [name] 09/21/2022 09/22/2022 oxycodone hcl (ir) 10 mg tab 84 14 [name] MD [id number] Other [name] 09/07/2022 09/08/2022 oxycodone hcl (ir) 10 mg tab [address] [name] MD [id number] Other [facility]

## 2023-09-07 ENCOUNTER — Other Ambulatory Visit: Payer: Self-pay

## 2023-09-18 ENCOUNTER — Other Ambulatory Visit: Payer: Self-pay | Admitting: Primary Care

## 2023-09-19 ENCOUNTER — Other Ambulatory Visit: Payer: Self-pay

## 2023-09-19 MED ORDER — OXYCODONE HCL 10 MG PO TABS *I*
10.0000 mg | ORAL_TABLET | ORAL | 0 refills | Status: DC | PRN
Start: 2023-09-19 — End: 2023-10-02
  Filled 2023-09-19: qty 84, 14d supply, fill #0

## 2023-09-19 NOTE — Telephone Encounter (Signed)
Search Terms: Tristan Fleurant, 10-01-1995  Search Date: 09/19/2023 08:21:33 AM  Searching on behalf of: [id number] - [name]  The Drug Utilization Report below displays all of the controlled substance prescriptions, if any, that your patient has filled in the last twelve months. The information displayed on this report is compiled from pharmacy submissions to the Department, and accurately reflects the information as submitted by the pharmacies.  This report was requested by: [name]  Reference #: [id number]  Practitioner Count: 2  Pharmacy Count: 1  Current Opioid Prescriptions: 1  Current Benzodiazepine Prescriptions: 0  Current Stimulant Prescriptions: 0        Patient Demographic Information [facility])       [facility] First Name Last Name Birth Date Gender Street Address [place] [facility] Zip Code   [name] Aug 04, 1996 Male [address] [place] [address]   [name] 20-Jul-1996 Male [address] [place] [address]        Search:  [facility] My Rx Current Rx Drug Type Rx Written Rx Dispensed Drug Quantity Days Supply Prescriber Name Prescriber [facility] # Payment Method Dispenser   B [name] 09/05/2023 09/07/2023 oxycodone hcl (ir) 10 mg tab 84 14 [name] MD [id number] Other [name] 08/21/2023 08/24/2023 oxycodone hcl (ir) 10 mg tab 84 14 [name] MD [id number] [facility]   [name] 08/09/2023 08/10/2023 oxycodone hcl (ir) 10 mg tab [address] [name] [id number] Other [facility]   [name] 07/26/2023 07/27/2023 oxycodone hcl (ir) 10 mg tab 84 14 [name] MD [id number] Other [facility]   [name] 07/12/2023 07/13/2023 oxycodone hcl (ir) 10 mg tab 84 14 [name] MD [id number] Other [name] 06/28/2023 06/29/2023 oxycodone hcl (ir) 10 mg tab 84 14 [name] MD [id number] Other [name] 06/15/2023  06/15/2023 oxycodone hcl (ir) 10 mg tab 84 14 [name] MD [id number] [facility]   [name] 05/29/2023 06/01/2023 oxycodone hcl (ir) 10 mg tab [address] [name] [id number] Other [facility]   [name] 05/17/2023 05/18/2023 oxycodone hcl (ir) 10 mg tab 84 14 [name] MD [id number] Other [name] 05/04/2023 05/04/2023 oxycodone hcl (ir) 10 mg tab [address] Glean Hess [id number] Other [facility]   [name] 04/18/2023 04/20/2023 oxycodone hcl (ir) 10 mg tab 84 14 [name] MD [id number] Other [name] 04/04/2023 04/06/2023 oxycodone hcl (ir) 10 mg tab 84 14 [name] MD [id number] Other [name] 03/23/2023 03/23/2023 oxycodone hcl (ir) 10 mg tab 84 14 [name] [id number] Other [facility]   B [name] 03/08/2023 03/09/2023 oxycodone hcl (ir) 10 mg tab 84 14 [name] MD [id number] Other [name] 02/23/2023 02/23/2023 oxycodone hcl (ir) 10 mg tab 84 14 [name] MD [id number] [facility]   B  U [name] 02/07/2023 02/09/2023 oxycodone hcl (ir) 10 mg tab 84 14 [name] [id number] Other [facility]   A U [name] 01/24/2023 01/26/2023 oxycodone hcl (ir) 10 mg tab 84 14 [name] [id number] Other [facility]   A U [name] 01/10/2023 01/12/2023 oxycodone hcl (ir) 10 mg tab [address] [name] MD [id number] Other [facility]   A [name] 12/26/2022 12/29/2022 oxycodone hcl (ir) 10 mg tab [address] [name] MD [id number] Other [facility]   A [name] 12/14/2022 12/15/2022 oxycodone hcl (ir) 10 mg tab [address] [name] MD [id number] Other [facility]   A [name] 11/30/2022 12/01/2022 oxycodone hcl (ir) 10 mg tab [address] [name] MD  [id number] Other [facility]   A [name] 11/15/2022 11/17/2022 oxycodone hcl (ir) 10 mg tab [address] [name] MD [id number] Other [facility]   A [name] 11/03/2022 11/03/2022 oxycodone hcl (ir) 10 mg tab [address] [name] MD [id number] Other [facility]   A [name] 10/19/2022 10/20/2022 oxycodone hcl (ir) 10 mg tab [address] [name] MD [id number] Other [facility]   A [name] 10/03/2022 10/06/2022 oxycodone hcl (ir) 10 mg tab 84 14 [name] [id number] Other [facility]   A [name] 09/21/2022 09/22/2022 oxycodone hcl (ir) 10 mg tab 84 14 [name] MD [id number] Other [facility]

## 2023-09-21 ENCOUNTER — Other Ambulatory Visit: Payer: Self-pay

## 2023-09-23 ENCOUNTER — Encounter: Payer: Self-pay | Admitting: Primary Care

## 2023-10-02 ENCOUNTER — Other Ambulatory Visit: Payer: Self-pay | Admitting: Primary Care

## 2023-10-02 NOTE — Telephone Encounter (Signed)
Search Terms: Rakeim Warhurst, November 14, 1995  Search Date: 10/02/2023 07:52:02 AM  Searching on behalf of: [id number] - [name]  The Drug Utilization Report below displays all of the controlled substance prescriptions, if any, that your patient has filled in the last twelve months. The information displayed on this report is compiled from pharmacy submissions to the Department, and accurately reflects the information as submitted by the pharmacies.  This report was requested by: [name]  Reference #: [id number]  Practitioner Count: 2  Pharmacy Count: 1  Current Opioid Prescriptions: 1  Current Benzodiazepine Prescriptions: 0  Current Stimulant Prescriptions: 0        Patient Demographic Information [facility])       [facility] First Name Last Name Birth Date Gender Street Address [facility] Zip Code   [name] 02/11/1996 Male [address] [place] [address]   [name] 09/19/1996 Male [address] [place] [address]        Search:  PDI My Rx Current Rx Drug Type Rx Written Rx Dispensed Drug Quantity Days Supply Prescriber Name Prescriber [facility] # Payment Method Dispenser   A [name] 09/19/2023 09/21/2023 oxycodone hcl (ir) 10 mg tab 84 14 [name] MD [id number] Other [facility]   A [name] 09/05/2023 09/07/2023 oxycodone hcl (ir) 10 mg tab 84 14 [name] MD [id number] Other [facility]   A [name] 08/21/2023 08/24/2023 oxycodone hcl (ir) 10 mg tab 84 14 [name] MD [id number] Other [facility]   A [name] 08/09/2023 08/10/2023 oxycodone hcl (ir) 10 mg tab [address] [name] [id number] Other [facility]   A [name] 07/26/2023 07/27/2023 oxycodone hcl (ir) 10 mg tab 84 14 [name] MD [id number] Other [facility]   A [name] 07/12/2023 07/13/2023 oxycodone hcl (ir) 10 mg tab 84 14 [name] MD [id number] Other [facility]   A [name] 06/28/2023  06/29/2023 oxycodone hcl (ir) 10 mg tab 84 14 [name] MD [id number] Other [facility]   A [name] 06/15/2023 06/15/2023 oxycodone hcl (ir) 10 mg tab 84 14 [name] MD [id number] Other [facility]   A [name] 05/29/2023 06/01/2023 oxycodone hcl (ir) 10 mg tab [address] [name] [id number] Other [facility]   A [name] 05/17/2023 05/18/2023 oxycodone hcl (ir) 10 mg tab 84 14 [name] MD [id number] Other [facility]   A [name] 05/04/2023 05/04/2023 oxycodone hcl (ir) 10 mg tab [address] Glean Hess [id number] Other [facility]   A U [name] 04/18/2023 04/20/2023 oxycodone hcl (ir) 10 mg tab 84 14 [name] MD [id number] Other [facility]   A [name] 04/04/2023 04/06/2023 oxycodone hcl (ir) 10 mg tab 84 14 [name] MD [id number] Other [facility]   A [name] 03/23/2023 03/23/2023 oxycodone hcl (ir) 10 mg tab 84 14 [name] [id number] Other [facility]   A U [name] 03/08/2023 03/09/2023 oxycodone hcl (ir) 10 mg tab 84 14 [name] MD [id number] Other [facility]   A  U [name] 02/23/2023 02/23/2023 oxycodone hcl (ir) 10 mg tab 84 14 [name] MD [id number] Other [facility]   A [name] 02/07/2023 02/09/2023 oxycodone hcl (ir) 10 mg tab 84 14 [name] [id number] Other [facility]   [name] 01/24/2023 01/26/2023 oxycodone hcl (ir) 10 mg tab 84 14 [name] [id number] Other [facility]   [name] 01/10/2023 01/12/2023 oxycodone hcl (ir) 10 mg tab 84 14 [name] MD [id number] Other [name] 12/26/2022 12/29/2022 oxycodone hcl (ir) 10 mg tab [address] [name] MD [id number] Other [name] 12/14/2022 12/15/2022 oxycodone hcl (ir) 10 mg tab [address] [name] MD  [id number] Other [name] 11/30/2022 12/01/2022 oxycodone hcl (ir) 10 mg tab [address] [name] MD [id number] Other [name] 11/15/2022 11/17/2022 oxycodone hcl (ir) 10 mg tab [address] [name] MD [id number] Other [name] 11/03/2022 11/03/2022 oxycodone hcl (ir) 10 mg tab [address] [name] MD [id number] Other [name] 10/19/2022 10/20/2022 oxycodone hcl (ir) 10 mg tab [address] [name] MD [id number] Other [name] 10/03/2022 10/06/2022 oxycodone hcl (ir) 10 mg tab 84 14 [name] [id number] Other [facility]

## 2023-10-03 ENCOUNTER — Other Ambulatory Visit: Payer: Self-pay

## 2023-10-03 MED ORDER — OXYCODONE HCL 10 MG PO TABS *I*
10.0000 mg | ORAL_TABLET | ORAL | 0 refills | Status: DC | PRN
Start: 2023-10-03 — End: 2023-10-18
  Filled 2023-10-03: qty 84, 14d supply, fill #0

## 2023-10-05 ENCOUNTER — Other Ambulatory Visit: Payer: Self-pay

## 2023-10-07 ENCOUNTER — Other Ambulatory Visit: Payer: Self-pay

## 2023-10-07 ENCOUNTER — Ambulatory Visit: Payer: BLUE CROSS/BLUE SHIELD | Attending: Primary Care | Admitting: Primary Care

## 2023-10-07 ENCOUNTER — Other Ambulatory Visit
Admission: RE | Admit: 2023-10-07 | Discharge: 2023-10-07 | Disposition: A | Discharge status: Home / Self Care | Payer: Self-pay | Source: Ambulatory Visit

## 2023-10-07 ENCOUNTER — Encounter: Payer: Self-pay | Admitting: Primary Care

## 2023-10-07 VITALS — BP 136/63 | HR 78 | Temp 98.0°F | Wt 198.0 lb

## 2023-10-07 DIAGNOSIS — R61 Generalized hyperhidrosis: Secondary | ICD-10-CM | POA: Insufficient documentation

## 2023-10-07 DIAGNOSIS — D509 Iron deficiency anemia, unspecified: Secondary | ICD-10-CM | POA: Insufficient documentation

## 2023-10-07 DIAGNOSIS — Z23 Encounter for immunization: Secondary | ICD-10-CM | POA: Insufficient documentation

## 2023-10-07 DIAGNOSIS — Z0000 Encounter for general adult medical examination without abnormal findings: Secondary | ICD-10-CM | POA: Insufficient documentation

## 2023-10-07 DIAGNOSIS — Z1321 Encounter for screening for nutritional disorder: Secondary | ICD-10-CM | POA: Insufficient documentation

## 2023-10-07 DIAGNOSIS — D571 Sickle-cell disease without crisis: Secondary | ICD-10-CM | POA: Insufficient documentation

## 2023-10-07 DIAGNOSIS — Z13220 Encounter for screening for lipoid disorders: Secondary | ICD-10-CM | POA: Insufficient documentation

## 2023-10-07 DIAGNOSIS — Z13228 Encounter for screening for other metabolic disorders: Secondary | ICD-10-CM | POA: Insufficient documentation

## 2023-10-07 DIAGNOSIS — G4700 Insomnia, unspecified: Secondary | ICD-10-CM | POA: Insufficient documentation

## 2023-10-07 DIAGNOSIS — E538 Deficiency of other specified B group vitamins: Secondary | ICD-10-CM | POA: Insufficient documentation

## 2023-10-07 DIAGNOSIS — Z130 Encounter for screening for diseases of the blood and blood-forming organs and certain disorders involving the immune mechanism: Secondary | ICD-10-CM | POA: Insufficient documentation

## 2023-10-07 DIAGNOSIS — Z1329 Encounter for screening for other suspected endocrine disorder: Secondary | ICD-10-CM | POA: Insufficient documentation

## 2023-10-07 DIAGNOSIS — E559 Vitamin D deficiency, unspecified: Secondary | ICD-10-CM | POA: Insufficient documentation

## 2023-10-07 LAB — CBC AND DIFFERENTIAL
Baso # K/uL: 0.1 10*3/uL (ref 0.0–0.2)
Eos # K/uL: 0.3 10*3/uL (ref 0.0–0.5)
Hematocrit: 25 % — ABNORMAL LOW (ref 37–52)
Hemoglobin: 7.3 g/dL — ABNORMAL LOW (ref 12.0–17.0)
IMM Granulocytes #: 0 10*3/uL (ref 0.0–0.0)
IMM Granulocytes: 0.4 %
Lymph # K/uL: 1.6 10*3/uL (ref 1.0–5.0)
MCV: 64 fL — ABNORMAL LOW (ref 75–100)
Mono # K/uL: 0.7 10*3/uL (ref 0.1–1.0)
Neut # K/uL: 5 10*3/uL (ref 1.5–6.5)
Nucl RBC # K/uL: 0 10*3/uL (ref 0.0–0.0)
Nucl RBC %: 0 /100{WBCs} (ref 0.0–0.2)
Platelets: 309 10*3/uL (ref 150–450)
RBC: 3.9 MIL/uL — ABNORMAL LOW (ref 4.0–6.0)
RDW: 19.9 % — ABNORMAL HIGH (ref 0.0–15.0)
Seg Neut %: 64.9 %
WBC: 7.7 10*3/uL (ref 3.5–11.0)

## 2023-10-07 LAB — PAIN CLINIC PROFILE
Amphetamine,UR: NEGATIVE
Benzodiazepinen,UR: NEGATIVE
Cocaine/Metab,UR: NEGATIVE
Opiates,UR: NEGATIVE
Oxycodone/Oxymorphone,UR: POSITIVE
THC Metabolite,UR: NEGATIVE

## 2023-10-07 LAB — RBC MORPHOLOGY

## 2023-10-07 LAB — RETICULOCYTES
Retic %: 2.2 % (ref 0.7–2.3)
Retic Abs: 83.9 10*3/uL (ref 33.8–124.0)

## 2023-10-07 LAB — MICROALBUMIN, URINE, RANDOM
Creatinine,UR: 85 mg/dL (ref 20–300)
Microalbumin,UR: <1.2 mg/dL

## 2023-10-07 MED ORDER — TRAZODONE HCL 50 MG PO TABS *I*
25.0000 mg | ORAL_TABLET | Freq: Every evening | ORAL | 5 refills | Status: AC
Start: 2023-10-07 — End: ?
  Filled 2023-10-07: qty 15, 30d supply, fill #0

## 2023-10-07 MED ORDER — EZY DOSE PILL CUTTER MISC
1.0000 | Freq: Every day | 0 refills | Status: DC
Start: 2023-10-07 — End: 2024-02-26
  Filled 2023-10-07: qty 1, fill #0

## 2023-10-07 MED ORDER — ERGOCALCIFEROL 50000 UNIT PO CAPS *I*
50000.0000 [IU] | ORAL_CAPSULE | ORAL | 0 refills | Status: DC
Start: 2023-10-07 — End: 2024-02-26
  Filled 2023-10-07: qty 6, 42d supply, fill #0

## 2023-10-07 MED ORDER — NALOXONE HCL 4 MG/0.1ML NA LIQD *I*
NASAL | 5 refills | Status: DC
Start: 2023-10-07 — End: 2023-12-13
  Filled 2023-10-07: qty 2, 1d supply, fill #0

## 2023-10-07 NOTE — Patient Instructions (Addendum)
[facility]  [facility]  [address]  [place], [place] [address]  P: [phone]  F: [phone]    YOUR VISIT PLAN:  - Start trazodone 25mg  at bedtime     - Please get labs today    - Referral placed to eye doctor    - Referral placed to sleep medicine    - Menactra and Bexsero vaccines today    - If you or a loved one are struggling with a mental health crisis, or a substance use disorder, CALL [phone]. You will be connected to trained counselors who will listen, provide support, and connect you to local resources. Visit [email or web address] for more information. For finding a local Mental Health Counselor or Therapist, visit [email or web address] and enter your zip code.      Please remember to bring an updated and complete medication list to every visit.

## 2023-10-07 NOTE — Progress Notes (Signed)
 [facility]  Visit Note    [facility]  [facility]  [address]  [place], [place] [address]  P: [phone]  F: [phone]     REASON FOR TODAY'S VISIT      Chief Complaint   Patient presents with    Follow-up     No conerns     Seen in SCD clinic    SUBJECTIVE      Patient presents to clinic in person to discuss the following:    Migraines  Migraines 2x/week due to lack of sleep  Tylenol helps  Has not had vision checked in some time, feels that he needs new glasses Rx    Insomnia  Has a hard time falling asleep  Continues to work nights (8pm-4am)   Melatonin did not provide benefit  Has not had a sleep study  Denies snoring  Feels fatigued during the day    SCD  Pain primarily in back  Oxy helps  Tylenol helps  Appetite has been OK  Weight stable  Willing to get Men B and ACWY vaccines today  Declines flu and COVID vaccines    Night sweats  Started shortly after being seen in [place]  Occurs only when sleeping  Wakes up drenched   No new medications  Often takes oxycodone every 5-6 hours  Denies unintentional weight loss  Denies blood in stool or irregular bleeding    Health Care Maintenance:   Needs a dentist. 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       Immunization History   Administered Date(s) Administered    Covid-19 mRNA vaccine (PFIZER) IM  30 mcg/0.56mL 08/26/2020    DTaP 08/23/1997, 10/15/1997, 03/09/1998, 03/13/2000    HPV (GARDASIL 9) 9-valent 06/09/2015    HPV, quadrivalent 05/15/2011    Hepatitis A Ped/Adol, 2 dose 05/12/2010, 05/15/2011    Hepatitis B 08/23/1997, 10/15/1997, 03/13/2000    Hepatitis B Adult 05/14/2018    HiB(Acthib/Hiberix) 08/23/1997, 10/15/1997, 03/13/2000    IPV 08/23/1997, 10/15/1997, 03/13/2000, 06/09/2015    Influenza Peds PF(6-2mo) 07/29/1998    Influenza Quad 0.44mL prefilled syringe/single dose vial (FluLaval,Fluzone,Afluria,Fluarix)Historical 06/30/2014, 08/16/2014, 06/09/2015, 09/07/2016, 07/02/2017, 06/20/2018    Influenza Split (GSK) 09/30/2007, 07/13/2008    Influenza, split virus, trivalent, PF 08/21/2000, 09/23/2000, 09/30/2007, 07/13/2008    MENINGOCOCCAL Polysaccharide 04/17/2000    MMR 03/13/2000, 06/09/2015    Meningococcal ACWY, Unspecified 05/05/2009, 05/12/2010    Meningococcal Conjugate MCV4P (Menactra) 10/22/2014    Meningococcal Group B, OMV(Bexsero) 06/09/2015, 07/20/2015    Pneumococcal 13-valent Conjugate (Prevnar 13) 08/16/2014    Pneumococcal Conjugate (Prevnar 7) 04/17/2000, 06/26/2000, 08/21/2000    Pneumococcal Polysaccharide (Pneumovax) 06/26/2000, 08/21/2000, 06/09/2015    Tdap 05/05/2009, 06/24/2020    Varicella 08/23/1997, 03/13/2000, 06/09/2015       No Known Allergies (drug, envir, food or latex)     Family History   Problem Relation Age of Onset    Other Mother     Sickle cell anemia Sister     Sickle cell anemia Brother     Sickle cell anemia Brother        Social  Drivers of Health     Tobacco Use: High Risk (10/07/2023)    Patient History     Smoking Tobacco Use: Every Day     Smokeless Tobacco Use: Never     Passive Exposure: Not on file   Alcohol Use: Not At Risk (03/22/2021)    Alcohol Use     Frequency of Alcohol Consumption: 0: Never     Average Number of Drinks: 0: None, I do not drink     Frequency of Binge Drinking: 0: Never   Financial Resource Strain: Not on file   Food Insecurity: No Food Insecurity (05/19/2021)    Hunger Vital Sign     Worried About Running Out of Food in the Last Year: Never true     Ran Out of Food in the Last Year: Never true   Transportation Needs: No Transportation Needs (05/19/2021)    PRAPARE - Therapist, art (Medical): No     Lack of Transportation (Non-Medical): No   Physical Activity: Not on file   Stress: Not on file   Social Connections: Not on file   Intimate Partner Violence: Not At Risk (01/22/2023)    Intimate Partner Violence     Fear of Current or Ex-Partner: No   Depression: Not at risk (10/07/2023)    Depression     Last PHQ9 (Date Compare): 5   Housing Stability: Low Risk  (05/19/2021)    Housing Stability Vital Sign     Unable to Pay for Housing in the Last Year: No     Number of Places Lived in the Last Year: 1     Unstable Housing in the Last Year: No   Utilities: Not on file       Pertinent Review of Systems as per above.  Past Medical History, Social History, Family History, and Medications/allergies reviewed during this visit.    OBJECTIVE      BP 136/63   Pulse 78   Temp 36.7 C (98 F) (Temporal)  Wt 89.8 kg (198 lb)   SpO2 97%   BMI 26.12 kg/m     Physical Exam  GEN: Alert, pleasant well adult in NAD.   HEENT: Normocephalic and atraumatic.PERRL. Moist mucous membranes  NECK: Supple, no lymphadenopathy or thyromegaly   PULM: Easy respirations, well aerated, CTA bilaterally.   CVS: RRR, no murmur, normal S1& S2.   ABD:  soft, nontender, nondistended, no hepatosplenomegaly, no masses.  SKIN:  No rashes or concerning lesions    NEURO: steady gait  Ext: 5/5 muscle strength in all extremities, no clubbing, edema or cyanosis     ASSESSMENT / DIAGNOSIS     1. Sickle cell disease (Primary)  - Underlying condition, all treatment decisions made with this in consideration.  - Comprehensive metabolic panel; Future  - TSH; Future  - Vitamin D; Future  - CBC and differential; Future  - Reticulocytes; Future  - Amylase; Future  - Lipase; Future  - Microalbumin, Urine, Random; Future  - Pain clinic profile-Urine (Includes Oxycodone); Future  - Folate; Future  - TIBC; Future  - Ferritin; Future  - Transferrin; Future  - AMB REFERRAL TO OPHTHALMOLOGY/OPTOMETRY - for retina exam and attempt to improve HA  - Misc. Devices (EZY DOSE PILL CUTTER) MISC; By 1 each no specified route daily.  Dispense: 1 each; Refill: 0  - Pain clinic profile-Urine (Includes Oxycodone)  - Microalbumin, Urine, Random  - naloxone (NARCAN) 4 mg/0.1 mL nasal spray; Instill 1 spray in 1 nostril once for opioid reversal. Repeat in alternating nostrils with new package every 2-3 minutes until response  Dispense: 2 each; Refill: 5  - Referral to dental placed    2. Insomnia, unspecified type  - Start traZODone (DESYREL) 50 mg tablet; Take 0.5 tablets (25 mg total) by mouth at bedtime for [employer].  Dispense: 15 tablet; Refill: 5  - AMB REFERRAL TO SLEEP MEDICINE - NORTHERN REGION to rule out OSA    3. Night sweats  - Unsure of etiology, oxycodone can cause sweating while sleeping as a side effect  - Comprehensive metabolic panel; Future  - CBC and differential; Future  - HIV-1/2  Antigen/Antibody Screen with Confirmation; Future  - TB AG t-cell stimulation; Future    4. Need for prophylactic vaccination and inoculation against meningococcus  - Meningococcal Group B, OMV (Bexsero) - given today    5. Immunization due  - Meningococcal(MENVEO) conjugate vaccine 4-valent MCV4O - given today    6. Vitamin D deficiency  - Start ergocalciferol 50,000  unit capsule; Take 1 capsule (50,000 units total) by mouth once a week for Vitamin D Deficiency. Stop taking after 6 weeks.  Dispense: 6 capsule; Refill: 0    -- Patient was instructed to contact me should sympto

## 2023-10-07 NOTE — Progress Notes (Signed)
 [facility] Nutrition Screening        Appetite: good     Allergies/ Intolerances: NKFA    Changes in appetite in last 3 months?     Usual Foods Recall:  2- 3 meals a day usually Lunch and dinner   B skips   L if have leftovers will eat these, or will get a salad   D switches whatever mom makes, pasta potatoes, vegetables   No pork green beans, no spicy foods  Beverages: Gatorade waters, 4-5   Hydration Needs: 2-3L  No vomiting reflux,      Weight Status/Goal: Pt is WNL; weight maintenance desired.  IBW:  178# %IBW: 111%  Any Recent Weight loss: +4# in the last 6 months     Vitamins/Mineral Taken: folic acid,   Oral Nutrition Supplements Taken:   Pertinent Meds: zofran- hasn't had recently     Food Frequency:     Zinc Foods (oysters/pork/beef/chicken/tofu/legumes/yogurt/mushrooms/oatmeal/fortified cereal/dairy foods):  Frequency of intake:occasional    Folate Foods (fortified cereal/bread/grains- rice and pastas/spinach/black eyed peas/asparagus/brusselsprouts):  Frequency of intake:daily    Magnesium foods: (chia seeds/ almonds/ spinach/cashews/ cereals/black beans/edamame/soy products)  Frequency of intake:rare    Vit D foods (trout/salmon/mushrooms/fortified milks/sardines/eggs)  Frequency of intake:rare    Omega 3 Foods (flaxseed/flaxseed oil/ chia seeds/salmon/herring/canola oil/ sardines/mackerel/trout/walnuts) Frequency of intake:rare      Summary    Review of Recent Labs/ Recommended Labs: Vit D low 10 8 months ago, folate wnl in April       Vitamin D 50,000 international unit 1 a week       Follow up: prn      Hartford Poli, RD  10/07/23 2:45 PM

## 2023-10-08 ENCOUNTER — Other Ambulatory Visit: Payer: Self-pay

## 2023-10-08 LAB — COMPREHENSIVE METABOLIC PANEL
ALT: 135 U/L — ABNORMAL HIGH (ref 0–50)
AST: 91 U/L — ABNORMAL HIGH (ref 0–50)
Albumin: 3.5 g/dL (ref 3.5–5.2)
Alk Phos: 1033 U/L — ABNORMAL HIGH (ref 40–130)
Anion Gap: 11 (ref 7–16)
Bilirubin,Total: 1.6 mg/dL — ABNORMAL HIGH (ref 0.0–1.2)
CO2: 22 mmol/L (ref 20–28)
Calcium: 8.4 mg/dL — ABNORMAL LOW (ref 9.0–10.3)
Chloride: 107 mmol/L (ref 96–108)
Creatinine: 0.85 mg/dL (ref 0.67–1.17)
Glucose: 72 mg/dL (ref 60–99)
Lab: 8 mg/dL (ref 6–20)
Potassium: 3.9 mmol/L (ref 3.3–5.1)
Sodium: 140 mmol/L (ref 133–145)
Total Protein: 7.7 g/dL (ref 6.3–7.7)
eGFR BY CREAT: 123 *

## 2023-10-08 LAB — TIBC
Iron: 22 ug/dL — ABNORMAL LOW (ref 45–170)
TIBC: 417 ug/dL (ref 250–450)
Transferrin Saturation: 5 % — ABNORMAL LOW (ref 20–55)

## 2023-10-08 LAB — HIV-1/2  ANTIGEN/ANTIBODY SCREEN WITH CONFIRMATION: HIV 1&2 ANTIGEN/ANTIBODY: NONREACTIVE

## 2023-10-08 LAB — AMYLASE: Amylase: 88 U/L (ref 28–100)

## 2023-10-08 LAB — FOLATE: Folate: 4.2 ng/mL — AB (ref >=4.6)

## 2023-10-08 LAB — VITAMIN D: 25-OH Vit Total: 8 ng/mL — ABNORMAL LOW (ref 30–60)

## 2023-10-08 LAB — TSH: TSH: 0.81 u[IU]/mL (ref 0.27–4.20)

## 2023-10-08 LAB — LIPASE: Lipase: 17 U/L (ref 13–60)

## 2023-10-08 LAB — TRANSFERRIN: Transferrin: 309 mg/dL (ref 200–360)

## 2023-10-08 LAB — FERRITIN: Ferritin: 10 ng/mL — ABNORMAL LOW (ref 20–250)

## 2023-10-08 NOTE — Progress Notes (Signed)
 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 -  N/A  LANGUAGE - English    Recent Screening Results:  PHQ-9:       10/07/2023     2:47 PM   PHQ   PHQ Total Score 5     GAD-7:2   DAST-10:  No data recorded   AUDIT-C:   Review Flowsheet          03/22/2021   Audit-C Dates   Total Score 0      Details                  COMM @COMM @    Mental Health Information  Patient Identified Stressors: feels fine  Coping: N/A  Strengths: N/A  Current MH Treatment: No  Past MH Treatment: No  Current mediations:   Current Outpatient Medications:     traZODone (DESYREL) 50 mg tablet, Take 0.5 tablets (25 mg total) by mouth at bedtime for [employer]., Disp: 15 tablet, Rfl: 5    Misc. Devices (EZY DOSE PILL CUTTER) MISC, By 1 each no specified route daily., Disp: 1 each, Rfl: 0    naloxone (NARCAN) 4 mg/0.1 mL nasal spray, Instill 1 spray in 1 nostril once for opioid reversal. Repeat in alternating nostrils with new package every 2-3 minutes until response, Disp: 2 each, Rfl: 5    ergocalciferol 50,000 unit capsule, Take 1 capsule (50,000 units total) by mouth once a week for Vitamin D Deficiency. Stop taking after 6 weeks., Disp: 6 capsule, Rfl: 0    oxyCODONE (ROXICODONE) 10 mg immediate release tablet, Take 1 tablet (10 mg total) by mouth every 4 hours as needed for Sickle Cell Disease Pain. Max daily dose: 6 tablets (60 mg), Disp: 84 tablet, Rfl: 0    folic acid (FOLVITE) 1 mg tablet, TAKE 1 TABLET (1 MG TOTAL) BY MOUTH DAILY, Disp: 30 tablet, Rfl: 5    mesalamine (ASACOL HD) 800 mg tablet, Take 3 tablets (2,400 mg total) by mouth 2 times daily, Disp: 180 tablet, Rfl: 5    Presenting Problem: N/A    Desired Outcome: N/A     Additional relevant psych information: N/A    Biopsychosocial  Assessment    Living arrangements/Residential setting: apartment -  rents  Who resides with patient?: sibling(s)  Family/Support System: mother  Spiritual, Religious, Cultural Information - both  Source(s) of Income: full time employment with N/A  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  Writer met with the patient and performed Comprehensive Mental Health Coordination Assessment.  Lowell Makara III is aware of writer's role and availability.  Jelani Trueba III has support from mother and brother.  Resources provided as follows: none at this time patient has minimal anxiety and mild depression.  Patient navigator reviewed screening scores with patient and completed Biopsychosocial assessment.    Follow up meeting Date: 10/06/2024

## 2023-10-09 ENCOUNTER — Encounter: Payer: Self-pay | Admitting: Primary Care

## 2023-10-09 ENCOUNTER — Other Ambulatory Visit: Payer: Self-pay

## 2023-10-09 LAB — CONFIRM OPIATES: Confirm Opiates: POSITIVE

## 2023-10-09 LAB — TB AG T-CELL STIMULATION
TB AG 1: 0 [IU]/mL
TB AG 2: 0.01 [IU]/mL
TB AG T-Cell Stim: NEGATIVE [IU]/mL

## 2023-10-09 MED ORDER — FERROUS SULFATE 325 (65 FE) MG PO TABS *WRAPPED* *I*
325.0000 mg | ORAL_TABLET | ORAL | 2 refills | Status: DC
  Filled 2023-10-09: qty 15, 30d supply, fill #0

## 2023-10-09 MED ORDER — FOLIC ACID 1 MG PO TABS *I*
1.0000 mg | ORAL_TABLET | ORAL | 5 refills | Status: AC
Start: 2023-10-09 — End: 2024-10-07
  Filled 2023-10-09: qty 30, 30d supply, fill #0

## 2023-10-09 NOTE — Addendum Note (Signed)
 Addended by: [name] on: 10/09/2023 01:11 PM     Modules accepted: Orders

## 2023-10-10 ENCOUNTER — Other Ambulatory Visit: Payer: Self-pay

## 2023-10-11 ENCOUNTER — Telehealth: Payer: Self-pay | Admitting: Optometry

## 2023-10-11 NOTE — Progress Notes (Signed)
 Sickle Cell Disease RN Coordinator Note:    I saw Cameron [name] at today's visit. Their goals for today's visit include:   Healthcare maintenance    Cameron [name] has had 0 ED visits and 1 admissions since their last visit.     Medications reviewed, Cameron [name]'s current treatment for sickle cell disease includes: no disease modifying threapy    Healthcare maintenance review:  Urine microalbumin (yearly for all): done today  Dilated eye exam (yearly for all): due -- referral placed  Prior head MRI (one time for adults with HbSS or HbS-beta zero): no  Annual comprehensive exam (yearly for all): no   Immunizations:  MenACWY (q5 years): Given at today's visit  MenB (q2-3 years): Deferred  PCV20: Deferred  Influenza: Politely declined  COVID19: Up to date  Tdap: Up to date    Immunization History   Administered Date(s) Administered    Covid-19 mRNA vaccine (PFIZER) IM 30 mcg/0.93mL 08/26/2020    DTaP 08/23/1997, 10/15/1997, 03/09/1998, 03/13/2000    HPV (GARDASIL 9) 9-valent 06/09/2015    HPV, quadrivalent 05/15/2011    Hepatitis A Ped/Adol, 2 dose 05/12/2010, 05/15/2011    Hepatitis B 08/23/1997, 10/15/1997, 03/13/2000    Hepatitis B Adult 05/14/2018    HiB(Acthib/Hiberix) 08/23/1997, 10/15/1997, 03/13/2000    IPV 08/23/1997, 10/15/1997, 03/13/2000, 06/09/2015    Influenza Peds PF(6-70mo) 07/29/1998    Influenza Quad 0.46mL prefilled syringe/single dose vial (FluLaval,Fluzone,Afluria,Fluarix)Historical 06/30/2014, 08/16/2014, 06/09/2015, 09/07/2016, 07/02/2017, 06/20/2018    Influenza Split (GSK) 09/30/2007, 07/13/2008    Influenza, split virus, trivalent, PF 08/21/2000, 09/23/2000, 09/30/2007, 07/13/2008    MENINGOCOCCAL Polysaccharide 04/17/2000    MMR 03/13/2000, 06/09/2015    Meningococcal ACWY, Unspecified 05/05/2009, 05/12/2010    Meningococcal Conjugate MCV4O (Menveo) 10/07/2023    Meningococcal Conjugate MCV4P (Menactra) 10/22/2014    Meningococcal Group B, OMV(Bexsero) 06/09/2015, 07/20/2015, 10/07/2023     Pneumococcal 13-valent Conjugate (Prevnar 13) 08/16/2014    Pneumococcal Conjugate (Prevnar 7) 04/17/2000, 06/26/2000, 08/21/2000    Pneumococcal Polysaccharide (Pneumovax) 06/26/2000, 08/21/2000, 06/09/2015    Tdap 05/05/2009, 06/24/2020    Varicella 08/23/1997, 03/13/2000, 06/09/2015       Health education  Educated on the following SCD topics: comprehensive healthcare maintenance needs  Areas of strength: current medications (names, indications, doses) and strategies for pain management  Areas of growth: comprehensive healthcare maintenance needs  Knowledge goals for next visit:  follow up on overdue healthcare maintenance  Pre- and post-education test administered?     Mental health review:  PHQ-A/9 up to date (yearly if no depression, q3-6 months if >10) ? yes  GAD7 within the past year? yes  COMM/opioid risk assessment up to date (yearly if prescribed opioids)? 2022  Depression? mild  Anxiety? None or minimal  Treatment desired? no  Referred for mental health services? no    Review Flowsheet          10/07/2023 03/22/2021 03/21/2020 01/07/2018 01/01/2017   Dates   PHQ2 Q1 - Interest/Pleasure - - N N -   PHQ2 Q2 - Down, Depressed, Hopeless - - N N -   PHQ9 Q9 - Better Off Dead 0 - - - -   PHQ9 Total Score 5 0 - - 0   PHQ9 Severity Level Mild None or Minimal - - -      Details                       10/07/2023     2:48 PM 03/22/2021     4:04  PM 01/07/2018     2:31 PM   GAD-7 Dates   Total Score 2 0 0        Review Flowsheet          03/22/2021   PTC COMM SCORE ONLY   COMM Score 0      Details                   Met with Cameron [name] during [facility] Cell Clinic follow up visit. Last seen April 2024 due to lack of insurance coverage. States he has a new job with new insurance and would like to get caught up on his healthcare needs. Reviewed healthcare maintenance recommendations. Last eye exam July 2023. New referral placed today and SCD RN will help schedule as needed. Labs ordered, urine obtained. Met with RD for nutrition  screening and SW/CM for mental health assessment. No further needs at this time. Will follow up with eye exam appointment and remain available for further care coordination needs as needed.  Kipp Laurence, RN

## 2023-10-11 NOTE — Telephone Encounter (Signed)
 Spoke to pt, he is unsure at this time what his health insurance is.  I sent him a mychart message that he will reply to once he has that information.

## 2023-10-16 ENCOUNTER — Other Ambulatory Visit: Payer: Self-pay | Admitting: Primary Care

## 2023-10-16 NOTE — Telephone Encounter (Signed)
 Patient Demographic Information [facility])   [facility] First Name Last Name Birth Date Gender Street Address [name] Zip Code   [name] 1995-10-20 Male [address] [place] [address]   Search:  PDI My Rx Current Rx Drug Type Rx Written Rx Dispensed Drug Quantity Days Supply Prescriber Name Prescriber [facility] # Payment Method Dispenser   [name] 10/03/2023 10/05/2023 oxycodone hcl (ir) 10 mg tab [address] [name] [id number] Other [facility]   [name] 09/19/2023 09/21/2023 oxycodone hcl (ir) 10 mg tab 84 14 [name] MD [id number] Other [name] 09/05/2023 09/07/2023 oxycodone hcl (ir) 10 mg tab 84 14 [name] MD [id number] Other [name] 08/21/2023 08/24/2023 oxycodone hcl (ir) 10 mg tab 84 14 [name] MD [id number] Other [facility]   Cameron [name] 08/09/2023 08/10/2023 oxycodone hcl (ir) 10 mg tab [address] [name] [id number] Other [facility]   [name] 07/26/2023 07/27/2023 oxycodone hcl (ir) 10 mg tab 84 14 [name] MD [id number] Other [facility]   Cameron [name] 07/12/2023 07/13/2023 oxycodone hcl (ir) 10 mg tab 84 14 [name] MD [id number] Other [name] 06/28/2023 06/29/2023 oxycodone hcl (ir) 10 mg tab 84 14 [name] MD [id number] Other [name] 06/15/2023 06/15/2023 oxycodone hcl (ir) 10 mg tab 84 14 [name] MD [id number] Other [facility]   Cameron [name] 05/29/2023 06/01/2023 oxycodone hcl (ir) 10 mg tab [address] [name] [id number] Other [facility]   [name] 05/17/2023 05/18/2023 oxycodone hcl (ir) 10 mg tab 84 14 [name] MD [id number] Other [name] 05/04/2023 05/04/2023 oxycodone hcl (ir) 10 mg tab [address] Glean Hess [id number] Other [facility]   [name] 04/18/2023 04/20/2023 oxycodone hcl (ir) 10 mg tab 84 14 [name] MD [id number] Other [facility]   Cameron [name] 04/04/2023 04/06/2023 oxycodone hcl (ir) 10 mg tab 84 14 [name] MD [id number] Other [name] 03/23/2023 03/23/2023 oxycodone hcl (ir) 10 mg tab 84 14 [name] [id number] Other [facility]   [name] 03/08/2023 03/09/2023 oxycodone hcl (ir) 10 mg tab 84 14 [name] MD [id number] Other [name] 02/23/2023 02/23/2023 oxycodone hcl (ir) 10 mg tab 84 14 [name] MD [id number] Other [name] 02/07/2023 02/09/2023 oxycodone hcl (ir) 10 mg tab 84 14 [name] [id number] Other [facility]   [name] 01/24/2023 01/26/2023 oxycodone hcl (ir) 10 mg tab 84 14 [name] [id number] Other [facility]   [name] 01/10/2023 01/12/2023 oxycodone hcl (ir) 10 mg tab 84 14 [name] MD [id number] Other [facility]   [name] 12/26/2022  12/29/2022 oxycodone hcl (ir) 10 mg tab [address] [name] MD [id number] Other [facility]   [name] 12/14/2022 12/15/2022 oxycodone hcl (ir) 10 mg tab [address] [name] MD [id number] Other [facility]   [name] 11/30/2022 12/01/2022 oxycodone hcl (ir) 10 mg tab [address] [name] MD [id number] Other [facility]   [name] 11/15/2022 11/17/2022 oxycodone hcl (ir) 10 mg tab [address] [name] MD [id number] Other [facility]   [name] 11/03/2022 11/03/2022 oxycodone hcl (ir) 10 mg tab [address] [name] MD [id number] Other [facility]   [name] 10/19/2022 10/20/2022 oxycodone hcl (ir) 10 mg tab [address] [name] MD [id number] Other [facility]

## 2023-10-17 ENCOUNTER — Other Ambulatory Visit: Payer: Self-pay

## 2023-10-18 ENCOUNTER — Other Ambulatory Visit: Payer: Self-pay

## 2023-10-18 ENCOUNTER — Encounter: Payer: Self-pay | Admitting: Primary Care

## 2023-10-18 MED ORDER — OXYCODONE HCL 10 MG PO TABS *I*
10.0000 mg | ORAL_TABLET | ORAL | 0 refills | Status: DC | PRN
Start: 2023-10-18 — End: 2023-10-30
  Filled 2023-10-18: qty 84, 14d supply, fill #0

## 2023-10-19 ENCOUNTER — Other Ambulatory Visit: Payer: Self-pay

## 2023-10-30 ENCOUNTER — Other Ambulatory Visit: Payer: Self-pay

## 2023-10-30 ENCOUNTER — Other Ambulatory Visit: Payer: Self-pay | Admitting: Primary Care

## 2023-10-30 MED ORDER — OXYCODONE HCL 10 MG PO TABS *I*
10.0000 mg | ORAL_TABLET | ORAL | 0 refills | Status: DC | PRN
Start: 2023-10-30 — End: 2023-11-13
  Filled 2023-10-30: qty 84, 14d supply, fill #0

## 2023-10-30 NOTE — Telephone Encounter (Signed)
Cameron Demographic Information [facility])       [name] First [name] Last [name] Birth Date Gender Street Address [facility] Zip Code   [name] Mar 05, 1996 Male [address] [place] [address]   [name] Sep 07, 1996 Male [address] [place] [address]   Search:  PDI My Rx Current Rx Drug Type Rx Written Rx Dispensed Drug Quantity Days Supply Prescriber [name] Prescriber [facility] # Payment Method Dispenser   A [name] 10/18/2023 10/19/2023 oxycodone hcl (ir) 10 mg tab 84 14 [name] MD [id number] Other [facility]   [name] 10/03/2023 10/05/2023 oxycodone hcl (ir) 10 mg tab [address] [name] [id number] Other [facility]   [name] 09/19/2023 09/21/2023 oxycodone hcl (ir) 10 mg tab 84 14 [name] MD [id number] Other [facility]   [name] 09/05/2023 09/07/2023 oxycodone hcl (ir) 10 mg tab 84 14 [name] MD [id number] Other [facility]   [name] 08/21/2023 08/24/2023 oxycodone hcl (ir) 10 mg tab 84 14 [name] MD [id number] Other [facility]   [name] 08/09/2023 08/10/2023 oxycodone hcl (ir) 10 mg tab [address] [name] [id number] Other [facility]   [name] 07/26/2023 07/27/2023 oxycodone hcl (ir) 10 mg tab 84 14 [name] MD [id number] Other [facility]   [name] 07/12/2023 07/13/2023 oxycodone hcl (ir) 10 mg tab 84 14 [name] MD [id number] Other [facility]   [name] 06/28/2023 06/29/2023 oxycodone hcl (ir) 10 mg tab 84 14 [name] MD [id number] Other [facility]   [name] 06/15/2023 06/15/2023 oxycodone hcl (ir) 10 mg tab 84 14 [name] MD [id number] Other [facility]   [name] 05/29/2023 06/01/2023 oxycodone hcl (ir) 10 mg tab [address] [name] [id number] Other [facility]   [name] 05/17/2023 05/18/2023 oxycodone hcl  (ir) 10 mg tab 84 14 [name] MD [id number] Other [facility]   [name] 05/04/2023 05/04/2023 oxycodone hcl (ir) 10 mg tab [address] Glean Hess [id number] Other [facility]   [name] 04/18/2023 04/20/2023 oxycodone hcl (ir) 10 mg tab 84 14 [name] MD [id number] Other [facility]   [name] 04/04/2023 04/06/2023 oxycodone hcl (ir) 10 mg tab 84 14 [name] MD [id number] Other [facility]   [name] 03/23/2023 03/23/2023 oxycodone hcl (ir) 10 mg tab 84 14 [name] [id number] Other [facility]   [name] 03/08/2023 03/09/2023 oxycodone hcl (ir) 10 mg tab 84 14 [name] MD [id number] Other [facility]   [name] 02/23/2023 02/23/2023 oxycodone hcl (ir) 10 mg tab 84 14 [name] MD [id number] Other [facility]   [name] 02/07/2023 02/09/2023 oxycodone hcl (ir) 10 mg tab 84 14 [name] [id number] Other [facility]   [name] 01/24/2023 01/26/2023 oxycodone hcl (ir) 10 mg tab 84  14 [name] [id number] Other [facility]   [name] 01/10/2023 01/12/2023 oxycodone hcl (ir) 10 mg tab [address] [name] MD [id number] Other [name] 12/26/2022 12/29/2022 oxycodone hcl (ir) 10 mg tab [address] [name] MD [id number] Other [name] 12/14/2022 12/15/2022 oxycodone hcl (ir) 10 mg tab [address] [name] MD [id number] Other [name] 11/30/2022 12/01/2022 oxycodone hcl (ir) 10 mg tab [address] [name] MD [id number] Other [name] 11/15/2022 11/17/2022 oxycodone hcl (ir) 10 mg tab [address] [name] MD [id number] Other [name] 11/03/2022 11/03/2022 oxycodone hcl (ir) 10 mg tab [address] [name] MD [id number] Other [facility]

## 2023-11-02 ENCOUNTER — Other Ambulatory Visit: Payer: Self-pay

## 2023-11-04 ENCOUNTER — Encounter: Payer: Self-pay | Admitting: Ophthalmology

## 2023-11-04 ENCOUNTER — Other Ambulatory Visit: Payer: Self-pay

## 2023-11-04 ENCOUNTER — Ambulatory Visit: Payer: BLUE CROSS/BLUE SHIELD | Attending: Ophthalmology | Admitting: Ophthalmology

## 2023-11-04 DIAGNOSIS — H36811 Nonproliferative sickle-cell retinopathy, right eye: Secondary | ICD-10-CM

## 2023-11-04 DIAGNOSIS — H36819 Nonproliferative sickle-cell retinopathy, unspecified eye: Secondary | ICD-10-CM | POA: Insufficient documentation

## 2023-11-04 DIAGNOSIS — H0288A Meibomian gland dysfunction right eye, upper and lower eyelids: Secondary | ICD-10-CM | POA: Insufficient documentation

## 2023-11-04 DIAGNOSIS — H5213 Myopia, bilateral: Secondary | ICD-10-CM | POA: Insufficient documentation

## 2023-11-04 DIAGNOSIS — H0288B Meibomian gland dysfunction left eye, upper and lower eyelids: Secondary | ICD-10-CM | POA: Insufficient documentation

## 2023-11-04 DIAGNOSIS — H52223 Regular astigmatism, bilateral: Secondary | ICD-10-CM | POA: Insufficient documentation

## 2023-11-04 NOTE — Progress Notes (Signed)
Outpatient Visit      Patient name: Cameron [name]  DOB: Aug 28, 1996       Age: 28 y.o.  MR#: [id number]    Encounter Date: 11/04/2023      Assessment/Plan:      Meibomian gland dysfunction (MGD), bilateral, both upper and lower lids (Primary)  Asymptomatic, moderate signs  - Meibomian gland dysfunction can cause chronic dry eyes and inflammation. Recommend warm compress for 5-10 minutes (such as Bruder Mask) with gentle lid massage once a day to improve and prevent chronic symptoms.      Myopia of both eyes with regular astigmatism  - New glasses prescription finalized     Sickle cell, low suspicion for non proliferative retinopathy right eye, no retinopathy left eye  Salmon colored patch at subretinal/choroidal level rather than superficial level, less likely arteriolar occlusion. No other retinal findings  - recommend establish care with retina in 4 months and to monitor for changes,       4 months retina       Subjective:     Chief Complaint   Patient presents with    Blurred Vision     CEE    Sicle cell disease       HPI     Blurred Vision     Additional comments: CEE           Comments    Cameron [name] is a 28 y.o. male presenting for an annual eye exam   today to follow up on sickle cell disease. 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  Has glasses, but feels they do not fit correctly on his face.  Rx is 59/27   years old.  No floaters or flashes.  Naphtali mentions his eyes feeling "heavy" from time to time.    Ocular meds: None    Med Hx:  Sickle cell                 Last edited by Loura Back, COA on 11/04/2023  1:18 PM.        has a current medication list which includes the following prescription(s): oxycodone, folic acid, ferrous sulfate, trazodone, ezy dose pill cutter, naloxone, ergocalciferol, and mesalamine.     has No Known Allergies (drug, envir, food or latex).       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      Past Surgical History:   Procedure Laterality Date    CHOLECYSTECTOMY  2014        Specialty Problems    None       ROS    Positive for: Eyes, Heme/Lymph  Negative for: Constitutional, Gastrointestinal, Neurological, Skin,   Genitourinary, Musculoskeletal, HENT, Endocrine, Cardiovascular,   Respiratory, Psychiatric, Allergic/Imm  Last edited by Loura Back, COA on 11/04/2023  1:18 PM.         Objective:     Base Eye Exam       Visual Acuity (Snellen - Linear)         Right Left Both    Dist cc 20/20 slow -2 20/25 -2 20/20      Correction: Glasses              Tonometry (iCare tonometry (ICT), 1:28 PM)  Right Left    Pressure 15 15              Pupils         Dark Light Shape React APD    Right 4 3 Round Brisk None    Left 4 3 Round Brisk None              Visual Fields (Counting fingers)         Left Right     Full Full              Extraocular Movement         Right Left     Full Full              Neuro/Psych       Oriented x3: Yes    Mood/Affect: Normal              Dilation       Both eyes: 1.0% Tropicamide @ 1:26 PM                  Slit Lamp and Fundus Exam       External Exam         Right Left    External Normal ocular adnexae, lacrimal gland & drainage, orbits Normal ocular adnexae, lacrimal gland & drainage, orbits              Slit Lamp Exam         Right Left    Lids/Lashes 2+ capped glands 2+ capped glands    Conjunctiva/Sclera Normal bulbar/palpebral, conjunctiva, sclera Normal bulbar/palpebral, conjunctiva, sclera    Cornea Normal epithelium, stroma, endothelium, tear film Normal epithelium, stroma, endothelium, tear film    Anterior Chamber Clear & deep Clear & deep    Iris Normal shape, size, morphology Normal shape, size, morphology    Lens Normal cortex, nucleus, anterior/posterior capsule, clarity Normal cortex, nucleus, anterior/posterior capsule, clarity              Fundus  Exam         Right Left    Vitreous Clear Clear    Disc Normal size, appearance, nerve fiber layer Normal size, appearance, nerve fiber layer    C/D Ratio 0.45 0.45    Macula Normal Normal    Vessels normal normal    Periphery 1DD salmon colored subretinal/choroidal patch superiorly ~11', no other NV no NV, intact                  Refraction       Wearing Rx         Sphere Cylinder Axis    Right -1.25 -0.75 016    Left -0.75 -1.00 159      [age]: [age]yrs    Type: SVL              Manifest Refraction         Sphere Cylinder Axis Dist VA    Right -1.50 -0.75 016 20/20    Left -1.00 -1.00 158 20/25   MRX by ARD             Cycloplegic Refraction         Sphere Cylinder Axis Dist VA    Right -1.50 -0.75 016 20/20    Left -1.25 -1.25 159 20/20-              Final Rx         Sphere Cylinder Axis  Right -1.50 -0.75 016    Left -1.00 -1.00 158      Expiration Date: 11/03/2025                  Final Rx         Sphere Cylinder Axis    Right -1.50 -0.75 016    Left -1.00 -1.00 158      Expiration Date: 11/03/2025

## 2023-11-13 ENCOUNTER — Other Ambulatory Visit: Payer: Self-pay | Admitting: Primary Care

## 2023-11-13 NOTE — Telephone Encounter (Signed)
Patient Demographic Information [facility])       [facility] First Name Last Name Birth Date Gender Street Address [facility] Zip Code   [name] [birth year] Male [address] [place] [address]   [name] 1996-05-20 Male [address] [place] [address]   Search:  [facility] My Rx Current Rx Drug Type Rx Written Rx Dispensed Drug Quantity Days Supply Prescriber Name Prescriber [facility] # Payment Method Dispenser   [name] 10/30/2023 11/02/2023 oxycodone hcl (ir) 10 mg tab 84 14 [name] MD [id number] Other Cameron [name] 10/18/2023 10/19/2023 oxycodone hcl (ir) 10 mg tab 84 14 [name] MD [id number] Other Cameron [name] 10/03/2023 10/05/2023 oxycodone hcl (ir) 10 mg tab [address] Cameron [name] [id number] Other Cameron [name]   B Cameron [name] 09/19/2023 09/21/2023 oxycodone hcl (ir) 10 mg tab 84 14 [name] MD [id number] Other Cameron [name] 09/05/2023 09/07/2023 oxycodone hcl (ir) 10 mg tab 84 14 [name] MD [id number] Other Cameron [name] 08/21/2023 08/24/2023 oxycodone hcl (ir) 10 mg tab 84 14 [name] MD [id number] Other Cameron [name] 08/09/2023 08/10/2023 oxycodone hcl (ir) 10 mg tab [address] Cameron [name] [id number] Other Cameron [name]   B Cameron [name] 07/26/2023 07/27/2023 oxycodone hcl (ir) 10 mg tab 84 14 [name] MD [id number] Other Cameron [name]   Cameron [name] 07/12/2023 07/13/2023 oxycodone hcl (ir) 10 mg tab 84 14 [name] MD [id number] Other Cameron [name] 06/28/2023 06/29/2023 oxycodone hcl (ir) 10 mg tab 84 14 [name] MD [id number] Other Cameron [name] 06/15/2023 06/15/2023 oxycodone hcl (ir) 10 mg tab 84 14 [name] MD [id number] Other Cameron [name]   Cameron [name] 05/29/2023 06/01/2023 oxycodone  hcl (ir) 10 mg tab [address] Cameron [name] [id number] Other Cameron [name]   B Cameron [name] 05/17/2023 05/18/2023 oxycodone hcl (ir) 10 mg tab 84 14 [name] MD [id number] Other Cameron [name] 05/04/2023 05/04/2023 oxycodone hcl (ir) 10 mg tab [address] Cameron [name] Other Cameron [name]   B Cameron [name] 04/18/2023 04/20/2023 oxycodone hcl (ir) 10 mg tab 84 14 [name] MD [id number] Other Cameron [name]   Cameron [name] 04/04/2023 04/06/2023 oxycodone hcl (ir) 10 mg tab 84 14 [name] MD [id number] Other Cameron [name] 03/23/2023 03/23/2023 oxycodone hcl (ir) 10 mg tab 84 14 [name] [id number] Other Cameron [name]   [name] 03/08/2023 03/09/2023 oxycodone hcl (ir) 10 mg tab 84 14 [name] MD [id number] Other Cameron [name] 02/23/2023 02/23/2023 oxycodone hcl (ir) 10 mg tab 84 14 [name] MD [id number] Other Cameron [name] 02/07/2023 02/09/2023 oxycodone hcl (ir) 10 mg  tab [address] Cameron [name] [id number] Other Cameron [name]   [name] 01/24/2023 01/26/2023 oxycodone hcl (ir) 10 mg tab 84 14 [name] [id number] Other Cameron [name]   [name] 01/10/2023 01/12/2023 oxycodone hcl (ir) 10 mg tab [address] [name] MD [id number] Other Cameron [name]   [name] 12/26/2022 12/29/2022 oxycodone hcl (ir) 10 mg tab [address] [name] MD [id number] Other Cameron [name]   [name] 12/14/2022 12/15/2022 oxycodone hcl (ir) 10 mg tab [address] [name] MD [id number] Other Cameron [name]   [name] 11/30/2022 12/01/2022 oxycodone hcl (ir) 10 mg tab [address] [name] MD [id number] Other Cameron [name]   [name] 11/15/2022 11/17/2022 oxycodone hcl (ir) 10 mg tab [address] [name] MD [id number] Other Cameron  [name]

## 2023-11-15 ENCOUNTER — Encounter: Payer: Self-pay | Admitting: Primary Care

## 2023-11-15 ENCOUNTER — Other Ambulatory Visit: Payer: Self-pay

## 2023-11-15 MED ORDER — OXYCODONE HCL 10 MG PO TABS *I*
10.0000 mg | ORAL_TABLET | ORAL | 0 refills | Status: DC | PRN
Start: 2023-11-15 — End: 2023-11-28
  Filled 2023-11-15: qty 84, 14d supply, fill #0

## 2023-11-15 NOTE — Telephone Encounter (Signed)
 [facility]  Prior Authorization Request     Date of request: 11/15/2023  Time of call: 4:06 PM     Medication Requiring PA: oxyCODONE (ROXICODONE) 10 mg immediate release tablet     Directions(dosing and frequency): Take 1-1.5 tablets (10-15 mg total) by mouth every 4 hours as needed for Pain Max daily dose: 90 mg (9 tablets)     Quantity/ Day Supply: 63 Tablets     Dx code: D57.1     Request received from:[facility] PA Source: Pharmacy     Name and Phone Number of Pharmacy: [facility] [phone]     Additional Pharmacy Benefit plan: no     Secondary insurance: No     Please run secondary if not covered under primary: no     Insurance coverage confirmed: yes     Covered Alternative Medication:      Tax ID: [id number]     Provider and NPIWindell Moulding- [id number]     Additional Justification:n/a     Key ID:      Please provide justification for PA submission & urgency or indicate which covered alternative was ordered     This submission is for continuation of pain management therapy on which the patient has been stable and prevents hospitalization for the manifestations of underlying sickle cell disease.  Per the CDC statement in April 2019 opioid therapy is clinically indicated in the care of patients with sickle cell disease and their recommendations should NOT be utilized to deny these patients access to opioid therapy.  Lack of access to this medication will result in increased hospital utilization, increase risk of significant morbidity and mortality.

## 2023-11-16 ENCOUNTER — Other Ambulatory Visit: Payer: Self-pay

## 2023-11-28 ENCOUNTER — Other Ambulatory Visit: Payer: Self-pay | Admitting: Primary Care

## 2023-11-28 ENCOUNTER — Other Ambulatory Visit: Payer: Self-pay

## 2023-11-28 MED ORDER — OXYCODONE HCL 10 MG PO TABS *I*
10.0000 mg | ORAL_TABLET | ORAL | 0 refills | Status: DC | PRN
Start: 2023-11-28 — End: 2023-12-13
  Filled 2023-11-28: qty 84, 14d supply, fill #0

## 2023-11-28 NOTE — Telephone Encounter (Signed)
 Patient Demographic Information [facility])  [facility] First Name Last Name Birth Date Gender Street Address [facility] Zip Code   [name] [birth year] Male [address] [place] [address]   Search:  PDI My Rx Current Rx Drug Type Rx Written Rx Dispensed Drug Quantity Days Supply Prescriber Name Prescriber [facility] # Payment Method Dispenser   Cameron [name] 11/15/2023 11/16/2023 oxycodone hcl (ir) 10 mg tab 84 14 Cameron Cellar MD [id number] Other The Cameron [name] 10/30/2023 11/02/2023 oxycodone hcl (ir) 10 mg tab 84 14 Cameron Cellar MD [id number] Other The Cameron [name] 10/18/2023 10/19/2023 oxycodone hcl (ir) 10 mg tab 84 14 Cameron Cellar MD [id number] Other The Cameron [name] 10/03/2023 10/05/2023 oxycodone hcl (ir) 10 mg tab [address] [name] [id number] Other The Cameron [name] [facility]   Cameron U [name] 09/19/2023 09/21/2023 oxycodone hcl (ir) 10 mg tab 84 14 Cameron Cellar MD [id number] Other The Cameron [name] 09/05/2023 09/07/2023 oxycodone hcl (ir) 10 mg tab 84 14 Cameron Cellar MD [id number] Other The Cameron [name] 08/21/2023 08/24/2023 oxycodone hcl (ir) 10 mg tab 84 14 Cameron Cellar MD [id number] Other The Cameron [name] [facility]   Cameron [name] 08/09/2023 08/10/2023 oxycodone hcl (ir) 10 mg tab [address] [name] [id number] Other [facility] Cameron [name] [facility]   Cameron U [name] 07/26/2023 07/27/2023 oxycodone hcl (ir) 10 mg tab 84 14 Cameron Cellar MD [id number] Other The Cameron [name] [facility]   Cameron [name] 07/12/2023 07/13/2023 oxycodone hcl (ir) 10 mg tab 84 14 Cameron Cellar MD [id number] Other The Cameron [name] [facility]   Cameron [name] 06/28/2023 06/29/2023 oxycodone hcl (ir) 10 mg tab 84 14 Cameron Cellar MD [id number] Other The Cameron [name] 06/15/2023 06/15/2023 oxycodone hcl (ir) 10 mg tab 84 14 Cameron Cellar MD [id number] Other The  [name]   Cameron [name] 05/29/2023 06/01/2023 oxycodone hcl (ir) 10 mg tab [address] [name] [id number] Other The Cameron [name] [facility]   Cameron U [name] 05/17/2023 05/18/2023 oxycodone hcl (ir) 10 mg tab 84 14 Cameron Cellar MD [id number] Other The Cameron [name] 05/04/2023 05/04/2023 oxycodone hcl (ir) 10 mg tab [address] Glean Hess [id number] Other The Cameron [name] [facility]   Cameron U [name] 04/18/2023 04/20/2023 oxycodone hcl (ir) 10 mg tab 84 14 Cameron Cellar MD [id number] Other The Cameron [name] 04/04/2023 04/06/2023 oxycodone hcl (ir) 10 mg tab 84 14 Cameron Cellar MD [id number] Other The Cameron [name] 03/23/2023 03/23/2023 oxycodone hcl (ir) 10 mg tab 84 14 [name] [id number] Other The Cameron [name] [facility]   Cameron Cameron [name] 03/08/2023 03/09/2023 oxycodone hcl (ir) 10 mg tab 84 14 Cameron Cellar MD [id number] Other The Cameron [name] 02/23/2023 02/23/2023 oxycodone hcl (ir) 10 mg tab 84 14 Cameron Cellar MD [id number] Other The Cameron [name] [facility]   Cameron U  [name] 02/07/2023 02/09/2023 oxycodone hcl (ir) 10 mg tab 84 14 Cameron [name] [id number] Other The Cameron [name] [facility]   [name] 01/24/2023 01/26/2023 oxycodone hcl (ir) 10 mg tab 84 14 [name] [id number] Other The Cameron [name] [facility]   [name] 01/10/2023 01/12/2023 oxycodone hcl (ir) 10 mg tab [address] Cameron Cellar MD [id number] Other The Cameron [name] [facility]   [name] 12/26/2022 12/29/2022 oxycodone hcl (ir) 10 mg tab [address] Cameron Cellar MD [id number] Other The Cameron [name] [facility]   [name] 12/14/2022 12/15/2022 oxycodone hcl (ir) 10 mg tab [address] Cameron Cellar MD [id number] Other The Cameron [name] [facility]   [name] 11/30/2022 12/01/2022 oxycodone hcl (ir) 10 mg tab [address] Cameron Cellar MD [id number] Other The Cameron [name]

## 2023-11-30 ENCOUNTER — Other Ambulatory Visit: Payer: Self-pay

## 2023-12-11 ENCOUNTER — Other Ambulatory Visit: Payer: Self-pay | Admitting: Primary Care

## 2023-12-12 ENCOUNTER — Telehealth: Payer: Self-pay | Admitting: Primary Care

## 2023-12-12 NOTE — Telephone Encounter (Signed)
 Writer has sent an email to [facility] to see if patient can get an urgent dental appointment, since he is a [facility] patient.

## 2023-12-12 NOTE — Telephone Encounter (Signed)
 [facility] TELEPHONE TRIAGE     Reason for call: Patient relayed patient has an urgent concern. Patient requests to be direct transferred to a nurse.     Name of caller: Skylan Lara III  Phone: [phone]

## 2023-12-12 NOTE — Telephone Encounter (Signed)
 Patient Demographic Information [facility])       [place] First Name Last Name Birth Date Gender Street Address [facility] Zip Code   [name] [birth year] Male [address] [place] [address]   [name] 12/05/1995 Male [address] [place] [address]   Search:  PDI My Rx Current Rx Drug Type Rx Written Rx Dispensed Drug Quantity Days Supply Prescriber Name Prescriber [facility] # Payment Method Dispenser   [name] 11/28/2023 11/30/2023 oxycodone hcl (ir) 10 mg tab 84 14 [name] MD [id number] Other [facility]   [name] 11/15/2023 11/16/2023 oxycodone hcl (ir) 10 mg tab 84 14 [name] MD [id number] Other [facility]   [name] 10/30/2023 11/02/2023 oxycodone hcl (ir) 10 mg tab 84 14 [name] MD [id number] Other [facility]   [name] 10/18/2023 10/19/2023 oxycodone hcl (ir) 10 mg tab 84 14 [name] MD [id number] Other [facility]   [name] 10/03/2023 10/05/2023 oxycodone hcl (ir) 10 mg tab [address] [name] [id number] Other [facility]   [name] 09/19/2023 09/21/2023 oxycodone hcl (ir) 10 mg tab 84 14 [name] MD [id number] Other [facility]   [name] 09/05/2023 09/07/2023 oxycodone hcl (ir) 10 mg tab 84 14 [name] MD [id number] Other [facility]   [name] 08/21/2023 08/24/2023 oxycodone hcl (ir) 10 mg tab 84 14 [name] MD [id number] Other [facility]   [name] 08/09/2023 08/10/2023 oxycodone hcl (ir) 10 mg tab [address] [name] [id number] Other [facility]   [name] 07/26/2023 07/27/2023 oxycodone hcl (ir) 10 mg tab 84 14 [name] MD [id number] Other [facility]   [name] 07/12/2023 07/13/2023 oxycodone hcl (ir) 10 mg tab 84 14 [name] MD [id number] Other [facility]   [name] 06/28/2023 06/29/2023 oxycodone  hcl (ir) 10 mg tab 84 14 [name] MD [id number] Other [facility]   [name] 06/15/2023 06/15/2023 oxycodone hcl (ir) 10 mg tab 84 14 [name] MD [id number] Other [facility]   [name] 05/29/2023 06/01/2023 oxycodone hcl (ir) 10 mg tab [address] [name] [id number] Other [facility]   [name] 05/17/2023 05/18/2023 oxycodone hcl (ir) 10 mg tab 84 14 [name] MD [id number] Other [facility]   [name] 05/04/2023 05/04/2023 oxycodone hcl (ir) 10 mg tab [address] Glean Hess [id number] Other [facility]   [name] 04/18/2023 04/20/2023 oxycodone hcl (ir) 10 mg tab 84 14 [name] MD [id number] Other [facility]   [name] 04/04/2023 04/06/2023 oxycodone hcl (ir) 10 mg tab 84 14 [name] MD [id number] Other [facility]   [name] 03/23/2023 03/23/2023 oxycodone hcl (ir) 10 mg tab 84 14 Cameron [name] [id number] Other [facility]   [name] 03/08/2023 03/09/2023 oxycodone hcl (ir) 10 mg  tab [address] [name] MD [id number] Other [facility]   [name] 02/23/2023 02/23/2023 oxycodone hcl (ir) 10 mg tab [address] [name] MD [id number] Other [facility]   [name] 02/07/2023 02/09/2023 oxycodone hcl (ir) 10 mg tab 84 14 Cameron [name] [id number] Other [facility]   [name] 01/24/2023 01/26/2023 oxycodone hcl (ir) 10 mg tab 84 14 Cameron [name] [id number] Other [facility]   [name] 01/10/2023 01/12/2023 oxycodone hcl (ir) 10 mg tab [address] [name] MD [id number] Other [name] 12/26/2022 12/29/2022 oxycodone hcl (ir) 10 mg tab [address] [name] MD [id number] Other [name] 12/14/2022 12/15/2022 oxycodone hcl (ir) 10 mg tab [address] [name] MD [id number] Other The  [facility]

## 2023-12-12 NOTE — Telephone Encounter (Signed)
 Cameron [name] has a 2 PM slot open  If not he should go to [facility]  If not Friday then Monday in [facility] clinic or my schedule

## 2023-12-12 NOTE — Telephone Encounter (Signed)
 Direct transfer from [facility]  Pt reporting headaches on and off for 2 weeks now  These headaches come and go  He is also having simultaneous tooth pain on the left side of his mouth  Has tried both advil and pain medication (hasn't helped)   Denies nausea, double or blurry vision  Has had some sensitivity to light   Has tried to establish care with several dentist offices, states he cannot find anyone who is accepting new pts     Will route to provider to advise

## 2023-12-13 ENCOUNTER — Other Ambulatory Visit: Payer: Self-pay

## 2023-12-13 ENCOUNTER — Ambulatory Visit: Attending: Primary Care

## 2023-12-13 VITALS — BP 126/65 | HR 86 | Temp 96.6°F | Wt 188.4 lb

## 2023-12-13 DIAGNOSIS — G43909 Migraine, unspecified, not intractable, without status migrainosus: Secondary | ICD-10-CM

## 2023-12-13 DIAGNOSIS — D571 Sickle-cell disease without crisis: Secondary | ICD-10-CM

## 2023-12-13 MED ORDER — OXYCODONE HCL 10 MG PO TABS *I*
10.0000 mg | ORAL_TABLET | ORAL | 0 refills | Status: DC | PRN
Start: 2023-12-13 — End: 2023-12-25
  Filled 2023-12-13: qty 84, 14d supply, fill #0

## 2023-12-13 MED ORDER — NALOXONE HCL 4 MG/0.1ML NA LIQD *I*
NASAL | 5 refills | Status: DC
Start: 2023-12-13 — End: 2024-02-26
  Filled 2023-12-13: qty 2, 1d supply, fill #0

## 2023-12-13 MED ORDER — SUMATRIPTAN SUCCINATE 100 MG PO TABS *I*
100.0000 mg | ORAL_TABLET | ORAL | 5 refills | Status: DC | PRN
Start: 2023-12-13 — End: 2024-08-03
  Filled 2023-12-13: qty 9, 15d supply, fill #0
  Filled 2024-01-08: qty 9, 15d supply, fill #1
  Filled 2024-07-09: qty 9, 15d supply, fill #2

## 2023-12-13 NOTE — Telephone Encounter (Signed)
 Patient called back and was scheduled for 4 pm today.

## 2023-12-13 NOTE — Progress Notes (Signed)
 [facility]    Visit Note     REASON FOR VISIT      Chief Complaint   Patient presents with    Follow-up         PRIMARY DIAGNOSIS      Headaches    Subjective     SUBJECTIVE      Headaches  Sickle Cell Disease   Headaches for a few weeks   Come and go   Middle   Light makes it worse   No sensitivity to sounds  Has been taking advil, tylenol and it's not helping   If it does work it works for a few hours  Not able to sleep at night  No nausea or vomiting  Feels like his eyes are blurry   Left side tingling feeling   No dental pain   No chest pain or shortness of breath           ROS  Review of Systems as per HPI above    Past Medical History, Social History, Family History, and Medications/allergies reviewed during this visit    Current Outpatient Medications   Medication    folic acid (FOLVITE) 1 mg tablet    ferrous sulfate 325 (65 FE) mg tablet    ergocalciferol 50,000 unit capsule    SUMAtriptan (IMITREX) 100 mg tablet    naloxone (NARCAN) 4 mg/0.1 mL nasal spray    oxyCODONE (ROXICODONE) 10 mg immediate release tablet    traZODone (DESYREL) 50 mg tablet    Misc. Devices (EZY DOSE PILL CUTTER) MISC    mesalamine (ASACOL HD) 800 mg tablet     No current facility-administered medications for this visit.          Objective     OBJECTIVE      BP 126/65   Pulse 86   Temp 35.9 C (96.6 F) (Temporal)   Wt 85.5 kg (188 lb 6.4 oz)   SpO2 100% Comment: room air  BMI 24.86 kg/m     Physical Exam           ASSESSMENT / DIAGNOSIS     1. Sickle cell disease  Refill for oxycodone sent to pharmacy   Narcan sent to pharmacy- never picked it up perviously       2. Migraine, unspecified, not intractable, without status migrainosus  Sumatriptan sent to pharmacy- he has taken this and it has worked before  Set up for infusion on Tuesday- can do fluids and pain medication   If headaches continue can consider imaging       Orders Placed This Encounter    SUMAtriptan (IMITREX) 100 mg tablet    naloxone  (NARCAN) 4 mg/0.1 mL nasal spray    oxyCODONE (ROXICODONE) 10 mg immediate release tablet       There are no Patient Instructions on file for this visit.       --Patient instructed to call if symptoms are not improving or worsening  --Follow-up arranged  No follow-ups on file.     I personally spent 25 minutes on the calendar day of the encounter, including pre and post visit work reviewing the EMR and management of this patient.     Electronically signed by Noelle Penner, NP at 4:49 PM on 12/13/2023     [facility], Phone: [phone]

## 2023-12-13 NOTE — Telephone Encounter (Signed)
 Called and left message for pt  Requested a call back to the office   Office number provided   Dorian Pod, [place]  12/13/2023  8:40 AM

## 2023-12-14 ENCOUNTER — Other Ambulatory Visit: Payer: Self-pay

## 2023-12-17 ENCOUNTER — Ambulatory Visit: Admitting: Nurse Practitioner

## 2023-12-17 ENCOUNTER — Telehealth: Payer: Self-pay | Admitting: Primary Care

## 2023-12-17 NOTE — Telephone Encounter (Signed)
 [facility] TELEPHONE TRIAGE     Reason for call: Patient requested to be contacted to reschedule infusion appointment on 3/25.    Name of caller: Daimien Patmon III   Phone:  [phone]

## 2023-12-17 NOTE — Telephone Encounter (Signed)
 Spoke to Cameron [name].  Discussion infusion request needs.  Denies fevers, SOB, CP, limb swelling.  Denies other acute concerns.    Booked into infusion clinic on December 19, 2023  Cameron [name] does not require transportation to be set-up.  Will have mom drive pt.

## 2023-12-18 ENCOUNTER — Other Ambulatory Visit: Payer: Self-pay

## 2023-12-19 ENCOUNTER — Ambulatory Visit: Attending: Primary Care | Admitting: Nurse Practitioner

## 2023-12-19 ENCOUNTER — Ambulatory Visit

## 2023-12-19 VITALS — BP 107/77 | HR 85 | Temp 98.2°F

## 2023-12-19 VITALS — BP 111/68 | HR 75 | Temp 98.2°F

## 2023-12-19 DIAGNOSIS — D564 Hereditary persistence of fetal hemoglobin [HPFH]: Secondary | ICD-10-CM

## 2023-12-19 DIAGNOSIS — R519 Headache, unspecified: Secondary | ICD-10-CM | POA: Insufficient documentation

## 2023-12-19 DIAGNOSIS — K5180 Other ulcerative colitis without complications: Secondary | ICD-10-CM

## 2023-12-19 DIAGNOSIS — D571 Sickle-cell disease without crisis: Secondary | ICD-10-CM | POA: Insufficient documentation

## 2023-12-19 DIAGNOSIS — E611 Iron deficiency: Secondary | ICD-10-CM

## 2023-12-19 DIAGNOSIS — E860 Dehydration: Secondary | ICD-10-CM

## 2023-12-19 LAB — COMPREHENSIVE METABOLIC PANEL
ALT: 174 U/L — ABNORMAL HIGH (ref 0–50)
AST: 149 U/L — ABNORMAL HIGH (ref 0–50)
Albumin: 3.7 g/dL (ref 3.5–5.2)
Alk Phos: 1351 U/L — ABNORMAL HIGH (ref 40–130)
Anion Gap: 12 (ref 7–16)
Bilirubin,Total: 1.7 mg/dL — ABNORMAL HIGH (ref 0.0–1.2)
CO2: 21 mmol/L (ref 20–28)
Calcium: 8.6 mg/dL — ABNORMAL LOW (ref 9.0–10.3)
Chloride: 106 mmol/L (ref 96–108)
Creatinine: 0.69 mg/dL (ref 0.67–1.17)
Glucose: 76 mg/dL (ref 60–99)
Lab: 8 mg/dL (ref 6–20)
Potassium: 4.4 mmol/L (ref 3.3–5.1)
Sodium: 139 mmol/L (ref 133–145)
Total Protein: 8.4 g/dL — ABNORMAL HIGH (ref 6.3–7.7)
eGFR BY CREAT: 130 *

## 2023-12-19 LAB — CRP: CRP: 7 mg/L (ref 0–8)

## 2023-12-19 LAB — CBC
Hematocrit: 24 % — ABNORMAL LOW (ref 37–52)
Hemoglobin: 7.2 g/dL — ABNORMAL LOW (ref 12.0–17.0)
MCV: 64 fL — ABNORMAL LOW (ref 75–100)
Platelets: 317 10*3/uL (ref 150–450)
RBC: 3.7 MIL/uL — ABNORMAL LOW (ref 4.0–6.0)
RDW: 21.3 % — ABNORMAL HIGH (ref 0.0–15.0)
WBC: 9.7 10*3/uL (ref 3.5–11.0)

## 2023-12-19 LAB — RETICULOCYTES
Retic %: 2.9 % — ABNORMAL HIGH (ref 0.7–2.3)
Retic Abs: 107.6 10*3/uL (ref 33.8–124.0)

## 2023-12-19 LAB — SEDIMENTATION RATE, AUTOMATED: Sedimentation Rate: 62 mm/h — ABNORMAL HIGH (ref 0–15)

## 2023-12-19 MED ORDER — HYDROMORPHONE HCL PF 1 MG/ML IJ SOLN *WRAPPED*
INTRAMUSCULAR | Status: AC
Start: 2023-12-19 — End: 2023-12-19
  Filled 2023-12-19: qty 1

## 2023-12-19 MED ORDER — SODIUM CHLORIDE 0.9 % IV BOLUS *I*
1000.0000 mL | Freq: Once | Status: AC
Start: 2023-12-19 — End: 2023-12-19
  Administered 2023-12-19: 1000 mL via INTRAVENOUS

## 2023-12-19 MED ORDER — HYDROMORPHONE HCL PF 1 MG/ML IJ SOLN *WRAPPED*
0.7500 mg | Freq: Once | INTRAMUSCULAR | Status: AC | PRN
Start: 2023-12-19 — End: 2023-12-19
  Administered 2023-12-19: 0.75 mg via INTRAVENOUS

## 2023-12-19 NOTE — Patient Instructions (Signed)
 I have  ordered an MR of your head and some lab work     If your headache suddenly becomes worse or dose not go away you should go directly to the ED

## 2023-12-19 NOTE — Progress Notes (Signed)
 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     Subjective     Per patient over the last month he has had headaches every day on and off throughout most of the day this is new for him he has had no history of headaches  Headache    Onset approximately 1 month ago  Location frontal and on the left side of his head  Quality pounding states pain is 10 out of 10  Previous headaches occasionally had tension headaches but no history of headaches  Aura no    Nausea vomiting positive nausea  Photophobia positive     He states the headaches will occur on and off throughout the day at times it will last an hour or so headaches have woken him up from sleep    Denies any numbness weakness difficulty with word finding dizziness trauma    Of note he does work nights states he usually sleeps fairly well during the day    Eating and drinking normally no recent URI or fever no diarrhea or bloody stool    This does not feel like typical vaso-occlusive pain    Meds tried: Tylenol oxycodone    SUBJECTIVE      HbSS   Pain  Cameron [name] is here for evaluation of acute pain due to sickle cell disease.   Pain started approximately 2 days ago  Pain location: head  Pain Score: 10/10  Home medication regimen:  Long acting: None   Short acting oxycodone 10  mg q4h prn, generally requiring a dose 6x/day-last dose: Yesterday SNRI/TCA therapy: no  GABAergic medication: no  Acetaminophen: Yes  NSAID: Yes  Pain is not controlled with home pain regimen.  Chest pain: No  Dyspnea: No  Light headedness: No  Fever:No    Last lab work 1\61\0960  Disease Modifying therapy: None      ROS  Review of Systems as per HPI above    Past Medical History, Social History, Family History, and Medications/allergies reviewed during this visit    Current Outpatient Medications   Medication    SUMAtriptan (IMITREX) 100 mg  tablet    naloxone (NARCAN) 4 mg/0.1 mL nasal spray    oxyCODONE (ROXICODONE) 10 mg immediate release tablet    folic acid (FOLVITE) 1 mg tablet    ferrous sulfate 325 (65 FE) mg tablet    traZODone (DESYREL) 50 mg tablet    Misc. Devices (EZY DOSE PILL CUTTER) MISC    ergocalciferol 50,000 unit capsule    mesalamine (ASACOL HD) 800 mg tablet     No current facility-administered medications for this visit.          Objective     OBJECTIVE      BP 107/77   Pulse 85   Temp 36.8 C (98.2 F) (Temporal)   SpO2 100%     Physical Exam    General: Alert and oriented x 3 no apparent distress nontoxic-appearing  HEENT head nontender to palpation eyes pupils equal reactive light accommodation EOMs intact oral mucosa moist  Lungs clear to auscultation good air movement throughout no rhonchi or wheezing respirations easy  Cardiac regular S1-S2  Extremities:no edema   Neuroexam:  Cranial nerves II  through XII intact  Sensation intact to light touch  Full range of motion all extremities gait normal    ASSESSMENT / DIAGNOSIS     HbSS   Vaso-occlusive crisis  Patient presenting with severe pain secondary to vaso-occlusive crisis.   They have have not failed home opioid therapy.  Recommend an appointment in our infusion center for urgent administration of intravenous opioids and fluids, which I have ordered and will oversee.  Additional coverage may be provided by our on-site providers. Evidence indicates that use of infusion centers therapy decreases risk of hospitalization.    Orders Placed This Encounter    MR head without contrast    MRA head without contrast    CBC    Sedimentation rate, automated    Comprehensive metabolic panel    C reactive protein    Reticulocytes     Will treat with IV hydration 1 L and low-dose hydromorphone      Headache: I am concerned that patient has new onset headache which she describes as 10 out of 10 and is waking him from sleep differential includes migraine headache tension headache  vaso-occlusive episode also need to consider stroke intracranial bleed and aneurysm    Will check MR of head and MRA of head these were ordered stat  We will check CBC with differential CMP CRP and sed rate  Discussed good sleep habits with patient discussed hydration    Reviewed if symptoms are worsening or he is any concerns she should go to the emergency department         --Patient instructed to call if symptoms are not improving or worsening  --Follow-up arranged  No follow-ups on file.       This visit required high complexity medical decision making due to: IV administered opioid or other controlled medications      Electronically signed by Luciano Cutter, NP , 12/19/2023,NOW@   [facility], Phone: [phone]

## 2023-12-19 NOTE — Progress Notes (Signed)
 Patient arrived at [facility].  VSS. Rates pain as Pain Score: 8/10 on arrival.  24g PIV placed into Left, Arm  Patient tolerated well.   Labs completed.  Confirmed patient has a transportation arranged.    0.9NS infusing per order.   See MAR for further details.    PIV removed.  Site benign; band-aid applied.    Discharge vital signs:  Vitals:    12/19/23 1602   BP: 111/68   Pulse: 75   Temp: 36.8 C (98.2 F)   TempSrc: Temporal   SpO2: 100%     Rates pain at Pain Score: 5/10 at discharge; no further actions indicated.  Vital signs obtained prior to discharge and stable, as above.   Level of alertness (SAS scale): 4 CALM AND COOPERATIVE: Calm, easily arousable, follows commands  Patient was discharged in stable condition without signs of sedation.   Patient driven home via Family/Friend.      Loann Quill, RN

## 2023-12-23 ENCOUNTER — Ambulatory Visit: Payer: Self-pay | Admitting: Nurse Practitioner

## 2023-12-23 DIAGNOSIS — K8301 Primary sclerosing cholangitis: Secondary | ICD-10-CM

## 2023-12-23 DIAGNOSIS — R519 Headache, unspecified: Secondary | ICD-10-CM

## 2023-12-25 ENCOUNTER — Other Ambulatory Visit: Payer: Self-pay

## 2023-12-25 MED ORDER — OXYCODONE HCL 10 MG PO TABS *I*
10.0000 mg | ORAL_TABLET | ORAL | 0 refills | Status: DC | PRN
Start: 2023-12-25 — End: 2024-01-08
  Filled 2023-12-25: qty 84, 14d supply, fill #0

## 2023-12-25 NOTE — Telephone Encounter (Signed)
 Search Terms: Cameron [name], [birth year]  Search Date: 12/25/2023 07:47:22 AM  Searching on behalf of: [id number] - Cameron [name]  The Drug Utilization Report below displays all of the controlled substance prescriptions, if any, that your patient has filled in the last twelve months. The information displayed on this report is compiled from pharmacy submissions to the Department, and accurately reflects the information as submitted by the pharmacies.  This report was requested by: [name]  Reference #: [id number]  Practitioner Count: 3  Pharmacy Count: 1  Current Opioid Prescriptions: 1  Current Benzodiazepine Prescriptions: 0  Current Stimulant Prescriptions: 0        Patient Demographic Information [facility])       [facility] First Name Last Name Birth Date Gender Street Address [facility] Zip Code   [name] [birth year] Male [address] [place] [address]   [name] 12/11/1995 Male [address] [place] [address]        Search:  PDI My Rx Current Rx Drug Type Rx Written Rx Dispensed Drug Quantity Days Supply Prescriber Name Prescriber [facility] # Payment Method Dispenser   A [name] 12/13/2023 12/14/2023 oxycodone hcl (ir) 10 mg tab 84 14 [name] [id number] Other [facility]   A U [name] 11/28/2023 11/30/2023 oxycodone hcl (ir) 10 mg tab 84 14 Cameron Cellar MD [id number] Other [facility]   A [name] 11/15/2023 11/16/2023 oxycodone hcl (ir) 10 mg tab 84 14 Cameron Cellar MD [id number] Other [facility]   A [name] 10/30/2023 11/02/2023 oxycodone hcl (ir) 10 mg tab 84 14 Cameron Cellar MD [id number] Other [facility]   A [name] 10/18/2023 10/19/2023 oxycodone hcl (ir) 10 mg tab 84 14 Cameron Cellar MD [id number] Other [facility]   A [name] 10/03/2023 10/05/2023 oxycodone hcl (ir) 10 mg tab [address] [name] [id number] Other [facility]   A [name] 09/19/2023 09/21/2023  oxycodone hcl (ir) 10 mg tab 84 14 Cameron Cellar MD [id number] Other [facility]   A [name] 09/05/2023 09/07/2023 oxycodone hcl (ir) 10 mg tab 84 14 Cameron Cellar MD [id number] Other [facility]   A [name] 08/21/2023 08/24/2023 oxycodone hcl (ir) 10 mg tab 84 14 Cameron Cellar MD [id number] Other [facility]   A [name] 08/09/2023 08/10/2023 oxycodone hcl (ir) 10 mg tab [address] [name] [id number] Other [facility]   A [name] 07/26/2023 07/27/2023 oxycodone hcl (ir) 10 mg tab 84 14 Cameron Cellar MD [id number] Other [facility]   A [name] 07/12/2023 07/13/2023 oxycodone hcl (ir) 10 mg tab 84 14 Cameron Cellar MD [id number] Other [facility]   A [name] 06/28/2023 06/29/2023 oxycodone hcl (ir) 10 mg tab 84 14 Cameron Cellar MD [id number] Other [facility]   A [name] 06/15/2023 06/15/2023 oxycodone hcl (ir) 10 mg tab 84 14 Cameron Cellar MD [id number] Other [facility]   A [name] 05/29/2023 06/01/2023 oxycodone hcl (ir) 10 mg tab [address] [name] [id number] Other [facility]   A  U [name] 05/17/2023 05/18/2023 oxycodone hcl (ir) 10 mg tab 84 14 Cameron Cellar MD [id number] Other [facility]   A [name] 05/04/2023 05/04/2023 oxycodone hcl (ir) 10 mg tab [address] Glean Hess [id number] Other [facility]   A [name] 04/18/2023 04/20/2023 oxycodone hcl (ir) 10 mg tab 84 14 Cameron Cellar MD [id number] Other [facility]   A [name] 04/04/2023 04/06/2023 oxycodone hcl (ir) 10 mg tab [address] Cameron Cellar MD [id number] Other [facility]   A [name] 03/23/2023 03/23/2023 oxycodone hcl (ir) 10 mg tab 84 14 [name] [id number] Other [facility]   A U [name] 03/08/2023 03/09/2023 oxycodone hcl (ir) 10 mg tab [address] Cameron Cellar MD  [id number] Other [facility]   A [name] 02/23/2023 02/23/2023 oxycodone hcl (ir) 10 mg tab [address] Cameron Cellar MD [id number] Other [facility]   A [name] 02/07/2023 02/09/2023 oxycodone hcl (ir) 10 mg tab 84 14 [name] [id number] Other [facility]   [name] 01/24/2023 01/26/2023 oxycodone hcl (ir) 10 mg tab 84 14 [name] [id number] Other [facility]   [name] 01/10/2023 01/12/2023 oxycodone hcl (ir) 10 mg tab [address] Cameron Cellar MD [id number] Other [name] 12/26/2022 12/29/2022 oxycodone hcl (ir) 10 mg tab [address] Cameron Cellar MD [id number] Other [facility]

## 2023-12-26 ENCOUNTER — Ambulatory Visit: Admitting: Sleep Medicine

## 2023-12-27 ENCOUNTER — Encounter: Payer: Self-pay | Admitting: Primary Care

## 2023-12-28 ENCOUNTER — Other Ambulatory Visit: Payer: Self-pay

## 2024-01-08 ENCOUNTER — Ambulatory Visit
Admission: RE | Admit: 2024-01-08 | Discharge: 2024-01-08 | Disposition: A | Discharge status: Home / Self Care | Source: Ambulatory Visit | Attending: Nurse Practitioner | Admitting: Nurse Practitioner

## 2024-01-08 ENCOUNTER — Other Ambulatory Visit: Payer: Self-pay | Admitting: Primary Care

## 2024-01-08 ENCOUNTER — Other Ambulatory Visit: Payer: Self-pay

## 2024-01-08 DIAGNOSIS — R519 Headache, unspecified: Secondary | ICD-10-CM | POA: Insufficient documentation

## 2024-01-08 MED ORDER — OXYCODONE HCL 10 MG PO TABS *I*
10.0000 mg | ORAL_TABLET | ORAL | 0 refills | Status: DC | PRN
Start: 2024-01-08 — End: 2024-01-22
  Filled 2024-01-08: qty 84, 14d supply, fill #0

## 2024-01-08 NOTE — Telephone Encounter (Signed)
 Patient Demographic Information Cameron [name])       [facility] First Name Last Name Birth Date Gender Street Address [facility] Zip Code   [name] 1996/09/08 Male [address] [place] [address]   [name] 05/06/1996 Male [address] [place] [address]   Search:  PDI My Rx Current Rx Drug Type Rx Written Rx Dispensed Drug Quantity Days Supply Prescriber Name Prescriber [facility] # Payment Method Dispenser   [name] 12/25/2023 12/28/2023 oxycodone hcl (ir) 10 mg tab 84 14 [name] MD [id number] Other [name] 12/13/2023 12/14/2023 oxycodone hcl (ir) 10 mg tab 84 14 [name] [id number] Other [name]   [name] 11/28/2023 11/30/2023 oxycodone hcl (ir) 10 mg tab 84 14 [name] MD [id number] Other [name] 11/15/2023 11/16/2023 oxycodone hcl (ir) 10 mg tab 84 14 [name] MD [id number] Other [name] 10/30/2023 11/02/2023 oxycodone hcl (ir) 10 mg tab 84 14 [name] MD [id number] Other [name] 10/18/2023 10/19/2023 oxycodone hcl (ir) 10 mg tab 84 14 [name] MD [id number] Other [name] 10/03/2023 10/05/2023 oxycodone hcl (ir) 10 mg tab [address] [name] [id number] Other [name]   B Cameron [name] 09/19/2023 09/21/2023 oxycodone hcl (ir) 10 mg tab 84 14 [name] MD [id number] Other [name] 09/05/2023 09/07/2023 oxycodone hcl (ir) 10 mg tab 84 14 [name] MD [id number] Other [name] 08/21/2023 08/24/2023 oxycodone hcl (ir) 10 mg tab 84 14 [name] MD [id number] Other [name] 08/09/2023 08/10/2023 oxycodone hcl (ir) 10 mg tab [address] [name] [id number] Other [name]   B Cameron [name] 07/26/2023 07/27/2023 oxycodone hcl  (ir) 10 mg tab 84 14 [name] MD [id number] Other [name]   Cameron [name] 07/12/2023 07/13/2023 oxycodone hcl (ir) 10 mg tab 84 14 [name] MD [id number] Other [name] 06/28/2023 06/29/2023 oxycodone hcl (ir) 10 mg tab 84 14 [name] MD [id number] Other [name] 06/15/2023 06/15/2023 oxycodone hcl (ir) 10 mg tab 84 14 [name] MD [id number] Other [name] 05/29/2023 06/01/2023 oxycodone hcl (ir) 10 mg tab [address] [name] [id number] Other [name]   B Cameron [name] 05/17/2023 05/18/2023 oxycodone hcl (ir) 10 mg tab 84 14 [name] MD [id number] Other [name] 05/04/2023 05/04/2023 oxycodone hcl (ir) 10 mg tab [address] [name] [id number] Other [name]   B Cameron [name] 04/18/2023 04/20/2023 oxycodone hcl (ir) 10 mg tab 84 14 [name] MD [id number] Other [name] 04/04/2023 04/06/2023 oxycodone hcl (ir) 10 mg  tab [address] [name] MD [id number] Other [name]   B Cameron [name] 03/23/2023 03/23/2023 oxycodone hcl (ir) 10 mg tab 84 14 [name] [id number] Other [name]   B Cameron [name] 03/08/2023 03/09/2023 oxycodone hcl (ir) 10 mg tab [address] [name] MD [id number] Other [name] 02/23/2023 02/23/2023 oxycodone hcl (ir) 10 mg tab [address] [name] MD [id number] Other [name] 02/07/2023 02/09/2023 oxycodone hcl (ir) 10 mg tab 84 14 Cameron [name] [id number] Other [name]   [name] 01/24/2023 01/26/2023 oxycodone hcl (ir) 10 mg tab 84 14 [name] [id number] Other [name]   [name] 01/10/2023 01/12/2023 oxycodone hcl (ir) 10 mg tab [address] [name] MD [id number] Other [name]

## 2024-01-11 ENCOUNTER — Other Ambulatory Visit: Payer: Self-pay

## 2024-01-15 ENCOUNTER — Other Ambulatory Visit: Payer: Self-pay | Admitting: Neurology

## 2024-01-15 DIAGNOSIS — R9089 Other abnormal findings on diagnostic imaging of central nervous system: Secondary | ICD-10-CM

## 2024-01-15 NOTE — Telephone Encounter (Signed)
 Reviewed with Dr Arvis Laura will send Neurology e consult

## 2024-01-15 NOTE — Telephone Encounter (Signed)
 Called pt went directly to unidentified VM  Will send [facility]  Delano Feast, NP

## 2024-01-15 NOTE — Provider Consult (Signed)
 Consulting Provider Response     Question:  "pt with hx SCD and worsening headaches see note dated 12/19/2023   MRA head-T1 hyperintensity in the bilateral basal ganglia. This is nonspecific and recommend clinical correlation.    Does this need follow up ?   I suspect headaches related to working nights and stress and if no improvement will refer to headache clinic -just wanted to make sure MRA does not need specific follow up"    Recommendations: Likely related to chronic liver dysfunction.  No further follow-up or surveillance imaging at this time.    Rationale:   MRI/A head personally reviewed.  There are no areas of stroke or vasculopathy seen.  He has symmetric bilateral globus pallidus T1 hyperintensities with associated susceptibility signal changes.  This is suggestive of mineralization.  This finding is not known to be associated with sickle cell disease, but can be seen with hepatic dysfunction (he has a history of primary sclerosing cholangitis and chronically elevated LFTs), Wilson's disease, magnesium deposition (again associated with chronic liver disease), or calcium deposition.    Given the above, this is likely an incidental finding in the setting of his chronic liver disease.  This finding is not related to his headaches.  I would not recommend further follow-up or surveillance imaging at this time.  Consider repeat MRI head if he develops any new neurologic or psychiatric symptoms that could be associated with the above disorders.    Contingency:  None    Time Spent: 8 minutes      This eConsult is focused on the specific clinical question(s) asked by the referring clinician, is based on the clinical data available to me, the consulting physician at the time of the request, and is furnished without benefit of a comprehensive evaluation of physical examination of the patient by me. The guidance set forth in the eConsult note will need to be interpreted in light of any clinical issues not known to me  or any changes in patient status that I may not be aware of at the time of filing this eConsult. If further consultation is necessary, an in-person visit with me or another member of our group is an option.

## 2024-01-16 NOTE — Telephone Encounter (Signed)
 Reviewed MRI in detail with patient all questions were answered  He states he has not really had a headache since last time I saw him during infusion we discussed a good sleep schedule he does work nights and I suspect that may be contributing to his headaches increasing fluids if headaches are worsening or returning he will let me know      We did discuss with his history of ulcerative colitis and primary sclerosing cholangitis he should have a gastroenterologist he had missed several appointments with them reviewed that he needs to keep those appointments and it is important that he have a gastroenterologist he is agreeable referral made  Cameron Feast, NP

## 2024-01-20 ENCOUNTER — Other Ambulatory Visit: Admitting: Nurse Practitioner

## 2024-01-20 NOTE — Progress Notes (Signed)
Ordering Provider Response       See telephone message

## 2024-01-20 NOTE — Progress Notes (Signed)
 Ordering Provider Response       Reviewed with pt see telephone encoutner

## 2024-01-22 ENCOUNTER — Other Ambulatory Visit: Payer: Self-pay | Admitting: Student in an Organized Health Care Education/Training Program

## 2024-01-23 ENCOUNTER — Other Ambulatory Visit: Payer: Self-pay

## 2024-01-23 MED ORDER — OXYCODONE HCL 10 MG PO TABS *I*
10.0000 mg | ORAL_TABLET | ORAL | 0 refills | Status: DC | PRN
Start: 2024-01-23 — End: 2024-02-06
  Filled 2024-01-23: qty 84, 14d supply, fill #0

## 2024-01-23 NOTE — Telephone Encounter (Signed)
 Patient Demographic Information [facility])       Cameron [name] [facility] First Name Last Name Birth Date Gender Street Address [facility] Zip Code   Cameron [name] Jun 07, 1996 Male [address] [place] [address]   Cameron [name] 05-16-1996 Male [address] [place] [address]   Search:  PDI My Rx Current Rx Drug Type Rx Written Rx Dispensed Drug Quantity Days Supply Prescriber Name Prescriber [facility] # Payment Method Dispenser   Cameron RAYMOND CINDERELLA KIDD 01/08/2024 01/11/2024 oxycodone  hcl (ir) 10 mg tab 84 14 Metta Longs [id number] Other [facility]   Cameron [name] 12/25/2023 12/28/2023 oxycodone  hcl (ir) 10 mg tab 84 14 [name] [id number] Other [facility] Richarda KATHEE [name] 12/13/2023 12/14/2023 oxycodone  hcl (ir) 10 mg tab 84 14 Cameron [name] [id number] Other [facility]   Cameron [name] 11/28/2023 11/30/2023 oxycodone  hcl (ir) 10 mg tab 84 14 [name] [id number] Other [facility] KATHEE [name] 11/15/2023 11/16/2023 oxycodone  hcl (ir) 10 mg tab 84 14 [name] [id number] Other [facility] KATHEE [name] 10/30/2023 11/02/2023 oxycodone  hcl (ir) 10 mg tab 84 14 [name] [id number] Other [facility] Richarda KATHEE [name] 10/18/2023 10/19/2023 oxycodone  hcl (ir) 10 mg tab 84 14 [name] [id number] Other [facility] Richarda KATHEE [name] 10/03/2023 10/05/2023 oxycodone  hcl (ir) 10 mg tab [address] [name] [id number] Other [facility]   Cameron Cameron [name] 09/19/2023 09/21/2023 oxycodone  hcl (ir) 10 mg tab 84 14 [name] [id number] Other [facility]a KATHEE [name] 09/05/2023 09/07/2023 oxycodone  hcl (ir) 10 mg tab 84 14 [name] [id number] Other [facility] Richarda KATHEE [name] 08/21/2023 08/24/2023 oxycodone  hcl (ir) 10 mg tab 84 14 [name] [id number] Other [facility] Richarda KATHEE [name] 08/09/2023 08/10/2023 oxycodone  hcl (ir) 10 mg  tab [address] [name] [id number] Other [facility]   Cameron Cameron [name] 07/26/2023 07/27/2023 oxycodone  hcl (ir) 10 mg tab 84 14 [name] [id number] Other [facility] Richarda KATHEE [name] 07/12/2023 07/13/2023 oxycodone  hcl (ir) 10 mg tab 84 14 [name] [id number] Other [facility] Richarda KATHEE [name] 06/28/2023 06/29/2023 oxycodone  hcl (ir) 10 mg tab 84 14 [name] [id number] Other [facility] Richarda KATHEE [name] 06/15/2023 06/15/2023 oxycodone  hcl (ir) 10 mg tab 84 14 [name] [id number] Other [facility] Richarda KATHEE [name] 05/29/2023 06/01/2023 oxycodone  hcl (ir) 10 mg tab [address] [name] [id number] Other [facility]   Cameron Cameron [name] 05/17/2023 05/18/2023 oxycodone  hcl (ir) 10 mg tab 84 14 [name] [id number] Other [facility] Richarda KATHEE [name] 05/04/2023 05/04/2023 oxycodone  hcl (ir) 10 mg tab [address] [name] [id number] Other [facility] Richarda KATHEE [name] 04/18/2023 04/20/2023 oxycodone  hcl (ir) 10 mg tab 84  [address] MD [id number] Other [facility]   [name] 04/04/2023 04/06/2023 oxycodone  hcl (ir) 10 mg tab [address] [name] [id number] Other [facility]a [name] 03/23/2023 03/23/2023 oxycodone  hcl (ir) 10 mg tab 84 14 Lawernce Fallow [id number] Other [facility]   Cameron [name] 03/08/2023 03/09/2023 oxycodone  hcl (ir) 10 mg tab 84 14 [name] [id number] Other [facility] Richarda [name] 02/23/2023 02/23/2023 oxycodone  hcl (ir) 10 mg tab [address] [name] [id number] Other [facility] Richarda [name] 02/07/2023 02/09/2023 oxycodone  hcl (ir) 10 mg tab 84 14 Maggie Fish [id number] Other [facility]   Cameron Cameron [name] 01/24/2023 01/26/2023 oxycodone  hcl (ir) 10 mg tab 84 14 Lawernce Fallow [id number] Other [facility]

## 2024-01-25 ENCOUNTER — Other Ambulatory Visit: Payer: Self-pay

## 2024-02-06 ENCOUNTER — Other Ambulatory Visit: Payer: Self-pay

## 2024-02-06 ENCOUNTER — Other Ambulatory Visit: Payer: Self-pay | Admitting: Primary Care

## 2024-02-06 MED ORDER — OXYCODONE HCL 10 MG PO TABS *I*
10.0000 mg | ORAL_TABLET | ORAL | 0 refills | Status: DC | PRN
Start: 2024-02-06 — End: 2024-02-20
  Filled 2024-02-06: qty 84, 14d supply, fill #0

## 2024-02-06 NOTE — Telephone Encounter (Signed)
 Patient Demographic Information [facility])       [name]   [name] [birth year] Male [address] [place] [address]   Search:  [facility] [name]   [name] 01/23/2024 01/25/2024 oxycodone  hcl (ir) 10 mg tab 84 14 [name] MD [id number] Other [name]   [name] 01/08/2024 01/11/2024 oxycodone  hcl (ir) 10 mg tab 84 14 Ralene Burger [id number] Other [name]   [name] 12/25/2023 12/28/2023 oxycodone  hcl (ir) 10 mg tab 84 14 [name] MD [id number] Other [name]   [name] 12/13/2023 12/14/2023 oxycodone  hcl (ir) 10 mg tab 84 14 [name] [id number] Other [name]   [name] 11/28/2023 11/30/2023 oxycodone  hcl (ir) 10 mg tab 84 14 [name] MD [id number] Other [name]   [name] 11/15/2023 11/16/2023 oxycodone  hcl (ir) 10 mg tab 84 14 [name] MD [id number] Other [name]   [name] 10/30/2023 11/02/2023 oxycodone  hcl (ir) 10 mg tab 84 14 [name] MD [id number] Other [name]   [name] 10/18/2023 10/19/2023 oxycodone  hcl (ir) 10 mg tab 84 14 [name] MD [id number] Other [name]   [name] 10/03/2023 10/05/2023 oxycodone  hcl (ir) 10 mg tab [address] [name] [id number] Other [name]   [name] 09/19/2023 09/21/2023 oxycodone  hcl (ir) 10 mg tab 84 14 [name] MD [id number] Other [name]   [name] 09/05/2023 09/07/2023 oxycodone  hcl (ir) 10 mg tab 84 14 [name] MD [id number] Other [name]   [name] 08/21/2023 08/24/2023 oxycodone  hcl (ir) 10 mg tab 84 14 [name] MD [id number] Other [facility]   [name] 08/09/2023 08/10/2023 oxycodone  hcl (ir) 10 mg tab [address] [name] [id number] Other [name]   [name] 07/26/2023 07/27/2023 oxycodone  hcl (ir) 10 mg tab 84 14 [name] MD [id number] Other [name]   [name] 07/12/2023 07/13/2023 oxycodone  hcl (ir) 10 mg tab 84 14 [name] MD [id number] Other [name]   [name] 06/28/2023 06/29/2023 oxycodone  hcl (ir) 10 mg tab 84 14 [name] MD [id number] Other [name]   [name] 06/15/2023 06/15/2023 oxycodone  hcl (ir) 10 mg tab 84 14 [name] MD [id number] Other [name]   [name] 05/29/2023 06/01/2023 oxycodone  hcl (ir) 10 mg tab [address] [name] [id number] Other [name]   [name] 05/17/2023 05/18/2023 oxycodone  hcl (ir) 10 mg tab 84 14 [name] MD [id number] Other [name]   [name] 05/04/2023 05/04/2023 oxycodone  hcl (ir) 10 mg tab [address] [name] [id number] Other [name]   A  U [name] 04/18/2023 04/20/2023 oxycodone  hcl (ir) 10 mg tab 84 14 [name] MD [id number] Other [name]   [name] 04/04/2023 04/06/2023 oxycodone  hcl (ir) 10 mg tab [address] [name] MD [id number] Other [name]   [name] 03/23/2023 03/23/2023 oxycodone  hcl (ir) 10 mg tab 84 14 [name] [id number] Other [name]   [name] 03/08/2023 03/09/2023 oxycodone  hcl (ir) 10 mg tab 84 14 [name] MD [id number] Other [name]   [name] 02/23/2023 02/23/2023 oxycodone  hcl (ir) 10 mg tab [address] [name] MD [id number] Other [name]   [name] 02/07/2023 02/09/2023 oxycodone  hcl (ir) 10 mg tab 84 14 Hannah Lewis [id number] Other [name]

## 2024-02-08 ENCOUNTER — Other Ambulatory Visit: Payer: Self-pay

## 2024-02-20 ENCOUNTER — Other Ambulatory Visit: Payer: Self-pay | Admitting: Primary Care

## 2024-02-20 ENCOUNTER — Other Ambulatory Visit: Payer: Self-pay

## 2024-02-20 MED ORDER — OXYCODONE HCL 10 MG PO TABS *I*
10.0000 mg | ORAL_TABLET | ORAL | 0 refills | Status: DC | PRN
Start: 2024-02-20 — End: 2024-03-04
  Filled 2024-02-20: qty 84, 14d supply, fill #0

## 2024-02-20 NOTE — Telephone Encounter (Signed)
 Patient Demographic Information [facility])       [facility] First Name Last Name Birth Date Gender Street Address [facility] Zip Code   Cameron [name] 1996-04-27 Male [address] [place] [address]   Search:  [facility] My Rx Current Rx Drug Type Rx Written Rx Dispensed Drug Quantity Days Supply Prescriber Name Prescriber [facility] # Payment Method Dispenser   Cameron [name] 02/06/2024 02/08/2024 oxycodone  hcl (ir) 10 mg tab 84 14 [name] MD [id number] Other [name]   Cameron [name] 01/23/2024 01/25/2024 oxycodone  hcl (ir) 10 mg tab 84 14 [name] MD [id number] Other [name]   Cameron [name] 01/08/2024 01/11/2024 oxycodone  hcl (ir) 10 mg tab 84 14 Ralene Burger [id number] Other [name]   Cameron U [name] 12/25/2023 12/28/2023 oxycodone  hcl (ir) 10 mg tab 84 14 [name] MD [id number] Other [name]   Cameron [name] 12/13/2023 12/14/2023 oxycodone  hcl (ir) 10 mg tab 84 14 [name] [id number] Other [name]   Cameron U [name] 11/28/2023 11/30/2023 oxycodone  hcl (ir) 10 mg tab 84 14 [name] MD [id number] Other [name]   Cameron [name] 11/15/2023 11/16/2023 oxycodone  hcl (ir) 10 mg tab 84 14 [name] MD [id number] Other [name]   Cameron [name] 10/30/2023 11/02/2023 oxycodone  hcl (ir) 10 mg tab 84 14 [name] MD [id number] Other [name]   Cameron [name] 10/18/2023 10/19/2023 oxycodone  hcl (ir) 10 mg tab 84 14 [name] MD [id number] Other [name]   Cameron [name] 10/03/2023 10/05/2023 oxycodone  hcl (ir) 10 mg tab [address] [name] [id number] Other [name]   Cameron [name] 09/19/2023 09/21/2023 oxycodone  hcl (ir) 10 mg tab 84 14 [name] MD [id number] Other [name]   Cameron [name] 09/05/2023 09/07/2023 oxycodone  hcl (ir) 10 mg tab 84 14 [name] MD [id number] Other [facility]   Cameron [name] 08/21/2023 08/24/2023 oxycodone  hcl (ir) 10 mg tab 84 14 [name] MD [id number] Other [name]   Cameron [name] 08/09/2023 08/10/2023 oxycodone  hcl (ir) 10 mg tab [address] [name] [id number] Other [name]   Cameron U [name] 07/26/2023 07/27/2023 oxycodone  hcl (ir) 10 mg tab 84 14 [name] MD [id number] Other [name]   Cameron [name] 07/12/2023 07/13/2023 oxycodone  hcl (ir) 10 mg tab 84 14 [name] MD [id number] Other [name]   Cameron [name] 06/28/2023 06/29/2023 oxycodone  hcl (ir) 10 mg tab 84 14 [name] MD [id number] Other [name]   Cameron [name] 06/15/2023 06/15/2023 oxycodone  hcl (ir) 10 mg tab 84 14 [name] MD [id number] Other [name]   Cameron [name] 05/29/2023 06/01/2023 oxycodone  hcl (ir) 10 mg tab [address] [name] [id number] Other [name]   Cameron [name] 05/17/2023 05/18/2023 oxycodone  hcl (ir) 10 mg tab 84 14 [name] MD [id number] Other [name]  Cameron U [name] 05/04/2023 05/04/2023 oxycodone  hcl (ir) 10 mg tab [address] [name] [id number] Other [name]   Cameron U [name] 04/18/2023 04/20/2023 oxycodone  hcl (ir) 10 mg tab 84 14 [name] MD [id number] Other [name]   Cameron [name] 04/04/2023 04/06/2023 oxycodone  hcl (ir) 10 mg tab [address] [name] MD [id number] Other [name]   Cameron [name] 03/23/2023 03/23/2023 oxycodone  hcl (ir) 10 mg tab 84 14 [name] [id number] Other [name]   Cameron U [name] 03/08/2023 03/09/2023 oxycodone  hcl (ir) 10 mg tab 84 14 [name] MD [id number] Other [name]   Cameron [name] 02/23/2023 02/23/2023 oxycodone  hcl (ir) 10 mg tab [address] [name] MD [id number] Other [name]

## 2024-02-22 ENCOUNTER — Other Ambulatory Visit: Payer: Self-pay

## 2024-02-26 ENCOUNTER — Ambulatory Visit: Attending: Primary Care | Admitting: Primary Care

## 2024-02-26 ENCOUNTER — Other Ambulatory Visit: Payer: Self-pay

## 2024-02-26 ENCOUNTER — Encounter: Payer: Self-pay | Admitting: Primary Care

## 2024-02-26 ENCOUNTER — Telehealth: Payer: Self-pay | Admitting: Primary Care

## 2024-02-26 VITALS — BP 138/80 | HR 105 | Temp 97.1°F | Wt 177.5 lb

## 2024-02-26 DIAGNOSIS — Z20828 Contact with and (suspected) exposure to other viral communicable diseases: Secondary | ICD-10-CM | POA: Insufficient documentation

## 2024-02-26 DIAGNOSIS — J069 Acute upper respiratory infection, unspecified: Secondary | ICD-10-CM | POA: Insufficient documentation

## 2024-02-26 DIAGNOSIS — Z20822 Contact with and (suspected) exposure to covid-19: Secondary | ICD-10-CM | POA: Insufficient documentation

## 2024-02-26 DIAGNOSIS — Z1152 Encounter for screening for COVID-19: Secondary | ICD-10-CM | POA: Insufficient documentation

## 2024-02-26 DIAGNOSIS — K5190 Ulcerative colitis, unspecified, without complications: Secondary | ICD-10-CM | POA: Insufficient documentation

## 2024-02-26 DIAGNOSIS — D571 Sickle-cell disease without crisis: Secondary | ICD-10-CM

## 2024-02-26 MED ORDER — NALOXONE HCL 4 MG/0.1ML NA LIQD *I*
NASAL | 5 refills | Status: AC
Start: 2024-02-26 — End: ?
  Filled 2024-02-26: qty 2, 1d supply, fill #0

## 2024-02-26 MED ORDER — ACETAMINOPHEN 500 MG PO TABS *I*
1000.0000 mg | ORAL_TABLET | Freq: Three times a day (TID) | ORAL | 0 refills | Status: DC | PRN
Start: 2024-02-26 — End: 2024-05-15
  Filled 2024-02-26: qty 100, 16d supply, fill #0

## 2024-02-26 NOTE — Telephone Encounter (Signed)
 Called and spoke with pt  Started having a sore throat yesterday  By the night, he had cough, congestion, feeling warm and body aches  Feeling weak this morning    Booked for an acute visit this afternoon  Asked that pt wear a mask while in the office

## 2024-02-26 NOTE — Progress Notes (Signed)
 [facility]  Visit Note    [facility]  [facility]  [address]  [place], [place] [address]  P: [phone]  F: [phone]     REASON FOR TODAY'S VISIT      Chief Complaint   Patient presents with    Pharyngitis     Sore throat, body aches, fatigue, and cough starting on Monday.     SUBJECTIVE      Patient presents to clinic in person to discuss the following:    URI  Symptoms started Monday 02/24/24  Began with sore throat, now no sore throat  Chills, body aches  No fever  Cough with sputum  No sinus pain  No HA  Yesterday took cold and flu medicine  Modest benefit  Oxy for SCD pain  Not eating or drinking well  Has colitis  Has diarrhea now stooling up to 5 times daily  Usually stools once a day colitis  Urine is dark in color  No vomiting    [facility] Mychart Screening    12/18/2023 10:06 PM EDT - Filed by Patient   Within the past 12 months, you worried that your food would run out before you got the money to buy more. Never true   Within the past 12 months, the food you bought just didn't last and you didn't have money to get more. Never true   In the past 12 months, has lack of transportation kept you from medical appointments or from getting medications? No   In the past 12 months, has lack of transportation kept you from meetings, work, or from getting things needed for daily living? No   In the last 12 months, was there a time when you were not able to pay the mortgage or rent on time? No   In the past 12 months, how many times have you moved where you were living?    In the last 12 months, was there a time when you did not have a steady place to sleep or slept in a shelter (including now)? No   In the past 12 months has the electric, gas, oil, or water  company threatened to shut off services in your home? No       Pertinent Review of Systems as per above.  Past Medical History, Social History, Family History, and Medications/allergies reviewed during this visit.    OBJECTIVE      Vitals:    02/26/24 1514   BP: 138/80   Pulse: 105   Temp: 36.2 C (97.1 F)   TempSrc: Temporal   SpO2: 98%   Weight: 80.5 kg (177 lb 8 oz)      WEIGHTS Weight (metric) Weight (standard)   02/26/2024 80.513 kg 177 lbs 8 oz   01/08/2024 84.8 kg 186 lbs 15 oz     Physical Exam  Constitutional:       General: He is awake. He is not in acute distress.     Appearance: He is not ill-appearing, toxic-appearing or diaphoretic.   HENT:      Right Ear: Tympanic membrane, ear canal and external ear normal. There is no impacted cerumen.      Left Ear: Tympanic membrane, ear canal and external ear normal. There is no impacted cerumen.      Nose: Congestion and rhinorrhea present.      Mouth/Throat:      Mouth: Mucous membranes are moist.      Pharynx: Oropharynx  is clear. No oropharyngeal exudate or posterior oropharyngeal erythema.   Eyes:      General: Scleral icterus (very very slight) present.   Cardiovascular:      Rate and Rhythm: Tachycardia present.   Pulmonary:      Effort: Pulmonary effort is normal. No respiratory distress.      Breath sounds: Normal breath sounds. No stridor. No wheezing or rhonchi.   Abdominal:      General: Bowel sounds are normal. There is no distension.      Palpations: Abdomen is soft. There is no mass.      Tenderness: There is no abdominal tenderness.      Hernia: No hernia is present.   Musculoskeletal:         General: No swelling. Normal range of motion.      Cervical back: Normal range of motion.   Skin:     General: Skin is warm and dry.   Neurological:      Mental Status: He is alert.   Psychiatric:         Behavior: Behavior is cooperative.       ASSESSMENT / DIAGNOSIS     1. Viral URI (Primary)  2. Ulcerative colitis  - COVID/Flu/RSV swab sent  - Window for antiviral therapy closes on 02/29/24  - Start acetaminophen  (TYLENOL ) 500 mg tablet; Take 2 tablets (1,000 mg total) by mouth 3 times daily as needed.  Dispense: 100 tablet; Refill: 0  - Most concerned for dehydration. Recommended  increasing oral hydration ( at least 80 ounces) given diarrhea and weight loss. Patient feels he will be able to hydrate more at home. Let him know that in the event he is unable to hydrate we need to consider sending him to the ED for IV hydration. He relayed understanding of this plan.  - Wrote letter to be excused from work until Saturday (works Thursday to Monday). Can extend for longer if not feeling well enough to return.     3. Sickle cell disease  - Continue Oxycodone  for pain  - naloxone  (NARCAN ) 4 mg/0.1 mL nasal spray; Instill 1 spray in 1 nostril once for opioid reversal. Repeat in alternating nostrils with new package every 2-3 minutes until response  Dispense: 2 each; Refill: 5    -- Shared decision making is an essential part of our care model. The above plan was discussed and made in close collaboration and agreement with patient/caregiver.  -- Side effects of medications prescribed were reviewed and questions/concerns addressed to patient/caregiver's satisfaction.   -- Patient was instructed to contact us  should symptoms worsen or not improve [phone].     I personally spent 25 minutes on the calendar day of the encounter, including pre and post visit work reviewing the EMR and management of this patient.     Johnson Nanny, NP  02/26/24  3:21 PM  [facility]  [address]  [place], [place] [address]  P: [phone]  F: [phone]

## 2024-02-26 NOTE — Patient Instructions (Signed)
 [facility]  [facility]  [address]  [place], [place] [address]  P: [phone]  F: [phone]    TODAY'S VISIT PLAN:   Patient Education   Patient Education     Upper Respiratory Infection ED   General Information   You came to the Emergency Department (ED) for an upper respiratory infection or URI. A URI can affect your nose, throat, ears, and sinuses. A virus is the cause of almost all URIs and antibiotics will not help you feel better more quickly. The common cold is an example of a viral URI.  URIs are easy to spread from person to person, most often through coughing or sneezing. A URI will almost always get better in a week or two without any treatment.  What care is needed at home?   Call your regular doctor to let them know you were in the ED. Make a follow-up appointment if you were told to.  If you smoke, try to quit. Your doctor or nurse can help.  Drink lots of fluids like water , juice, or broth. This will help replace any fluids lost if you have a runny nose or fever. Warm tea or soup can help soothe a sore throat.  If the air in your home feels dry, use a cool mist humidifier. This can help a stuffy nose and make it easier to breathe.  You can also use saline nose drops or spray to relieve stuffiness.  If you decide to take over-the-counter cough or cold medicines, follow the directions on the label carefully. Be sure you do not take more than 1 medicine that contains acetaminophen . Also, if you have a heart problem or high blood pressure, check with your doctor before you take any of these medicines.  Wash your hands often. Cough or sneeze into a tissue or your elbow instead of your hands. When around others, you might also want to wear a face mask. These steps can help keep others around you healthy.  When do I need to get emergency help?   Return to the ED if:   You have trouble breathing when talking or sitting still.  When do I need to call the doctor?   You have a fever of 100.33F  (38C) or higher for several days, chills, a very bad sore throat, or ear or sinus pain.  You develop a new fever after several days of feeling the same or improving.  You develop chest pain when you cough.  You have a cough that lasts more than 10 days.  You cough up blood.  You have new or worsening symptoms.       To send messages and to view your labs, please sign up for MyChart at:   [email or web address]   All patients over age 21 will receive an activation code at the end of each [facility] visit, so you can sign up for MyChart at home when it's convenient for you. You can also request access online, or by contacting our MyChart Customer Service Center 8 a.m. to 4:30 p.m. weekdays: [phone] or [phone] (choose Option 1).    Please do not use MyChart for urgent needs   You should call 911 for any medical emergencies including: Chest pain, shortness of breath, or trouble breathing, a severe headache that does not go away, feeling confused, or having trouble seeing, or bleeding that does not stop or slow down after several minutes.      If you  or a loved one are struggling with a mental health crisis, or a substance use disorder, CALL 988. You will be connected to trained counselors who will listen, provide support, and connect you to local resources. Visit [email or web address].at for more information. For finding a local Mental Health Counselor or Therapist, visit [email or web address]  and enter your zip code.    Please remember to bring an updated medication list to every visit.

## 2024-02-26 NOTE — Telephone Encounter (Signed)
 [facility] TELEPHONE TRIAGE     Reason for call: Patient requests to receive a return call as soon as possible. Patient relayed patient is not feeling well. Patient states patient has developed cold like symptoms and feels weak. Patient requests to speak directly to a nurse.     Name of caller: Cameron [name]  Phone: [phone]

## 2024-02-26 NOTE — Telephone Encounter (Signed)
 Nursing,    Please call to check on him tomorrow.  Viral panel pending.  I am most concerned for dehydration.    Should he not be able to hydrate and or continues having a lot of diarrhea we may need to send him in for IV hydration.    Keep me posted,  Johnson Nanny, NP  02/26/24  3:47 PM  [facility]  [address]  [place], [place] [address]  P: [phone]  F: [phone]

## 2024-02-27 ENCOUNTER — Ambulatory Visit: Payer: Self-pay | Admitting: Primary Care

## 2024-02-27 LAB — COVID/INFLUENZA A & B/RSV NAAT (PCR)
COVID-19 NAAT (PCR): NEGATIVE
Influenza A NAAT (PCR): NEGATIVE
Influenza B NAAT (PCR): NEGATIVE
RSV NAAT (PCR): NEGATIVE

## 2024-02-27 NOTE — Telephone Encounter (Signed)
 Called and spoke with pt  Reports he was able to rest last night, started feeling better today  Has not had diarrhea since 3 am  Able to drink now and keep it down  Still doesn't have much of an appetite  Discussed starting with bland foods for the first 24 hours (gave examples of some foods that would be easy on his stomach)      Writer relayed negative viral panel results  Asked that he reach out to the office if anything changes  No further needs at this time

## 2024-02-27 NOTE — Telephone Encounter (Signed)
 Attempted to call pt  No answer  LVM  Call back number provided    Loann Quill, RN

## 2024-03-04 ENCOUNTER — Other Ambulatory Visit: Payer: Self-pay | Admitting: Primary Care

## 2024-03-04 NOTE — Telephone Encounter (Signed)
 Search Terms: Cameron [name], [birth year]  Search Date: 03/04/2024 14:11:57 PM  Searching on behalf of: Cameron [name] - Cameron [name]  Cameron Drug Utilization Report below displays all of Cameron controlled substance prescriptions, if any, that your patient has filled in Cameron last twelve months. Cameron information displayed on this report is compiled from pharmacy submissions to Cameron Department, and accurately reflects Cameron information as submitted by Cameron pharmacies.  This report was requested by: Cameron [name]  Reference #: [id number]  Practitioner Count: 3  Pharmacy Count: 1  Current Opioid Prescriptions: 1  Current Benzodiazepine Prescriptions: 0  Current Stimulant Prescriptions: 0        Patient Demographic Information [facility])       [facility] First Name Last Name Birth Date Gender Street Address [facility] Zip Code   Cameron [name] [birth year] Male [address] [place] [address]        Search:  [facility] My Rx Current Rx Drug Type Rx Written Rx Dispensed Drug Quantity Days Supply Prescriber Name Prescriber [facility] # Payment Method Dispenser   Cameron [name] 02/20/2024 02/22/2024 oxycodone  hcl (ir) 10 mg tab 84 14 Cameron Dark MD [id number] Other Cameron [name]   Cameron [name] 02/06/2024 02/08/2024 oxycodone  hcl (ir) 10 mg tab 84 14 Cameron Dark MD [id number] Other Cameron [name]   Cameron [name] 01/23/2024 01/25/2024 oxycodone  hcl (ir) 10 mg tab 84 14 Cameron Dark MD [id number] Other Cameron [name]   Cameron [name] 01/08/2024 01/11/2024 oxycodone  hcl (ir) 10 mg tab 84 14 Cameron [name] [id number] Other Cameron [name]   Cameron U [name] 12/25/2023 12/28/2023 oxycodone  hcl (ir) 10 mg tab 84 14 Cameron Dark MD [id number] Other Cameron [name]   Cameron [name] 12/13/2023 12/14/2023 oxycodone  hcl (ir) 10 mg tab 84 14 Cameron [name] [id number] Other Cameron [name]   Cameron [name] 11/28/2023 11/30/2023 oxycodone  hcl (ir) 10 mg tab 84 14 Cameron Dark MD [id number] Other Cameron [name]   Cameron [name] 11/15/2023 11/16/2023 oxycodone  hcl (ir) 10 mg tab 84 14 Cameron Dark MD [id number] Other Cameron [name]   Cameron [name] 10/30/2023 11/02/2023 oxycodone  hcl (ir) 10 mg tab 84 14 Cameron Dark MD [id number] Other Cameron [name]   Cameron [name] 10/18/2023 10/19/2023 oxycodone  hcl (ir) 10 mg tab 84 14 Cameron Dark MD [id number] Other Cameron [name]   Cameron [name] 10/03/2023 10/05/2023 oxycodone  hcl (ir) 10 mg tab [address] Cameron [name] [id number] Other Cameron [name]   Cameron [name] 09/19/2023 09/21/2023 oxycodone  hcl (ir) 10 mg tab 84 14 Cameron Dark MD [id number] Other Cameron [name]   Cameron [name] 09/05/2023 09/07/2023 oxycodone  hcl (ir) 10 mg tab 84 14 Cameron Dark MD [id number] Other Cameron [name]   Cameron [name] 08/21/2023 08/24/2023 oxycodone  hcl (ir) 10 mg tab 84 14 Cameron Dark MD [id number] Other Cameron [name]   Cameron [name] 08/09/2023 08/10/2023 oxycodone  hcl (ir) 10 mg tab [address] Cameron [name] [id number] Other Cameron [name]   Cameron [name] 07/26/2023 07/27/2023 oxycodone  hcl (ir) 10 mg tab 84 14 Cameron Lew M  MD [id number] Other Cameron [name]   Cameron U [name] 07/12/2023 07/13/2023 oxycodone  hcl (ir) 10 mg tab [address] [name] MD [id number] Other Cameron [name]   Cameron [name] 06/28/2023 06/29/2023 oxycodone  hcl (ir) 10 mg tab [address] Cameron Dark MD [id number] Other Cameron [name]   Cameron [name] 06/15/2023 06/15/2023 oxycodone  hcl (ir) 10 mg tab [address] Cameron Dark MD [id number] Other Cameron [name]   Cameron [name] 05/29/2023 06/01/2023 oxycodone  hcl (ir) 10 mg tab [address] Cameron [name] [id number] Other Cameron [name]   Cameron [name] 05/17/2023 05/18/2023 oxycodone  hcl (ir) 10 mg tab 84 14 Cameron Dark MD [id number] Other Cameron [name]   Cameron [name] 05/04/2023 05/04/2023  oxycodone  hcl (ir) 10 mg tab [address] [place] Cameron [name] [id number] Other Cameron [name]   Cameron U [name] 04/18/2023 04/20/2023 oxycodone  hcl (ir) 10 mg tab 84 14 Cameron Dark MD [id number] Other Cameron [name]   Cameron [name] 04/04/2023 04/06/2023 oxycodone  hcl (ir) 10 mg tab [address] Cameron Dark MD [id number] Other Cameron [name]   Cameron [name] 03/23/2023 03/23/2023 oxycodone  hcl (ir) 10 mg tab 84 14 Cameron [name] [id number] Other Cameron [name]   Cameron U [name] 03/08/2023 03/09/2023 oxycodone  hcl (ir) 10 mg tab 84 14 Cameron Dark MD [id number] Other Cameron [name]

## 2024-03-05 ENCOUNTER — Other Ambulatory Visit: Payer: Self-pay

## 2024-03-05 MED ORDER — OXYCODONE HCL 10 MG PO TABS *I*
10.0000 mg | ORAL_TABLET | ORAL | 0 refills | Status: DC | PRN
Start: 2024-03-05 — End: 2024-03-18
  Filled 2024-03-05 (×2): qty 84, 14d supply, fill #0

## 2024-03-06 ENCOUNTER — Other Ambulatory Visit: Payer: Self-pay

## 2024-03-07 ENCOUNTER — Other Ambulatory Visit: Payer: Self-pay

## 2024-03-07 NOTE — Progress Notes (Signed)
 [facility] SOCIAL WORK  PHARMACY FORM     Today's date:  March 07, 2024    Patient Name: Cameron [name]      Medical Record #: [id number]   DOB: 02-09-1996  Patient's Address: [address] [place]              Social Worker: Jesse Moritz, LMSW       Date of Service: March 07, 2024       Funding Source: [place] Medication Assistance Fund  ___________________________________________________________________    Pharmacy Information:  Date/time sent: March 07, 2024     Time needed: ASAP    Patient Location: Outpatient    Medication Pick-up Preference: Patient will pick up at the pharmacy    Pharmacy Contact: Gabe  Social work signature: Jesse Moritz, LMSW  Supervisor/Manager Approval (if indicated):  Date:  (supervisor signature not required for Medicaid pending)

## 2024-03-17 ENCOUNTER — Ambulatory Visit: Payer: Vision Other Private Insurance | Admitting: Ophthalmology

## 2024-03-17 DIAGNOSIS — D5700 Hb-SS disease with crisis, unspecified: Secondary | ICD-10-CM

## 2024-03-17 NOTE — Progress Notes (Signed)
 This encounter was created in error - please disregard.

## 2024-03-17 NOTE — Progress Notes (Deleted)
 Retina Service Note    Referred by Mardy Keene Boatman, NP   PCP: ELSPETH FRET, MD       Patient name: Cameron [name]  Medical Record Number: [id number]  DOB: Oct 24, 1995  Age: 28 y.o.     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    Substance and Sexual Activity    Alcohol use: Never    Drug use: Yes     Types: Marijuana    Sexual activity: Yes     Partners: Female     Birth control/protection: Implant   Social History Narrative    06/09/15- Lives at home with mom, brother (16 yrs, 11 yrs), sister (21 yrs) and nephew (4 yrs).            Ophthalmic medications:  No current outpatient medications on file. (Ophthalmic)       Current Outpatient Medications (Other)   Medication Sig Dispense Refill    oxyCODONE  (ROXICODONE ) 10 mg immediate release tablet Take 1 tablet (10 mg total) by mouth every 4 hours as needed for Sickle Cell Disease Pain. Max daily dose: 6 tablets (60 mg) 84 tablet 0    acetaminophen  (TYLENOL ) 500 mg tablet Take 2 tablets (1,000 mg total) by mouth 3 times daily as needed. 100 tablet 0    naloxone  (NARCAN ) 4 mg/0.1 mL nasal spray Instill 1 spray in 1 nostril once for opioid reversal. Repeat in alternating nostrils with new package every 2-3 minutes until response 2 each 5    SUMAtriptan  (IMITREX ) 100 mg tablet Take 1 tablet (100 mg total) by mouth as needed for Migraine. Take at onset of headache. May repeat once in 2  hours. 9 tablet 5    folic acid  (FOLVITE ) 1 mg tablet TAKE 1 TABLET (1 MG TOTAL) BY MOUTH DAILY 30 tablet 5    ferrous sulfate  325 (65 FE) mg tablet Take 1 tablet (325 mg total) by mouth every other day. With breakfast. 15 tablet 2    traZODone  (DESYREL ) 50 mg tablet Take 0.5 tablets (25 mg total) by mouth at bedtime for [employer]. 15 tablet 5    mesalamine  (ASACOL  HD) 800 mg tablet Take 3 tablets (2,400 mg total) by mouth 2 times daily (Patient not taking: Reported on 12/13/2023) 180 tablet 5       Allergies: Allergies: No Known Allergies (drug, envir, food or latex)        Objective:       There were no vitals filed for this visit.    Not recorded  Ophthalmic Testing and Imaging:           Assessment and Plan:      ***      Follow up: Dr. Breazzano for {choicesfollowup:48234::clinic evaluation} in {0-10 NA:37112} {DAYS/WEEKS/MONTHS:36459} with {Returnvisitchoicesprocedures:48242::Optos and FAF OU, OCT Mac OU}    Letter to Dr. Laurence Anes P. Breazzano, MD, [facility]  Vitreoretinal Physician and Surgeon  [facility] [place]

## 2024-03-18 ENCOUNTER — Other Ambulatory Visit: Payer: Self-pay | Admitting: Primary Care

## 2024-03-18 NOTE — Telephone Encounter (Signed)
 Search Terms: Cameron [name], March 17, 1996  Search Date: 03/18/2024 08:05:03 AM  Searching on behalf of: [id number] - Cameron [name]  The Drug Utilization Report below displays all of the controlled substance prescriptions, if any, that your patient has filled in the last twelve months. The information displayed on this report is compiled from pharmacy submissions to the Department, and accurately reflects the information as submitted by the pharmacies.  This report was requested by: Cameron [name]  Reference #: [id number]  Practitioner Count: 2  Pharmacy Count: 1  Current Opioid Prescriptions: 1  Current Benzodiazepine Prescriptions: 0  Current Stimulant Prescriptions: 0        Patient Demographic Information [facility])       [facility] First Name Last Name Birth Date Gender Street Address [facility] Zip Code   [name] [birth year] Male [address]  [place] [address]        Search:  [facility] My Rx Current Rx Drug Type Rx Written Rx Dispensed Drug Quantity Days Supply Prescriber Name Prescriber [facility] # Payment Method Dispenser   A U Y O 03/05/2024 03/07/2024 oxycodone  hcl (ir) 10 mg tab 84 14 [name] Cameron CHRISTELLA MD [id number] Other [facility]   [name] 02/20/2024 02/22/2024 oxycodone  hcl (ir) 10 mg tab 84 14 [name] Cameron CHRISTELLA MD [id number] Other [facility]   [name] 02/06/2024 02/08/2024 oxycodone  hcl (ir) 10 mg tab 84 14 [name] Cameron CHRISTELLA MD [id number] Other [facility]   [name] 01/23/2024 01/25/2024 oxycodone  hcl (ir) 10 mg tab 84 14 [name] Cameron CHRISTELLA MD [id number] Other [facility]   [name] 01/08/2024 01/11/2024 oxycodone  hcl (ir) 10 mg tab 84 14 Metta Longs [id number] Other [facility]   [name] 12/25/2023 12/28/2023 oxycodone  hcl (ir) 10 mg tab 84 14 [name] Cameron CHRISTELLA MD [id number] Other [facility]   [name] 12/13/2023 12/14/2023 oxycodone  hcl (ir) 10 mg tab 84 14 Cameron [name] [id number] Other [facility]   [name] 11/28/2023 11/30/2023 oxycodone  hcl (ir) 10 mg tab 84 14 [name] Cameron CHRISTELLA MD [id number] Other [facility]   [name] 11/15/2023 11/16/2023 oxycodone  hcl (ir) 10 mg tab 84 14 [name] Cameron CHRISTELLA MD [id number] Other [facility]   [name] 10/30/2023 11/02/2023 oxycodone  hcl (ir) 10 mg tab 84 14 [name] Cameron CHRISTELLA MD [id number] Other [facility]   [name] 10/18/2023 10/19/2023 oxycodone  hcl (ir) 10 mg tab 84 14 [name] Cameron CHRISTELLA MD [id number] Other [facility]   [name] 10/03/2023 10/05/2023 oxycodone  hcl (ir) 10 mg tab [address] [name] [id number] Other [facility]   [name] 09/19/2023 09/21/2023 oxycodone  hcl (ir) 10 mg tab 84 14 [name] Cameron CHRISTELLA MD [id number] Other [facility]   [name] 09/05/2023 09/07/2023 oxycodone  hcl (ir) 10 mg tab 84 14 [name] Cameron CHRISTELLA MD [id number] Other [facility]   [name] 08/21/2023 08/24/2023 oxycodone  hcl (ir) 10 mg tab 84 14 [name] Cameron CHRISTELLA MD [id number] Other [facility]   [name] 08/09/2023 08/10/2023 oxycodone  hcl (ir) 10 mg tab 84 14 Cameron Gwenn,  [name] [id number] Other [facility]   [name] 07/26/2023 07/27/2023 oxycodone  hcl (ir) 10 mg tab [address] Cameron Cameron HERO MD [id number] Other [facility]   [name] 07/12/2023 07/13/2023 oxycodone  hcl (ir) 10 mg tab [address] Cameron Cameron HERO MD [id number] Other [facility]   [name] 06/28/2023 06/29/2023 oxycodone  hcl (ir) 10 mg tab [address] Cameron Cameron HERO MD [id number] Other [facility]   [name] 06/15/2023 06/15/2023 oxycodone  hcl (ir) 10 mg tab [address] Mordecai Cameron HERO MD [id number] Other [facility]   [name] 05/29/2023 06/01/2023 oxycodone  hcl (ir) 10 mg tab [address] Cameron [name] [id number] Other [facility]   [name] 05/17/2023 05/18/2023  oxycodone  hcl (ir) 10 mg tab 84 14 Cameron Cameron HERO MD [id number] Other [facility]   [name] 05/04/2023 05/04/2023 oxycodone  hcl (ir) 10 mg tab [address] Cameron [name] [id number] Other [facility]   A U LOISE [name] 04/18/2023 04/20/2023 oxycodone  hcl (ir) 10 mg tab 84 14 Cameron Cameron HERO MD [id number] Other [facility]   [name] 04/04/2023 04/06/2023 oxycodone  hcl (ir) 10 mg tab [address] Cameron Cameron HERO MD [id number] Other [facility]   [name] 03/23/2023 03/23/2023 oxycodone  hcl (ir) 10 mg tab 84 14 Cameron [name] [id number] Other [facility]

## 2024-03-20 ENCOUNTER — Other Ambulatory Visit: Payer: Self-pay

## 2024-03-20 MED ORDER — OXYCODONE HCL 10 MG PO TABS *I*
10.0000 mg | ORAL_TABLET | ORAL | 0 refills | Status: DC | PRN
Start: 2024-03-20 — End: 2024-04-02
  Filled 2024-03-20: qty 84, 14d supply, fill #0

## 2024-03-21 ENCOUNTER — Other Ambulatory Visit: Payer: Self-pay

## 2024-04-01 ENCOUNTER — Encounter: Payer: Self-pay | Admitting: Ophthalmology

## 2024-04-02 ENCOUNTER — Telehealth: Payer: Self-pay | Admitting: Student in an Organized Health Care Education/Training Program

## 2024-04-02 ENCOUNTER — Other Ambulatory Visit: Payer: Self-pay | Admitting: Primary Care

## 2024-04-02 ENCOUNTER — Telehealth: Payer: Self-pay | Admitting: Primary Care

## 2024-04-02 NOTE — Telephone Encounter (Signed)
 Patient Demographic Information [facility])       [facility] First Name Last Name Birth Date Gender Street Address [facility] Zip Code   Cameron [name] 01-Jun-1996 Male [address] [place] [address]        Search:  [facility] My Rx Current Rx Drug Type Rx Written Rx Dispensed Drug Quantity Days Supply Prescriber Name Prescriber [facility] # Payment Method Dispenser   Cameron Cameron [name] 03/20/2024 03/21/2024 oxycodone  hcl (ir) 10 mg tab 84 14 [name] [id number] Other [facility]   Cameron Cameron [name] 03/05/2024 03/07/2024 oxycodone  hcl (ir) 10 mg tab 84 14 [name] [id number] Other [facility]   Cameron Cameron [name] 02/20/2024 02/22/2024 oxycodone  hcl (ir) 10 mg tab 84 14 [name] [id number] Other [facility]   Cameron Cameron [name] 02/06/2024 02/08/2024 oxycodone  hcl (ir) 10 mg tab 84 14 [name] [id number] Other [facility]   Cameron Cameron [name] 01/23/2024 01/25/2024 oxycodone  hcl (ir) 10 mg tab 84 14 [name] [id number] Other [facility]   Cameron Cameron [name] 01/08/2024 01/11/2024 oxycodone  hcl (ir) 10 mg tab 84 14 Metta Longs [id number] Other [facility]   Cameron U [name] 12/25/2023 12/28/2023 oxycodone  hcl (ir) 10 mg tab 84 14 [name] [id number] Other [facility]   Cameron Cameron [name] 12/13/2023 12/14/2023 oxycodone  hcl (ir) 10 mg tab 84 14 [name] [id number] Other [facility]   Cameron U Cameron [name] 11/28/2023 11/30/2023 oxycodone  hcl (ir) 10 mg tab 84 14 [name] [id number] Other [facility]   Cameron Cameron [name] 11/15/2023 11/16/2023 oxycodone  hcl (ir) 10 mg tab 84 14 [name] [id number] Other [facility]   Cameron Cameron [name] 10/30/2023 11/02/2023 oxycodone  hcl (ir) 10 mg tab 84 14 [name] [id number] Other [facility]   Cameron Cameron [name] 10/18/2023 10/19/2023 oxycodone  hcl (ir) 10 mg tab 84 14 [name] [id number] Other [facility]   Cameron Cameron [name] 10/03/2023 10/05/2023 oxycodone  hcl (ir) 10 mg tab [address] [name] [id number] Other [facility]   Cameron Cameron [name] 09/19/2023 09/21/2023 oxycodone  hcl (ir) 10 mg tab 84 14 [name] [id number] Other [facility]   Cameron Cameron [name] 09/05/2023 09/07/2023 oxycodone  hcl (ir) 10 mg tab 84 14 [name] [id number] Other [facility]   Cameron Cameron [name] 08/21/2023 08/24/2023 oxycodone  hcl (ir) 10 mg tab 84 14 [name] [id number] Other [facility]   Cameron Cameron [name] 08/09/2023 08/10/2023 oxycodone  hcl (ir) 10 mg tab [address] [name] [id number] Other [facility]   Cameron U Cameron [name] 07/26/2023 07/27/2023 oxycodone  hcl (ir) 10 mg tab 84 14 [name] [id number] Other [facility]   Cameron Cameron [name] 07/12/2023 07/13/2023 oxycodone  hcl (ir) 10 mg tab 84 14 [name] [id number] Other [facility]   Cameron Cameron [name] 06/28/2023 06/29/2023 oxycodone  hcl (ir) 10 mg tab 84 14 [name] [id number] Other [facility]   Cameron U [name] 06/15/2023 06/15/2023 oxycodone  hcl (ir) 10 mg tab 84 14 [name] [id number] Other [facility]   Cameron Cameron [name] 05/29/2023 06/01/2023 oxycodone  hcl (ir) 10 mg tab [address] [name] [id number] Other [facility]   Cameron Cameron [name] 05/17/2023 05/18/2023 oxycodone  hcl (ir) 10 mg tab 84 14 [name] [id number] Other [facility]   Cameron Cameron [name] 05/04/2023 05/04/2023 oxycodone  hcl (ir) 10 mg tab 84 14 [name] [id number] Other [facility]   Cameron U Cameron [name] 04/18/2023 04/20/2023 oxycodone  hcl (ir) 10 mg tab 84 14 [name] [id number] Other [facility]   Cameron Cameron [name] 04/04/2023 04/06/2023 oxycodone  hcl (ir) 10 mg tab 84 14 [name] [id number] Other [facility]     Showing 1 to 26 of 26 entries     * -  Details of Drug Type : O = Opioid, B = Benzodiazepine, S = Stimulant  * - Drugs marked with an asterisk are compound drugs. If the compound drug is made up of more than one controlled substance, then each controlled substance will be Cameron separate row in the table.

## 2024-04-02 NOTE — Telephone Encounter (Signed)
 [facility]  Prior Authorization Request    Date of request: 04/02/2024  Time of call: 3:27 PM    Medication Requiring PA:oxyCODONE  (ROXICODONE ) 10 mg immediate release tablet     Directions(dosing and frequency): Take 1 tablet (10 mg total) by mouth every 4 hours as needed for Sickle Cell Disease Pain. Max daily dose: 6 tablets (60 mg) - Oral     Quantity/ Day Supply:84 tablet (14 day supply     Dx code: D57.1, D56.4     Request received from:[facility] PA Source: Pharmacy    Name and Phone Number of Pharmacy:  [facility]  [address], [place] [address]  Phone: [phone]  Fax: [phone]   Pharmacy Comments: Royden Rasher  Reference #: [id number] due 6/28       Please run secondary if not covered under primary: yes       Insurance coverage confirmed: yes    Tax ID: [id number]    Provider and NPI: [id number]    Additional Justification:n/a    Key ID:     Please provide justification for PA submission & urgency or indicate which covered alternative was ordered    This submission is for continuation of pain management therapy on which the patient has been stable and prevents hospitalization for the manifestations of underlying sickle cell disease.  Per the CDC statement in April 2019 opioid therapy is clinically indicated in the care of patients with sickle cell disease and their recommendations should NOT be utilized to deny these patients access to opioid therapy.  Lack of access to this medication will result in increased hospital utilization, increase risk of significant morbidity and mortality.

## 2024-04-03 ENCOUNTER — Other Ambulatory Visit: Payer: Self-pay

## 2024-04-03 MED ORDER — MESALAMINE 800 MG PO TBEC *I*
2400.0000 mg | DELAYED_RELEASE_TABLET | Freq: Two times a day (BID) | ORAL | 5 refills | Status: AC
Start: 2024-04-03 — End: ?
  Filled 2024-04-03: qty 180, 30d supply, fill #0

## 2024-04-03 MED ORDER — OXYCODONE HCL 10 MG PO TABS *I*
10.0000 mg | ORAL_TABLET | ORAL | 0 refills | Status: DC | PRN
Start: 2024-04-03 — End: 2024-04-16
  Filled 2024-04-03: qty 84, 14d supply, fill #0

## 2024-04-03 NOTE — Telephone Encounter (Signed)
 Please make an appointment for [place] on my schedule

## 2024-04-03 NOTE — Telephone Encounter (Signed)
 Attempted to call pt  No answer  LVM  Call back number provided  Mychart message sent    LANETA SHARPS, RN

## 2024-04-04 ENCOUNTER — Other Ambulatory Visit: Payer: Self-pay

## 2024-04-06 NOTE — Telephone Encounter (Signed)
 Attempted to call pt  No answer  LVM  Call back number provided    Loann Quill, RN

## 2024-04-07 ENCOUNTER — Ambulatory Visit: Admitting: Primary Care

## 2024-04-08 NOTE — Telephone Encounter (Signed)
 Attempted to call pt  No answer  LVM  Call back number provided    Loann Quill, RN

## 2024-04-14 ENCOUNTER — Other Ambulatory Visit: Payer: Self-pay

## 2024-04-16 ENCOUNTER — Other Ambulatory Visit: Payer: Self-pay | Admitting: Primary Care

## 2024-04-16 NOTE — Telephone Encounter (Signed)
 Search Terms: Veto Macqueen, May 14, 1996  Search Date: 04/16/2024 08:37:14 AM  Searching on behalf of: [id number] - Cameron [name]  The Drug Utilization Report below displays all of the controlled substance prescriptions, if any, that your patient has filled in the last twelve months. The information displayed on this report is compiled from pharmacy submissions to the Department, and accurately reflects the information as submitted by the pharmacies.  This report was requested by: Rollene Maris  Reference #: [id number]  Practitioner Count: 1  Pharmacy Count: 1  Current Opioid Prescriptions: 1  Current Benzodiazepine Prescriptions: 0  Current Stimulant Prescriptions: 0        Patient Demographic Information [facility])       [facility] First Name Last Name Birth Date Gender Street Address [facility] Zip Code   A Torre Cameron [name] [birth year] Male [address] [place] [address]        Search:  [facility] My Rx Current Rx Drug Type Rx Written Rx Dispensed Drug Quantity Days Supply Prescriber Name Prescriber [facility] # Payment Method Dispenser   A U Y O 04/03/2024 04/04/2024 oxycodone  hcl (ir) 10 mg tab 84 14 [name] Cameron CHRISTELLA MD [id number] [facility]   A [name] 03/20/2024 03/21/2024 oxycodone  hcl (ir) 10 mg tab 84 14 [name] Cameron CHRISTELLA MD [id number] Other [facility]   A [name] 03/05/2024 03/07/2024 oxycodone  hcl (ir) 10 mg tab 84 14 [name] Cameron CHRISTELLA MD [id number] Other [facility]   A [name] 02/20/2024 02/22/2024 oxycodone  hcl (ir) 10 mg tab 84 14 [name] Cameron CHRISTELLA MD [id number] Other [facility]   A [name] 02/06/2024 02/08/2024 oxycodone  hcl (ir) 10 mg tab 84 14 [name] Cameron CHRISTELLA MD [id number] [facility]   A [name] 01/23/2024 01/25/2024 oxycodone  hcl (ir) 10 mg tab 84 14 [name] Cameron CHRISTELLA MD [id number] [facility]   A [name] 01/08/2024 01/11/2024 oxycodone  hcl (ir) 10 mg tab 84 14 Metta Longs [id number] Other [facility]   [name] 12/25/2023 12/28/2023 oxycodone  hcl (ir) 10 mg tab 84 14 [name] Cameron CHRISTELLA MD [id number] [facility]   A [name] 12/13/2023 12/14/2023 oxycodone  hcl (ir) 10 mg tab 84 14 [name] [id number] Other [facility]   A [name] 11/28/2023 11/30/2023 oxycodone  hcl (ir) 10 mg tab 84 14 [name] Cameron CHRISTELLA MD [id number] [facility]   A [name] 11/15/2023 11/16/2023 oxycodone  hcl (ir) 10 mg tab 84 14 [name] Cameron CHRISTELLA MD [id number] [facility]   A [name] 10/30/2023 11/02/2023 oxycodone  hcl (ir) 10 mg tab 84 14 [name] Cameron CHRISTELLA MD [id number] [facility]   A [name] 10/18/2023 10/19/2023 oxycodone  hcl (ir) 10 mg tab 84 14 [name] Cameron CHRISTELLA MD [id number] [facility]   A [name] 10/03/2023 10/05/2023 oxycodone  hcl (ir) 10 mg tab [address] [name] [id number] Other [facility]   A [name] 09/19/2023 09/21/2023 oxycodone  hcl (ir) 10 mg tab 84 14 [name] Cameron CHRISTELLA MD [id number] [facility]   A [name] 09/05/2023 09/07/2023 oxycodone  hcl (ir) 10 mg tab 84 14 [name] Cameron  M MD [id number] Other [facility]   [name] 08/21/2023 08/24/2023 oxycodone  hcl (ir) 10 mg tab [address] Mordecai Cameron HERO MD [id number] Other [facility]   A [name] 08/09/2023 08/10/2023 oxycodone  hcl (ir) 10 mg tab [address] [name] [id number] Other [facility]   A [name] 07/26/2023 07/27/2023 oxycodone  hcl (ir) 10 mg tab 84 14 Mordecai Cameron HERO MD [id number] [facility]   A [name] 07/12/2023 07/13/2023 oxycodone  hcl (ir) 10 mg tab [address] Mordecai Cameron HERO MD [id number] Other [facility]   A [name] 06/28/2023 06/29/2023 oxycodone  hcl (ir) 10 mg tab [address] Mordecai Cameron HERO MD [id number] Other [facility]   A [name] 06/15/2023 06/15/2023  oxycodone  hcl (ir) 10 mg tab 84 14 Mordecai Cameron HERO MD [id number] Other [facility]   A [name] 05/29/2023 06/01/2023 oxycodone  hcl (ir) 10 mg tab [address] [name] [id number] Other [facility]   A [name] 05/17/2023 05/18/2023 oxycodone  hcl (ir) 10 mg tab 84 14 Mordecai Cameron HERO MD [id number] Other [facility]   A [name] 05/04/2023 05/04/2023 oxycodone  hcl (ir) 10 mg tab [address] [name] [id number] Other [facility]   [name] 04/18/2023 04/20/2023 oxycodone  hcl (ir) 10 mg tab 84 14 Mordecai Cameron HERO MD [id number] Other [facility]

## 2024-04-18 ENCOUNTER — Other Ambulatory Visit: Payer: Self-pay

## 2024-04-18 ENCOUNTER — Other Ambulatory Visit: Payer: Self-pay | Admitting: Primary Care

## 2024-04-18 MED ORDER — OXYCODONE HCL 10 MG PO TABS *I*
10.0000 mg | ORAL_TABLET | ORAL | 0 refills | Status: DC | PRN
Start: 2024-04-18 — End: 2024-04-28
  Filled 2024-04-18: qty 84, 14d supply, fill #0

## 2024-04-23 ENCOUNTER — Other Ambulatory Visit: Payer: Self-pay

## 2024-04-28 ENCOUNTER — Other Ambulatory Visit: Payer: Self-pay | Admitting: Student in an Organized Health Care Education/Training Program

## 2024-04-29 ENCOUNTER — Other Ambulatory Visit: Payer: Self-pay

## 2024-04-29 MED ORDER — OXYCODONE HCL 10 MG PO TABS *I*
10.0000 mg | ORAL_TABLET | ORAL | 0 refills | Status: DC | PRN
Start: 2024-04-29 — End: 2024-05-15
  Filled 2024-04-29: qty 84, 14d supply, fill #0

## 2024-04-29 NOTE — Telephone Encounter (Signed)
 Search Terms: Wallace Gappa, [birth year]Search Date: 04/29/2024 08:07:35 AMSearching on behalf of: [id number] - Elspeth CHRISTELLA Sale Drug Utilization Report below displays all of Cameron controlled substance prescriptions, if any, that your patient has filled in Cameron last twelve months. Cameron information displayed on this report is compiled from pharmacy submissions to Cameron Department, and accurately reflects Cameron information as submitted by Cameron pharmacies.This report was requested by: Rollene Maris  Reference #: [id number] Practitioner Count: 3Pharmacy Count: 2Current Opioid Prescriptions: 1Current Benzodiazepine Prescriptions: 0Current Stimulant Prescriptions: 0  Patient Demographic Information [facility])   [facility] First Name Last Name Birth Date Gender Street Address [facility] Zip Code [name] 1995/10/20 Male [address] [place] [address] [name] [birth year] Male [address] [place] [address]  Search:PDI My Rx Current Rx Drug Type Rx Written Rx Dispensed Drug Quantity Days Supply Prescriber Name Prescriber [facility] # Payment Method Dispenser [name] 04/23/2024 04/23/2024 oxycodone  hcl (ir) 10 mg tab 18 3 Cameron [name] [id number] [facility] [phone] Cameron [name] 04/18/2024 04/18/2024 oxycodone  hcl (ir) 10 mg tab 84 14 Cameron [name] [id number] Other [name] [name] 04/03/2024 04/04/2024 oxycodone  hcl (ir) 10 mg tab 84 14 Mordecai Elspeth CHRISTELLA MD [id number] Other Cameron [name] [facility] [name] 03/20/2024 03/21/2024 oxycodone  hcl (ir) 10 mg tab 84 14 Mordecai Elspeth CHRISTELLA MD [id number] Other Cameron [name] [facility] [name] 03/05/2024 03/07/2024 oxycodone  hcl (ir) 10 mg tab 84 14 Mordecai Elspeth CHRISTELLA MD [id number] Other Cameron [name] Pharmac [name] 02/20/2024 02/22/2024 oxycodone  hcl (ir) 10 mg tab 84 14 Mordecai Elspeth CHRISTELLA MD [id number] Other Cameron [name] Pharmac [name] 02/06/2024 02/08/2024 oxycodone  hcl  (ir) 10 mg tab 84 14 Mordecai Elspeth CHRISTELLA MD [id number] Other Cameron [name] Pharmac [name] 01/23/2024 01/25/2024 oxycodone  hcl (ir) 10 mg tab 84 14 Mordecai Elspeth CHRISTELLA MD [id number] Other Cameron [name] Pharmac [name] 01/08/2024 01/11/2024 oxycodone  hcl (ir) 10 mg tab 84 14 Metta Longs [id number] Other Cameron [name] Pharmac [name] 12/25/2023 12/28/2023 oxycodone  hcl (ir) 10 mg tab 84 14 Mordecai Elspeth CHRISTELLA MD [id number] Other Cameron [name] Pharmac [name] 12/13/2023 12/14/2023 oxycodone  hcl (ir) 10 mg tab 84 14 Cameron [name] [id number] Other Cameron [name] Pharmac [name] 11/28/2023 11/30/2023 oxycodone  hcl (ir) 10 mg tab 84 14 Mordecai Elspeth CHRISTELLA MD [id number] Other Cameron [name] Pharmac [name] 11/15/2023 11/16/2023 oxycodone  hcl (ir) 10 mg tab 84 14 Mordecai Elspeth CHRISTELLA MD [id number] Other Cameron [name] Pharmac [name] 10/30/2023 11/02/2023 oxycodone  hcl (ir) 10 mg tab 84 14 Mordecai Elspeth CHRISTELLA MD [id number] Other Cameron [name] Pharmac [name] 10/18/2023 10/19/2023 oxycodone  hcl (ir) 10 mg tab 84 14 Mordecai Elspeth CHRISTELLA MD [id number] Other Cameron [name] Pharmac [name] 10/03/2023 10/05/2023 oxycodone  hcl (ir) 10 mg tab [address] Cameron [name] [id number] Other Cameron [name] Pharmac [name] 09/19/2023 09/21/2023 oxycodone  hcl (ir) 10 mg tab 84 14 Mordecai Elspeth CHRISTELLA MD [id number] Other Cameron [name] Pharmac [name] 09/05/2023 09/07/2023 oxycodone  hcl (ir) 10 mg tab 84 14 Mordecai Elspeth CHRISTELLA MD [id number] Other Cameron [name] Pharmac [name] 08/21/2023  08/24/2023 oxycodone  hcl (ir) 10 mg tab 84 14 [name] [id number] Other Cameron [name] Pharmac [name] 08/09/2023 08/10/2023 oxycodone  hcl (ir) 10 mg tab [address] Cameron [name] [id number] Other Cameron [name] Pharmac A U LOISE [name] 07/26/2023 07/27/2023 oxycodone  hcl (ir) 10 mg tab 84 14 [name] [id number] Other Cameron  [name] [name] 07/12/2023 07/13/2023 oxycodone  hcl (ir) 10 mg tab [address] [name] [id number] Other Cameron [name] Pharmac [name] 06/28/2023 06/29/2023 oxycodone  hcl (ir) 10 mg tab [address] [name] [id number] Other Cameron [name] Pharmac [name] 06/15/2023 06/15/2023 oxycodone  hcl (ir) 10 mg tab [address] [name] [id number] Other Cameron [name] Pharmac [name] 05/29/2023 06/01/2023 oxycodone  hcl (ir) 10 mg tab [address] Cameron [name] [id number] Other Cameron [name] Pharmac [name] 05/17/2023 05/18/2023 oxycodone  hcl (ir) 10 mg tab 84 14 [name] [id number] Other Cameron [name] [facility] [name] 05/04/2023 05/04/2023 oxycodone  hcl (ir) 10 mg tab [address] Cameron [name] [id number] Other Cameron [name] Pharmac Showing 1 to 27 of 27 entries

## 2024-04-30 ENCOUNTER — Telehealth: Payer: Self-pay | Admitting: Primary Care

## 2024-04-30 ENCOUNTER — Other Ambulatory Visit: Payer: Self-pay

## 2024-04-30 NOTE — Telephone Encounter (Signed)
 [facility] CenterPrior Authorization RequestI know it says we do not need auth, but this is for quantity limit exceededDate of request: 8/7/2025Time of call: 10:41 AMMedication Requiring PA:oxyCODONE  (ROXICODONE ) 10 mg immediate release tablet Directions(dosing and frequency): Take 1 tablet (10 mg total) by mouth every 4 hours as needed for Sickle Cell Disease Pain. Max daily dose: 6 tablets (60 mg) - Oral Quantity/ Day Supply:84 tablet (14 day supply) Dx code:Request received from:CCC PA Source: PharmacyName and Phone Number of Pharmacy:[facility] [address], [place] [address]Eynwz: [phone]  Fax: [phone] Pharmacy Comments: LF 7/26x14ds ([facility]) & 7[facility]31[facility]3ds [facility]same doses-- due 8/12 accounting for both fills. This report was requested by: Kristen Hand  Reference #: [id number] Please run secondary if not covered under primary: yes   Insurance coverage confirmed: yesCovered Alternative Medication: Tax ID: [id number] Provider and NPI: [id number] Additional Justification:n/aKey ID: Please provide justification for PA submission & urgency or indicate which covered alternative was orderedThis submission is for continuation of pain management therapy on which the patient has been stable and prevents hospitalization for the manifestations of underlying sickle cell disease.  Per the CDC statement in April 2019 opioid therapy is clinically indicated in the care of patients with sickle cell disease and their recommendations should NOT be utilized to deny these patients access to opioid therapy.  Lack of access to this medication will result in increased hospital utilization, increase risk of significant morbidity and mortality.

## 2024-05-01 ENCOUNTER — Other Ambulatory Visit: Payer: Self-pay

## 2024-05-02 ENCOUNTER — Other Ambulatory Visit: Payer: Self-pay

## 2024-05-05 ENCOUNTER — Other Ambulatory Visit: Payer: Self-pay

## 2024-05-15 ENCOUNTER — Other Ambulatory Visit: Payer: Self-pay | Admitting: Primary Care

## 2024-05-15 DIAGNOSIS — J069 Acute upper respiratory infection, unspecified: Secondary | ICD-10-CM

## 2024-05-15 NOTE — Telephone Encounter (Signed)
 Patient Demographic Information [name])   [facility] First Name Last Name Birth Date Gender Street Address [facility] Zip Code Cameron [name] 1995-11-23 Male [address] [place] [address] Cameron [name] 02/13/1996 Male [address] [place] [address]  Search:PDI My Rx Current Rx Drug Type Rx Written Rx Dispensed Drug Quantity Days Supply Prescriber Name Prescriber Cameron [name] # Payment Method Dispenser Cameron [name] Cameron [name] 04/29/2024 05/05/2024 oxycodone  hcl (ir) 10 mg tab 84 14 Cameron Cameron HERO MD [id number] Other [facility] Cameron [name] Cameron [name] 04/23/2024 04/23/2024 oxycodone  hcl (ir) 10 mg tab 18 3 Cameron [name] [id number] [facility] [phone] Cameron Cameron [name] 04/18/2024 04/18/2024 oxycodone  hcl (ir) 10 mg tab 84 14 Cameron [name] [id number] Other [facility] Cameron [name] Cameron [facility] Cameron Cameron [name] 04/03/2024 04/04/2024 oxycodone  hcl (ir) 10 mg tab 84 14 Cameron Cameron HERO MD [id number] Other [facility] Cameron [name] Cameron [facility] Cameron [name] Cameron [name] 03/20/2024 03/21/2024 oxycodone  hcl (ir) 10 mg tab 84 14 Cameron Cameron HERO MD [id number] Other [facility] Cameron [name] Cameron [facility] Cameron [name] Cameron [name] 03/05/2024 03/07/2024 oxycodone  hcl (ir) 10 mg tab 84 14 Cameron Cameron HERO MD [id number] Other [facility] Cameron [name] Cameron Pharmac Cameron [name] Cameron [name] 02/20/2024 02/22/2024 oxycodone  hcl (ir) 10 mg tab 84 14 Cameron Cameron HERO MD [id number] Other [facility] Cameron [name] Cameron Pharmac Cameron [name] Cameron [name] 02/06/2024 02/08/2024 oxycodone  hcl (ir) 10 mg tab 84 14 Cameron Cameron HERO MD [id number] Other [facility] Cameron [name] Cameron [facility] Cameron [name] Cameron [name] 01/23/2024 01/25/2024 oxycodone  hcl (ir) 10 mg tab 84 14 Cameron Cameron HERO MD [id number] Other [facility] Cameron [name] Cameron Pharmac Cameron [name] Cameron [name] 01/08/2024 01/11/2024 oxycodone  hcl (ir) 10 mg tab 84 14 Cameron [name] [id number] Other [facility] Cameron [name] Cameron [facility] Cameron Cameron [name] 12/25/2023 12/28/2023 oxycodone  hcl (ir) 10 mg tab 84 14 Cameron Cameron HERO MD [id number] Other [facility] Cameron [name] Cameron [facility] Cameron [name] Cameron [name] 12/13/2023 12/14/2023 oxycodone  hcl (ir) 10 mg tab 84  14 Cameron [name] [id number] Other [facility] Cameron [name] Cameron [facility] Cameron [name] Cameron [name] 11/28/2023 11/30/2023 oxycodone  hcl (ir) 10 mg tab 84 14 Cameron Cameron HERO MD [id number] Other [facility] Cameron [name] Cameron [facility] Cameron [name] Cameron [name] 11/15/2023 11/16/2023 oxycodone  hcl (ir) 10 mg tab 84 14 Cameron Cameron HERO MD [id number] Other [facility] Cameron [name] Cameron [facility] Cameron [name] Cameron [name] 10/30/2023 11/02/2023 oxycodone  hcl (ir) 10 mg tab 84 14 Cameron Cameron HERO MD [id number] Other [facility] Cameron [name] Cameron [facility] Cameron [name] Cameron [name] 10/18/2023 10/19/2023 oxycodone  hcl (ir) 10 mg tab 84 14 Cameron Cameron HERO MD [id number] Other [facility] Cameron [name] Cameron [facility] Cameron [name] Cameron [name] 10/03/2023 10/05/2023 oxycodone  hcl (ir) 10 mg tab [address] Cameron [name] ORN [id number] Other [facility] Cameron [name] Cameron [facility] Cameron [name] Cameron [name] 09/19/2023 09/21/2023 oxycodone  hcl (ir) 10 mg tab 84 14 Cameron Cameron HERO MD [id number] Other [facility] Cameron [name] Cameron [facility] Cameron [name] Cameron [name] 09/05/2023 09/07/2023 oxycodone  hcl (ir) 10 mg tab 84 14 Cameron Cameron HERO MD [id number] Other [facility] Cameron [name] Cameron [facility] Cameron [name] Cameron [name] 08/21/2023 08/24/2023 oxycodone  hcl (ir) 10 mg tab 84 14 Cameron Cameron HERO MD [id number] Other [facility] Cameron [name] Cameron [facility] Cameron [name] Cameron [name] 08/09/2023 08/10/2023 oxycodone  hcl (ir) 10 mg tab [address] Cameron [name] ORN [id number] Other [facility] Cameron [name] [facility] Cameron [name] Cameron [name] 07/26/2023 07/27/2023 oxycodone  hcl (ir) 10 mg tab 84 14 Cameron Cameron HERO MD [id number]  Other [name] Cameron Cameron [name] 07/12/2023 07/13/2023 oxycodone  hcl (ir) 10 mg tab 84 14 Cameron Cameron HERO MD [id number] Other [name] Cameron [name] Cameron [name] 06/28/2023 06/29/2023 oxycodone  hcl (ir) 10 mg tab 84 14 Cameron Cameron HERO MD [id number] Other [name] Cameron [name] Cameron [name] 06/15/2023 06/15/2023 oxycodone  hcl (ir) 10 mg tab 84 14 Cameron Cameron HERO MD [id number] Other [name] Cameron [name] Cameron [name] 05/29/2023 06/01/2023 oxycodone  hcl (ir) 10 mg tab [address] Cameron [name] ORN [id number] Other [name] Cameron [name] Cameron [name] 05/17/2023 05/18/2023 oxycodone  hcl (ir) 10 mg tab 84 14 Cameron Cameron HERO MD [id number] Other [name] Showing 1 to 27 of 27 entries * - Details of Drug Type : O = Opioid, Cameron = Benzodiazepine, S = Stimulant* - Drugs marked with an asterisk are compound drugs. If the compound drug is made up of more than one controlled substance, then each controlled substance will be Cameron separate row in the table.

## 2024-05-18 ENCOUNTER — Encounter: Payer: Self-pay | Admitting: Primary Care

## 2024-05-18 ENCOUNTER — Other Ambulatory Visit: Payer: Self-pay

## 2024-05-18 MED ORDER — OXYCODONE HCL 10 MG PO TABS *I*
10.0000 mg | ORAL_TABLET | ORAL | 0 refills | Status: DC | PRN
Start: 2024-05-18 — End: 2024-05-28
  Filled 2024-05-18: qty 84, 14d supply, fill #0

## 2024-05-18 MED ORDER — ACETAMINOPHEN 500 MG PO TABS *I*
1000.0000 mg | ORAL_TABLET | Freq: Three times a day (TID) | ORAL | 5 refills | Status: AC | PRN
Start: 2024-05-18 — End: ?
  Filled 2024-05-18: qty 100, 17d supply, fill #0

## 2024-05-19 ENCOUNTER — Other Ambulatory Visit: Payer: Self-pay

## 2024-05-28 ENCOUNTER — Other Ambulatory Visit: Payer: Self-pay | Admitting: Primary Care

## 2024-05-28 NOTE — Telephone Encounter (Signed)
 Search Terms: [name], September 14, 1997Search Date: 05/28/2024 07:55:03 AMSearching on behalf of: [id number] - Elspeth CHRISTELLA Sale Drug Utilization Report below displays all of the controlled substance prescriptions, if any, that your patient has filled in the last twelve months. The information displayed on this report is compiled from pharmacy submissions to the Department, and accurately reflects the information as submitted by the pharmacies.This report was requested by: Rollene Maris  Reference #: [id number] Practitioner Count: 3Pharmacy Count: 2Current Opioid Prescriptions: 1Current Benzodiazepine Prescriptions: 0Current Stimulant Prescriptions: 0  Patient Demographic Information [facility])   [facility] First Name Last Name Birth Date Gender Street Address [facility] Zip Code [name] May 25, 1996 Male [address] [place] [address] [name] 1996-08-08 Male [address] [place] [address]  Search:PDI My Rx Current Rx Drug Type Rx Written Rx Dispensed Drug Quantity Days Supply Prescriber Name Prescriber [facility] # Payment Method Dispenser A RAYMOND CINDERELLA KIDD 05/18/2024 05/19/2024 oxycodone  hcl (ir) 10 mg tab 84 14 Mordecai Elspeth CHRISTELLA MD [id number] Other [facility] A [name] 04/29/2024 05/05/2024 oxycodone  hcl (ir) 10 mg tab 84 14 Mordecai Elspeth CHRISTELLA MD [id number] Other [facility] Richarda KATHEE [name] 04/23/2024 04/23/2024 oxycodone  hcl (ir) 10 mg tab 18 3 Donavan Mungo [id number] [facility] [phone] A U LOISE KIDD 04/18/2024 04/18/2024 oxycodone  hcl (ir) 10 mg tab 84 14 Lawernce Fallow [id number] Other [facility] A U LOISE KIDD 04/03/2024 04/04/2024 oxycodone  hcl (ir) 10 mg tab 84 14 Mordecai Elspeth CHRISTELLA MD [id number] Other [facility] A [name] 03/20/2024 03/21/2024 oxycodone  hcl (ir) 10 mg tab 84 14 Mordecai Elspeth CHRISTELLA MD [id number] Other [facility] A [name] 03/05/2024 03/07/2024 oxycodone  hcl  (ir) 10 mg tab 84 14 Mordecai Elspeth CHRISTELLA MD [id number] Other [facility] A [name] 02/20/2024 02/22/2024 oxycodone  hcl (ir) 10 mg tab 84 14 Mordecai Elspeth CHRISTELLA MD [id number] Other [facility] A [name] 02/06/2024 02/08/2024 oxycodone  hcl (ir) 10 mg tab 84 14 Mordecai Elspeth CHRISTELLA MD [id number] Other [facility] A [name] 01/23/2024 01/25/2024 oxycodone  hcl (ir) 10 mg tab 84 14 Mordecai Elspeth CHRISTELLA MD [id number] Other [facility] A [name] 01/08/2024 01/11/2024 oxycodone  hcl (ir) 10 mg tab 84 14 Metta Longs [id number] Other [facility] [name] 12/25/2023 12/28/2023 oxycodone  hcl (ir) 10 mg tab 84 14 Mordecai Elspeth CHRISTELLA MD [id number] Other [facility] A [name] 12/13/2023 12/14/2023 oxycodone  hcl (ir) 10 mg tab 84 14 [name] [id number] Other [facility] A [name] 11/28/2023 11/30/2023 oxycodone  hcl (ir) 10 mg tab 84 14 Mordecai Elspeth CHRISTELLA MD [id number] Other [facility] A [name] 11/15/2023 11/16/2023 oxycodone  hcl (ir) 10 mg tab 84 14 Mordecai Elspeth CHRISTELLA MD [id number] Other [facility] A [name] 10/30/2023 11/02/2023 oxycodone  hcl (ir) 10 mg tab 84 14 Mordecai Elspeth CHRISTELLA MD [id number] Other [facility] A [name] 10/18/2023 10/19/2023 oxycodone  hcl (ir) 10 mg tab 84 14 Mordecai Elspeth CHRISTELLA MD [id number] Other [facility] A [name] 10/03/2023 10/05/2023 oxycodone  hcl (ir) 10 mg tab [address] [name] [id number] Other [facility] A [name] 09/19/2023  09/21/2023 oxycodone  hcl (ir) 10 mg tab 84 14 [name] [id number] Other [facility] A [name] 09/05/2023 09/07/2023 oxycodone  hcl (ir) 10 mg tab [address] [name] [id number] Other [facility] A [name] 08/21/2023 08/24/2023 oxycodone  hcl (ir) 10 mg tab [address] [name] [id number] Other [facility] A [name] 08/09/2023 08/10/2023 oxycodone  hcl (ir) 10 mg tab [address] [name] [id number] Other [facility] A [name] 07/26/2023 07/27/2023 oxycodone  hcl (ir) 10 mg tab 84 14 [name] [id number] Other [facility] A [name] 07/12/2023 07/13/2023 oxycodone  hcl (ir) 10 mg tab [address] [name] [id number] Other [facility] A [name] 06/28/2023 06/29/2023 oxycodone  hcl (ir) 10 mg tab [address] [name] [id number] Other [facility] A [name] 06/15/2023 06/15/2023 oxycodone  hcl (ir) 10 mg tab [address] [name] [id number] Other [facility] A [name] 05/29/2023 06/01/2023 oxycodone  hcl (ir) 10 mg tab [address] [name] [id number] Other [facility] Showing 1 to 27 of 27 entries

## 2024-05-29 ENCOUNTER — Other Ambulatory Visit: Payer: Self-pay

## 2024-05-29 MED ORDER — OXYCODONE HCL 10 MG PO TABS *I*
10.0000 mg | ORAL_TABLET | ORAL | 0 refills | Status: DC | PRN
Start: 2024-05-29 — End: 2024-06-12
  Filled 2024-05-29: qty 84, 14d supply, fill #0

## 2024-05-30 ENCOUNTER — Other Ambulatory Visit: Payer: Self-pay

## 2024-06-02 ENCOUNTER — Other Ambulatory Visit: Payer: Self-pay

## 2024-06-12 ENCOUNTER — Other Ambulatory Visit: Payer: Self-pay | Admitting: Primary Care

## 2024-06-15 ENCOUNTER — Other Ambulatory Visit: Payer: Self-pay

## 2024-06-15 MED ORDER — OXYCODONE HCL 10 MG PO TABS *I*
10.0000 mg | ORAL_TABLET | ORAL | 0 refills | Status: DC | PRN
Start: 2024-06-15 — End: 2024-06-25
  Filled 2024-06-15: qty 84, 14d supply, fill #0

## 2024-06-16 ENCOUNTER — Other Ambulatory Visit: Payer: Self-pay

## 2024-06-25 ENCOUNTER — Other Ambulatory Visit: Payer: Self-pay | Admitting: Primary Care

## 2024-06-25 NOTE — Telephone Encounter (Signed)
 Patient_1 06/15/2024 06/16/2024 oxycodone  hcl (ir) 10 mg tab 84 14 [name] Other [facility] PharmacPatient_2 05/29/2024 06/02/2024 oxycodone  hcl (ir) 10 mg tab 84 14 [name] Other [facility] PharmacPatient_3 05/18/2024 05/19/2024 oxycodone  hcl (ir) 10 mg tab 84 14 [name] Other [facility] PharmacPatient_4 04/29/2024 05/05/2024 oxycodone  hcl (ir) 10 mg tab 84 14 [name] Other [facility] PharmacPatient_5 04/18/2024 04/18/2024 oxycodone  hcl (ir) 10 mg tab 84 14 Lawernce Fallow Other [facility] PharmacPatient_6 04/03/2024 04/04/2024 oxycodone  hcl (ir) 10 mg tab 84 14 [name] Other [facility] PharmacPatient_7 03/20/2024 03/21/2024 oxycodone  hcl (ir) 10 mg tab 84 14 [name] Other [facility] PharmacPatient_8 03/05/2024 03/07/2024 oxycodone  hcl (ir) 10 mg tab 84 14 [name] Other [facility] PharmacPatient_9 02/20/2024 02/22/2024 oxycodone  hcl (ir) 10 mg tab 84 14 [name] Other [facility] PharmacPatient_10 02/06/2024 02/08/2024 oxycodone  hcl (ir) 10 mg tab 84 14 [name] Other [facility]

## 2024-06-30 ENCOUNTER — Other Ambulatory Visit: Payer: Self-pay

## 2024-06-30 ENCOUNTER — Encounter: Payer: Self-pay | Admitting: Primary Care

## 2024-06-30 MED ORDER — OXYCODONE HCL 10 MG PO TABS *I*
10.0000 mg | ORAL_TABLET | ORAL | 0 refills | Status: DC | PRN
Start: 2024-06-30 — End: 2024-07-09
  Filled 2024-06-30: qty 84, 14d supply, fill #0

## 2024-07-09 ENCOUNTER — Other Ambulatory Visit: Payer: Self-pay | Admitting: Primary Care

## 2024-07-09 ENCOUNTER — Other Ambulatory Visit: Payer: Self-pay

## 2024-07-09 MED ORDER — OXYCODONE HCL 10 MG PO TABS *I*
10.0000 mg | ORAL_TABLET | ORAL | 0 refills | Status: DC | PRN
Start: 2024-07-09 — End: 2024-07-23
  Filled 2024-07-09: qty 84, 14d supply, fill #0

## 2024-07-09 NOTE — Addendum Note (Signed)
 Addended by: Terran Hollenkamp M on: 07/09/2024 06:12 PM Modules accepted: Orders

## 2024-07-14 ENCOUNTER — Other Ambulatory Visit: Payer: Self-pay

## 2024-07-14 ENCOUNTER — Encounter: Payer: Self-pay | Admitting: Primary Care

## 2024-07-19 ENCOUNTER — Other Ambulatory Visit: Payer: Self-pay

## 2024-07-20 ENCOUNTER — Other Ambulatory Visit: Payer: Self-pay

## 2024-07-20 ENCOUNTER — Ambulatory Visit: Payer: MEDICAID | Admitting: Primary Care

## 2024-07-20 ENCOUNTER — Encounter: Payer: Self-pay | Admitting: Primary Care

## 2024-07-23 ENCOUNTER — Other Ambulatory Visit: Payer: Self-pay | Admitting: Primary Care

## 2024-07-24 ENCOUNTER — Other Ambulatory Visit: Payer: Self-pay

## 2024-07-24 MED ORDER — OXYCODONE HCL 10 MG PO TABS *I*
10.0000 mg | ORAL_TABLET | ORAL | 0 refills | Status: DC | PRN
Start: 2024-07-24 — End: 2024-08-03
  Filled 2024-07-24: qty 84, 14d supply, fill #0

## 2024-07-24 NOTE — Telephone Encounter (Signed)
 Patient_1 06/30/2024 06/30/2024 oxycodone  hcl (ir) 10 mg tab 84 14 Nira Leech, W Other [facility] PharmacPatient_2 06/15/2024 06/16/2024 oxycodone  hcl (ir) 10 mg tab 84 14 [name] Other [facility] PharmacPatient_3 05/29/2024 06/02/2024 oxycodone  hcl (ir) 10 mg tab 84 14 [name] Other [facility] PharmacPatient_4 05/18/2024 05/19/2024 oxycodone  hcl (ir) 10 mg tab 84 14 [name] Other [facility] PharmacPatient_5 04/29/2024 05/05/2024 oxycodone  hcl (ir) 10 mg tab 84 14 [name] Other [facility] PharmacPatient_6 04/18/2024 04/18/2024 oxycodone  hcl (ir) 10 mg tab 84 14 Lawernce Fallow Other [facility]Patient_7 04/03/2024 04/04/2024 oxycodone  hcl (ir) 10 mg tab 84 14 [name] Other [facility]Patient_8 03/20/2024 03/21/2024 oxycodone  hcl (ir) 10 mg tab 84 14 [name] Other [facility]Patient_9 03/05/2024 03/07/2024 oxycodone  hcl (ir) 10 mg tab 84 14 [name] Other [facility]Patient_10 02/20/2024 02/22/2024 oxycodone  hcl (ir) 10 mg tab 84 14 [name] Other [facility]

## 2024-07-28 ENCOUNTER — Other Ambulatory Visit: Payer: Self-pay

## 2024-08-03 ENCOUNTER — Other Ambulatory Visit: Payer: Self-pay

## 2024-08-03 ENCOUNTER — Ambulatory Visit: Payer: MEDICAID | Attending: Internal Medicine | Admitting: Internal Medicine

## 2024-08-03 ENCOUNTER — Other Ambulatory Visit
Admission: RE | Admit: 2024-08-03 | Discharge: 2024-08-03 | Disposition: A | Discharge status: Home / Self Care | Payer: MEDICAID | Source: Ambulatory Visit | Attending: Anatomic Pathology & Clinical Pathology | Admitting: Anatomic Pathology & Clinical Pathology

## 2024-08-03 VITALS — BP 128/73 | HR 90 | Temp 97.9°F | Wt 195.6 lb

## 2024-08-03 DIAGNOSIS — D571 Sickle-cell disease without crisis: Secondary | ICD-10-CM | POA: Insufficient documentation

## 2024-08-03 DIAGNOSIS — Z113 Encounter for screening for infections with a predominantly sexual mode of transmission: Secondary | ICD-10-CM | POA: Insufficient documentation

## 2024-08-03 DIAGNOSIS — G43909 Migraine, unspecified, not intractable, without status migrainosus: Secondary | ICD-10-CM | POA: Insufficient documentation

## 2024-08-03 DIAGNOSIS — Z23 Encounter for immunization: Secondary | ICD-10-CM | POA: Insufficient documentation

## 2024-08-03 LAB — PAIN CLINIC PROFILE
Amphetamine,UR: NEGATIVE
Benzodiazepinen,UR: NEGATIVE
Cocaine/Metab,UR: NEGATIVE
Opiates,UR: NEGATIVE
Oxycodone/Oxymorphone,UR: POSITIVE
THC Metabolite,UR: NEGATIVE

## 2024-08-03 LAB — DATE/TIME NOT PROVIDED

## 2024-08-03 LAB — MICROALBUMIN, URINE, RANDOM
Creatinine,UR: 115 mg/dL (ref 20–300)
Microalbumin,UR: <1.2 mg/dL

## 2024-08-03 MED ORDER — OXYCODONE HCL 10 MG PO TABS *I*
10.0000 mg | ORAL_TABLET | ORAL | 0 refills | Status: DC | PRN
  Filled 2024-08-03: qty 84, 14d supply, fill #0

## 2024-08-03 MED ORDER — PROPRANOLOL HCL 20 MG PO TABS *I*
20.0000 mg | ORAL_TABLET | Freq: Two times a day (BID) | ORAL | 3 refills | Status: AC
Start: 2024-08-03 — End: 2024-12-01
  Filled 2024-08-03: qty 60, 30d supply, fill #0

## 2024-08-03 NOTE — Progress Notes (Signed)
 [facility]ENTERADULT SICKLE CELL DISEASE PROGRAMANNUAL COMPREHENSIVE VISIT NOTEREASON FOR VISIT Sickle cell disease, type HbSS Subjective Visit completed in personSUBJECTIVE HbSS --Here for routine SCD clinic visit--Patient last seen 02/26/24 -- at that visit seen by Mardy Boatman, NP for viral URI.--In the interim since last appointment has had headaches with worsening intensity--In the past year:     -Number of ED visits: none     -Number of hospital admissions: none     -Number of infusion center visits none--Any acute pain: Yes back and head. Headaches and back pain have been worse than usual. Feels like migraines. Pain waxing and waning throughout day, usually lasts for hours to few days. Bothers at night. Frontal bilateral. Photo and phonophobia. Tried Sumatriptan , only barely works. Uses <10 days/month. Cannot take Ibuprofen . Tylenol  does not help. Laying down and relaxing to help. --Chronic daily pain: Yes, lower back --Currently on: Oxycodone  10mg  q4h prn, up to x4/day. Brings pain to manageable level. Cold makes pain worse.--Adherence to medication:  good--Side effects of medication: noregular BM. Not overally sedated.Chronic Pain Syndrome--Cameron [name] continues with chronic daily pain--Chronic pain is generally located back--Currently taking short acting oxycodone  10mg  q4h prn, generally requiring a dose 4x/day--Needs prior auth for oxycodone --SNRI/TCA therapy: no--GABAergic medication: no--Urine toxicology up to date in the past year? YesHematologic/Transfusion--On chronic transfusion therapy: No--Number of blood transfusions in the past year: last in 2024, perhaps more recently--History of iron  overload: No--History of blood transfusion reaction: No --Alloantibody formation (indicate Abs): Anti-Fya, anti-Jkb and anti-Lea  antibodies were identified in 2018Cardiopulmonary:--Dyspnea (at rest, exertional, out  of proportion to known condition, increased from baseline or unexplained): No      -Can you walk 5 blocks at a normal pace on a flat surface without becoming short of breath? Yes      -Can you climb two flights of stairs without becoming short of breath? Yes --Chest pain (at rest, exertional, out of proportion to known condition, increased from baseline or unexplained): No --Current hypoxemia (at rest, exertional, out of proportion to known condition, increased from baseline or unexplained): No --History of wheezing or increased cough: No --Prior history of recurrent hypoxemia at rest or with exertion: No --Increase in exercise limitation compared with baseline: No --Any lower extremity edema? No --Dizziness with walking/exertion? No --Orthopnea or paroxysmal nocturnal dyspnea? No --History of syncope or presyncope: No --History of PE: No, no DVT--History of recurrent acute chest syndrome: No --Evidence of sleep disordered breathing? History of memory loss, difficulty with concentration, or unexplained episodes of mental confusionRenal/Genitourinary: --Frothy urine: N/A --Hematuria: N/A --Contraception/STI prevention: not discussed, but would like STD screening, can discuss on follow-up--Diagnosis of SCD nephropathy?   No Ophthalmology--Dilated eye exam in the past year? Yes, Feb--Any vision changes? Wears glasses, due for eye doctorCNS--Prior history of overt stroke: No --Prior history of silent cerebral infarction: No --Any auditory/hearing problems: No --Trouble remembering things that have happened recently? No--Trouble recalling conversations a few days later? No--Difficulty in finding the right word or tend to use the wrong words when speaking? No--Difficulty managing money and financial affairs (e.g. paying bills, budgeting)? No--Difficulty managing his or her medication independently? No- Denies depression or mood concerns- Had MRI of  head, neurology referral done for basal ganglia hyperintensities, ok to hold if no concernsUlcerative colitis- no bloody BM- feels well controlled- Mesalamine  2400mg  BIDHaving increased frequency of migraine headaches. These are like the migraines he has had in the past and meet criteria for migraines headache. Over the past few months he is  having many days of headache each month, somewhere around 10 days up from just a couple in the past. Sumatriptan  is not helping. He can't take NSAIDs due to UC.Review of Systems Constitutional:  Negative for chills and fever. HENT:  Negative for congestion and sore throat.  Eyes:  Negative for blurred vision and double vision. Respiratory:  Negative for cough, shortness of breath and wheezing.  Cardiovascular:  Negative for chest pain and leg swelling. Gastrointestinal:  Negative for abdominal pain, nausea and vomiting. Musculoskeletal:  Negative for falls. Neurological:  Negative for sensory change and focal weakness. Psychiatric/Behavioral:  Negative for depression.   MEDICATIONS Current Outpatient Medications Medication Sig  oxyCODONE  (ROXICODONE ) 10 mg immediate release tablet Take 1 tablet (10 mg total) by mouth every 4 hours as needed for Sickle Cell Disease Pain. Max daily dose: 6 tablets (60 mg)  acetaminophen  (TYLENOL ) 500 mg tablet Take 2 tablets (1,000 mg total) by mouth 3 times daily as needed.  mesalamine  (ASACOL  HD) 800 mg tablet Take 3 tablets (2,400 mg total) by mouth 2 times daily  naloxone  (NARCAN ) 4 mg/0.1 mL nasal spray Instill 1 spray in 1 nostril once for opioid reversal. Repeat in alternating nostrils with new package every 2-3 minutes until response  SUMAtriptan  (IMITREX ) 100 mg tablet Take 1 tablet (100 mg total) by mouth as needed for Migraine. Take at onset of headache. May repeat once in 2 hours.  folic acid  (FOLVITE ) 1 mg tablet TAKE 1 TABLET (1 MG TOTAL) BY MOUTH DAILY  ferrous sulfate   325 (65 FE) mg tablet Take 1 tablet (325 mg total) by mouth every other day. With breakfast.  traZODone  (DESYREL ) 50 mg tablet Take 0.5 tablets (25 mg total) by mouth at bedtime for [employer]. Medications reviewed at today's visit Objective OBJECTIVE BP 128/73   Pulse 90   Temp 36.6 C (97.9 F) (Temporal)   Wt 88.7 kg (195 lb 9 oz)   SpO2 100%   BMI 25.80 kg/m Physical ExamGen: sitting comfortably, in no distress. Eyes: clear conjunctivae, anicteric sclerae ENT: MMMCV: RRR, normal S1S2, no m/g/r Resp: Unlabored respirations, lungs CTAB, no w/r/c GI: Round, soft, NT, ND, normoactive BS.  Skin: warm, dry, no rashNeuro: alert, oriented to person , place and situation, moving all extremities, face symmetricPsych: mood and affect appropriate    ASSESSMENT No diagnosis found.PLAN HbSS --Number of crises in the past year: 0--Number of episodes of acute chest syndrome in the past year: 0--Medication changes recommended: None, can consider hydroxyurea pending brain MRI results --Annual labs ordered: ferritin and transferrin saturation--Vitamin D  status: needs to be checked--Immunizations given:      -MenACWY (q5 years): up to date       -MenB (q2-3 years): up to date       -PCV20 (q5 years): given at today's visit       -Influenza: given at today's visit       -COVID19: deferred until 2026  Cardiopulmonary--Based on the above symptoms, ordered the following per American Society of Hematology Guidelines:     -Echocardiogram: no -If peak TRJV >= 2.5 m/sec, recommend perform 6-minute walk distance ( ) or NT-BNP [Pulm HTN referral/RHC indicated if TRJV >= 2.5 m/sec + increased NT-BNP or abnormal (6MWD)]     -PFTs: not indicated     -Sleep study ordered: needs to be discussed with him in future, should be done--RT in to see patient for incentive spirometry teaching: N/A--Hypertension (goal BP <130/80 per  ASH): No    -Medication management: N/ARenal/Genitourinary --Diagnosis  of SCD nephropathy?   N/A --ACEi/ARB indicated? No --Repeat urine albumin indicated? Yes Cerebrovascular Disease--In-depth cognitive evaluation indicated based on simplified signaling questions? No--Formal referral to a psychologist or dedicated appointment with PCP scheduled? No--MOCA administered? No--Screening MRI brain indicated?  No, already completed. (1-time screening to detect silent cerebral infarcts indicated in HbSS or HbS? only, consider addition of non-contrasted MRA)Retinopathy Screening--Retinal evaluation performed in the past year? Yes, Feb--Referral for retinal evaluation needed? No, scheduled follow-up todayTransfusion-related complications--History chronic transfusion therapy and/or ferritin > 1000 ng/ml? No--If yes, MRI (R2, T2* or R2*) for liver iron  content recommended - ASH suggests iron  overload screening by magnetic resonance imaging (MRI; R2, T2*, or R2*) for liver iron  content every 1 to 2 years- If prolonged (years), symptoms of heart failure, or exceptionally high iron  burden (LIC > 15 mg/g dw) cardiac T2* MRI is indicated )Chronic pain syndrome--Is the patient on chronic opioid therapy? YesChronic opioid therapy monitoring in SCD: Per the 2020 American Society of Hematology Guidelines: For adults and children with chronic pain from SCD who are receivingchronic opioid therapy (COT), are functioning well, and have perceived benefit, the ASH guideline panel suggests shared decision making for continuation of COT for those at high risk for aberrant opioid use or toxicity, the ASH guideline panel suggests against continuation of COT Oscar M. Gardner, C. Belvie Don, Devere Ficks, Ronisha Edwards-Elliott, Reyes Gamma, Lamar MICAEL Sprang, Chalmers Malkin, Mohamed Seisa, Jennifer Poteau, Norleen DOROTHA Bo, Donell Alvina Elsie Arvel Chaney Raynold; American  Society of Hematology [phone] guidelines for sickle cell disease: management of acute and chronic pain. Blood Adv 2020; 4 (12): Y005950. doi: paidvalue.at.[id number])--Rlmmzwu CDC opioid guidelines exclude patients with sickle cell disease. Based on ASH guidelines pain guidelines, published in 2020: 1. Optimization of SCD management is a priority.  -Current disease modifying therapy: no disease modifying threapy, discussed hydroxyurea, though pt has had no VOCs, stroke, or acute chest episodes in past, will consider further. Can readdress pending results of today's hgb electrophoresis.  -Are other disease modifying therapist with evidence for decreasing pain indicated? NA  -Are chronic transfusions indicated (ASH guideline panel suggests against chronic monthly transfusion therapy as a first-line strategy to prevent or reduce recurrent acute pain episodes)? No  -Behavioral health referral indicated? no    -Referral to physical therapy indicated? no    -Referral to pain medicine indicated? no   2. The benefit of COT in SCD is largely unknown, and the harms are established via indirect evidence. Therefore, shared decision making is essential and may lead to continuation once risks of COT and tapering are explained. Weaning and/or withdrawal from COT is potentially a higher-risk entity in patients with SCD (ie, risk of triggering vasoocclusive events or other medical complications) and should be done carefully.- During our visit, I had a discussion with shared decision making regarding the risks and benefits of continuation of chronic opioid therapy. - With MME>120mg  carry risks of endocrinopathy and those with an MME >90mg  carry risk of mortality compared to those 20mg  (in non SCD populations, data exists regarding a comparatively decreased risk of opioid overdose in SCD compared to other diseases). The risk of adverse events related to COT rises as the total dose  increases. Therefore, patients on high doses of opioids need close monitoring for complications and adverse effects.- Patient counseled to avoid driving and or using heavy machinery when taking sedating medications- Patient advised to avoid alcohol, benzodiazepines, and other sedating medications while taking COTFunctional Assessment: No concernsRisk Assessment:--Pt age <65 yrs :  Yes--Pt pregnant or at risk for pregnancy:  No--Prior history of substance abuse disorder:   No--Family history of substance abuse disorder: Unknown--Current excessive alcohol use:  No, will need to follow-up--Psychiatric comorbid conditions: No --Psychotropic medications: Trazodone  for sleep, not recently refilled. Has night shifts and some difficulty with sleep schedule.--Significant reported functional impairment from pain: NoCOMM score: not assessed (Score >/= 9 is abnormal) Monitoring:--Pain treatment agreement signed and in chart: yes, 02/03/2018--Urine toxicology up to date? Yes--Prior history of + test for Illicit drugs no, THC in 2021 and 2023 but not 09/2023--Concern aboutadherence/diversion: no--Urine Toxicology most recently collected on: 10/07/2023 -Expected substance(s) present: yes -Unexpected substance(s) present: none--Testing ordered today yes -Date and time patient took last dose of medication: todayBenefit outweighs Risk: YesOpportunity to wean opioid to a lower, but still effective, dose: NoAgreed upon Treatment goals include:  Pain control, decrease risk of long term co morbidities e.g. stroke Educated on risks or Educational material provided in AVS: YesEchocardiogram recommended: PFTs recommended: Sleep study recommended: Dyspnea(at rest or with exertion that is out of proportion to known condition, increased compared with baseline or unexplained) Snoring Chest pain (at rest or with exertion that is out of proportion to known condition,  increased compared with baseline or unexplained) Witnessed apneas or respiratory pauses Increase in exercise limitation (compared with baseline or that is unexplained by other factors, e.g. SCD pain or MSK disease) Nonrestorative sleep and/or excessive daytime sleepiness History of syncope or presyncope Obesity History of pulmonary embolism Early morning headaches History of recurrent hypoxemia at rest or with exertion Unexplained desaturation or hypoxemia during sleep, while awake, or with exertion Hypoxemia at rest or with exertion that is out of proportion to known condition, increased compared with baseline or unexplained Wheezing or increased cough at rest or with exertion or during episodes of acute URIs Carbon dioxide retention on arterial blood gas Signs of CHF and/or fluid overload on exam History of recurrent acute chest syndrome History of poorly controlled hypertension or congestive heart failure Loud P2 or unexpected/new murmur on exam Abnormal 6-minute walk test defined by either reduced or oxygen desaturation during test History of nocturnal enuresis in an older child (eg, >=75 years old) Evidence for sleep-disordered breathing with or without hypoxemia  History of recurrent priapism or frequent daytime or nocturnal vaso-occlusive pain   History of PH confirmed by right-heart catheterization   History of ischemic stroke without evidence for vasculopathy   History of memory loss, difficulty with concentration, or unexplained episodes of mental confusion   Symptoms of attention deficit-hyperactivity disorder, poor academic achievement, and performance or behavior problems in children Migraines- Propranolol 20mg  BID daily for control- Nurtec if needed for rescue- stop Sumatriptan  due to risk of vaso occlusion with SCDEye exam scheduledNo orders of the defined types were placed in this encounter.No follow-ups on file.Electronically signed by  Nathanel Dutton, MD & Donnice Malady, MDUR [facility], Phone: [phone] personally spent 60 minutes, on the calendar day of the encounter, including PreVisit, Visit, and Post visit work, in the care of this patient.

## 2024-08-04 LAB — SYPHILIS SCREEN
Syphilis Screen: NEGATIVE
Syphilis Status: NONREACTIVE

## 2024-08-04 LAB — CHLAMYDIA NAAT (PCR): Chlamydia NAAT (PCR): NEGATIVE

## 2024-08-04 LAB — TIBC
Iron: 29 ug/dL — ABNORMAL LOW (ref 45–170)
TIBC: 436 ug/dL (ref 250–450)
Transferrin Saturation: 7 % — ABNORMAL LOW (ref 20–55)

## 2024-08-04 LAB — N. GONORRHOEAE NAAT (PCR): N. gonorrhoeae NAAT (PCR): NEGATIVE

## 2024-08-04 LAB — TRANSFERRIN: Transferrin: 323 mg/dL (ref 200–360)

## 2024-08-04 LAB — FERRITIN: Ferritin: 20 ng/mL (ref 20–250)

## 2024-08-04 LAB — CONFIRM OPIATES: Confirm Opiates: POSITIVE

## 2024-08-05 LAB — HEMOGLOBIN ELECTROPHORESIS
HGB F: 7.3 % — ABNORMAL HIGH (ref 0.0–1.9)
Hgb A2: 3 % (ref 2.2–3.2)
Hgb S: 89.7 %
Interp,HBE: ABNORMAL

## 2024-08-05 LAB — HGB ELECT,REVIEW

## 2024-08-06 ENCOUNTER — Encounter: Payer: Self-pay | Admitting: Primary Care

## 2024-08-06 ENCOUNTER — Ambulatory Visit: Payer: Self-pay | Admitting: Internal Medicine

## 2024-08-06 NOTE — Telephone Encounter (Signed)
 07/24/2024 07/28/2024 oxycodone  hcl (ir) 10 mg tab 84 14 [name] Other [facility] Patient_110/03/2024 06/30/2024 oxycodone  hcl (ir) 10 mg tab 84 14 Nira Leech, W Other [facility] Patient_209/22/2025 06/16/2024 oxycodone  hcl (ir) 10 mg tab 84 14 [name] Other [facility] Patient_309/01/2024 06/02/2024 oxycodone  hcl (ir) 10 mg tab 84 14 [name] Other [facility] Patient_408/25/2025 05/19/2024 oxycodone  hcl (ir) 10 mg tab 84 14 [name] Other [facility] Patient_508/02/2024 05/05/2024 oxycodone  hcl (ir) 10 mg tab 84 14 [name] Other [facility] Patient_607/26/2025 04/18/2024 oxycodone  hcl (ir) 10 mg tab 84 14 Lawernce Fallow Other [facility] Patient_707/07/2024 04/04/2024 oxycodone  hcl (ir) 10 mg tab 84 14 [name] Other [facility] Patient_806/27/2025 03/21/2024 oxycodone  hcl (ir) 10 mg tab 84 14 [name] Other [facility] Patient_906/08/2024 03/07/2024 oxycodone  hcl (ir) 10 mg tab 84 14 [name] Other [facility] Patient_10

## 2024-08-06 NOTE — Telephone Encounter (Signed)
 I reviewed with Dr Lawernce and we recommend Hydromorphone  0.75 .mg x3

## 2024-08-06 NOTE — Telephone Encounter (Signed)
 [facility] DEFERRED INFUSION REQUEST:Patient name:  Cameron [name] Today's date:  08/06/24 Treatment plan needs:  IVF/painIV access needs:  PIVNext available [facility]:  Date range for request (within 48hrs of request):  11/14-11/17Deferred infusion location/date/time:  11/18 8am.Please inform [facility] RN if appointment at deferred location is outside of the 48hrs timeframe to allow for booking within [facility].

## 2024-08-07 ENCOUNTER — Other Ambulatory Visit: Payer: Self-pay | Admitting: Student in an Organized Health Care Education/Training Program

## 2024-08-07 ENCOUNTER — Ambulatory Visit: Payer: MEDICAID

## 2024-08-07 DIAGNOSIS — D564 Hereditary persistence of fetal hemoglobin [HPFH]: Secondary | ICD-10-CM | POA: Insufficient documentation

## 2024-08-07 DIAGNOSIS — D571 Sickle-cell disease without crisis: Secondary | ICD-10-CM | POA: Insufficient documentation

## 2024-08-07 DIAGNOSIS — D5700 Hb-SS disease with crisis, unspecified: Secondary | ICD-10-CM | POA: Insufficient documentation

## 2024-08-07 NOTE — Progress Notes (Addendum)
 [facility] CenterVisit Note REASON FOR VISIT  CC: Sickle cell disease painVideo Visit Mode of Communication: VideoLocation of Patient: HomeLocation of Telemedicine Provider: ClinicConsent was obtained from the patient to complete this telemedicine visit; including the potential for financial liability.PRIMARY DIAGNOSIS  HbSS with HPFHSUBJECTIVE  HbSS with HPFH PainLucius is here for evaluation of acute pain due to sickle cell disease. Pain started approximately ~1 month agoPain location: back and all overPain keeping him up at nightPain Score: 8/10Home medication regimen:Long acting: noneShort acting oxycodone  10mg  q4h PRN, currently requiring a dose 6-8x/day - completely out, next refill available 11/18/2025SNRI/TCA therapy: noneGABAergic medication: noneAcetaminophen: 1000 mg TID PRNNSAID: contraindicatedPain is not controlled with home pain regimen.Chest pain: NoDyspnea: NoLight headedness: No  Review of Systems as per HPI abovePast Medical History, Social History, Family History, and Medications/allergies reviewed during this visitCurrent Outpatient Medications Medication  propranolol (INDERAL) 20 mg tablet  oxyCODONE  (ROXICODONE ) 10 mg immediate release tablet  acetaminophen  (TYLENOL ) 500 mg tablet  mesalamine  (ASACOL  HD) 800 mg tablet  naloxone  (NARCAN ) 4 mg/0.1 mL nasal spray  folic acid  (FOLVITE ) 1 mg tablet  ferrous sulfate  325 (65 FE) mg tablet  traZODone  (DESYREL ) 50 mg tablet No current facility-administered medications for this visit. OBJECTIVE  There were no vitals taken for this visit. - unable to obtain due to video visitThe physical exam was limited to my video observation of this patient's organ systems and/or body areas. Gen: comfortable, in no distressEye: clear conjunctivae, anicteric scleraeENT: MMM Resp: breathing comfortably  ASSESSMENT /  DIAGNOSIS HbSS with HPFHVaso-occlusive crisisPatient presenting with severe pain secondary to vaso-occlusive crisis. They have failed home opioid therapy, though continue oxycodone  10 mg q4h PRNRecommend an appointment in our infusion center for urgent administration of intravenous opioids and fluids, which I have ordered and will oversee.  Additional coverage may be provided by our on-site providers. Evidence indicates that use of infusion centers therapy decreases risk of hospitalization.Recommend ED evaluation due to severe pain and need for urgent treatment if no infusion center appointments available.Planning [facility] later today after getting some rest after working night shiftAdvised to call 911 if he develops chest pain, dyspnea, or intolerable painPain management treatment plan updated in Patient Care Coordination Note 08/07/2024 by Wilhemenia Brightly, MDThis visit required high complexity medical decision making due to: severe exacerbation of chronic illnessElectronically signed by Wilhemenia Brightly, MD, 08/07/2024, 8:56 AMUR [facility], Phone: [phone]

## 2024-08-11 ENCOUNTER — Other Ambulatory Visit: Payer: Self-pay

## 2024-08-11 DIAGNOSIS — D571 Sickle-cell disease without crisis: Secondary | ICD-10-CM

## 2024-08-19 ENCOUNTER — Other Ambulatory Visit: Payer: Self-pay

## 2024-08-19 DIAGNOSIS — D571 Sickle-cell disease without crisis: Secondary | ICD-10-CM

## 2024-08-19 NOTE — Telephone Encounter (Signed)
 07/09/2024 07/14/2024 oxycodone  hcl (ir) 10 mg tab 84 14 [name] HERO MD Other [name] Patient_111/06/2024 08/11/2024 oxycodone  hcl (ir) 10 mg tab 84 14 [facility] Other [name] Patient_210/31/2025 07/28/2024 oxycodone  hcl (ir) 10 mg tab 84 14 [name] HERO MD Other [name] Patient_310/03/2024 06/30/2024 oxycodone  hcl (ir) 10 mg tab 84 14 Cameron [name], W Other [name] Patient_409/22/2025 06/16/2024 oxycodone  hcl (ir) 10 mg tab 84 14 [name] HERO MD Other [name] Patient_509/01/2024 06/02/2024 oxycodone  hcl (ir) 10 mg tab 84 14 [name] HERO MD Other [name] Patient_608/25/2025 05/19/2024 oxycodone  hcl (ir) 10 mg tab 84 14 [name] HERO MD Other [name] Patient_708/02/2024 05/05/2024 oxycodone  hcl (ir) 10 mg tab 84 14 [name] HERO MD Other [name] Patient_807/26/2025 04/18/2024 oxycodone  hcl (ir) 10 mg tab 84 14 Cameron [name] Other [name] Patient_907/07/2024 04/04/2024 oxycodone  hcl (ir) 10 mg tab 84 14 [name] HERO MD Other [name] Patient_10

## 2024-08-24 ENCOUNTER — Other Ambulatory Visit: Payer: Self-pay

## 2024-08-24 DIAGNOSIS — D571 Sickle-cell disease without crisis: Secondary | ICD-10-CM

## 2024-08-24 MED ORDER — OXYCODONE HCL 10 MG PO TABS *I*
10.0000 mg | ORAL_TABLET | ORAL | 0 refills | Status: DC | PRN
  Filled 2024-08-24: qty 84, 14d supply, fill #0

## 2024-08-28 ENCOUNTER — Encounter: Payer: Self-pay | Admitting: Primary Care

## 2024-08-28 DIAGNOSIS — D509 Iron deficiency anemia, unspecified: Secondary | ICD-10-CM

## 2024-09-01 ENCOUNTER — Other Ambulatory Visit: Payer: Self-pay

## 2024-09-01 MED ORDER — FERROUS SULFATE 325 (65 FE) MG PO TABS *WRAPPED* *I*
325.0000 mg | ORAL_TABLET | ORAL | 2 refills | Status: AC
  Filled 2024-09-01: qty 15, 30d supply, fill #0
  Filled 2024-10-01: qty 15, 30d supply, fill #1

## 2024-09-01 NOTE — Addendum Note (Signed)
 Addended by: [name] on: 09/01/2024 05:05 PM Modules accepted: Orders

## 2024-09-03 ENCOUNTER — Other Ambulatory Visit: Payer: Self-pay | Admitting: Primary Care

## 2024-09-03 ENCOUNTER — Other Ambulatory Visit: Payer: Self-pay

## 2024-09-03 DIAGNOSIS — D571 Sickle-cell disease without crisis: Secondary | ICD-10-CM

## 2024-09-03 MED ORDER — OXYCODONE HCL 10 MG PO TABS *I*
10.0000 mg | ORAL_TABLET | ORAL | 0 refills | Status: DC | PRN
  Filled 2024-09-03: qty 84, 14d supply, fill #0

## 2024-09-03 NOTE — Telephone Encounter (Signed)
 08/24/2024 08/24/2024 oxycodone  hcl (ir) 10 mg tab 84 14 Cameron Elspeth HERO MD Other [name] Patient_311/06/2024 08/11/2024 oxycodone  hcl (ir) 10 mg tab 84 14 [facility] Other [name] Patient_410/31/2025 07/28/2024 oxycodone  hcl (ir) 10 mg tab 84 14 Cameron Elspeth HERO MD Other [name] Patient_510/16/2025 07/14/2024 oxycodone  hcl (ir) 10 mg tab 84 14 Cameron Elspeth HERO MD Other [name] Patient_110/03/2024 06/30/2024 oxycodone  hcl (ir) 10 mg tab 84 14 Cameron [name], W Other [name] Patient_609/22/2025 06/16/2024 oxycodone  hcl (ir) 10 mg tab 84 14 Cameron Elspeth HERO MD Other [name] Patient_709/01/2024 06/02/2024 oxycodone  hcl (ir) 10 mg tab 84 14 Cameron Elspeth HERO MD Other [name] Patient_808/25/2025 05/19/2024 oxycodone  hcl (ir) 10 mg tab 84 14 Cameron Elspeth HERO MD Other [name] Patient_908/02/2024 05/05/2024 oxycodone  hcl (ir) 10 mg tab 84 14 Cameron Elspeth HERO MD Other [name] Patient_1007/31/2025 04/23/2024 oxycodone  hcl (ir) 10 mg tab 18 3 Cameron [name], Cameron [name] [phone] Patient_2

## 2024-09-07 ENCOUNTER — Other Ambulatory Visit: Payer: Self-pay

## 2024-09-09 ENCOUNTER — Telehealth: Payer: Self-pay | Admitting: Primary Care

## 2024-09-09 NOTE — Telephone Encounter (Signed)
 Imaging Appointment

## 2024-09-16 ENCOUNTER — Other Ambulatory Visit: Payer: Self-pay

## 2024-09-16 ENCOUNTER — Other Ambulatory Visit: Payer: Self-pay | Admitting: Primary Care

## 2024-09-16 MED ORDER — OXYCODONE HCL 10 MG PO TABS *I*
10.0000 mg | ORAL_TABLET | ORAL | 0 refills | Status: DC | PRN
  Filled 2024-09-16: qty 84, 14d supply, fill #0

## 2024-09-16 NOTE — Telephone Encounter (Signed)
 RX Written RX Dispensed Drug Quantity Days Supply Prescriber Name Payment Method Dispenser PDI12/07/2024 09/07/2024 oxycodone  hcl (ir) 10 mg tab 84 14 [name] Other [facility] Patient_112/09/2023 08/24/2024 oxycodone  hcl (ir) 10 mg tab 84 14 [name] Other [facility] Patient_211/06/2024 08/11/2024 oxycodone  hcl (ir) 10 mg tab 84 14 [facility] Other [facility] Patient_310/31/2025 07/28/2024 oxycodone  hcl (ir) 10 mg tab 84 14 [name] Other [facility] Patient_410/16/2025 07/14/2024 oxycodone  hcl (ir) 10 mg tab 84 14 [name] Other [facility] Patient_2610/03/2024 06/30/2024 oxycodone  hcl (ir) 10 mg tab 84 14 Nira Leech, W Other [facility] Patient_509/22/2025 06/16/2024 oxycodone  hcl (ir) 10 mg tab 84 14 [name] Other [facility] Patient_609/01/2024 06/02/2024 oxycodone  hcl (ir) 10 mg tab 84 14 [name] Other [facility] Patient_708/25/2025 05/19/2024 oxycodone  hcl (ir) 10 mg tab 84 14 [name] Other [facility] Patient_808/02/2024 05/05/2024 oxycodone  hcl (ir) 10 mg tab 84 14 [name] Other [facility] Patient_907/31/2025 04/23/2024 oxycodone  hcl (ir) 10 mg tab 18 3 Fue[facility], [facility] [phone] Patient_2707/26/2025 04/18/2024 oxycodone  hcl (ir) 10 mg tab 84 14 Lawernce Fallow Other [facility] Patient_1007/07/2024 04/04/2024 oxycodone  hcl (ir) 10 mg tab 84 14 [name] Other [facility] Patient_1106/27/2025 03/21/2024 oxycodone  hcl (ir) 10 mg tab 84 14 [name] Other [facility] Patient_1206/08/2024 03/07/2024 oxycodone  hcl (ir) 10 mg tab 84 14 [name] Other [facility] Patient_1305/29/2025 02/22/2024 oxycodone  hcl (ir) 10 mg tab 84 14 [name] Other [facility] Patient_1405/15/2025 02/08/2024 oxycodone  hcl (ir) 10 mg tab 84 14 [name] Other [facility] Patient_1505/09/2023 01/25/2024 oxycodone  hcl (ir) 10 mg tab 84 14 [name] Other [facility] Patient_1604/16/2025 01/11/2024 oxycodone  hcl (ir) 10 mg tab 84 14 Good, Erin Other [facility] Patient_1704/10/2023 12/28/2023 oxycodone  hcl (ir) 10 mg tab 84 14 [name] Other [facility] Patient_1803/21/2025 12/14/2023 oxycodone  hcl (ir) 10 mg tab 84 14 Cannarozzo, Kara Other [facility] Patient_1903/02/2024 11/30/2023 oxycodone  hcl (ir) 10 mg tab 84 14 [name] Other [facility] Patient_2002/21/2025 11/16/2023 oxycodone  hcl (ir) 10 mg tab 84 14 [name] Other [facility] Patient_2102/01/2024 11/02/2023 oxycodone  hcl (ir) 10 mg tab 84 14 [name] Other [facility] Patient_2201/24/2025 10/19/2023 oxycodone  hcl (ir) 10 mg tab 84 14 [name] Other [facility] Patient_2301/05/2024 10/05/2023 oxycodone  hcl (ir) 10 mg tab 84 14 Nira Leech, W Other [facility] Patient_2412/26/2024 09/21/2023 oxycodone  hcl (ir) 10 mg tab 84 14 [name] Other [facility] Patient_25

## 2024-09-21 ENCOUNTER — Other Ambulatory Visit: Payer: Self-pay

## 2024-09-30 ENCOUNTER — Ambulatory Visit: Payer: MEDICAID

## 2024-10-01 ENCOUNTER — Other Ambulatory Visit: Payer: Self-pay | Admitting: Primary Care

## 2024-10-01 ENCOUNTER — Other Ambulatory Visit: Payer: Self-pay

## 2024-10-01 NOTE — Telephone Encounter (Signed)
 09/03/2024 09/07/2024 oxycodone  hcl (ir) 10 mg tab 84 14 [name] Other [facility] Patient_112/09/2023 08/24/2024 oxycodone  hcl (ir) 10 mg tab 84 14 [name] Other [facility] Patient_211/06/2024 08/11/2024 oxycodone  hcl (ir) 10 mg tab 84 14 [facility] Other [facility] Patient_310/31/2025 07/28/2024 oxycodone  hcl (ir) 10 mg tab 84 14 [name] Other [facility] Patient_410/03/2024 06/30/2024 oxycodone  hcl (ir) 10 mg tab 84 14 Nira Leech, W Other [facility] Patient_509/22/2025 06/16/2024 oxycodone  hcl (ir) 10 mg tab 84 14 [name] Other [facility] Patient_609/01/2024 06/02/2024 oxycodone  hcl (ir) 10 mg tab 84 14 [name] Other [facility] Patient_708/25/2025 05/19/2024 oxycodone  hcl (ir) 10 mg tab 84 14 [name] Other [facility] Patient_808/02/2024 05/05/2024 oxycodone  hcl (ir) 10 mg tab 84 14 [name] Other [facility] Patient_907/26/2025 04/18/2024 oxycodone  hcl (ir) 10 mg tab 84 14 Lawernce Fallow Other [facility] Patient_10

## 2024-10-02 ENCOUNTER — Other Ambulatory Visit: Payer: Self-pay

## 2024-10-02 MED ORDER — OXYCODONE HCL 10 MG PO TABS *I*
10.0000 mg | ORAL_TABLET | ORAL | 0 refills | Status: DC | PRN
  Filled 2024-10-02: qty 84, 14d supply, fill #0

## 2024-10-04 ENCOUNTER — Emergency Department
Admission: EM | Admit: 2024-10-04 | Discharge: 2024-10-04 | Discharge status: Against Medical Advice | Source: Ambulatory Visit | Attending: Emergency Medicine | Admitting: Emergency Medicine

## 2024-10-04 DIAGNOSIS — Z789 Other specified health status: Secondary | ICD-10-CM

## 2024-10-04 DIAGNOSIS — Z5321 Procedure and treatment not carried out due to patient leaving prior to being seen by health care provider: Secondary | ICD-10-CM | POA: Insufficient documentation

## 2024-10-04 NOTE — ED Triage Notes (Signed)
 Pt form home via EMS. Started feeling dizzy since last night, vomited a few times, head heaviness. Hx sickle cell anemia and colitis. Blood Glucose Meter (mg/dl): 135IV Solution: Normal salineI.V.: 400 mLPrehospital medications given: YesAnti-Emetics: Zofran  (4mg )

## 2024-10-05 ENCOUNTER — Other Ambulatory Visit: Payer: Self-pay

## 2024-10-11 ENCOUNTER — Other Ambulatory Visit: Payer: Self-pay

## 2024-10-13 NOTE — Telephone Encounter (Signed)
 Will check in 1 week if appointment has been rescheduled

## 2024-10-15 ENCOUNTER — Other Ambulatory Visit: Payer: Self-pay | Admitting: Student in an Organized Health Care Education/Training Program

## 2024-10-15 NOTE — Telephone Encounter (Signed)
 RX Written RX Dispensed Drug Quantity Days Supply Prescriber Name Payment Method Dispenser PDI01/05/2025 10/05/2024 oxycodone  hcl (ir) 10 mg tab 84 14 Lawernce Fallow Other [facility] Patient_412/24/2025 09/21/2024 oxycodone  hcl (ir) 10 mg tab 84 14 [name] Other [facility] Patient_112/07/2024 09/07/2024 oxycodone  hcl (ir) 10 mg tab 84 14 [name] Other [facility] Patient_512/09/2023 08/24/2024 oxycodone  hcl (ir) 10 mg tab 84 14 [name] Other [facility] Patient_611/06/2024 08/11/2024 oxycodone  hcl (ir) 10 mg tab 84 14 [facility] Other [facility] Patient_710/31/2025 07/28/2024 oxycodone  hcl (ir) 10 mg tab 84 14 [name] Other [facility] Patient_810/16/2025 07/14/2024 oxycodone  hcl (ir) 10 mg tab 84 14 [name] Other [facility] Patient_210/03/2024 06/30/2024 oxycodone  hcl (ir) 10 mg tab 84 14 Nira Leech, W Other [facility] Patient_909/22/2025 06/16/2024 oxycodone  hcl (ir) 10 mg tab 84 14 [name] Other [facility] Patient_1009/01/2024 06/02/2024 oxycodone  hcl (ir) 10 mg tab 84 14 [name] Other [facility] Patient_1108/25/2025 05/19/2024 oxycodone  hcl (ir) 10 mg tab 84 14 [name] Other [facility] Patient_1208/02/2024 05/05/2024 oxycodone  hcl (ir) 10 mg tab 84 14 [name] Other [facility] Patient_1307/31/2025 04/23/2024 oxycodone  hcl (ir) 10 mg tab 18 3 [facility], [facility] [phone] Patient_307/26/2025 04/18/2024 oxycodone  hcl (ir) 10 mg tab 84 14 Lawernce Fallow Other [facility] Patient_1407/07/2024 04/04/2024 oxycodone  hcl (ir) 10 mg tab 84 14 [name] Other [facility] Patient_1506/27/2025 03/21/2024 oxycodone  hcl (ir) 10 mg tab 84 14 [name] Other [facility] Patient_1606/08/2024 03/07/2024 oxycodone  hcl (ir) 10 mg tab 84 14 [name] Other [facility] Patient_1705/29/2025 02/22/2024 oxycodone  hcl (ir) 10 mg tab 84 14 [name] Other [facility] Patient_1805/15/2025 02/08/2024 oxycodone  hcl (ir) 10 mg tab 84 14 [name] Other [facility] Patient_1905/09/2023 01/25/2024 oxycodone  hcl (ir) 10 mg tab 84 14 [name] Other [facility] Patient_2004/16/2025 01/11/2024 oxycodone  hcl (ir) 10 mg tab 84 14 Good, Erin Other [facility] Patient_2104/10/2023 12/28/2023 oxycodone  hcl (ir) 10 mg tab 84 14 [name] Other [facility] Patient_2203/21/2025 12/14/2023 oxycodone  hcl (ir) 10 mg tab 84 14 Cannarozzo, Kara Other [facility] Patient_2303/02/2024 11/30/2023 oxycodone  hcl (ir) 10 mg tab 84 14 [name] Other [facility] Patient_2402/21/2025 11/16/2023 oxycodone  hcl (ir) 10 mg tab 84 14 [name] Other [facility] Patient_2502/01/2024 11/02/2023 oxycodone  hcl (ir) 10 mg tab 84 14 [name] Other [facility] Patient_2601/24/2025 10/19/2023 oxycodone  hcl (ir) 10 mg tab 84 14 [name] Other [facility] Patient_27

## 2024-10-15 NOTE — Progress Notes (Unsigned)
 Retina Service NoteReferred by Mardy Keene Boatman, NP PCP: ELSPETH FRET, MD Patient name: Cameron [name]  Medical Record Number: [id number] DOB: Nov 13, 1995  Age: 29 y.o. Date: 1/22/2026Subjective:   1/23/2026Specialty Problems  None Past Medical History[1]Past Surgical History[2]Family History[3] Social History Socioeconomic History  Marital status: Single Tobacco Use  Smoking status: Never  Smokeless tobacco: Never  Tobacco comments:   Marijuana smoker every night.  Substance and Sexual Activity  Alcohol use: Never  Drug use: Yes   Types: Marijuana  Sexual activity: Yes   Partners: Female   Birth control/protection: Implant Social History Narrative  06/09/15- Lives at home with mom, brother (16 yrs, 11 yrs), sister (21 yrs) and nephew (4 yrs).  Ophthalmic medications:No current outpatient medications on file. (Ophthalmic) Current Outpatient Medications (Other) Medication Sig Dispense Refill  oxyCODONE  (ROXICODONE ) 10 mg immediate release tablet Take 1 tablet (10 mg total) by mouth every 4 hours as needed for Sickle Cell Disease Pain. Max daily dose: 6 tablets (60 mg) 84 tablet 0  ferrous sulfate  325 (65 FE) mg tablet Take 1 tablet (325 mg total) by mouth every other day. With breakfast. 15 tablet 2  propranolol  (INDERAL ) 20 mg tablet Take 1 tablet (20 mg total) by mouth 2 times daily. 60 tablet 3  acetaminophen  (TYLENOL ) 500 mg tablet Take 2 tablets (1,000 mg total) by mouth 3 times daily as needed. 100 tablet 5  mesalamine  (ASACOL  HD) 800 mg tablet Take 3 tablets (2,400 mg total) by mouth 2 times daily 180 tablet 5  naloxone  (NARCAN ) 4 mg/0.1 mL nasal spray Instill 1 spray in 1 nostril once for opioid reversal. Repeat in alternating nostrils with new package every 2-3 minutes until response 2 each 5  traZODone  (DESYREL ) 50 mg tablet Take 0.5 tablets (25 mg total) by mouth at bedtime for  [employer]. 15 tablet 5 Allergies: Allergies: No Known Allergies (drug, envir, food or latex)  Objective:   There were no vitals filed for this visit.Not recorded Ophthalmic Testing and Imaging: Assessment and Plan:  # Sickle cell retinopathy, {Any mac edema:48213::With Macular Edema}, {Laterality of Eye:48219::Both Eyes}History of first diagnosis ***Prior anti-VEGF injections ***Prior laser treatment ***Reviewed risks/benefits/alternatives to treatment with laser, injections, and observationRecommend continued follow-up for re-evaluation. On the follow up evaluation if there is macular edema on clinical exam and/or thickening on the OCT then re-injection will be considered otherwise I will continue to evaluate it on a regular basis. If the patient does not respond to intravitreal anti-VEGF therapy, then grid laser photocoagulation and/or intravitreal steroids will be considered. Return sooner if any visual changes, pain or worsening of condition. Patient elects to {BRVOtreatment:48223::observe and monitor for now} Follow up: Dr. Breazzano for {choicesfollowup:48234::clinic evaluation} in {0-10 NA:37112} {DAYS/WEEKS/MONTHS:36459} with {Returnvisitchoicesprocedures:48242::Optos and FAF OU, OCT Mac OU}Letter to ***  Mark P. 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

## 2024-10-16 ENCOUNTER — Ambulatory Visit: Admitting: Ophthalmology

## 2024-10-16 ENCOUNTER — Other Ambulatory Visit: Payer: Self-pay

## 2024-10-16 MED ORDER — OXYCODONE HCL 10 MG PO TABS *I*
10.0000 mg | ORAL_TABLET | ORAL | 0 refills | Status: AC | PRN
  Filled 2024-10-16: qty 84, 14d supply, fill #0

## 2024-10-19 ENCOUNTER — Other Ambulatory Visit: Payer: Self-pay

## 2024-10-20 ENCOUNTER — Other Ambulatory Visit: Payer: Self-pay

## 2024-10-23 NOTE — Telephone Encounter (Signed)
 MRA head without contrast was discontinued due to canceled appointment showing: No Show and patient has not rescheduled after 2 additional attempts to have patient reschedule

## 2024-10-24 LAB — EKG 12-LEAD
P: 24 deg
PR: 134 ms
QRS: 10 deg
QRSD: 92 ms
QT: 391 ms
QTc: 427 ms
Rate: 71 {beats}/min
T: -4 deg

## 2024-10-27 ENCOUNTER — Encounter: Payer: Self-pay | Admitting: Ophthalmology

## 2024-11-02 ENCOUNTER — Ambulatory Visit: Payer: MEDICAID
